# Patient Record
Sex: Male | Born: 1965
Health system: Southern US, Community
[De-identification: ages and names within clinical notes are randomized; demographics above are authoritative.]

## PROBLEM LIST (undated history)

## (undated) DIAGNOSIS — I10 Essential (primary) hypertension: Secondary | ICD-10-CM

## (undated) DIAGNOSIS — N189 Chronic kidney disease, unspecified: Secondary | ICD-10-CM

## (undated) DIAGNOSIS — E11610 Type 2 diabetes mellitus with diabetic neuropathic arthropathy: Secondary | ICD-10-CM

## (undated) DIAGNOSIS — G709 Myoneural disorder, unspecified: Secondary | ICD-10-CM

## (undated) DIAGNOSIS — M109 Gout, unspecified: Secondary | ICD-10-CM

## (undated) DIAGNOSIS — G4730 Sleep apnea, unspecified: Secondary | ICD-10-CM

## (undated) DIAGNOSIS — K859 Acute pancreatitis without necrosis or infection, unspecified: Secondary | ICD-10-CM

## (undated) DIAGNOSIS — E669 Obesity, unspecified: Secondary | ICD-10-CM

## (undated) DIAGNOSIS — N200 Calculus of kidney: Secondary | ICD-10-CM

## (undated) DIAGNOSIS — J40 Bronchitis, not specified as acute or chronic: Secondary | ICD-10-CM

## (undated) DIAGNOSIS — J45909 Unspecified asthma, uncomplicated: Secondary | ICD-10-CM

## (undated) DIAGNOSIS — E1142 Type 2 diabetes mellitus with diabetic polyneuropathy: Secondary | ICD-10-CM

## (undated) HISTORY — DX: Myoneural disorder, unspecified: G70.9

## (undated) HISTORY — PX: NO PAST SURGERIES: SHX2092

## (undated) HISTORY — DX: Unspecified asthma, uncomplicated: J45.909

---

## 1999-01-20 ENCOUNTER — Emergency Department (HOSPITAL_COMMUNITY): Admission: EM | Admit: 1999-01-20 | Discharge: 1999-01-20 | Payer: Self-pay | Admitting: Emergency Medicine

## 1999-06-02 ENCOUNTER — Emergency Department (HOSPITAL_COMMUNITY): Admission: EM | Admit: 1999-06-02 | Discharge: 1999-06-02 | Payer: Self-pay | Admitting: Internal Medicine

## 2000-08-02 ENCOUNTER — Emergency Department (HOSPITAL_COMMUNITY): Admission: EM | Admit: 2000-08-02 | Discharge: 2000-08-02 | Payer: Self-pay | Admitting: Emergency Medicine

## 2000-08-02 ENCOUNTER — Encounter: Payer: Self-pay | Admitting: Emergency Medicine

## 2002-08-25 ENCOUNTER — Emergency Department (HOSPITAL_COMMUNITY): Admission: EM | Admit: 2002-08-25 | Discharge: 2002-08-25 | Payer: Self-pay | Admitting: Emergency Medicine

## 2002-08-25 ENCOUNTER — Encounter: Payer: Self-pay | Admitting: Emergency Medicine

## 2003-07-01 ENCOUNTER — Emergency Department (HOSPITAL_COMMUNITY): Admission: EM | Admit: 2003-07-01 | Discharge: 2003-07-01 | Payer: Self-pay | Admitting: Emergency Medicine

## 2004-04-01 ENCOUNTER — Emergency Department (HOSPITAL_COMMUNITY): Admission: EM | Admit: 2004-04-01 | Discharge: 2004-04-01 | Payer: Self-pay | Admitting: Emergency Medicine

## 2005-01-06 ENCOUNTER — Emergency Department (HOSPITAL_COMMUNITY): Admission: EM | Admit: 2005-01-06 | Discharge: 2005-01-06 | Payer: Self-pay | Admitting: Emergency Medicine

## 2009-02-22 ENCOUNTER — Emergency Department (HOSPITAL_COMMUNITY): Admission: EM | Admit: 2009-02-22 | Discharge: 2009-02-22 | Payer: Self-pay | Admitting: Emergency Medicine

## 2009-10-22 ENCOUNTER — Observation Stay (HOSPITAL_COMMUNITY): Admission: EM | Admit: 2009-10-22 | Discharge: 2009-10-23 | Payer: Self-pay | Admitting: Emergency Medicine

## 2009-10-22 ENCOUNTER — Emergency Department (HOSPITAL_COMMUNITY): Admission: EM | Admit: 2009-10-22 | Discharge: 2009-10-22 | Payer: Self-pay | Admitting: Family Medicine

## 2010-02-28 ENCOUNTER — Emergency Department (HOSPITAL_COMMUNITY): Admission: EM | Admit: 2010-02-28 | Discharge: 2010-02-28 | Payer: Self-pay | Admitting: Emergency Medicine

## 2010-06-27 ENCOUNTER — Emergency Department (HOSPITAL_COMMUNITY): Admission: EM | Admit: 2010-06-27 | Discharge: 2010-06-27 | Payer: Self-pay | Admitting: Family Medicine

## 2010-10-20 LAB — CBC
HCT: 35.4 % — ABNORMAL LOW (ref 39.0–52.0)
Hemoglobin: 12.3 g/dL — ABNORMAL LOW (ref 13.0–17.0)
Platelets: 215 10*3/uL (ref 150–400)
RBC: 3.97 MIL/uL — ABNORMAL LOW (ref 4.22–5.81)
WBC: 18 10*3/uL — ABNORMAL HIGH (ref 4.0–10.5)

## 2010-10-20 LAB — BASIC METABOLIC PANEL
BUN: 14 mg/dL (ref 6–23)
Potassium: 4 mEq/L (ref 3.5–5.1)

## 2010-10-20 LAB — DIFFERENTIAL
Eosinophils Absolute: 0.1 10*3/uL (ref 0.0–0.7)
Lymphocytes Relative: 14 % (ref 12–46)
Lymphs Abs: 2.5 10*3/uL (ref 0.7–4.0)

## 2010-10-20 LAB — GLUCOSE, CAPILLARY: Glucose-Capillary: 277 mg/dL — ABNORMAL HIGH (ref 70–99)

## 2011-01-22 ENCOUNTER — Emergency Department (HOSPITAL_COMMUNITY)
Admission: EM | Admit: 2011-01-22 | Discharge: 2011-01-22 | Disposition: A | Discharge status: Home / Self Care | Payer: 59 | Attending: Emergency Medicine | Admitting: Emergency Medicine

## 2011-01-22 ENCOUNTER — Emergency Department (HOSPITAL_COMMUNITY): Payer: 59

## 2011-01-22 ENCOUNTER — Ambulatory Visit (INDEPENDENT_AMBULATORY_CARE_PROVIDER_SITE_OTHER): Payer: 59

## 2011-01-22 ENCOUNTER — Inpatient Hospital Stay (INDEPENDENT_AMBULATORY_CARE_PROVIDER_SITE_OTHER)
Admission: RE | Admit: 2011-01-22 | Discharge: 2011-01-22 | Disposition: A | Discharge status: Home / Self Care | Payer: 59 | Source: Ambulatory Visit | Attending: Family Medicine | Admitting: Family Medicine

## 2011-01-22 DIAGNOSIS — R079 Chest pain, unspecified: Secondary | ICD-10-CM

## 2011-01-22 DIAGNOSIS — R0789 Other chest pain: Secondary | ICD-10-CM | POA: Insufficient documentation

## 2011-01-22 DIAGNOSIS — I10 Essential (primary) hypertension: Secondary | ICD-10-CM | POA: Insufficient documentation

## 2011-01-22 DIAGNOSIS — E1169 Type 2 diabetes mellitus with other specified complication: Secondary | ICD-10-CM | POA: Insufficient documentation

## 2011-01-22 LAB — TROPONIN I: Troponin I: <0.3 ng/mL (ref <0.30)

## 2011-01-22 LAB — CK TOTAL AND CKMB (NOT AT ARMC)
CK, MB: 4.9 ng/mL — ABNORMAL HIGH (ref 0.3–4.0)
Relative Index: 1.9 (ref 0.0–2.5)
Total CK: 263 U/L — ABNORMAL HIGH (ref 7–232)

## 2011-01-22 LAB — DIFFERENTIAL
Basophils Absolute: 0 10*3/uL (ref 0.0–0.1)
Lymphocytes Relative: 45 % (ref 12–46)
Lymphs Abs: 5.1 10*3/uL — ABNORMAL HIGH (ref 0.7–4.0)
Monocytes Absolute: 0.6 10*3/uL (ref 0.1–1.0)
Neutro Abs: 5.3 10*3/uL (ref 1.7–7.7)
Neutrophils Relative %: 47 % (ref 43–77)

## 2011-01-22 LAB — BASIC METABOLIC PANEL
Calcium: 9.5 mg/dL (ref 8.4–10.5)
Creatinine, Ser: 0.95 mg/dL (ref 0.50–1.35)
GFR calc non Af Amer: >60 mL/min (ref >60)
Glucose, Bld: 333 mg/dL — ABNORMAL HIGH (ref 70–99)
Potassium: 3.7 mEq/L (ref 3.5–5.1)

## 2011-01-22 LAB — CBC
HCT: 43 % (ref 39.0–52.0)
Hemoglobin: 15.5 g/dL (ref 13.0–17.0)
MCV: 85 fL (ref 78.0–100.0)
RDW: 13.4 % (ref 11.5–15.5)

## 2012-04-19 ENCOUNTER — Emergency Department (INDEPENDENT_AMBULATORY_CARE_PROVIDER_SITE_OTHER): Payer: 59

## 2012-04-19 ENCOUNTER — Encounter (HOSPITAL_COMMUNITY): Payer: Self-pay | Admitting: *Deleted

## 2012-04-19 ENCOUNTER — Emergency Department (HOSPITAL_COMMUNITY)
Admission: EM | Admit: 2012-04-19 | Discharge: 2012-04-19 | Disposition: A | Discharge status: Home / Self Care | Payer: 59 | Source: Home / Self Care | Attending: Emergency Medicine | Admitting: Emergency Medicine

## 2012-04-19 DIAGNOSIS — M109 Gout, unspecified: Secondary | ICD-10-CM

## 2012-04-19 DIAGNOSIS — M7650 Patellar tendinitis, unspecified knee: Secondary | ICD-10-CM

## 2012-04-19 DIAGNOSIS — I10 Essential (primary) hypertension: Secondary | ICD-10-CM

## 2012-04-19 HISTORY — DX: Gout, unspecified: M10.9

## 2012-04-19 HISTORY — DX: Essential (primary) hypertension: I10

## 2012-04-19 MED ORDER — COLCHICINE 0.6 MG PO TABS: ORAL_TABLET | ORAL | Status: DC

## 2012-04-19 MED ORDER — PREDNISONE 5 MG PO KIT: 1.0000 | PACK | Freq: Every day | ORAL | Status: DC

## 2012-04-19 MED ORDER — LISINOPRIL-HYDROCHLOROTHIAZIDE 20-25 MG PO TABS: 1.0000 | ORAL_TABLET | Freq: Every day | ORAL | Status: DC

## 2012-04-19 NOTE — ED Notes (Signed)
16  Inch    Knee  Immobilizer  Applied    

## 2012-04-19 NOTE — ED Provider Notes (Signed)
Chief Complaint  Patient presents with  . Knee Pain    History of Present Illness:   Troy [name] is a 46 year old male with diabetes, gout, and hypertension. He presents today with left knee pain, however his hypertension also needs to be addressed.  The knee pain began yesterday evening. He denies any injury. The pain is localized to just above the patella. It seems to be somewhat swollen and has had a little bit of crepitus. He has pain on flexion of the knee and cannot flex it more than about 30. He also has pain when lying flat, going up steps or down steps, but not with walking on level ground. He says the pain gets better if he stands up. The pain is described as a constant dull ache rated 10 over 10 last night but now is a 6/10. He injured this knee a few years ago, falling down some steps. He's had it checked out and there were no fractures, but he has had flareups of similar pain off and on since that. He denies any obvious precipitating factors except he's been on his feet a lot. He has a history of gout and is not taking anything for it right now and  He also has a history of high blood pressure and diabetes. He's supposed to be on Benicar/HCTZ, but has been unable to afford this because of its expense. He would like to try something less expensive. He plans to switch to a new primary care doctor. He denies headaches, dizziness, blurry vision, shortness of breath, or chest pain.  Review of Systems:  Other than noted above, the patient denies any of the following symptoms: Systemic:  No fevers, chills, sweats, or aches.  No fatigue or tiredness. Musculoskeletal:  No joint pain, arthritis, bursitis, swelling, back pain, or neck pain. Neurological:  No muscular weakness, paresthesias, headache, or trouble with speech or coordination.  No dizziness.  PMFSH:  Past medical history, family history, social history, meds, and allergies were reviewed.  Physical Exam:   Vital signs:  BP 194/119   Pulse 94  Temp 98.5 F (36.9 C) (Oral)  Resp 20  SpO2 100% Gen:  Alert and oriented times 3.  In no distress. Musculoskeletal: He has a soft tissue nodule that may be scar tissue or a cyst at the lower pole of the patella. There is pain to the upper pole of patella. No obvious effusion. There is no pain over the medial or lateral joint line. He does not have any erythema or warmth. He can only flex his knee to about 30. McMurray's sign was impossible to do, Lachman's sign was negative, anterior drawer sign was negative, varus and all the stress were negative. Otherwise, all joints had a full a ROM with no swelling, bruising or deformity.  No edema, pulses full. Extremities were warm and pink.  Capillary refill was brisk.  Skin:  Clear, warm and dry.  No rash. Neuro:  Alert and oriented times 3.  Muscle strength was normal.  Sensation was intact to light touch.     Radiology:  Dg Knee 2 Views Left  04/19/2012  *RADIOLOGY REPORT*  Clinical Data: Left knee pain for 24 hours without injury.  Pain is superior to the patella.  LEFT KNEE - 1-2 VIEW  Comparison: None.  Findings: Enthesopathic change at the quadriceps insertion and patellar tendon origin.  There is soft tissue swelling about the lower pole of the patella anteriorly. No joint effusion.  Suspect minimal degenerative irregularity about  the tibial spines on the AP view.  IMPRESSION: Prepatellar soft tissue swelling, nonspecific. No acute osseous abnormality.   Original Report Authenticated By: Consuello Bossier, M.D.    I reviewed the images independently and personally and concur with the radiologist's findings.  Course in Urgent Care Center:   The patient was placed in a knee immobilizer.  Assessment:  The primary encounter diagnosis was Patellar tendonitis. Diagnoses of Gout and Hypertension were also pertinent to this visit.  Differential diagnosis includes gout or patellar tendinitis. We'll go ahead and treat with colchicine and  prednisone. Suggest he return if no better in one week.  His blood pressure is also markedly elevated today and he needs to get back on medication. He states he cannot afford the Benicar/HCTZ, so I gave him a lisinopril HCTZ instead. I her symptoms followup with her primary care physician within the next one to 2 weeks.  Plan:   1.  The following meds were prescribed:   New Prescriptions   COLCHICINE 0.6 MG TABLET    Take 2 now and 1 in 1 hour.  May repeat dose once daily.  For gout attack.   LISINOPRIL-HYDROCHLOROTHIAZIDE (PRINZIDE,ZESTORETIC) 20-25 MG PER TABLET    Take 1 tablet by mouth daily.   PREDNISONE 5 MG KIT    Take 1 kit (5 mg total) by mouth daily after breakfast. Prednisone 5 mg 6 day dosepack.  Take as directed.   2.  The patient was instructed in symptomatic care, including rest and activity, elevation, application of ice and compression.  Appropriate handouts were given. 3.  The patient was told to return if becoming worse in any way, if no better in 3 or 4 days, and given some red flag symptoms that would indicate earlier return.   4.  The patient was told to follow up with a primary care physician as soon as possible.   Reuben Likes, MD 04/19/12 [phone]

## 2012-04-19 NOTE — ED Notes (Signed)
Pt  Reports  Pain /  Swelling  Of  The  l  Knee     He  denys  Any  specefic  Injury          He  Has  A  History  Of  The  Gout        He    Reports  He  Has  High  bp  But  does  Not take  meds       Due  To  Finances           He  Also  Has    History                 Diabetes  He take s meds  For  Same

## 2012-07-15 ENCOUNTER — Encounter (INDEPENDENT_AMBULATORY_CARE_PROVIDER_SITE_OTHER): Payer: 59 | Admitting: Ophthalmology

## 2012-08-11 ENCOUNTER — Encounter (INDEPENDENT_AMBULATORY_CARE_PROVIDER_SITE_OTHER): Payer: 59 | Admitting: Ophthalmology

## 2012-08-12 ENCOUNTER — Encounter (INDEPENDENT_AMBULATORY_CARE_PROVIDER_SITE_OTHER): Payer: 59 | Admitting: Ophthalmology

## 2012-08-12 DIAGNOSIS — H3581 Retinal edema: Secondary | ICD-10-CM

## 2012-08-12 DIAGNOSIS — E1139 Type 2 diabetes mellitus with other diabetic ophthalmic complication: Secondary | ICD-10-CM

## 2012-08-12 DIAGNOSIS — I10 Essential (primary) hypertension: Secondary | ICD-10-CM

## 2012-08-12 DIAGNOSIS — E1165 Type 2 diabetes mellitus with hyperglycemia: Secondary | ICD-10-CM

## 2012-08-12 DIAGNOSIS — E11319 Type 2 diabetes mellitus with unspecified diabetic retinopathy without macular edema: Secondary | ICD-10-CM

## 2012-08-12 DIAGNOSIS — H35039 Hypertensive retinopathy, unspecified eye: Secondary | ICD-10-CM

## 2012-08-12 DIAGNOSIS — H2510 Age-related nuclear cataract, unspecified eye: Secondary | ICD-10-CM

## 2012-08-12 DIAGNOSIS — H43819 Vitreous degeneration, unspecified eye: Secondary | ICD-10-CM

## 2012-09-02 ENCOUNTER — Ambulatory Visit (INDEPENDENT_AMBULATORY_CARE_PROVIDER_SITE_OTHER): Payer: 59 | Admitting: Ophthalmology

## 2013-07-13 ENCOUNTER — Other Ambulatory Visit (HOSPITAL_COMMUNITY): Payer: Self-pay | Admitting: Internal Medicine

## 2013-07-13 ENCOUNTER — Ambulatory Visit (HOSPITAL_COMMUNITY)
Admission: RE | Admit: 2013-07-13 | Discharge: 2013-07-13 | Disposition: A | Discharge status: Home / Self Care | Payer: BC Managed Care – PPO | Source: Ambulatory Visit | Attending: Internal Medicine | Admitting: Internal Medicine

## 2013-07-13 DIAGNOSIS — R52 Pain, unspecified: Secondary | ICD-10-CM

## 2013-07-13 DIAGNOSIS — J986 Disorders of diaphragm: Secondary | ICD-10-CM | POA: Insufficient documentation

## 2013-07-13 DIAGNOSIS — J984 Other disorders of lung: Secondary | ICD-10-CM | POA: Insufficient documentation

## 2013-07-13 DIAGNOSIS — R05 Cough: Secondary | ICD-10-CM | POA: Insufficient documentation

## 2013-07-13 DIAGNOSIS — R059 Cough, unspecified: Secondary | ICD-10-CM | POA: Insufficient documentation

## 2013-07-13 DIAGNOSIS — J42 Unspecified chronic bronchitis: Secondary | ICD-10-CM | POA: Insufficient documentation

## 2013-07-18 ENCOUNTER — Inpatient Hospital Stay (HOSPITAL_COMMUNITY)
Admission: EM | Admit: 2013-07-18 | Discharge: 2013-07-20 | DRG: 193 | Disposition: A | Discharge status: Home / Self Care | Payer: BC Managed Care – PPO | Attending: Internal Medicine | Admitting: Internal Medicine

## 2013-07-18 ENCOUNTER — Emergency Department (HOSPITAL_COMMUNITY): Payer: BC Managed Care – PPO

## 2013-07-18 ENCOUNTER — Encounter (HOSPITAL_COMMUNITY): Payer: Self-pay | Admitting: Emergency Medicine

## 2013-07-18 DIAGNOSIS — Z79899 Other long term (current) drug therapy: Secondary | ICD-10-CM

## 2013-07-18 DIAGNOSIS — E119 Type 2 diabetes mellitus without complications: Secondary | ICD-10-CM

## 2013-07-18 DIAGNOSIS — I10 Essential (primary) hypertension: Secondary | ICD-10-CM

## 2013-07-18 DIAGNOSIS — J984 Other disorders of lung: Secondary | ICD-10-CM

## 2013-07-18 DIAGNOSIS — R0603 Acute respiratory distress: Secondary | ICD-10-CM

## 2013-07-18 DIAGNOSIS — J9600 Acute respiratory failure, unspecified whether with hypoxia or hypercapnia: Secondary | ICD-10-CM | POA: Diagnosis present

## 2013-07-18 DIAGNOSIS — M109 Gout, unspecified: Secondary | ICD-10-CM | POA: Diagnosis present

## 2013-07-18 DIAGNOSIS — Z23 Encounter for immunization: Secondary | ICD-10-CM

## 2013-07-18 DIAGNOSIS — J189 Pneumonia, unspecified organism: Principal | ICD-10-CM

## 2013-07-18 DIAGNOSIS — R6510 Systemic inflammatory response syndrome (SIRS) of non-infectious origin without acute organ dysfunction: Secondary | ICD-10-CM

## 2013-07-18 HISTORY — DX: Bronchitis, not specified as acute or chronic: J40

## 2013-07-18 LAB — INFLUENZA PANEL BY PCR (TYPE A & B)
H1N1 flu by pcr: NOT DETECTED
Influenza B By PCR: NEGATIVE

## 2013-07-18 LAB — CBC WITH DIFFERENTIAL/PLATELET
Eosinophils Absolute: 1.4 10*3/uL — ABNORMAL HIGH (ref 0.0–0.7)
Eosinophils Relative: 13 % — ABNORMAL HIGH (ref 0–5)
Hemoglobin: 16.1 g/dL (ref 13.0–17.0)
Lymphocytes Relative: 43 % (ref 12–46)
Lymphs Abs: 4.6 10*3/uL — ABNORMAL HIGH (ref 0.7–4.0)
MCH: 31.6 pg (ref 26.0–34.0)
MCV: 87.8 fL (ref 78.0–100.0)
Monocytes Relative: 5 % (ref 3–12)
Neutro Abs: 4 10*3/uL (ref 1.7–7.7)
Neutrophils Relative %: 38 % — ABNORMAL LOW (ref 43–77)
Platelets: 285 10*3/uL (ref 150–400)
RBC: 5.09 MIL/uL (ref 4.22–5.81)
WBC: 10.6 10*3/uL — ABNORMAL HIGH (ref 4.0–10.5)

## 2013-07-18 LAB — POCT I-STAT 3, ART BLOOD GAS (G3+)
Acid-base deficit: 3 mmol/L — ABNORMAL HIGH (ref 0.0–2.0)
Bicarbonate: 23.5 mEq/L (ref 20.0–24.0)
TCO2: 25 mmol/L (ref 0–100)
pCO2 arterial: 44.4 mmHg (ref 35.0–45.0)
pO2, Arterial: 61 mmHg — ABNORMAL LOW (ref 80.0–100.0)

## 2013-07-18 LAB — COMPREHENSIVE METABOLIC PANEL
AST: 20 U/L (ref 0–37)
Albumin: 4 g/dL (ref 3.5–5.2)
Alkaline Phosphatase: 71 U/L (ref 39–117)
BUN: 21 mg/dL (ref 6–23)
Calcium: 9.6 mg/dL (ref 8.4–10.5)
Creatinine, Ser: 0.96 mg/dL (ref 0.50–1.35)
GFR calc Af Amer: >90 mL/min (ref >90)
Total Bilirubin: 0.3 mg/dL (ref 0.3–1.2)

## 2013-07-18 LAB — POCT I-STAT TROPONIN I
Troponin i, poc: 0.01 ng/mL (ref 0.00–0.08)
Troponin i, poc: 0 ng/mL (ref 0.00–0.08)

## 2013-07-18 LAB — GLUCOSE, CAPILLARY
Glucose-Capillary: 264 mg/dL — ABNORMAL HIGH (ref 70–99)
Glucose-Capillary: 272 mg/dL — ABNORMAL HIGH (ref 70–99)
Glucose-Capillary: 310 mg/dL — ABNORMAL HIGH (ref 70–99)
Glucose-Capillary: 247 mg/dL — ABNORMAL HIGH (ref 70–99)
Glucose-Capillary: 208 mg/dL — ABNORMAL HIGH (ref 70–99)

## 2013-07-18 LAB — PRO B NATRIURETIC PEPTIDE: Pro B Natriuretic peptide (BNP): 29.7 pg/mL (ref 0–125)

## 2013-07-18 LAB — MRSA PCR SCREENING: MRSA by PCR: NEGATIVE

## 2013-07-18 MED ORDER — CEFTRIAXONE SODIUM 1 G IJ SOLR
1.0000 g | Freq: Once | INTRAMUSCULAR | Status: AC
  Administered 2013-07-18: 1 g via INTRAVENOUS
  Filled 2013-07-18: qty 10

## 2013-07-18 MED ORDER — INSULIN ASPART 100 UNIT/ML ~~LOC~~ SOLN
0.0000 [IU] | Freq: Three times a day (TID) | SUBCUTANEOUS | Status: DC
  Administered 2013-07-18: 5 [IU] via SUBCUTANEOUS
  Administered 2013-07-18: 11 [IU] via SUBCUTANEOUS
  Administered 2013-07-19: 5 [IU] via SUBCUTANEOUS
  Administered 2013-07-19: 3 [IU] via SUBCUTANEOUS
  Administered 2013-07-19: 2 [IU] via SUBCUTANEOUS

## 2013-07-18 MED ORDER — IPRATROPIUM BROMIDE 0.02 % IN SOLN
RESPIRATORY_TRACT | Status: AC
  Filled 2013-07-18: qty 2.5

## 2013-07-18 MED ORDER — DEXTROSE 5 % IV SOLN
500.0000 mg | Freq: Once | INTRAVENOUS | Status: AC
  Administered 2013-07-18: 500 mg via INTRAVENOUS

## 2013-07-18 MED ORDER — ALBUTEROL SULFATE (5 MG/ML) 0.5% IN NEBU
2.5000 mg | INHALATION_SOLUTION | RESPIRATORY_TRACT | Status: DC
  Administered 2013-07-18 – 2013-07-19 (×7): 2.5 mg via RESPIRATORY_TRACT
  Filled 2013-07-18 (×7): qty 0.5

## 2013-07-18 MED ORDER — LEVOFLOXACIN IN D5W 750 MG/150ML IV SOLN
750.0000 mg | INTRAVENOUS | Status: DC
  Administered 2013-07-18 – 2013-07-19 (×2): 750 mg via INTRAVENOUS
  Filled 2013-07-18 (×4): qty 150

## 2013-07-18 MED ORDER — VANCOMYCIN HCL 10 G IV SOLR
2500.0000 mg | Freq: Once | INTRAVENOUS | Status: AC
  Administered 2013-07-18: 2500 mg via INTRAVENOUS
  Filled 2013-07-18: qty 2500

## 2013-07-18 MED ORDER — PNEUMOCOCCAL VAC POLYVALENT 25 MCG/0.5ML IJ INJ: 0.5000 mL | INJECTION | Freq: Once | INTRAMUSCULAR | Status: DC

## 2013-07-18 MED ORDER — LEVOFLOXACIN IN D5W 750 MG/150ML IV SOLN
750.0000 mg | INTRAVENOUS | Status: DC
  Filled 2013-07-18 (×2): qty 150

## 2013-07-18 MED ORDER — ALBUTEROL SULFATE (5 MG/ML) 0.5% IN NEBU
2.5000 mg | INHALATION_SOLUTION | RESPIRATORY_TRACT | Status: DC | PRN
  Administered 2013-07-18: 2.5 mg via RESPIRATORY_TRACT
  Filled 2013-07-18: qty 0.5

## 2013-07-18 MED ORDER — VANCOMYCIN HCL IN DEXTROSE 1-5 GM/200ML-% IV SOLN
1000.0000 mg | Freq: Two times a day (BID) | INTRAVENOUS | Status: DC
  Administered 2013-07-18 – 2013-07-19 (×3): 1000 mg via INTRAVENOUS
  Filled 2013-07-18 (×6): qty 200

## 2013-07-18 MED ORDER — SODIUM CHLORIDE 0.9 % IV SOLN
INTRAVENOUS | Status: DC
  Administered 2013-07-18 – 2013-07-19 (×2): via INTRAVENOUS

## 2013-07-18 MED ORDER — IPRATROPIUM BROMIDE 0.02 % IN SOLN
0.5000 mg | Freq: Once | RESPIRATORY_TRACT | Status: AC
  Administered 2013-07-18: 0.5 mg via RESPIRATORY_TRACT

## 2013-07-18 MED ORDER — ALBUTEROL SULFATE (5 MG/ML) 0.5% IN NEBU
INHALATION_SOLUTION | RESPIRATORY_TRACT | Status: AC
  Filled 2013-07-18: qty 1

## 2013-07-18 MED ORDER — METHYLPREDNISOLONE SODIUM SUCC 125 MG IJ SOLR
125.0000 mg | Freq: Once | INTRAMUSCULAR | Status: AC
  Administered 2013-07-18: 125 mg via INTRAVENOUS
  Filled 2013-07-18: qty 2

## 2013-07-18 MED ORDER — ALBUTEROL SULFATE (5 MG/ML) 0.5% IN NEBU
5.0000 mg | INHALATION_SOLUTION | Freq: Once | RESPIRATORY_TRACT | Status: AC
  Administered 2013-07-18: 5 mg via RESPIRATORY_TRACT
  Filled 2013-07-18: qty 1

## 2013-07-18 MED ORDER — INFLUENZA VAC SPLIT QUAD 0.5 ML IM SUSP: 0.5000 mL | Freq: Once | INTRAMUSCULAR | Status: DC

## 2013-07-18 MED ORDER — ENOXAPARIN SODIUM 40 MG/0.4ML ~~LOC~~ SOLN
40.0000 mg | SUBCUTANEOUS | Status: DC
  Administered 2013-07-18 – 2013-07-19 (×2): 40 mg via SUBCUTANEOUS
  Filled 2013-07-18 (×3): qty 0.4

## 2013-07-18 MED ORDER — IPRATROPIUM BROMIDE 0.02 % IN SOLN
0.5000 mg | RESPIRATORY_TRACT | Status: DC
  Administered 2013-07-18 – 2013-07-19 (×7): 0.5 mg via RESPIRATORY_TRACT
  Filled 2013-07-18 (×7): qty 2.5

## 2013-07-18 MED ORDER — INSULIN ASPART 100 UNIT/ML ~~LOC~~ SOLN
0.0000 [IU] | Freq: Every day | SUBCUTANEOUS | Status: DC
  Administered 2013-07-18: 2 [IU] via SUBCUTANEOUS

## 2013-07-18 MED ORDER — ALBUTEROL SULFATE (5 MG/ML) 0.5% IN NEBU
5.0000 mg | INHALATION_SOLUTION | Freq: Once | RESPIRATORY_TRACT | Status: AC
  Administered 2013-07-18: 5 mg via RESPIRATORY_TRACT

## 2013-07-18 NOTE — ED Notes (Signed)
Dr. Lavella Lemons notified.

## 2013-07-18 NOTE — ED Notes (Signed)
RT and cxr paged.

## 2013-07-18 NOTE — Care Management Note (Signed)
    Page 1 of 1   07/18/2013     10:13:18 AM   CARE MANAGEMENT NOTE 07/18/2013  Patient:  [facility]   Account Number:  [id number]  Date Initiated:  07/18/2013  Documentation initiated by:  Junius Creamer  Subjective/Objective Assessment:   adm w pneumonis, sirs     Action/Plan:   lives w wife, pcp dr Concepcion Elk   Anticipated DC Date:     Anticipated DC Plan:           Choice offered to / List presented to:             Status of service:   Medicare Important Message given?   (If response is "NO", the following Medicare IM given date fields will be blank) Date Medicare IM given:   Date Additional Medicare IM given:    Discharge Disposition:    Per UR Regulation:  Reviewed for med. necessity/level of care/duration of stay  If discussed at Long Length of Stay Meetings, dates discussed:    Comments:

## 2013-07-18 NOTE — ED Provider Notes (Addendum)
CSN: [id number]     Arrival date & time 07/18/13  0212 History   First MD Initiated Contact with Patient 07/18/13 0222     Chief Complaint  Patient presents with  . Shortness of Breath   (Consider location/radiation/quality/duration/timing/severity/associated sxs/prior Treatment) HPI Is a 47 year old man with a history of hypertension and non-insulin-dependent diabetes. He presents with several days of a productive cough and shortness of breath which began today. The patient was found to have oxygen saturations of 84% on room air while being triaged. He denies fever and chest pain. He denies history of chronic respiratory illness. He is a nonsmoker. He denies cocaine use.    Past Medical History  Diagnosis Date  . Hypertension   . Diabetes mellitus   . Gout   . Bronchitis    History reviewed. No pertinent past surgical history. History reviewed. No pertinent family history. History  Substance Use Topics  . Smoking status: Never Smoker   . Smokeless tobacco: Not on file  . Alcohol Use: Yes    Review of Systems Ten point review of symptoms performed and is negative with the exception of symptoms noted above.   Allergies  Review of patient's allergies indicates not on file.  Home Medications   Current Outpatient Rx  Name  Route  Sig  Dispense  Refill  . colchicine 0.6 MG tablet      Take 2 now and 1 in 1 hour.  May repeat dose once daily.  For gout attack.   12 tablet   0   . lisinopril-hydrochlorothiazide (PRINZIDE,ZESTORETIC) 20-25 MG per tablet   Oral   Take 1 tablet by mouth daily.   30 tablet   0   . PredniSONE 5 MG KIT   Oral   Take 1 kit (5 mg total) by mouth daily after breakfast. Prednisone 5 mg 6 day dosepack.  Take as directed.   1 kit   0    BP 163/108  Pulse 101  Temp(Src) 97.7 F (36.5 C) (Oral)  Resp 21  SpO2 96% Physical Exam Gen: well developed and well nourished appearing, noted to be cachectic and tachycardic with mild distress Head:  NCAT Eyes: PERL, EOMI Nose: no epistaixis or rhinorrhea Mouth/throat: mucosa is moist and pink Neck: supple, no stridor Lungs: Respiratory rate is 32 times per minute, air exchange is quite diminished bilaterally, scattered high pitched wheezing bilaterally, accessory muscle use. CV: Rapid and regular, pulse 108 beats per minute good distal pulses.  Abd: Obese, soft, notender, nondistended Back: no ttp, no cva ttp Skin: warm and dry Ext: no edema, normal to inspection Neuro: CN ii-xii grossly intact, no focal deficits Psyche; mildly anxious affect,  calm and cooperative.  ED Course  Procedures (including critical care time) Labs Review  Results for orders placed during the hospital encounter of 07/18/13 (from the past 24 hour(s))  POCT I-STAT TROPONIN I     Status: None   Collection Time    07/18/13  3:19 AM      Result Value Range   Troponin i, poc 0.01  0.00 - 0.08 ng/mL   Comment 3           COMPREHENSIVE METABOLIC PANEL     Status: Abnormal   Collection Time    07/18/13  3:20 AM      Result Value Range   Sodium 136  135 - 145 mEq/L   Potassium 4.4  3.5 - 5.1 mEq/L   Chloride 97  96 - 112 mEq/L  CO2 25  19 - 32 mEq/L   Glucose, Bld 146 (*) 70 - 99 mg/dL   BUN 21  6 - 23 mg/dL   Creatinine, Ser 1.91  0.50 - 1.35 mg/dL   Calcium 9.6  8.4 - 47.8 mg/dL   Total Protein 8.9 (*) 6.0 - 8.3 g/dL   Albumin 4.0  3.5 - 5.2 g/dL   AST 20  0 - 37 U/L   ALT 25  0 - 53 U/L   Alkaline Phosphatase 71  39 - 117 U/L   Total Bilirubin 0.3  0.3 - 1.2 mg/dL   GFR calc non Af Amer >90  >90 mL/min   GFR calc Af Amer >90  >90 mL/min  CBC WITH DIFFERENTIAL     Status: Abnormal   Collection Time    07/18/13  3:20 AM      Result Value Range   WBC 10.6 (*) 4.0 - 10.5 K/uL   RBC 5.09  4.22 - 5.81 MIL/uL   Hemoglobin 16.1  13.0 - 17.0 g/dL   HCT 29.5  62.1 - 30.8 %   MCV 87.8  78.0 - 100.0 fL   MCH 31.6  26.0 - 34.0 pg   MCHC 36.0  30.0 - 36.0 g/dL   RDW 65.7  84.6 - 96.2 %   Platelets  285  150 - 400 K/uL   Neutrophils Relative % 38 (*) 43 - 77 %   Neutro Abs 4.0  1.7 - 7.7 K/uL   Lymphocytes Relative 43  12 - 46 %   Lymphs Abs 4.6 (*) 0.7 - 4.0 K/uL   Monocytes Relative 5  3 - 12 %   Monocytes Absolute 0.6  0.1 - 1.0 K/uL   Eosinophils Relative 13 (*) 0 - 5 %   Eosinophils Absolute 1.4 (*) 0.0 - 0.7 K/uL   Basophils Relative 0  0 - 1 %   Basophils Absolute 0.0  0.0 - 0.1 K/uL  LACTIC ACID, PLASMA     Status: None   Collection Time    07/18/13  3:20 AM      Result Value Range   Lactic Acid, Venous 1.9  0.5 - 2.2 mmol/L   DG Chest Portable 1 View (Final result)  Result time: 07/18/13 03:03:25    Final result by Rad Results In Interface (07/18/13 03:03:25)    Narrative:   CLINICAL DATA: Shortness of breath and decreased O2 saturation. Lung crackles.  EXAM: PORTABLE CHEST - 1 VIEW  COMPARISON: Chest radiograph performed 07/13/2013  FINDINGS: The lungs are well-aerated. Mild right perihilar opacity could reflect mild pneumonia. There is no evidence of pleural effusion or pneumothorax.  The cardiomediastinal silhouette is within normal limits. No acute osseous abnormalities are seen.  IMPRESSION: Mild right perihilar opacity could reflect mild pneumonia.   Electronically Signed By: Roanna Raider M.D. On: 07/18/2013 03:03     EKG Interpretation   None      CRITICAL CARE Performed by: Brandt Loosen   Total critical care time: 60m  Critical care time was exclusive of separately billable procedures and treating other patients.  Critical care was necessary to treat or prevent imminent or life-threatening deterioration.  Critical care was time spent personally by me on the following activities: development of treatment plan with patient and/or surrogate as well as nursing, discussions with consultants, evaluation of patient's response to treatment, examination of patient, obtaining history from patient or surrogate, ordering and performing  treatments and interventions, ordering and review of laboratory studies, ordering  and review of radiographic studies, pulse oximetry and re-evaluation of patient's condition.   MDM  Patient with bronchospasm and acute respiratory failure. We are treating with Albuterol, Atrovent and Solumedrol. We will obtain CBC, influenza pcr, cmp, blood cultures, lactic acid, CXR. Anticipate admission.   0448: Patient with persistent wheezing and continued oxygen requirement of 4L in order to maintain sats of 94%.  Hospitalist paged to request admission.     Brandt Loosen, MD 07/18/13 1610  Brandt Loosen, MD 07/18/13 [phone]

## 2013-07-18 NOTE — ED Notes (Addendum)
Pt states that 4 weeks ago he was diagnosed with bronchitis. Pt had a follow up chest xray on Wednesday and was told that everything was normal. This afternoon pt states that he became short of breath. Pt 84% on RA. 96% on 2L.

## 2013-07-18 NOTE — ED Notes (Addendum)
Pt at registration: sob, audible coarse wet crackles, SPO2 84% RA, HR 119, RR 26, increased wob, short phrases, taken straight to room by w/c. Pt mentions recent dx of bronchitis. Takes lisinopril. Pt not familiar with lasix. Denies fever.

## 2013-07-18 NOTE — Progress Notes (Signed)
ANTIBIOTIC CONSULT NOTE - INITIAL  Pharmacy Consult for Vancomycin Indication: rule out pneumonia  No Known Allergies  Patient Measurements: Height: 5\' 10"  (177.8 cm) Weight: 246 lb 11.1 oz (111.9 kg) IBW/kg (Calculated) : 73  Vital Signs: Temp: 98.1 F (36.7 C) (12/22 0815) Temp src: Oral (12/22 0815) BP: 143/91 mmHg (12/22 0815) Pulse Rate: 106 (12/22 0815) Intake/Output from previous day:   Intake/Output from this shift:    Labs:  Recent Labs  07/18/13 0320  WBC 10.6*  HGB 16.1  PLT 285  CREATININE 0.96   Estimated Creatinine Clearance: 116.6 ml/min (by C-G formula based on Cr of 0.96). No results found for this basename: VANCOTROUGH, VANCOPEAK, VANCORANDOM, GENTTROUGH, GENTPEAK, GENTRANDOM, TOBRATROUGH, TOBRAPEAK, TOBRARND, AMIKACINPEAK, AMIKACINTROU, AMIKACIN,  in the last 72 hours   Microbiology: No results found for this or any previous visit (from the past 720 hour(s)).  Medical History: Past Medical History  Diagnosis Date  . Hypertension   . Diabetes mellitus   . Gout   . Bronchitis     Medications:  Prescriptions prior to admission  Medication Sig Dispense Refill  . glimepiride (AMARYL) 4 MG tablet Take 4 mg by mouth daily with breakfast.      . lisinopril-hydrochlorothiazide (PRINZIDE,ZESTORETIC) 20-25 MG per tablet Take 1 tablet by mouth daily.  30 tablet  0  . metFORMIN (GLUCOPHAGE) 1000 MG tablet Take 1,000 mg by mouth 2 (two) times daily with a meal.      . pseudoephedrine-guaifenesin (MUCINEX D) 60-600 MG per tablet Take 2 tablets by mouth every 12 (twelve) hours.       Assessment: 47 yo M admitted 07/18/2013  With SOB and cough.  Pharmacy consulted to dose vancomycin.  PMH: HTN, DM  ID: CAP, Afeb, WBC 10.6  Goal of Therapy:  Vancomycin trough level 15-20 mcg/ml  Plan:  1. Vancomycin 2500 mg IV x 1 now then  2. Vancomycin 1 g IV q12h x 7 days  3. Follow up SCr, UOP, cultures, clinical course and adjust as clinically  indicated.   Bliss Tsang Christine Virginia Crews 07/18/2013,9:43 AM

## 2013-07-18 NOTE — Progress Notes (Signed)
[facility] PROGRESS NOTE  Troy [name] ZOX:[id number] DOB: 19-Mar-1966 DOA: 07/18/2013 PCP: Dorrene German, MD   Patient is seen examined; agree/cont current regimen; please see H&P for details;    [name]   [facility] Pager [phone]. If 7PM-7AM, please contact night-coverage at [email or web address], password [email or web address] 07/18/2013, 9:01 AM  LOS: 0 days

## 2013-07-18 NOTE — H&P (Signed)
[facility] History and Physical  Patient: Troy [name]  WUJ:[id number]  DOB: [birth year]  DOS: the patient was seen and examined on 07/18/2013 PCP: Dorrene German, MD  Chief Complaint: shortness of breath and cough  HPI: Troy [name] is a 47 y.o. male with Past medical history of hypertension, diabetes. The patient is coming from home. The patient presents with complaints of cough and shortness of breath. Around Thanksgiving he was complaining of runny nose cough and shortness of breath with yellowish sputum production for which he saw his PCP who placed him on oral azithromycin for suspected bronchitis. After that course the patient continues to have a similar symptoms without any improvement with low-grade fever therefore 2 weeks after the antibiotics he saw his PCP again and was given Mucinex and recently was given Robitussin. Despite that since last one week his symptoms have progressively worsened with increasing shortness of breath primarily on lying down with low-grade fever and cough he become tachypneic and he could hear himself wheezing having and therefore he came to the hospital. He denies any active smoking and exposure to fumes or chemicals. He denies any travel. He denies any sick contact. He denies any recent episodes of aspiration. He denies any change in his medications.  Review of Systems: as mentioned in the history of present illness.  A Comprehensive review of the other systems is negative.  Past Medical History  Diagnosis Date  . Hypertension   . Diabetes mellitus   . Gout   . Bronchitis    History reviewed. No pertinent past surgical history. Social History:  reports that he has never smoked. He does not have any smokeless tobacco history on file. He reports that he drinks alcohol. His drug history is not on file. Independent for most of his  ADL.  No Known Allergies  History reviewed. No pertinent family history.  Prior to Admission medications    Medication Sig Start Date End Date Taking? Authorizing Provider  glimepiride (AMARYL) 4 MG tablet Take 4 mg by mouth daily with breakfast.   Yes Historical Provider, MD  lisinopril-hydrochlorothiazide (PRINZIDE,ZESTORETIC) 20-25 MG per tablet Take 1 tablet by mouth daily. 04/19/12  Yes Reuben Likes, MD  metFORMIN (GLUCOPHAGE) 1000 MG tablet Take 1,000 mg by mouth 2 (two) times daily with a meal.   Yes Historical Provider, MD  pseudoephedrine-guaifenesin (MUCINEX D) 60-600 MG per tablet Take 2 tablets by mouth every 12 (twelve) hours.   Yes Historical Provider, MD    Physical Exam: Filed Vitals:   07/18/13 0220 07/18/13 0230 07/18/13 0400 07/18/13 0430  BP: 163/108 127/81 126/87 106/84  Pulse: 101 100 95 93  Temp: 97.7 F (36.5 C)     TempSrc: Oral     Resp: 21 19 19 18   SpO2: 96% 99% 93% 96%    General: Alert, Awake and Oriented to Time, Place and Person. Appear in marked distress Eyes: PERRL ENT: Oral Mucosa clear moist Neck: no JVD Cardiovascular: S1 and S2 Present, no Murmur, Peripheral Pulses Present Respiratory: Bilateral Air entry equal and Decreased, extensive rhonchi and Crackles, extensive expiratory  wheezes more prominent in the upper airway, no evidence of stridor  Abdomen: Bowel Sound Present, Soft and Non tender Skin: no Rash Extremities: no Pedal edema, no calf tenderness Neurologic: Grossly Unremarkable.  Labs on Admission:  CBC:  Recent Labs Lab 07/18/13 0320  WBC 10.6*  NEUTROABS 4.0  HGB 16.1  HCT 44.7  MCV 87.8  PLT 285    CMP  Component Value Date/Time   NA 136 07/18/2013 0320   K 4.4 07/18/2013 0320   CL 97 07/18/2013 0320   CO2 25 07/18/2013 0320   GLUCOSE 146* 07/18/2013 0320   BUN 21 07/18/2013 0320   CREATININE 0.96 07/18/2013 0320   CALCIUM 9.6 07/18/2013 0320   PROT 8.9* 07/18/2013 0320   ALBUMIN 4.0 07/18/2013 0320   AST 20 07/18/2013 0320   ALT 25 07/18/2013 0320   ALKPHOS 71 07/18/2013 0320   BILITOT 0.3 07/18/2013 0320    GFRNONAA >90 07/18/2013 0320   GFRAA >90 07/18/2013 0320    No results found for this basename: LIPASE, AMYLASE,  in the last 168 hours No results found for this basename: AMMONIA,  in the last 168 hours  No results found for this basename: CKTOTAL, CKMB, CKMBINDEX, TROPONINI,  in the last 168 hours BNP (last 3 results)  Recent Labs  07/18/13 0216  PROBNP 29.7    Radiological Exams on Admission: Dg Chest Portable 1 View  07/18/2013   CLINICAL DATA:  Shortness of breath and decreased O2 saturation. Lung crackles.  EXAM: PORTABLE CHEST - 1 VIEW  COMPARISON:  Chest radiograph performed 07/13/2013  FINDINGS: The lungs are well-aerated. Mild right perihilar opacity could reflect mild pneumonia. There is no evidence of pleural effusion or pneumothorax.  The cardiomediastinal silhouette is within normal limits. No acute osseous abnormalities are seen.  IMPRESSION: Mild right perihilar opacity could reflect mild pneumonia.   Electronically Signed   By: Roanna Raider M.D.   On: 07/18/2013 03:03   Assessment/Plan Principal Problem:   CAP (community acquired pneumonia) Active Problems:   Hypertension   Diabetes   1. CAP (community acquired pneumonia)  the patient is presenting with complaints of cough and shortness of breath. His chest x-rays as to a possible pneumonia on the right hilar region. But he does appear to have extensive wheezes with severe respiratory distress and is requiring oxygen. For this he will be admitted to step down unit at present her initial monitoring. Considering his history of recent antibiotic use and symptoms that pertaining to possible influenza infection he will be given community acquired MRSA coverage with IV vancomycin and IV levofloxacin. Blood cultures are ordered urine antigens sputum culture and influenza PCR is pending. Continue on DuoNeb. He has received a dose of IV Solu-Medrol although will wait for the influenza PCR as the evidence suggest against  early use of steroids and possibly influenza. I will hydrate him with IV fluids. I get a blood gas to further evaluate oxygenation and ventilation.  2. Hypertension Holding antihypertensives at present  3. diabetes mellitus  Patient placing the patient on sliding scale with insulin  DVT Prophylaxis: subcutaneous Heparin Nutrition:  cardiac and diabetic diet   Code Status: Full   Disposition: Admitted to inpatient in step-down unit.  Author: Lynden Oxford, MD [facility] Pager: [phone] 07/18/2013, 6:30 AM    If 7PM-7AM, please contact night-coverage [email or web address] Password [email or web address]

## 2013-07-19 LAB — LEGIONELLA ANTIGEN, URINE: Legionella Antigen, Urine: NEGATIVE

## 2013-07-19 LAB — GLUCOSE, CAPILLARY
Glucose-Capillary: 158 mg/dL — ABNORMAL HIGH (ref 70–99)
Glucose-Capillary: 203 mg/dL — ABNORMAL HIGH (ref 70–99)
Glucose-Capillary: 177 mg/dL — ABNORMAL HIGH (ref 70–99)

## 2013-07-19 LAB — HEMOGLOBIN A1C: Hgb A1c MFr Bld: 11 % — ABNORMAL HIGH (ref <5.7)

## 2013-07-19 MED ORDER — IPRATROPIUM BROMIDE 0.02 % IN SOLN: 0.5000 mg | Freq: Three times a day (TID) | RESPIRATORY_TRACT | Status: DC

## 2013-07-19 MED ORDER — ALBUTEROL SULFATE (5 MG/ML) 0.5% IN NEBU
2.5000 mg | INHALATION_SOLUTION | Freq: Three times a day (TID) | RESPIRATORY_TRACT | Status: DC
  Administered 2013-07-19 – 2013-07-20 (×2): 2.5 mg via RESPIRATORY_TRACT
  Filled 2013-07-19 (×2): qty 0.5

## 2013-07-19 MED ORDER — IPRATROPIUM BROMIDE 0.02 % IN SOLN
0.5000 mg | Freq: Three times a day (TID) | RESPIRATORY_TRACT | Status: DC
  Administered 2013-07-20: 0.5 mg via RESPIRATORY_TRACT
  Filled 2013-07-19: qty 2.5

## 2013-07-19 MED ORDER — ALBUTEROL SULFATE (5 MG/ML) 0.5% IN NEBU: 2.5000 mg | INHALATION_SOLUTION | RESPIRATORY_TRACT | Status: DC

## 2013-07-19 NOTE — Progress Notes (Signed)
Report called to [facility], Charity fundraiser. Belongings and chart sent with patient. Pt taken in wheelchair. Pt denies any complaints at this time.  Dawson Bills, RN

## 2013-07-19 NOTE — Progress Notes (Signed)
BP elevated. 150/101. Recheck 130/90. Home meds held on admission. Dr. York Spaniel notifified. Call if SBP >160. Continue to monitor.

## 2013-07-19 NOTE — Progress Notes (Signed)
[facility] PROGRESS NOTE  Troy [name] UJW:[id number] DOB: 1966/01/16 DOA: 07/18/2013 PCP: Dorrene German, MD  Assessment/Plan: 47 y.o. male with Past medical history of hypertension, diabetes. The patient is coming from home. The patient presentswith complaints of cough and shortness of breath. Around Thanksgiving he was complaining of runny nose cough and shortness of breath with yellowish sputum production for which he saw his PCP who placed him on oral azithromycin for suspected bronchitis. After that course the patient continues to have a similar symptoms without any improvement with low-grade fever therefore 2 weeks after the antibiotics he saw his PCP again and was given Mucinex and recently was given Robitussin. Despite that since last one week his symptoms have progressively worsened with increasing shortness of breath primarily on lying down with low-grade fever and cough   1. Pneumonia failed outpatient atx;  -clinically improving; tachycardia, tachypnea improving; cont IV atx; blood c/s NTD  2. HTN ; Holding antihypertensives at present due to low BP  3. diabetes mellitus no recent HA1C;  Patient placing the patient on sliding scale with insulin, check a1c  SDU TF to RNF on 12/23 Code Status: full Family Communication: d/w patient  (indicate person spoken with, relationship, and if by phone, the number) Disposition Plan: home in 24-48 hours    Consultants:  None   Procedures:  None   Antibiotics:  Levofloxacin 12/22<<<<   vanc 12/22<<<<<(indicate start date, and stop date if known)  HPI/Subjective: alert  Objective: Filed Vitals:   07/19/13 0807  BP: 129/85  Pulse: 95  Temp: 97.8 F (36.6 C)  Resp: 16    Intake/Output Summary (Last 24 hours) at 07/19/13 1003 Last data filed at 07/19/13 0912  Gross per 24 hour  Intake   2540 ml  Output    500 ml  Net   2040 ml   Filed Weights   07/18/13 0809 07/19/13 0531  Weight: 111.9 kg (246 lb 11.1  oz) 113.3 kg (249 lb 12.5 oz)    Exam:   General:  alert  Cardiovascular: S1.,s2 rrr  Respiratory:Rales improving   Abdomen: soft, nt, nd   Musculoskeletal: no LE edema   Data Reviewed: Basic Metabolic Panel:  Recent Labs Lab 07/18/13 0320  NA 136  K 4.4  CL 97  CO2 25  GLUCOSE 146*  BUN 21  CREATININE 0.96  CALCIUM 9.6   Liver Function Tests:  Recent Labs Lab 07/18/13 0320  AST 20  ALT 25  ALKPHOS 71  BILITOT 0.3  PROT 8.9*  ALBUMIN 4.0   No results found for this basename: LIPASE, AMYLASE,  in the last 168 hours No results found for this basename: AMMONIA,  in the last 168 hours CBC:  Recent Labs Lab 07/18/13 0320  WBC 10.6*  NEUTROABS 4.0  HGB 16.1  HCT 44.7  MCV 87.8  PLT 285   Cardiac Enzymes: No results found for this basename: CKTOTAL, CKMB, CKMBINDEX, TROPONINI,  in the last 168 hours BNP (last 3 results)  Recent Labs  07/18/13 0216  PROBNP 29.7   CBG:  Recent Labs Lab 07/18/13 1216 07/18/13 1631 07/18/13 1957 07/18/13 2141 07/19/13 0807  GLUCAP 272* 310* 247* 208* 158*    Recent Results (from the past 240 hour(s))  CULTURE, BLOOD (ROUTINE X 2)     Status: None   Collection Time    07/18/13  3:20 AM      Result Value Range Status   Specimen Description BLOOD RIGHT FOREARM   Final   Special  Requests BOTTLES DRAWN AEROBIC AND ANAEROBIC [facility] EACH   Final   Culture  Setup Time     Final   Value: 07/18/2013 09:22     Performed at [facility]   Culture     Final   Value:        BLOOD CULTURE RECEIVED NO GROWTH TO DATE CULTURE WILL BE HELD FOR 5 DAYS BEFORE ISSUING A FINAL NEGATIVE REPORT     Performed at [facility]   Report Status PENDING   Incomplete  CULTURE, BLOOD (ROUTINE X 2)     Status: None   Collection Time    07/18/13  3:20 AM      Result Value Range Status   Specimen Description BLOOD RIGHT HAND   Final   Special Requests BOTTLES DRAWN AEROBIC AND ANAEROBIC 2.5CC EACH   Final   Culture   Setup Time     Final   Value: 07/18/2013 09:21     Performed at [facility]   Culture     Final   Value:        BLOOD CULTURE RECEIVED NO GROWTH TO DATE CULTURE WILL BE HELD FOR 5 DAYS BEFORE ISSUING A FINAL NEGATIVE REPORT     Performed at [facility]   Report Status PENDING   Incomplete  MRSA PCR SCREENING     Status: None   Collection Time    07/18/13  8:42 AM      Result Value Range Status   MRSA by PCR NEGATIVE  NEGATIVE Final   Comment:            The GeneXpert MRSA Assay (FDA     approved for NASAL specimens     only), is one component of a     comprehensive MRSA colonization     surveillance program. It is not     intended to diagnose MRSA     infection nor to guide or     monitor treatment for     MRSA infections.     Studies: Dg Chest Portable 1 View  07/18/2013   CLINICAL DATA:  Shortness of breath and decreased O2 saturation. Lung crackles.  EXAM: PORTABLE CHEST - 1 VIEW  COMPARISON:  Chest radiograph performed 07/13/2013  FINDINGS: The lungs are well-aerated. Mild right perihilar opacity could reflect mild pneumonia. There is no evidence of pleural effusion or pneumothorax.  The cardiomediastinal silhouette is within normal limits. No acute osseous abnormalities are seen.  IMPRESSION: Mild right perihilar opacity could reflect mild pneumonia.   Electronically Signed   By: Roanna Raider M.D.   On: 07/18/2013 03:03    Scheduled Meds: . ipratropium  0.5 mg Nebulization Q4H   And  . albuterol  2.5 mg Nebulization Q4H  . enoxaparin (LOVENOX) injection  40 mg Subcutaneous Q24H  . [START ON 07/21/2013] influenza vac split quadrivalent PF  0.5 mL Intramuscular Once  . insulin aspart  0-15 Units Subcutaneous TID WC  . insulin aspart  0-5 Units Subcutaneous QHS  . levofloxacin (LEVAQUIN) IV  750 mg Intravenous Q24H  . [START ON 07/21/2013] pneumococcal 23 valent vaccine  0.5 mL Intramuscular Once  . vancomycin  1,000 mg Intravenous Q12H   Continuous  Infusions: . sodium chloride 75 mL/hr at 07/19/13 0700    Principal Problem:   CAP (community acquired pneumonia) Active Problems:   Hypertension   Diabetes    Time spent: >35 minutes     Corynne Scibilia N  [facility] Pager [phone]. If 7PM-7AM, please contact night-coverage at [email or web address], password [facility] 07/19/2013, 10:03 AM  LOS: 1 day

## 2013-07-20 LAB — GLUCOSE, CAPILLARY: Glucose-Capillary: 118 mg/dL — ABNORMAL HIGH (ref 70–99)

## 2013-07-20 MED ORDER — INFLUENZA VAC SPLIT QUAD 0.5 ML IM SUSP
0.5000 mL | Freq: Once | INTRAMUSCULAR | Status: DC
  Filled 2013-07-20: qty 0.5

## 2013-07-20 MED ORDER — LEVOFLOXACIN 750 MG PO TABS: 750.0000 mg | ORAL_TABLET | Freq: Every day | ORAL | Status: DC

## 2013-07-20 MED ORDER — PNEUMOCOCCAL VAC POLYVALENT 25 MCG/0.5ML IJ INJ
0.5000 mL | INJECTION | Freq: Once | INTRAMUSCULAR | Status: DC
  Filled 2013-07-20: qty 0.5

## 2013-07-20 MED ORDER — ALBUTEROL SULFATE HFA 108 (90 BASE) MCG/ACT IN AERS: 2.0000 | INHALATION_SPRAY | Freq: Four times a day (QID) | RESPIRATORY_TRACT | Status: DC | PRN

## 2013-07-20 NOTE — Discharge Summary (Addendum)
Physician Discharge Summary  Troy [name]:[id number] DOB: 26-Nov-1965 DOA: 07/18/2013  PCP: Dorrene German, MD  Admit date: 07/18/2013 Discharge date: 07/20/2013  Time spent: >35 minutes  Recommendations for Outpatient Follow-up:  F/u with PCP next week  Discharge Diagnoses:  Principal Problem:   CAP (community acquired pneumonia) Active Problems:   Hypertension   Diabetes   Discharge Condition: stable   Diet recommendation: DM  Filed Weights   07/18/13 0809 07/19/13 0531 07/19/13 2012  Weight: 111.9 kg (246 lb 11.1 oz) 113.3 kg (249 lb 12.5 oz) 113.989 kg (251 lb 4.8 oz)    History of present illness:  47 y.o. male with Past medical history of hypertension, diabetes. The patient is coming from home. The patient presentswith complaints of cough and shortness of breath. Around Thanksgiving he was complaining of runny nose cough and shortness of breath with yellowish sputum production for which he saw his PCP who placed him on oral azithromycin for suspected bronchitis. After that course the patient continues to have a similar symptoms without any improvement with low-grade fever therefore 2 weeks after the antibiotics he saw his PCP again and was given Mucinex and recently was given Robitussin. Despite that since last one week his symptoms have progressively worsened with increasing shortness of breath primarily on lying down with low-grade fever and cough   Hospital Course:  1. Pneumonia failed outpatient atx;  -afebrile; clinically improved on IV atx; tachycardia, tachypnea resolved; cont PO atx; blood c/s NTD  2. HTN ; resume BP  meds 3. diabetes mellitus no recent HA1C-11; resume PO meds, outpatient titration, if uncontrolled may need insulin regimen     Procedures:  CXR (i.e. Studies not automatically included, echos, thoracentesis, etc; not x-rays)  Consultations:  Non e  Discharge Exam: Filed Vitals:   07/20/13 0805  BP: 151/97  Pulse: 109  Temp: 97.4  F (36.3 C)  Resp: 22    General: alert Cardiovascular: s1,s2 rrr Respiratory: wheezing improved   Discharge Instructions  Discharge Orders   Future Orders Complete By Expires   Diet - low sodium heart healthy  As directed    Discharge instructions  As directed    Comments:     Follow up with primary care doctor in 1 week   Increase activity slowly  As directed        Medication List         albuterol 108 (90 BASE) MCG/ACT inhaler  Commonly known as:  PROVENTIL HFA;VENTOLIN HFA  Inhale 2 puffs into the lungs every 6 (six) hours as needed for wheezing or shortness of breath.     glimepiride 4 MG tablet  Commonly known as:  AMARYL  Take 4 mg by mouth daily with breakfast.     levofloxacin 750 MG tablet  Commonly known as:  LEVAQUIN  Take 1 tablet (750 mg total) by mouth daily.     lisinopril-hydrochlorothiazide 20-25 MG per tablet  Commonly known as:  PRINZIDE,ZESTORETIC  Take 1 tablet by mouth daily.     metFORMIN 1000 MG tablet  Commonly known as:  GLUCOPHAGE  Take 1,000 mg by mouth 2 (two) times daily with a meal.     pseudoephedrine-guaifenesin 60-600 MG per tablet  Commonly known as:  MUCINEX D  Take 2 tablets by mouth every 12 (twelve) hours.       No Known Allergies     Follow-up Information   Follow up with AVBUERE,EDWIN A, MD In 1 week.   Specialty:  Internal Medicine  Contact information:   [address] [place] [address] [phone]        The results of significant diagnostics from this hospitalization (including imaging, microbiology, ancillary and laboratory) are listed below for reference.    Significant Diagnostic Studies: Dg Chest 2 View  07/13/2013   CLINICAL DATA:  Cough, chronic bronchitis  EXAM: CHEST  2 VIEW  COMPARISON:  01/22/2011; CT abdomen pelvis -04/01/2004  FINDINGS: Grossly unchanged borderline enlarged cardiac silhouette and mediastinal contours. Lung volumes remain reduced. There is persistent mild elevation  right hemidiaphragm. Grossly unchanged bilateral perihilar and left basilar / retrocardiac heterogeneous opacities. There is mild diffuse thickening of the pulmonary interstitium. No new focal airspace opacities. No pleural effusion or pneumothorax. No evidence of edema. Grossly unchanged bones.  IMPRESSION: Persistent findings of hypoventilation, perihilar and left basilar atelectasis and bronchitic change without definite acute cardiopulmonary disease.   Electronically Signed   By: Simonne Come M.D.   On: 07/13/2013 11:41   Dg Chest Portable 1 View  07/18/2013   CLINICAL DATA:  Shortness of breath and decreased O2 saturation. Lung crackles.  EXAM: PORTABLE CHEST - 1 VIEW  COMPARISON:  Chest radiograph performed 07/13/2013  FINDINGS: The lungs are well-aerated. Mild right perihilar opacity could reflect mild pneumonia. There is no evidence of pleural effusion or pneumothorax.  The cardiomediastinal silhouette is within normal limits. No acute osseous abnormalities are seen.  IMPRESSION: Mild right perihilar opacity could reflect mild pneumonia.   Electronically Signed   By: Roanna Raider M.D.   On: 07/18/2013 03:03    Microbiology: Recent Results (from the past 240 hour(s))  CULTURE, BLOOD (ROUTINE X 2)     Status: None   Collection Time    07/18/13  3:20 AM      Result Value Range Status   Specimen Description BLOOD RIGHT FOREARM   Final   Special Requests BOTTLES DRAWN AEROBIC AND ANAEROBIC [facility] EACH   Final   Culture  Setup Time     Final   Value: 07/18/2013 09:22     Performed at [facility]   Culture     Final   Value:        BLOOD CULTURE RECEIVED NO GROWTH TO DATE CULTURE WILL BE HELD FOR 5 DAYS BEFORE ISSUING A FINAL NEGATIVE REPORT     Performed at [facility]   Report Status PENDING   Incomplete  CULTURE, BLOOD (ROUTINE X 2)     Status: None   Collection Time    07/18/13  3:20 AM      Result Value Range Status   Specimen Description BLOOD RIGHT HAND   Final    Special Requests BOTTLES DRAWN AEROBIC AND ANAEROBIC 2.5CC EACH   Final   Culture  Setup Time     Final   Value: 07/18/2013 09:21     Performed at [facility]   Culture     Final   Value:        BLOOD CULTURE RECEIVED NO GROWTH TO DATE CULTURE WILL BE HELD FOR 5 DAYS BEFORE ISSUING A FINAL NEGATIVE REPORT     Performed at [facility]   Report Status PENDING   Incomplete  MRSA PCR SCREENING     Status: None   Collection Time    07/18/13  8:42 AM      Result Value Range Status   MRSA by PCR NEGATIVE  NEGATIVE Final   Comment:  The GeneXpert MRSA Assay (FDA     approved for NASAL specimens     only), is one component of a     comprehensive MRSA colonization     surveillance program. It is not     intended to diagnose MRSA     infection nor to guide or     monitor treatment for     MRSA infections.     Labs: Basic Metabolic Panel:  Recent Labs Lab 07/18/13 0320  NA 136  K 4.4  CL 97  CO2 25  GLUCOSE 146*  BUN 21  CREATININE 0.96  CALCIUM 9.6   Liver Function Tests:  Recent Labs Lab 07/18/13 0320  AST 20  ALT 25  ALKPHOS 71  BILITOT 0.3  PROT 8.9*  ALBUMIN 4.0   No results found for this basename: LIPASE, AMYLASE,  in the last 168 hours No results found for this basename: AMMONIA,  in the last 168 hours CBC:  Recent Labs Lab 07/18/13 0320  WBC 10.6*  NEUTROABS 4.0  HGB 16.1  HCT 44.7  MCV 87.8  PLT 285   Cardiac Enzymes: No results found for this basename: CKTOTAL, CKMB, CKMBINDEX, TROPONINI,  in the last 168 hours BNP: BNP (last 3 results)  Recent Labs  07/18/13 0216  PROBNP 29.7   CBG:  Recent Labs Lab 07/19/13 0807 07/19/13 1157 07/19/13 1719 07/19/13 2113 07/20/13 0750  GLUCAP 158* 203* 177* 197* 118*       Signed:  Jonette Mate N  [facility] 07/20/2013, 9:18 AM    Addendum:  Patient had mild acute hypoxic res failure due to pneumonia; resolved  Ashritha Desrosiers N  07/29/13

## 2013-07-20 NOTE — Progress Notes (Signed)
Pt signed d/c papers, appt with PCP for 07/27/13. Instructed on abx and activity, when to return or notify MD. Pt did not want to wait for PNA/Flu vaccine, information given on where these vaccines may be available.

## 2013-07-24 LAB — CULTURE, BLOOD (ROUTINE X 2): Culture: NO GROWTH

## 2013-11-09 ENCOUNTER — Encounter (HOSPITAL_COMMUNITY): Payer: Self-pay | Admitting: Emergency Medicine

## 2013-11-09 ENCOUNTER — Observation Stay (HOSPITAL_COMMUNITY)
Admission: EM | Admit: 2013-11-09 | Discharge: 2013-11-10 | Disposition: A | Discharge status: Home / Self Care | Payer: BC Managed Care – PPO | Attending: Internal Medicine | Admitting: Internal Medicine

## 2013-11-09 ENCOUNTER — Emergency Department (HOSPITAL_COMMUNITY): Payer: BC Managed Care – PPO

## 2013-11-09 DIAGNOSIS — E119 Type 2 diabetes mellitus without complications: Secondary | ICD-10-CM | POA: Insufficient documentation

## 2013-11-09 DIAGNOSIS — Z7901 Long term (current) use of anticoagulants: Secondary | ICD-10-CM | POA: Insufficient documentation

## 2013-11-09 DIAGNOSIS — I10 Essential (primary) hypertension: Secondary | ICD-10-CM | POA: Insufficient documentation

## 2013-11-09 DIAGNOSIS — Z794 Long term (current) use of insulin: Secondary | ICD-10-CM | POA: Insufficient documentation

## 2013-11-09 DIAGNOSIS — J45901 Unspecified asthma with (acute) exacerbation: Principal | ICD-10-CM | POA: Diagnosis present

## 2013-11-09 DIAGNOSIS — R0602 Shortness of breath: Secondary | ICD-10-CM

## 2013-11-09 DIAGNOSIS — R062 Wheezing: Secondary | ICD-10-CM

## 2013-11-09 LAB — COMPREHENSIVE METABOLIC PANEL
ALBUMIN: 3.9 g/dL (ref 3.5–5.2)
ALK PHOS: 57 U/L (ref 39–117)
ALT: 22 U/L (ref 0–53)
AST: 21 U/L (ref 0–37)
BUN: 21 mg/dL (ref 6–23)
CALCIUM: 9.4 mg/dL (ref 8.4–10.5)
CO2: 21 mEq/L (ref 19–32)
Chloride: 102 mEq/L (ref 96–112)
Creatinine, Ser: 0.83 mg/dL (ref 0.50–1.35)
GFR calc Af Amer: >90 mL/min (ref >90)
GFR calc non Af Amer: >90 mL/min (ref >90)
Glucose, Bld: 155 mg/dL — ABNORMAL HIGH (ref 70–99)
POTASSIUM: 4.6 meq/L (ref 3.7–5.3)
SODIUM: 141 meq/L (ref 137–147)
TOTAL PROTEIN: 8.2 g/dL (ref 6.0–8.3)
Total Bilirubin: 0.3 mg/dL (ref 0.3–1.2)

## 2013-11-09 LAB — CBC WITH DIFFERENTIAL/PLATELET
BASOS ABS: 0 10*3/uL (ref 0.0–0.1)
BASOS PCT: 0 % (ref 0–1)
EOS ABS: 0.6 10*3/uL (ref 0.0–0.7)
Eosinophils Relative: 7 % — ABNORMAL HIGH (ref 0–5)
HCT: 42.1 % (ref 39.0–52.0)
Hemoglobin: 14.9 g/dL (ref 13.0–17.0)
LYMPHS ABS: 3.4 10*3/uL (ref 0.7–4.0)
Lymphocytes Relative: 41 % (ref 12–46)
MCH: 31.4 pg (ref 26.0–34.0)
MCHC: 35.4 g/dL (ref 30.0–36.0)
MCV: 88.6 fL (ref 78.0–100.0)
Monocytes Absolute: 0.5 10*3/uL (ref 0.1–1.0)
Monocytes Relative: 6 % (ref 3–12)
NEUTROS PCT: 46 % (ref 43–77)
Neutro Abs: 3.8 10*3/uL (ref 1.7–7.7)
Platelets: 195 10*3/uL (ref 150–400)
RBC: 4.75 MIL/uL (ref 4.22–5.81)
RDW: 14.1 % (ref 11.5–15.5)
WBC: 8.3 10*3/uL (ref 4.0–10.5)

## 2013-11-09 LAB — TROPONIN I: Troponin I: <0.3 ng/mL (ref <0.30)

## 2013-11-09 LAB — CREATININE, SERUM
CREATININE: 0.84 mg/dL (ref 0.50–1.35)
GFR calc non Af Amer: >90 mL/min (ref >90)

## 2013-11-09 LAB — CBC
HCT: 40.4 % (ref 39.0–52.0)
Hemoglobin: 14.1 g/dL (ref 13.0–17.0)
MCH: 30.9 pg (ref 26.0–34.0)
MCHC: 34.9 g/dL (ref 30.0–36.0)
MCV: 88.6 fL (ref 78.0–100.0)
Platelets: 197 10*3/uL (ref 150–400)
RBC: 4.56 MIL/uL (ref 4.22–5.81)
RDW: 14 % (ref 11.5–15.5)
WBC: 8.5 10*3/uL (ref 4.0–10.5)

## 2013-11-09 LAB — PRO B NATRIURETIC PEPTIDE: Pro B Natriuretic peptide (BNP): 85.6 pg/mL (ref 0–125)

## 2013-11-09 LAB — GLUCOSE, CAPILLARY
GLUCOSE-CAPILLARY: 220 mg/dL — AB (ref 70–99)
GLUCOSE-CAPILLARY: 261 mg/dL — AB (ref 70–99)

## 2013-11-09 MED ORDER — LISINOPRIL 20 MG PO TABS
20.0000 mg | ORAL_TABLET | Freq: Every day | ORAL | Status: DC
  Administered 2013-11-09 – 2013-11-10 (×2): 20 mg via ORAL
  Filled 2013-11-09 (×2): qty 1

## 2013-11-09 MED ORDER — ALBUTEROL (5 MG/ML) CONTINUOUS INHALATION SOLN
10.0000 mg/h | INHALATION_SOLUTION | RESPIRATORY_TRACT | Status: DC
  Administered 2013-11-09: 10 mg/h via RESPIRATORY_TRACT
  Filled 2013-11-09: qty 20

## 2013-11-09 MED ORDER — IPRATROPIUM-ALBUTEROL 0.5-2.5 (3) MG/3ML IN SOLN
3.0000 mL | RESPIRATORY_TRACT | Status: DC
  Administered 2013-11-09 – 2013-11-10 (×4): 3 mL via RESPIRATORY_TRACT
  Filled 2013-11-09 (×5): qty 3

## 2013-11-09 MED ORDER — SODIUM CHLORIDE 0.9 % IJ SOLN
3.0000 mL | Freq: Two times a day (BID) | INTRAMUSCULAR | Status: DC
  Administered 2013-11-09 – 2013-11-10 (×2): 3 mL via INTRAVENOUS

## 2013-11-09 MED ORDER — LORATADINE 10 MG PO TABS
10.0000 mg | ORAL_TABLET | Freq: Every day | ORAL | Status: DC
  Administered 2013-11-09 – 2013-11-10 (×2): 10 mg via ORAL
  Filled 2013-11-09 (×2): qty 1

## 2013-11-09 MED ORDER — HEPARIN SODIUM (PORCINE) 5000 UNIT/ML IJ SOLN
5000.0000 [IU] | Freq: Three times a day (TID) | INTRAMUSCULAR | Status: DC
  Administered 2013-11-09 – 2013-11-10 (×2): 5000 [IU] via SUBCUTANEOUS
  Filled 2013-11-09 (×5): qty 1

## 2013-11-09 MED ORDER — IPRATROPIUM-ALBUTEROL 0.5-2.5 (3) MG/3ML IN SOLN: 3.0000 mL | RESPIRATORY_TRACT | Status: DC | PRN

## 2013-11-09 MED ORDER — LISINOPRIL-HYDROCHLOROTHIAZIDE 20-25 MG PO TABS: 1.0000 | ORAL_TABLET | Freq: Every day | ORAL | Status: DC

## 2013-11-09 MED ORDER — INSULIN ASPART 100 UNIT/ML ~~LOC~~ SOLN
4.0000 [IU] | Freq: Once | SUBCUTANEOUS | Status: AC
  Administered 2013-11-09: 4 [IU] via SUBCUTANEOUS
  Filled 2013-11-09: qty 0.04

## 2013-11-09 MED ORDER — INSULIN ASPART 100 UNIT/ML ~~LOC~~ SOLN
0.0000 [IU] | Freq: Every day | SUBCUTANEOUS | Status: DC
  Administered 2013-11-09: 3 [IU] via SUBCUTANEOUS

## 2013-11-09 MED ORDER — HYDROCHLOROTHIAZIDE 25 MG PO TABS
25.0000 mg | ORAL_TABLET | Freq: Every day | ORAL | Status: DC
  Administered 2013-11-09 – 2013-11-10 (×2): 25 mg via ORAL
  Filled 2013-11-09 (×2): qty 1

## 2013-11-09 MED ORDER — INSULIN ASPART 100 UNIT/ML ~~LOC~~ SOLN
0.0000 [IU] | Freq: Three times a day (TID) | SUBCUTANEOUS | Status: DC
  Administered 2013-11-10: 5 [IU] via SUBCUTANEOUS

## 2013-11-09 MED ORDER — METHYLPREDNISOLONE SODIUM SUCC 125 MG IJ SOLR
125.0000 mg | Freq: Two times a day (BID) | INTRAMUSCULAR | Status: DC
  Administered 2013-11-10: 125 mg via INTRAVENOUS
  Filled 2013-11-09 (×3): qty 2

## 2013-11-09 MED ORDER — SODIUM CHLORIDE 0.9 % IV SOLN: 250.0000 mL | INTRAVENOUS | Status: DC | PRN

## 2013-11-09 MED ORDER — ALBUTEROL SULFATE (2.5 MG/3ML) 0.083% IN NEBU
5.0000 mg | INHALATION_SOLUTION | Freq: Once | RESPIRATORY_TRACT | Status: AC
  Administered 2013-11-09: 5 mg via RESPIRATORY_TRACT
  Filled 2013-11-09: qty 6

## 2013-11-09 MED ORDER — METHYLPREDNISOLONE SODIUM SUCC 125 MG IJ SOLR
125.0000 mg | INTRAMUSCULAR | Status: AC
  Administered 2013-11-09: 125 mg via INTRAVENOUS
  Filled 2013-11-09: qty 2

## 2013-11-09 MED ORDER — GABAPENTIN 300 MG PO CAPS
300.0000 mg | ORAL_CAPSULE | Freq: Three times a day (TID) | ORAL | Status: DC
  Administered 2013-11-09 – 2013-11-10 (×2): 300 mg via ORAL
  Filled 2013-11-09 (×4): qty 1

## 2013-11-09 MED ORDER — SODIUM CHLORIDE 0.9 % IJ SOLN: 3.0000 mL | INTRAMUSCULAR | Status: DC | PRN

## 2013-11-09 NOTE — ED Provider Notes (Signed)
CSN: [id number]     Arrival date & time 11/09/13  1448 History   First MD Initiated Contact with Patient 11/09/13 1449     Chief Complaint  Patient presents with  . Shortness of Breath     (Consider location/radiation/quality/duration/timing/severity/associated sxs/prior Treatment) Patient is a 48 y.o. male presenting with shortness of breath. The history is provided by the patient.  Shortness of Breath Severity:  Moderate Onset quality:  Sudden Duration:  2 hours Timing:  Constant Progression:  Worsening Chronicity:  New Context comment:  While getting dressed Relieved by:  Nothing Worsened by:  Nothing tried Ineffective treatments:  Inhaler Associated symptoms: cough   Associated symptoms: no abdominal pain, no chest pain, no fever, no headaches, no neck pain and no vomiting   Cough:    Cough characteristics:  Non-productive   Severity:  Mild   Onset quality:  Gradual   Timing:  Intermittent   Progression:  Unchanged   Past Medical History  Diagnosis Date  . Hypertension   . Diabetes mellitus   . Gout   . Bronchitis    No past surgical history on file. No family history on file. History  Substance Use Topics  . Smoking status: Never Smoker   . Smokeless tobacco: Not on file  . Alcohol Use: Yes    Review of Systems  Constitutional: Negative for fever.  HENT: Negative for drooling and rhinorrhea.   Eyes: Negative for pain.  Respiratory: Positive for cough and shortness of breath.   Cardiovascular: Negative for chest pain and leg swelling.  Gastrointestinal: Negative for nausea, vomiting, abdominal pain and diarrhea.  Genitourinary: Negative for dysuria and hematuria.  Musculoskeletal: Negative for gait problem and neck pain.  Skin: Negative for color change.  Neurological: Negative for numbness and headaches.  Hematological: Negative for adenopathy.  Psychiatric/Behavioral: Negative for behavioral problems.  All other systems reviewed and are  negative.     Allergies  Review of patient's allergies indicates no known allergies.  Home Medications   Prior to Admission medications   Medication Sig Start Date End Date Taking? Authorizing Provider  albuterol (PROVENTIL HFA;VENTOLIN HFA) 108 (90 BASE) MCG/ACT inhaler Inhale 2 puffs into the lungs every 6 (six) hours as needed for wheezing or shortness of breath. 07/20/13   Kinnie Feil, MD  glimepiride (AMARYL) 4 MG tablet Take 4 mg by mouth daily with breakfast.    Historical Provider, MD  levofloxacin (LEVAQUIN) 750 MG tablet Take 1 tablet (750 mg total) by mouth daily. 07/20/13   Kinnie Feil, MD  lisinopril-hydrochlorothiazide (PRINZIDE,ZESTORETIC) 20-25 MG per tablet Take 1 tablet by mouth daily. 04/19/12   Harden Mo, MD  metFORMIN (GLUCOPHAGE) 1000 MG tablet Take 1,000 mg by mouth 2 (two) times daily with a meal.    Historical Provider, MD  pseudoephedrine-guaifenesin (MUCINEX D) 60-600 MG per tablet Take 2 tablets by mouth every 12 (twelve) hours.    Historical Provider, MD   There were no vitals taken for this visit. Physical Exam  Nursing note and vitals reviewed. Constitutional: He is oriented to person, place, and time. He appears well-developed and well-nourished.  obese  HENT:  Head: Normocephalic and atraumatic.  Right Ear: External ear normal.  Left Ear: External ear normal.  Nose: Nose normal.  Mouth/Throat: Oropharynx is clear and moist. No oropharyngeal exudate.  Eyes: Conjunctivae and EOM are normal. Pupils are equal, round, and reactive to light.  Neck: Normal range of motion. Neck supple.  Cardiovascular: Normal rate, regular rhythm,  normal heart sounds and intact distal pulses.  Exam reveals no gallop and no friction rub.   No murmur heard. Pulmonary/Chest: He is in respiratory distress. He has wheezes (diffuse mild to moderate wheezing.).  Mild tachypnea.  Abdominal: Soft. Bowel sounds are normal. He exhibits no distension. There is no  tenderness. There is no rebound and no guarding.  Musculoskeletal: Normal range of motion. He exhibits no edema and no tenderness.  Neurological: He is alert and oriented to person, place, and time.  Skin: Skin is warm and dry.  Psychiatric: He has a normal mood and affect. His behavior is normal.    ED Course  Procedures (including critical care time) Labs Review Labs Reviewed  CBC WITH DIFFERENTIAL - Abnormal; Notable for the following:    Eosinophils Relative 7 (*)    All other components within normal limits  COMPREHENSIVE METABOLIC PANEL - Abnormal; Notable for the following:    Glucose, Bld 155 (*)    All other components within normal limits  COMPREHENSIVE METABOLIC PANEL - Abnormal; Notable for the following:    CO2 18 (*)    Glucose, Bld 300 (*)    BUN 27 (*)    All other components within normal limits  CBC WITH DIFFERENTIAL - Abnormal; Notable for the following:    HCT 38.1 (*)    Neutrophils Relative % 88 (*)    Lymphocytes Relative 11 (*)    Monocytes Relative 1 (*)    All other components within normal limits  HEMOGLOBIN A1C - Abnormal; Notable for the following:    Hemoglobin A1C 7.0 (*)    Mean Plasma Glucose 154 (*)    All other components within normal limits  GLUCOSE, CAPILLARY - Abnormal; Notable for the following:    Glucose-Capillary 220 (*)    All other components within normal limits  GLUCOSE, CAPILLARY - Abnormal; Notable for the following:    Glucose-Capillary 261 (*)    All other components within normal limits  GLUCOSE, CAPILLARY - Abnormal; Notable for the following:    Glucose-Capillary 255 (*)    All other components within normal limits  TROPONIN I  PRO B NATRIURETIC PEPTIDE  CBC  CREATININE, SERUM    Imaging Review Dg Chest 2 View  11/10/2013   CLINICAL DATA:  Shortness of breath, dyspnea  EXAM: CHEST  2 VIEW  COMPARISON:  November 09, 2013  FINDINGS: The heart size and mediastinal contours are within normal limits. There is no focal  infiltrate, pulmonary edema, or pleural effusion. The visualized skeletal structures are stable.  IMPRESSION: No active cardiopulmonary disease.   Electronically Signed   By: Abelardo Diesel M.D.   On: 11/10/2013 08:20   Dg Chest 2 View  11/09/2013   CLINICAL DATA:  Shortness of breath.  EXAM: CHEST - 2 VIEW  COMPARISON:  DG CHEST 1V PORT dated 07/18/2013; DG CHEST 2 VIEW dated 07/13/2013  FINDINGS: The heart size and mediastinal contours are within normal limits. There is no evidence of pulmonary edema, consolidation, pneumothorax, nodule or pleural fluid. There is mild chronic eventration of the anterior right hemidiaphragm. Stable mild degenerative changes of the thoracic spine.  IMPRESSION: No active disease.   Electronically Signed   By: Aletta Edouard M.D.   On: 11/09/2013 17:53     EKG Interpretation None      Date: 11/09/2013  Rate: 109  Rhythm: sinus tachycardia  QRS Axis: normal  Intervals: normal  ST/T Wave abnormalities: normal  Conduction Disutrbances:none  Narrative Interpretation: No  ST or T wave changes consistent with ischemia.    Old EKG Reviewed: unchanged   MDM   Final diagnoses:  SOB (shortness of breath)  Wheezing    3:11 PM 48 y.o. male who presents with shortness of breath that began one to 2 hours ago. The patient denies a history of asthma but has had wheezing in the past and uses an albuterol inhaler as needed. He denies any fevers but does have a chronic cough which is unchanged. He is afebrile and mildly tachycardic here. He got albuterol in route. Will get screening labwork, chest x-ray, EKG, solumedrol and continued albuterol. The patient denies any chest pain.  S/p solumedrol, cont alb tx for 1 hr, 1 alb neb en route, and 1 alb neb here w/ continued wheezing. Pt is improved, but I think he would benefit from scheduled tx's. Will admit to hospitalist.     Blanchard Kelch, MD 11/10/13 1027

## 2013-11-09 NOTE — ED Notes (Signed)
Attempted report 

## 2013-11-09 NOTE — Progress Notes (Signed)
Pt arrived to unit via stretcher. Pt was oriented to room and call bell system. Pt is in no apparent distress at this time. Safety video has been watched. MD has been paged to see if pt needs to be placed on tele & pt's CBG has been checked.

## 2013-11-09 NOTE — Progress Notes (Signed)
Paged MD Ernestina Patches about pt's elevated CBG  Of 220. Pt to be placed on cardiac monitoring and to get 4 units of novolog now.

## 2013-11-09 NOTE — H&P (Signed)
Hospitalist Admission History and Physical  Patient name: Troy [name] Medical record number: [id number] Date of birth: 1965/12/26 Age: 48 y.o. Gender: male  Primary Care Provider: [name], MD  Chief Complaint: dyspnea, asthma exacerbation  History of Present Illness:[name] with prior hx/o bronchitis/asthma, CAP (admitted 06/2013), type 2 DM presenting with asthma exacerbation. Patient states he had progressive onset of increased work of breathing and wheezing since earlier today. Symptoms did not improve despite using when necessary albuterol from last admission. EMS was called. Patient ought to [facility] he offer further evaluation. Has had some mild allergic sxs. Recently started on zyrtec by PCP.  Upon presentation labs within normal limits. Chest x-ray negative for pneumonia. Patient was given a continuous albuterol treatments as well as one additional treatment and IV Solu-Medrol. Increase work of breathing symptomatically improved however patient did have persistent wheezing. Has remained non-hypoxic throughout the hospital course. Initial plan was for discharge home with PCP followup, however patient feels more comfortable being admitted and watched overnight. Patient denies any chest pain, nausea, diaphoresis, abdominal pain, diarrhea, dysuria, hemiparesis or confusion.  Patient Active Problem List   Diagnosis Date Noted  . Asthma exacerbation 11/09/2013  . CAP (community acquired pneumonia) 07/18/2013  . Diabetes 07/18/2013  . Hypertension    Past Medical History: Past Medical History  Diagnosis Date  . Hypertension   . Diabetes mellitus   . Gout   . Bronchitis     Past Surgical History: History reviewed. No pertinent past surgical history.  Social History: History   Social History  . Marital Status: Married    Spouse Name: N/A    Number of Children: N/A  . Years of Education: N/A   Social History Main Topics  . Smoking status:  Never Smoker   . Smokeless tobacco: None  . Alcohol Use: Yes     Comment: occasionally  . Drug Use: No  . Sexual Activity: None   Other Topics Concern  . None   Social History Narrative  . None    Family History: No family history on file.  Allergies: No Known Allergies  Current Facility-Administered Medications  Medication Dose Route Frequency Provider Last Rate Last Dose  . 0.9 %  sodium chloride infusion  250 mL Intravenous PRN Shanda Howells, MD      . albuterol (PROVENTIL,VENTOLIN) solution continuous neb  10 mg/hr Nebulization Continuous Blanchard Kelch, MD 2 mL/hr at 11/09/13 1515 10 mg/hr at 11/09/13 1515  . gabapentin (NEURONTIN) capsule 300 mg  300 mg Oral TID Shanda Howells, MD      . heparin injection 5,000 Units  5,000 Units Subcutaneous 3 times per day Shanda Howells, MD      . insulin aspart (novoLOG) injection 0-5 Units  0-5 Units Subcutaneous QHS Shanda Howells, MD      . Derrill Memo ON 11/10/2013] insulin aspart (novoLOG) injection 0-9 Units  0-9 Units Subcutaneous TID WC Shanda Howells, MD      . ipratropium-albuterol (DUONEB) 0.5-2.5 (3) MG/3ML nebulizer solution 3 mL  3 mL Nebulization Q4H Shanda Howells, MD      . ipratropium-albuterol (DUONEB) 0.5-2.5 (3) MG/3ML nebulizer solution 3 mL  3 mL Nebulization Q2H PRN Shanda Howells, MD      . lisinopril-hydrochlorothiazide (PRINZIDE,ZESTORETIC) 20-25 MG per tablet 1 tablet  1 tablet Oral Daily Shanda Howells, MD      . loratadine (CLARITIN) tablet 10 mg  10 mg Oral Daily Shanda Howells, MD      . [  START ON 11/10/2013] methylPREDNISolone sodium succinate (SOLU-MEDROL) 125 mg/2 mL injection 125 mg  125 mg Intravenous Q12H Shanda Howells, MD      . sodium chloride 0.9 % injection 3 mL  3 mL Intravenous Q12H Shanda Howells, MD      . sodium chloride 0.9 % injection 3 mL  3 mL Intravenous PRN Shanda Howells, MD       Current Outpatient Prescriptions  Medication Sig Dispense Refill  . albuterol (PROVENTIL HFA;VENTOLIN HFA) 108 (90  BASE) MCG/ACT inhaler Inhale 2 puffs into the lungs every 6 (six) hours as needed for wheezing or shortness of breath.  1 Inhaler  2  . cetirizine (ZYRTEC) 10 MG tablet Take 10 mg by mouth daily.      Marland Kitchen gabapentin (NEURONTIN) 300 MG capsule Take 300 mg by mouth 3 (three) times daily.      Marland Kitchen glimepiride (AMARYL) 4 MG tablet Take 4 mg by mouth daily with breakfast.      . lisinopril-hydrochlorothiazide (PRINZIDE,ZESTORETIC) 20-25 MG per tablet Take 1 tablet by mouth daily.  30 tablet  0  . metFORMIN (GLUCOPHAGE) 1000 MG tablet Take 1,000 mg by mouth 2 (two) times daily with a meal.       Review Of Systems: 12 point ROS negative except as noted above in HPI.  Physical Exam: Filed Vitals:   11/09/13 1814  BP:   Pulse: 110  Temp:   Resp: 18    General: alert, cooperative and moderately obese HEENT: PERRLA and extra ocular movement intact Heart: S1, S2 normal, no murmur, rub or gallop, regular rate and rhythm Lungs: diffuse inspiratory, expiratory wheezes  Abdomen: abdomen is soft without significant tenderness, masses, organomegaly or guarding Extremities: extremities normal, atraumatic, no cyanosis or edema Skin:no rashes, no ecchymoses Neurology: normal without focal findings, mental status, speech normal, alert and oriented x3, PERLA and reflexes normal and symmetric  Labs and Imaging: Lab Results  Component Value Date/Time   NA 141 11/09/2013  3:30 PM   K 4.6 11/09/2013  3:30 PM   CL 102 11/09/2013  3:30 PM   CO2 21 11/09/2013  3:30 PM   BUN 21 11/09/2013  3:30 PM   CREATININE 0.83 11/09/2013  3:30 PM   GLUCOSE 155* 11/09/2013  3:30 PM   Lab Results  Component Value Date   WBC 8.3 11/09/2013   HGB 14.9 11/09/2013   HCT 42.1 11/09/2013   MCV 88.6 11/09/2013   PLT 195 11/09/2013   Dg Chest 2 View  11/09/2013   CLINICAL DATA:  Shortness of breath.  EXAM: CHEST - 2 VIEW  COMPARISON:  DG CHEST 1V PORT dated 07/18/2013; DG CHEST 2 VIEW dated 07/13/2013  FINDINGS: The heart size and  mediastinal contours are within normal limits. There is no evidence of pulmonary edema, consolidation, pneumothorax, nodule or pleural fluid. There is mild chronic eventration of the anterior right hemidiaphragm. Stable mild degenerative changes of the thoracic spine.  IMPRESSION: No active disease.   Electronically Signed   By: Aletta Edouard M.D.   On: 11/09/2013 17:53     Assessment and Plan: Troy [name] is a 48 y.o. year old male presenting with asthma exacerbation  Dyspnea: Likely secondary to asthma exacerbation. Will place on scheduled duonebs. IV solumedrol. Prn supplemental O2. Repeat CXR in am to r/o PNA given prior history. No resp distress on exam. Pt reports that he has never had a history of asthma prior to her his admission in December for bronchitis and pneumonia. ? Atopic  disease as nidus given allergic sxs. May need pulm f/u outpt.   Type 2 DM: SSI. A1C. Anticipate uptrending CBGs given solumedrol use.   HTN: hemodynamically stable. Continue home meds.  FEN/GI: heart healthy, carb modified diet.  Prophylaxis: sub q heparin  Disposition: pending further evaluation  Code Status:Full Code        Shanda Howells MD  Pager: [phone]

## 2013-11-09 NOTE — ED Notes (Signed)
Pt arrived from home by [facility] with c/o SOB. EMS noted expiratory wheezing throughout lung fields and administered 10 total Albuterol neb and 0.5 Atrovent. Pt also c/o nonproductive cough. Stated that SOB came on all of a sudden.

## 2013-11-10 ENCOUNTER — Observation Stay (HOSPITAL_COMMUNITY): Payer: BC Managed Care – PPO

## 2013-11-10 DIAGNOSIS — R0602 Shortness of breath: Secondary | ICD-10-CM

## 2013-11-10 LAB — COMPREHENSIVE METABOLIC PANEL
ALK PHOS: 53 U/L (ref 39–117)
ALT: 21 U/L (ref 0–53)
AST: 18 U/L (ref 0–37)
Albumin: 3.5 g/dL (ref 3.5–5.2)
BUN: 27 mg/dL — ABNORMAL HIGH (ref 6–23)
CALCIUM: 9.2 mg/dL (ref 8.4–10.5)
CO2: 18 mEq/L — ABNORMAL LOW (ref 19–32)
Chloride: 98 mEq/L (ref 96–112)
Creatinine, Ser: 0.84 mg/dL (ref 0.50–1.35)
GFR calc Af Amer: >90 mL/min (ref >90)
GFR calc non Af Amer: >90 mL/min (ref >90)
Glucose, Bld: 300 mg/dL — ABNORMAL HIGH (ref 70–99)
POTASSIUM: 5.3 meq/L (ref 3.7–5.3)
SODIUM: 138 meq/L (ref 137–147)
TOTAL PROTEIN: 7.7 g/dL (ref 6.0–8.3)
Total Bilirubin: 0.3 mg/dL (ref 0.3–1.2)

## 2013-11-10 LAB — CBC WITH DIFFERENTIAL/PLATELET
BASOS ABS: 0 10*3/uL (ref 0.0–0.1)
BASOS PCT: 0 % (ref 0–1)
EOS PCT: 0 % (ref 0–5)
Eosinophils Absolute: 0 10*3/uL (ref 0.0–0.7)
HEMATOCRIT: 38.1 % — AB (ref 39.0–52.0)
HEMOGLOBIN: 13.3 g/dL (ref 13.0–17.0)
Lymphocytes Relative: 11 % — ABNORMAL LOW (ref 12–46)
Lymphs Abs: 0.9 10*3/uL (ref 0.7–4.0)
MCH: 30.6 pg (ref 26.0–34.0)
MCHC: 34.9 g/dL (ref 30.0–36.0)
MCV: 87.8 fL (ref 78.0–100.0)
MONOS PCT: 1 % — AB (ref 3–12)
Monocytes Absolute: 0.1 10*3/uL (ref 0.1–1.0)
Neutro Abs: 7.4 10*3/uL (ref 1.7–7.7)
Neutrophils Relative %: 88 % — ABNORMAL HIGH (ref 43–77)
Platelets: 222 10*3/uL (ref 150–400)
RBC: 4.34 MIL/uL (ref 4.22–5.81)
RDW: 14 % (ref 11.5–15.5)
WBC: 8.4 10*3/uL (ref 4.0–10.5)

## 2013-11-10 LAB — HEMOGLOBIN A1C
Hgb A1c MFr Bld: 7 % — ABNORMAL HIGH (ref <5.7)
Mean Plasma Glucose: 154 mg/dL — ABNORMAL HIGH (ref <117)

## 2013-11-10 LAB — GLUCOSE, CAPILLARY
Glucose-Capillary: 255 mg/dL — ABNORMAL HIGH (ref 70–99)
Glucose-Capillary: 284 mg/dL — ABNORMAL HIGH (ref 70–99)

## 2013-11-10 MED ORDER — PREDNISONE (PAK) 10 MG PO TABS: ORAL_TABLET | ORAL | Status: DC

## 2013-11-10 MED ORDER — IPRATROPIUM-ALBUTEROL 0.5-2.5 (3) MG/3ML IN SOLN: 3.0000 mL | Freq: Four times a day (QID) | RESPIRATORY_TRACT | Status: DC | PRN

## 2013-11-10 NOTE — Plan of Care (Signed)
Problem: Food- and Nutrition-Related Knowledge Deficit (NB-1.1) Goal: Nutrition education Formal process to instruct or train a patient/client in a skill or to impart knowledge to help patients/clients voluntarily manage or modify food choices and eating behavior to maintain or improve health. Outcome: Completed/Met Date Met:  11/10/13 RD consulted to give Diabetes Diet Education   48 y.o. year old male with prior hx/o bronchitis/asthma, CAP (admitted 06/2013), type 2 DM presenting with asthma exacerbation. Pt has recently started to control his blood sugar and trying to eat better carbohydrate foods. Pt's A1C has dropped from 11.0 in December 2014, to 7.0 in April 2015. Pt's dietary recall included things like eggs, sausage, sandwiches, greens, chicken. Pt stated that he has been trying to eat more baked foods lately.     Lab Results  Component Value Date    HGBA1C 7.0* 11/09/2013   -Gave "Carbohydrate Counting" handout from the Academy of [employer]. Went over sources of carbohydrates, meal spacing, myPlate. Focused on eating snacks throughout the day and pairing carbohydrates with protein.  Pt was very eager and had a lot of questions. Pt was appreciative of the handout.   Used teachback. Expect good compliance.  Pt on carb modified.  Medications and Labs reviewed.   Carrolyn Leigh, BS Nutrition Intern  Pager: [phone]    I agree with the above information and made appropriate revisions. Inda Coke MS, RD, LDN Inpatient Registered Dietitian Pager: [phone] After-hours pager: [phone]

## 2013-11-10 NOTE — Care Management Note (Signed)
    Page 1 of 1   11/10/2013     1:02:23 PM   CARE MANAGEMENT NOTE 11/10/2013  Patient:  [facility]   Account Number:  [id number]  Date Initiated:  11/10/2013  Documentation initiated by:  Tomi Bamberger  Subjective/Objective Assessment:   dx resp dis  admit- from home.     Action/Plan:   Anticipated DC Date:  11/10/2013   Anticipated DC Plan:  HOME/SELF CARE      DC Planning Services  CM consult      PAC Choice  DURABLE MEDICAL EQUIPMENT   Choice offered to / List presented to:  C-1 Patient   DME arranged  NEBULIZER MACHINE      DME agency  [facility].        Status of service:  Completed, signed off Medicare Important Message given?   (If response is "NO", the following Medicare IM given date fields will be blank) Date Medicare IM given:   Date Additional Medicare IM given:    Discharge Disposition:  HOME/SELF CARE  Per UR Regulation:  Reviewed for med. necessity/level of care/duration of stay  If discussed at [name] of Stay Meetings, dates discussed:    Comments:  11/10/13 [place], BSN [phone] patient from home, for dc today, will need neb machine, this has been ordered, will need scripts for neb medications from MD

## 2013-11-10 NOTE — Progress Notes (Addendum)
Inpatient Diabetes Program Recommendations  AACE/ADA: New Consensus Statement on Inpatient Glycemic Control (2013)  Target Ranges:  Prepandial:   less than 140 mg/dL      Peak postprandial:   less than 180 mg/dL (1-2 hours)      Critically ill patients:  140 - 180 mg/dL     Results for [name], [name] (MRN [id number]) as of 11/10/2013 09:54  Ref. Range 11/09/2013 20:21 11/09/2013 22:21 11/10/2013 07:32  Glucose-Capillary Latest Range: 70-99 mg/dL 220 (H) 261 (H) 255 (H)    Results for [name], [name] (MRN [id number]) as of 11/10/2013 09:54  Ref. Range 07/19/2013 10:28 11/09/2013 19:31  Hemoglobin A1C Latest Range: <5.7 % 11.0 (H) 7.0 (H)     Diabetes history: Type 2 DM  Home DM Meds: Amaryl 4 mg daily + Metformin 1000 mg bid    **Admitted with asthma flare.  Patient started on Novolog Sensitive SSI last night.  Patient receiving IV Solumedrol 125 mg Q12 hours.  Note that patient's A1c shows a huge improvement since December.   MD- If patient continues to have elevated glucose levels while on IV steroids, please consider adding basal insulin while in hospital- Lantus 15 units QHS (~0.15 units/kg dosing)  May also want to increase SSI to Moderate scale tid ac + HS   Spoke with patient this morning about his improved A1c level.  Praised pt for this improvement.  Patient had questions about carbohydrate modified diet.  Reviewed basic DM diet information with patient.  Stressed importance of portion control and avoidance of sugar containing beverages.  Patient asked to see dietitian while here in hospital.  RD consult placed.   Will follow Troy Quaker RN, MSN, CDE Diabetes Coordinator Inpatient Diabetes Program Team Pager: [phone] (8a-10p)'

## 2013-11-10 NOTE — Discharge Summary (Signed)
Physician Discharge Summary  Troy [name] BJS:[id number] DOB: [birth year] DOA: 11/09/2013  PCP: [name], MD  Admit date: 11/09/2013 Discharge date: 11/10/2013  Time spent: 40 minutes  Recommendations for Outpatient Follow-up:  1. Follow up with PCP next Monday.  Discharge Diagnoses:  Active Problems:   Asthma exacerbation   Discharge Condition: Stable  Diet recommendation: Carb modified  Filed Weights   11/09/13 1456 11/09/13 2006  Weight: 122.471 kg (270 lb) 122.1 kg (269 lb 2.9 oz)    History of present illness:  [name] with prior hx/o bronchitis/asthma, CAP (admitted 06/2013), type 2 DM presenting with asthma exacerbation. Patient states he had progressive onset of increased work of breathing and wheezing since earlier today. Symptoms did not improve despite using when necessary albuterol from last admission. EMS was called. Patient ought to [facility] he offer further evaluation. Has had some mild allergic sxs. Recently started on zyrtec by PCP.  Upon presentation labs within normal limits. Chest x-ray negative for pneumonia. Patient was given a continuous albuterol treatments as well as one additional treatment and IV Solu-Medrol. Increase work of breathing symptomatically improved however patient did have persistent wheezing. Has remained non-hypoxic throughout the hospital course. Initial plan was for discharge home with PCP followup, however patient feels more comfortable being admitted and watched overnight. Patient denies any chest pain, nausea, diaphoresis, abdominal pain, diarrhea, dysuria, hemiparesis or confusion.  Hospital Course:   Asthma exacerbation Patient admitted with shortness of breath and wheezing secondary to asthma exacerbation, place and schedule nebulizers, started on IV Solu-Medrol and as needed supplemental oxygen. Chest x-ray showed no evidence of acute infection. Patient also denies any significant productive cough or  fever/chills. Patient complaining about that is likely secondary to seasonal allergies. Patient discharged home on prednisone taper and DuoNeb nebulization treatment. Patient to followup with primary care physician, if this continues she might need to be placed on long acting inhalers.  Diabetes mellitus type 2 Patient is on metformin and Amaryl, blood sugar was in the high side in the hospital secondary to steroids administration. Discussed with the patient, that as long as he is on steroids blood sugar will be in the high side, especially postprandial.  Hypertension Medications continued throughout the hospital stay.  Procedures:  None  Consultations:  None  Discharge Exam: Filed Vitals:   11/10/13 1029  BP: 170/99  Pulse:   Temp:   Resp:    General: Alert and awake, oriented x3, not in any acute distress. HEENT: anicteric sclera, pupils reactive to light and accommodation, EOMI CVS: S1-S2 clear, no murmur rubs or gallops Chest: clear to auscultation bilaterally, no wheezing, rales or rhonchi Abdomen: soft nontender, nondistended, normal bowel sounds, no organomegaly Extremities: no cyanosis, clubbing or edema noted bilaterally Neuro: Cranial nerves II-XII intact, no focal neurological deficits  Discharge Instructions You were cared for by a hospitalist during your hospital stay. If you have any questions about your discharge medications or the care you received while you were in the hospital after you are discharged, you can call the unit and asked to speak with the hospitalist on call if the hospitalist that took care of you is not available. Once you are discharged, your primary care physician will handle any further medical issues. Please note that NO REFILLS for any discharge medications will be authorized once you are discharged, as it is imperative that you return to your primary care physician (or establish a relationship with a primary care physician if  you do not have  one) for your aftercare needs so that they can reassess your need for medications and monitor your lab values.  Discharge Orders   Future Orders Complete By Expires   Diet Carb Modified  As directed    DME Nebulizer machine  As directed    Increase activity slowly  As directed        Medication List         albuterol 108 (90 BASE) MCG/ACT inhaler  Commonly known as:  PROVENTIL HFA;VENTOLIN HFA  Inhale 2 puffs into the lungs every 6 (six) hours as needed for wheezing or shortness of breath.     cetirizine 10 MG tablet  Commonly known as:  ZYRTEC  Take 10 mg by mouth daily.     gabapentin 300 MG capsule  Commonly known as:  NEURONTIN  Take 300 mg by mouth 3 (three) times daily.     glimepiride 4 MG tablet  Commonly known as:  AMARYL  Take 4 mg by mouth daily with breakfast.     ipratropium-albuterol 0.5-2.5 (3) MG/3ML Soln  Commonly known as:  DUONEB  Take 3 mLs by nebulization every 6 (six) hours as needed (Wheezing).     lisinopril-hydrochlorothiazide 20-25 MG per tablet  Commonly known as:  PRINZIDE,ZESTORETIC  Take 1 tablet by mouth daily.     metFORMIN 1000 MG tablet  Commonly known as:  GLUCOPHAGE  Take 1,000 mg by mouth 2 (two) times daily with a meal.     predniSONE 10 MG tablet  Commonly known as:  STERAPRED UNI-PAK  Take 6-5-4-3-2-1 PO daily till gone       No Known Allergies    The results of significant diagnostics from this hospitalization (including imaging, microbiology, ancillary and laboratory) are listed below for reference.    Significant Diagnostic Studies: Dg Chest 2 View  11/10/2013   CLINICAL DATA:  Shortness of breath, dyspnea  EXAM: CHEST  2 VIEW  COMPARISON:  November 09, 2013  FINDINGS: The heart size and mediastinal contours are within normal limits. There is no focal infiltrate, pulmonary edema, or pleural effusion. The visualized skeletal structures are stable.  IMPRESSION: No active cardiopulmonary disease.   Electronically Signed   By:  Abelardo Diesel M.D.   On: 11/10/2013 08:20   Dg Chest 2 View  11/09/2013   CLINICAL DATA:  Shortness of breath.  EXAM: CHEST - 2 VIEW  COMPARISON:  DG CHEST 1V PORT dated 07/18/2013; DG CHEST 2 VIEW dated 07/13/2013  FINDINGS: The heart size and mediastinal contours are within normal limits. There is no evidence of pulmonary edema, consolidation, pneumothorax, nodule or pleural fluid. There is mild chronic eventration of the anterior right hemidiaphragm. Stable mild degenerative changes of the thoracic spine.  IMPRESSION: No active disease.   Electronically Signed   By: Aletta Edouard M.D.   On: 11/09/2013 17:53    Microbiology: No results found for this or any previous visit (from the past 240 hour(s)).   Labs: Basic Metabolic Panel:  Recent Labs Lab 11/09/13 1530 11/09/13 1931 11/10/13 0353  NA 141  --  138  K 4.6  --  5.3  CL 102  --  98  CO2 21  --  18*  GLUCOSE 155*  --  300*  BUN 21  --  27*  CREATININE 0.83 0.84 0.84  CALCIUM 9.4  --  9.2   Liver Function Tests:  Recent Labs Lab 11/09/13 1530 11/10/13 0353  AST 21 18  ALT  22 21  ALKPHOS 57 53  BILITOT 0.3 0.3  PROT 8.2 7.7  ALBUMIN 3.9 3.5   No results found for this basename: LIPASE, AMYLASE,  in the last 168 hours No results found for this basename: AMMONIA,  in the last 168 hours CBC:  Recent Labs Lab 11/09/13 1530 11/09/13 1931 11/10/13 0353  WBC 8.3 8.5 8.4  NEUTROABS 3.8  --  7.4  HGB 14.9 14.1 13.3  HCT 42.1 40.4 38.1*  MCV 88.6 88.6 87.8  PLT 195 197 222   Cardiac Enzymes:  Recent Labs Lab 11/09/13 1530  TROPONINI <0.30   BNP: BNP (last 3 results)  Recent Labs  07/18/13 0216 11/09/13 1530  PROBNP 29.7 85.6   CBG:  Recent Labs Lab 11/09/13 2021 11/09/13 2221 11/10/13 0732 11/10/13 1127  GLUCAP 220* 261* 255* 284*       Signed:  Ellyssa Zagal  [facility] 11/10/2013, 1:44 PM

## 2013-11-15 NOTE — Clinical Social Work Psych Assess (Signed)
Clinical Social Work Department CLINICAL SOCIAL WORK PSYCHIATRY SERVICE LINE ASSESSMENT 11/11/2013  Patient:  Troy [name]  Account:  [id number]  Admit Date:  11/09/2013  Clinical Social Worker:  Wylene Men  Date/Time:  11/11/2013 10:00 AM Referred by:  RN  Date referred:  11/11/2013 Reason for Referral  Psychosocial assessment  Other - See comment   Presenting Symptoms/Problems (In the person's/family's own words):   Psych CSW was consulted for PHQ9 score of 9   Abuse/Neglect/Trauma History (check all that apply)  Denies history   Abuse/Neglect/Trauma Comments:   none reported or noted in the chart   Psychiatric History (check all that apply)  Denies history   Psychiatric medications:  none reported or noted in the chart   Current Mental Health Hospitalizations/Previous Mental Health History:   denies   Current provider:   PCP on file  no MH f/u   Place and Date:   ongoing   Current Medications:   0.9 %  sodium chloride infusion   250 mL  Intravenous  PRN Shanda Howells, MD  .  albuterol (PROVENTIL,VENTOLIN) solution continuous neb 10 mg/hr  Nebulization  Continuous  Blanchard Kelch, MD 2 mL/hr at 11/09/13 1515  10 mg/hr at 11/09/13 1515  .  gabapentin (NEURONTIN) capsule 300 mg   300 mg  Oral TID  Shanda Howells, MD  .  heparin injection 5,000 Units   5,000 Units Subcutaneous  3 times per day  Shanda Howells, MD  .  insulin aspart (novoLOG) injection 0-5 Units   0-5 Units Subcutaneous  QHS  Shanda Howells, MD  .  Derrill Memo ON 11/10/2013] insulin aspart (novoLOG) injection 0-9 Units   0-9 Units  Subcutaneous  TID WC  Shanda Howells, MD  .  ipratropium-albuterol (DUONEB) 0.5-2.5 (3) MG/3ML nebulizer solution 3 mL   3 mL  Nebulization  Q4H  Shanda Howells, MD  .  ipratropium-albuterol (DUONEB) 0.5-2.5 (3) MG/3ML nebulizer solution 3 mL   3 mL  Nebulization  Q2H PRN Shanda Howells, MD  .  lisinopril-hydrochlorothiazide (PRINZIDE,ZESTORETIC) 20-25 MG per tablet 1 tablet    1 tablet  Oral  Daily Shanda Howells, MD  .  loratadine (CLARITIN) tablet 10 mg   10 mg  Oral  Daily Shanda Howells, MD  .  Derrill Memo ON 11/10/2013] methylPREDNISolone sodium succinate (SOLU-MEDROL) 125 mg/2 mL injection 125 mg   125 mg Intravenous  Q12H  Shanda Howells, MD  .  sodium chloride 0.9 % injection 3 mL   3 mL  Intravenous Q12H  Shanda Howells, MD  .  sodium chloride 0.9 % injection 3 mL   3 mL  Intravenous PRN  Shanda Howells, MD   Previous Impatient Admission/Date/Reason:   07/18/13 presented to [facility] ED with SOB.   Emotional Health / Current Symptoms    Suicide/Self Harm  None reported   Suicide attempt in the past:   denies   Other harmful behavior:   none/denies   Psychotic/Dissociative Symptoms  None reported   Other Psychotic/Dissociative Symptoms:   none    Attention/Behavioral Symptoms  Within Normal Limits   Other Attention / Behavioral Symptoms:   none    Cognitive Impairment  Orientation - Place  Orientation - Self  Orientation - Situation  Orientation - Time   Other Cognitive Impairment:   none    Mood and Adjustment  Mood Congruent    Stress, Anxiety, Trauma, Any Recent Loss/Stressor  Other - See comment   Anxiety (frequency):   pt exhibited minimal anxiety/depression  surrounding pt's medical conditions namely: diabetes and asthma   Phobia (specify):   none   Compulsive behavior (specify):   none   Obsessive behavior (specify):   none   Other:   none other than those listed above    SBIRT completed (please refer for detailed history):  N  Self-reported substance use:   denies use   Urinary Drug Screen Completed:  N Alcohol level:   denies use    Environmental/Housing/Living Arrangement  Stable housing   Who is in the home:   wife, Inez Catalina   Emergency contact:  Wife, Inez Catalina [employer]   Patient's Strengths and Goals (patient's own words):   Pt has  stable income, stable housing, support system in place and insurance.  pt is also embracing education/nutrition education surrounding his medical conditions: diabetes   Clinical Social Worker's Interpretive Summary:   Psych CSW assessed pt at bedside.  pt was alert and oriented x4.  Pt has been assessed by the Diabetes coordinator and PHQ9 score was 9.    Pt states that he has been going through a lot lately regarding his health.  Pt states that he has not felt like doing a lot lately because he has experienced SOB.  Pt reports feeling depressed: thoughts of worthlessness, hopelessness and anehdonia.  Pt denies SI/HI/AVHD.  None of these sx were present upon assessment.    Pt has seen diabetes coordinator and nutritionist while in the hospital and has received education re: proper nutrition/diabetes.  Pt was agreeable to receiving depression education as well.  Pt received depression sx and education on how to identify depression as well as resources if the pt were to want to receive counseling/OP f/u.    Pt was agreeable and appreeciated CSW assistance.  Pt was pleasant and more hopeful re: his prognosis and with the control he currently has over his disease.   Disposition:  Outpatient referral made/needed Nonnie Done, [place] [phone]  Clinical Social Work

## 2013-11-29 ENCOUNTER — Inpatient Hospital Stay (HOSPITAL_COMMUNITY)
Admission: EM | Admit: 2013-11-29 | Discharge: 2013-12-04 | DRG: 418 | Disposition: A | Discharge status: Home / Self Care | Payer: BC Managed Care – PPO | Attending: Internal Medicine | Admitting: Internal Medicine

## 2013-11-29 ENCOUNTER — Encounter (HOSPITAL_COMMUNITY): Payer: Self-pay | Admitting: Emergency Medicine

## 2013-11-29 DIAGNOSIS — E119 Type 2 diabetes mellitus without complications: Secondary | ICD-10-CM | POA: Diagnosis present

## 2013-11-29 DIAGNOSIS — K8020 Calculus of gallbladder without cholecystitis without obstruction: Secondary | ICD-10-CM | POA: Diagnosis present

## 2013-11-29 DIAGNOSIS — N179 Acute kidney failure, unspecified: Secondary | ICD-10-CM | POA: Diagnosis present

## 2013-11-29 DIAGNOSIS — K859 Acute pancreatitis without necrosis or infection, unspecified: Principal | ICD-10-CM | POA: Diagnosis present

## 2013-11-29 DIAGNOSIS — N200 Calculus of kidney: Secondary | ICD-10-CM | POA: Diagnosis present

## 2013-11-29 DIAGNOSIS — E860 Dehydration: Secondary | ICD-10-CM | POA: Diagnosis present

## 2013-11-29 DIAGNOSIS — Z79899 Other long term (current) drug therapy: Secondary | ICD-10-CM

## 2013-11-29 DIAGNOSIS — K851 Biliary acute pancreatitis without necrosis or infection: Secondary | ICD-10-CM

## 2013-11-29 DIAGNOSIS — F1010 Alcohol abuse, uncomplicated: Secondary | ICD-10-CM | POA: Diagnosis present

## 2013-11-29 DIAGNOSIS — I10 Essential (primary) hypertension: Secondary | ICD-10-CM | POA: Diagnosis present

## 2013-11-29 DIAGNOSIS — J449 Chronic obstructive pulmonary disease, unspecified: Secondary | ICD-10-CM | POA: Diagnosis present

## 2013-11-29 DIAGNOSIS — M109 Gout, unspecified: Secondary | ICD-10-CM | POA: Diagnosis present

## 2013-11-29 DIAGNOSIS — J4489 Other specified chronic obstructive pulmonary disease: Secondary | ICD-10-CM | POA: Diagnosis present

## 2013-11-29 HISTORY — DX: Calculus of kidney: N20.0

## 2013-11-29 LAB — COMPREHENSIVE METABOLIC PANEL
ALK PHOS: 85 U/L (ref 39–117)
ALT: 43 U/L (ref 0–53)
AST: 20 U/L (ref 0–37)
Albumin: 3.6 g/dL (ref 3.5–5.2)
BILIRUBIN TOTAL: 0.4 mg/dL (ref 0.3–1.2)
BUN: 21 mg/dL (ref 6–23)
CHLORIDE: 92 meq/L — AB (ref 96–112)
CO2: 22 meq/L (ref 19–32)
Calcium: 9 mg/dL (ref 8.4–10.5)
Creatinine, Ser: 1.65 mg/dL — ABNORMAL HIGH (ref 0.50–1.35)
GFR calc Af Amer: 54 mL/min — ABNORMAL LOW (ref >90)
GFR calc non Af Amer: 47 mL/min — ABNORMAL LOW (ref >90)
Glucose, Bld: 217 mg/dL — ABNORMAL HIGH (ref 70–99)
POTASSIUM: 4.7 meq/L (ref 3.7–5.3)
Sodium: 130 mEq/L — ABNORMAL LOW (ref 137–147)
Total Protein: 7.9 g/dL (ref 6.0–8.3)

## 2013-11-29 LAB — CBC WITH DIFFERENTIAL/PLATELET
BASOS ABS: 0 10*3/uL (ref 0.0–0.1)
BASOS PCT: 0 % (ref 0–1)
Eosinophils Absolute: 0.2 10*3/uL (ref 0.0–0.7)
Eosinophils Relative: 3 % (ref 0–5)
HEMATOCRIT: 39.6 % (ref 39.0–52.0)
Hemoglobin: 13.9 g/dL (ref 13.0–17.0)
LYMPHS PCT: 44 % (ref 12–46)
Lymphs Abs: 3.7 10*3/uL (ref 0.7–4.0)
MCH: 30.2 pg (ref 26.0–34.0)
MCHC: 35.1 g/dL (ref 30.0–36.0)
MCV: 86.1 fL (ref 78.0–100.0)
MONO ABS: 0.7 10*3/uL (ref 0.1–1.0)
Monocytes Relative: 8 % (ref 3–12)
NEUTROS ABS: 3.7 10*3/uL (ref 1.7–7.7)
NEUTROS PCT: 45 % (ref 43–77)
Platelets: 264 10*3/uL (ref 150–400)
RBC: 4.6 MIL/uL (ref 4.22–5.81)
RDW: 13.1 % (ref 11.5–15.5)
WBC: 8.3 10*3/uL (ref 4.0–10.5)

## 2013-11-29 LAB — URINALYSIS, ROUTINE W REFLEX MICROSCOPIC
Bilirubin Urine: NEGATIVE
Glucose, UA: NEGATIVE mg/dL
HGB URINE DIPSTICK: NEGATIVE
KETONES UR: NEGATIVE mg/dL
LEUKOCYTES UA: NEGATIVE
Nitrite: NEGATIVE
PH: 5 (ref 5.0–8.0)
Protein, ur: NEGATIVE mg/dL
Specific Gravity, Urine: 1.016 (ref 1.005–1.030)
Urobilinogen, UA: 0.2 mg/dL (ref 0.0–1.0)

## 2013-11-29 LAB — I-STAT CG4 LACTIC ACID, ED
Lactic Acid, Venous: 1.39 mmol/L (ref 0.5–2.2)
Lactic Acid, Venous: 1.51 mmol/L (ref 0.5–2.2)

## 2013-11-29 LAB — LIPASE, BLOOD: Lipase: 722 U/L — ABNORMAL HIGH (ref 11–59)

## 2013-11-29 LAB — GLUCOSE, CAPILLARY: GLUCOSE-CAPILLARY: 168 mg/dL — AB (ref 70–99)

## 2013-11-29 MED ORDER — LORAZEPAM 2 MG/ML IJ SOLN: 1.0000 mg | Freq: Four times a day (QID) | INTRAMUSCULAR | Status: AC | PRN

## 2013-11-29 MED ORDER — FOLIC ACID 1 MG PO TABS
1.0000 mg | ORAL_TABLET | Freq: Every day | ORAL | Status: DC
  Administered 2013-11-30 – 2013-12-04 (×4): 1 mg via ORAL
  Filled 2013-11-29 (×5): qty 1

## 2013-11-29 MED ORDER — ACETAMINOPHEN 650 MG RE SUPP: 650.0000 mg | Freq: Four times a day (QID) | RECTAL | Status: DC | PRN

## 2013-11-29 MED ORDER — THIAMINE HCL 100 MG/ML IJ SOLN
100.0000 mg | Freq: Every day | INTRAMUSCULAR | Status: DC
  Filled 2013-11-29 (×5): qty 1

## 2013-11-29 MED ORDER — SODIUM CHLORIDE 0.9 % IV SOLN
INTRAVENOUS | Status: DC
  Administered 2013-11-30: 06:00:00 via INTRAVENOUS

## 2013-11-29 MED ORDER — SODIUM CHLORIDE 0.9 % IV BOLUS (SEPSIS): 1000.0000 mL | Freq: Once | INTRAVENOUS | Status: DC

## 2013-11-29 MED ORDER — HYDROMORPHONE HCL PF 1 MG/ML IJ SOLN: 1.0000 mg | INTRAMUSCULAR | Status: DC | PRN

## 2013-11-29 MED ORDER — INSULIN ASPART 100 UNIT/ML ~~LOC~~ SOLN
0.0000 [IU] | Freq: Three times a day (TID) | SUBCUTANEOUS | Status: DC
  Administered 2013-11-30: 2 [IU] via SUBCUTANEOUS
  Administered 2013-11-30 – 2013-12-01 (×2): 1 [IU] via SUBCUTANEOUS
  Administered 2013-12-01: 2 [IU] via SUBCUTANEOUS
  Administered 2013-12-02 – 2013-12-04 (×5): 1 [IU] via SUBCUTANEOUS

## 2013-11-29 MED ORDER — GABAPENTIN 300 MG PO CAPS
300.0000 mg | ORAL_CAPSULE | Freq: Three times a day (TID) | ORAL | Status: DC
  Administered 2013-11-30 – 2013-12-04 (×12): 300 mg via ORAL
  Filled 2013-11-29 (×15): qty 1

## 2013-11-29 MED ORDER — ONDANSETRON HCL 4 MG/2ML IJ SOLN
4.0000 mg | Freq: Four times a day (QID) | INTRAMUSCULAR | Status: DC | PRN
  Administered 2013-12-03: 4 mg via INTRAVENOUS
  Filled 2013-11-29: qty 2

## 2013-11-29 MED ORDER — ENOXAPARIN SODIUM 40 MG/0.4ML ~~LOC~~ SOLN
40.0000 mg | SUBCUTANEOUS | Status: DC
Start: 2013-11-30 — End: 2013-11-30
  Administered 2013-11-30: 40 mg via SUBCUTANEOUS
  Filled 2013-11-29: qty 0.4

## 2013-11-29 MED ORDER — ADULT MULTIVITAMIN W/MINERALS CH
1.0000 | ORAL_TABLET | Freq: Every day | ORAL | Status: DC
  Administered 2013-11-30 – 2013-12-04 (×4): 1 via ORAL
  Filled 2013-11-29 (×5): qty 1

## 2013-11-29 MED ORDER — LORAZEPAM 1 MG PO TABS: 1.0000 mg | ORAL_TABLET | Freq: Four times a day (QID) | ORAL | Status: AC | PRN

## 2013-11-29 MED ORDER — SODIUM CHLORIDE 0.9 % IV SOLN
INTRAVENOUS | Status: AC
  Administered 2013-11-29: 23:00:00 via INTRAVENOUS

## 2013-11-29 MED ORDER — ONDANSETRON HCL 4 MG PO TABS: 4.0000 mg | ORAL_TABLET | Freq: Four times a day (QID) | ORAL | Status: DC | PRN

## 2013-11-29 MED ORDER — ONDANSETRON HCL 4 MG/2ML IJ SOLN
4.0000 mg | Freq: Once | INTRAMUSCULAR | Status: AC
  Administered 2013-11-29: 4 mg via INTRAVENOUS
  Filled 2013-11-29: qty 2

## 2013-11-29 MED ORDER — SODIUM CHLORIDE 0.9 % IJ SOLN: 3.0000 mL | Freq: Two times a day (BID) | INTRAMUSCULAR | Status: DC

## 2013-11-29 MED ORDER — IOHEXOL 300 MG/ML  SOLN
25.0000 mL | INTRAMUSCULAR | Status: AC
  Administered 2013-11-29 – 2013-11-30 (×2): 25 mL via ORAL

## 2013-11-29 MED ORDER — IPRATROPIUM-ALBUTEROL 0.5-2.5 (3) MG/3ML IN SOLN: 3.0000 mL | Freq: Four times a day (QID) | RESPIRATORY_TRACT | Status: DC | PRN

## 2013-11-29 MED ORDER — SODIUM CHLORIDE 0.9 % IV BOLUS (SEPSIS)
1000.0000 mL | Freq: Once | INTRAVENOUS | Status: AC
  Administered 2013-11-29: 1000 mL via INTRAVENOUS

## 2013-11-29 MED ORDER — VITAMIN B-1 100 MG PO TABS
100.0000 mg | ORAL_TABLET | Freq: Every day | ORAL | Status: DC
  Administered 2013-11-30 – 2013-12-04 (×4): 100 mg via ORAL
  Filled 2013-11-29 (×5): qty 1

## 2013-11-29 MED ORDER — ACETAMINOPHEN 325 MG PO TABS
650.0000 mg | ORAL_TABLET | Freq: Four times a day (QID) | ORAL | Status: DC | PRN
  Administered 2013-12-02: 650 mg via ORAL
  Filled 2013-11-29: qty 2

## 2013-11-29 NOTE — ED Notes (Addendum)
Pt reports generalized abd pain that goes through to his back, having n/v. Denies diarrhea, last bm was today. Went to pcp yesterday and tested for hpylori. No acute distress noted at triage. Decreased appetite but pain increases after eating.

## 2013-11-29 NOTE — ED Provider Notes (Signed)
CSN: [id number]     Arrival date & time 11/29/13  1708 History   First MD Initiated Contact with Patient 11/29/13 2026     Chief Complaint  Patient presents with  . Abdominal Pain     (Consider location/radiation/quality/duration/timing/severity/associated sxs/prior Treatment) The history is provided by the patient.   history of present illness: 48 year old male with history of daily alcohol use, hypertension, diabetes who presents with chief complaint of abdominal pain. Onset of symptoms was 4 days ago. Pain located in the epigastric region radiating to the back. It has been intermittent. When present pain is rated severe. Patient currently reports pain very mild in severity. On the first day of symptoms he had associated nausea and vomiting. No history of fevers. Patient was seen by his PCP yesterday who tested him for H. pylori. Symptoms were worse earlier today so patient presented to the emergency department.  Past Medical History  Diagnosis Date  . Hypertension   . Diabetes mellitus   . Gout   . Bronchitis    Past Surgical History  Procedure Laterality Date  . No past surgeries     Family History  Problem Relation Age of Onset  . Diabetes Mellitus II Father   . CAD Neg Hx    History  Substance Use Topics  . Smoking status: Never Smoker   . Smokeless tobacco: Not on file  . Alcohol Use: Yes     Comment: occasionally    Review of Systems  Constitutional: Negative for fever and chills.  HENT: Negative.   Eyes: Negative.   Respiratory: Negative for cough and shortness of breath.   Cardiovascular: Negative for chest pain and palpitations.  Gastrointestinal: Positive for nausea, vomiting and abdominal pain. Negative for diarrhea, constipation and blood in stool.  Genitourinary: Negative.   Musculoskeletal: Negative.   Skin: Negative.   Neurological: Negative.   All other systems reviewed and are negative.     Allergies  Review of patient's allergies indicates no  known allergies.  Home Medications   Prior to Admission medications   Medication Sig Start Date End Date Taking? Authorizing Provider  albuterol (PROVENTIL HFA;VENTOLIN HFA) 108 (90 BASE) MCG/ACT inhaler Inhale 2 puffs into the lungs every 6 (six) hours as needed for wheezing or shortness of breath. 07/20/13  Yes Kinnie Feil, MD  cetirizine (ZYRTEC) 10 MG tablet Take 10 mg by mouth daily.   Yes Historical Provider, MD  gabapentin (NEURONTIN) 300 MG capsule Take 300 mg by mouth 3 (three) times daily. 11/03/13  Yes Historical Provider, MD  glimepiride (AMARYL) 4 MG tablet Take 4 mg by mouth daily with breakfast.   Yes Historical Provider, MD  hydrOXYzine (ATARAX/VISTARIL) 25 MG tablet Take 25 mg by mouth daily as needed. For sleep   Yes Historical Provider, MD  ipratropium-albuterol (DUONEB) 0.5-2.5 (3) MG/3ML SOLN Take 3 mLs by nebulization every 6 (six) hours as needed. Wheezing 11/10/13  Yes Verlee Monte, MD  lisinopril-hydrochlorothiazide (PRINZIDE,ZESTORETIC) 20-25 MG per tablet Take 1 tablet by mouth daily. 04/19/12  Yes Harden Mo, MD  metFORMIN (GLUCOPHAGE) 1000 MG tablet Take 1,000 mg by mouth 2 (two) times daily with a meal.   Yes Historical Provider, MD  tadalafil (CIALIS) 20 MG tablet Take 20 mg by mouth daily as needed for erectile dysfunction.   Yes Historical Provider, MD  predniSONE (STERAPRED UNI-PAK) 10 MG tablet Take 1 tablet by mouth See admin instructions. Take 6-5-4-3-2-1 PO daily till gone. Patient completed dose a week ago from today (  11-29-13) 11/10/13   Verlee Monte, MD   BP 108/78  Pulse 94  Temp(Src) 98.4 F (36.9 C) (Oral)  Resp 21  Ht 5\' 10"  (1.778 m)  Wt 261 lb 7.5 oz (118.6 kg)  BMI 37.52 kg/m2  SpO2 100% Physical Exam  Nursing note and vitals reviewed. Constitutional: He is oriented to person, place, and time. He appears well-developed and well-nourished. No distress.  HENT:  Head: Normocephalic and atraumatic.  Eyes: Conjunctivae are normal.  Neck:  Neck supple.  Cardiovascular: Normal rate, regular rhythm, normal heart sounds and intact distal pulses.   Pulmonary/Chest: Effort normal and breath sounds normal. He has no wheezes. He has no rales.  Abdominal: Soft. He exhibits no distension. There is tenderness in the epigastric area. There is no rigidity and no guarding.  Musculoskeletal: Normal range of motion.  Neurological: He is alert and oriented to person, place, and time.  Skin: Skin is warm and dry.    ED Course  Procedures (including critical care time) Labs Review Labs Reviewed  COMPREHENSIVE METABOLIC PANEL - Abnormal; Notable for the following:    Sodium 130 (*)    Chloride 92 (*)    Glucose, Bld 217 (*)    Creatinine, Ser 1.65 (*)    GFR calc non Af Amer 47 (*)    GFR calc Af Amer 54 (*)    All other components within normal limits  LIPASE, BLOOD - Abnormal; Notable for the following:    Lipase 722 (*)    All other components within normal limits  URINALYSIS, ROUTINE W REFLEX MICROSCOPIC - Abnormal; Notable for the following:    APPearance CLOUDY (*)    All other components within normal limits  GLUCOSE, CAPILLARY - Abnormal; Notable for the following:    Glucose-Capillary 168 (*)    All other components within normal limits  CBC WITH DIFFERENTIAL  COMPREHENSIVE METABOLIC PANEL  CBC WITH DIFFERENTIAL  LIPASE, BLOOD  CBC  CREATININE, SERUM  I-STAT CG4 LACTIC ACID, ED  I-STAT CG4 LACTIC ACID, ED    Imaging Review No results found.   EKG Interpretation None      MDM   Final diagnoses:  Pancreatitis    48 year old male with history of hypertension, diabetes, alcohol use (3-4 beers per day) who presents with 4 days of intermittent epigastric abdominal pain radiating to the back with associated nausea and vomiting. Patient initially hypotensive blood pressure 90/55 with vital signs otherwise stable. Exam with very mild tenderness to palpation in the epigastric region. No guarding. Nonsurgical  abdomen.  Aging given IV fluid bolus with improvement in blood pressure. Labs remarkable for an elevated lipase of 722 consistent with acute pancreatitis. Lactic acid normal.  Hospitalist consulted and will admit for acute pancreatitis.      Renaldo Reel, MD 11/30/13 Laureen Abrahams

## 2013-11-29 NOTE — H&P (Signed)
[facility] History and Physical  Lesslie Mckeehan HMC:[id number] DOB: 28-Mar-1966 DOA: 11/29/2013  Referring physician: ER physician. PCP: [name], MD   Chief Complaint: Abdominal pain.  HPI: Troy [name] is a 48 y.o. male who was recently admitted for bronchial asthma exacerbation presented to the ER because of abdominal pain. Patient has been having abdominal pain over last 4 days. Initially the first day he had nausea and vomiting. Denies any blood in the vomitus. Denies any diarrhea. Pain is mostly in the epigastric area radiating to the back stabbing in nature constant. He had gone to his PCP and was prescribed PPIs as per the patient, yesterday. Since the pain was persistent patient came to the ER and labs reveal elevated lipase level. Patient states he used to drink a lot of alcohol previously but has cut down now. Patient probably has acute pancreatitis. Denies any chest pain shortness of breath headache visual symptoms or focal deficits.   Review of Systems: As presented in the history of presenting illness, rest negative.  Past Medical History  Diagnosis Date  . Hypertension   . Diabetes mellitus   . Gout   . Bronchitis    Past Surgical History  Procedure Laterality Date  . No past surgeries     Social History:  reports that he has never smoked. He does not have any smokeless tobacco history on file. He reports that he drinks alcohol. He reports that he does not use illicit drugs. Where does patient live home. Can patient participate in ADLs? Yes.  No Known Allergies  Family History:  Family History  Problem Relation Age of Onset  . Diabetes Mellitus II Father   . CAD Neg Hx       Prior to Admission medications   Medication Sig Start Date End Date Taking? Authorizing Provider  albuterol (PROVENTIL HFA;VENTOLIN HFA) 108 (90 BASE) MCG/ACT inhaler Inhale 2 puffs into the lungs every 6 (six) hours as needed for wheezing or shortness of breath. 07/20/13   Yes Kinnie Feil, MD  cetirizine (ZYRTEC) 10 MG tablet Take 10 mg by mouth daily.   Yes Historical Provider, MD  gabapentin (NEURONTIN) 300 MG capsule Take 300 mg by mouth 3 (three) times daily. 11/03/13  Yes Historical Provider, MD  glimepiride (AMARYL) 4 MG tablet Take 4 mg by mouth daily with breakfast.   Yes Historical Provider, MD  hydrOXYzine (ATARAX/VISTARIL) 25 MG tablet Take 25 mg by mouth daily as needed. For sleep   Yes Historical Provider, MD  ipratropium-albuterol (DUONEB) 0.5-2.5 (3) MG/3ML SOLN Take 3 mLs by nebulization every 6 (six) hours as needed. Wheezing 11/10/13  Yes Verlee Monte, MD  lisinopril-hydrochlorothiazide (PRINZIDE,ZESTORETIC) 20-25 MG per tablet Take 1 tablet by mouth daily. 04/19/12  Yes Harden Mo, MD  metFORMIN (GLUCOPHAGE) 1000 MG tablet Take 1,000 mg by mouth 2 (two) times daily with a meal.   Yes Historical Provider, MD  tadalafil (CIALIS) 20 MG tablet Take 20 mg by mouth daily as needed for erectile dysfunction.   Yes Historical Provider, MD  predniSONE (STERAPRED UNI-PAK) 10 MG tablet Take 1 tablet by mouth See admin instructions. Take 6-5-4-3-2-1 PO daily till gone. Patient completed dose a week ago from today (11-29-13) 11/10/13   Verlee Monte, MD    Physical Exam: Filed Vitals:   11/29/13 2120 11/29/13 2130 11/29/13 2145 11/29/13 2234  BP: 99/63 108/68 113/73 108/78  Pulse: 91 91 91 94  Temp:    98.4 F (36.9 C)  TempSrc:  Resp: 14 18 22 21   Height:    5\' 10"  (1.778 m)  Weight:    118.6 kg (261 lb 7.5 oz)  SpO2: 98% 100% 97% 100%     General:  Well-developed well-nourished.  Eyes: Anicteric no pallor.  ENT: No discharge from the ears eyes nose mouth.  Neck: No mass felt.  Cardiovascular: S1-S2 heard.  Respiratory: No rhonchi or crepitations.  Abdomen: Soft nontender bowel sounds present. No guarding or rigidity.  Skin: No rash.  Musculoskeletal: No edema.  Psychiatric: Appears normal.  Neurologic: Alert awake oriented to  time place and person. Moves all extremities.  Labs on Admission:  Basic Metabolic Panel:  Recent Labs Lab 11/29/13 1732  NA 130*  K 4.7  CL 92*  CO2 22  GLUCOSE 217*  BUN 21  CREATININE 1.65*  CALCIUM 9.0   Liver Function Tests:  Recent Labs Lab 11/29/13 1732  AST 20  ALT 43  ALKPHOS 85  BILITOT 0.4  PROT 7.9  ALBUMIN 3.6    Recent Labs Lab 11/29/13 1732  LIPASE 722*   No results found for this basename: AMMONIA,  in the last 168 hours CBC:  Recent Labs Lab 11/29/13 1732  WBC 8.3  NEUTROABS 3.7  HGB 13.9  HCT 39.6  MCV 86.1  PLT 264   Cardiac Enzymes: No results found for this basename: CKTOTAL, CKMB, CKMBINDEX, TROPONINI,  in the last 168 hours  BNP (last 3 results)  Recent Labs  07/18/13 0216 11/09/13 1530  PROBNP 29.7 85.6   CBG:  Recent Labs Lab 11/29/13 2231  GLUCAP 168*    Radiological Exams on Admission: No results found.   Assessment/Plan Active Problems:   Hypertension   Diabetes   Pancreatitis   Acute renal failure   Acute pancreatitis   1. Acute pancreatitis - most likely related to alcoholism. Check CT abdomen and pelvis. Since patient has renal failure cannot use IV contrast. Keep patient n.p.o. for now. Check triglyceride levels. If CAT scan is positive for gallstones we'll get sonogram of abdomen. Continued IV hydration. 2. Acute renal failure - probably secondary to dehydration. Continue with hydration and closely follow intake output and metabolic panel. 3. Diabetes mellitus - patient has been placed on sliding-scale coverage. 4. Hypertension - this may mildly hypotensive we will hold off antihypertensives. 5. History of asthma and COPD - presently not wheezing. Continue inhalers.    Code Status: Full code.  Family Communication: Wife at the bedside.  Disposition Plan: Admit to inpatient.    [facility] Pager [phone].  If 7PM-7AM, please contact  night-coverage [email or web address] Password [email or web address] 11/29/2013, 11:44 PM

## 2013-11-29 NOTE — Progress Notes (Signed)
Called ED RN for report. Room ready for admit.

## 2013-11-29 NOTE — ED Notes (Signed)
Attempted report 

## 2013-11-29 NOTE — ED Notes (Signed)
Pt reports he is pain free now but was hurting in epigastric region, chest, and middle of back.

## 2013-11-29 NOTE — ED Notes (Signed)
CG-4 result shownt to Dr. Dina Rich

## 2013-11-30 ENCOUNTER — Encounter (HOSPITAL_COMMUNITY): Payer: Self-pay | Admitting: Internal Medicine

## 2013-11-30 ENCOUNTER — Inpatient Hospital Stay (HOSPITAL_COMMUNITY): Payer: BC Managed Care – PPO

## 2013-11-30 DIAGNOSIS — K8020 Calculus of gallbladder without cholecystitis without obstruction: Secondary | ICD-10-CM

## 2013-11-30 DIAGNOSIS — N179 Acute kidney failure, unspecified: Secondary | ICD-10-CM

## 2013-11-30 DIAGNOSIS — R112 Nausea with vomiting, unspecified: Secondary | ICD-10-CM

## 2013-11-30 DIAGNOSIS — K859 Acute pancreatitis without necrosis or infection, unspecified: Secondary | ICD-10-CM

## 2013-11-30 DIAGNOSIS — R1013 Epigastric pain: Secondary | ICD-10-CM

## 2013-11-30 DIAGNOSIS — N200 Calculus of kidney: Secondary | ICD-10-CM

## 2013-11-30 HISTORY — DX: Calculus of kidney: N20.0

## 2013-11-30 LAB — CBC WITH DIFFERENTIAL/PLATELET
BASOS PCT: 0 % (ref 0–1)
Basophils Absolute: 0 10*3/uL (ref 0.0–0.1)
EOS ABS: 0.3 10*3/uL (ref 0.0–0.7)
EOS PCT: 4 % (ref 0–5)
HCT: 36.8 % — ABNORMAL LOW (ref 39.0–52.0)
HEMOGLOBIN: 13.1 g/dL (ref 13.0–17.0)
LYMPHS ABS: 3.8 10*3/uL (ref 0.7–4.0)
Lymphocytes Relative: 52 % — ABNORMAL HIGH (ref 12–46)
MCH: 30.5 pg (ref 26.0–34.0)
MCHC: 35.6 g/dL (ref 30.0–36.0)
MCV: 85.8 fL (ref 78.0–100.0)
MONO ABS: 0.5 10*3/uL (ref 0.1–1.0)
MONOS PCT: 7 % (ref 3–12)
Neutro Abs: 2.7 10*3/uL (ref 1.7–7.7)
Neutrophils Relative %: 37 % — ABNORMAL LOW (ref 43–77)
Platelets: 217 10*3/uL (ref 150–400)
RBC: 4.29 MIL/uL (ref 4.22–5.81)
RDW: 13.2 % (ref 11.5–15.5)
WBC: 7.3 10*3/uL (ref 4.0–10.5)

## 2013-11-30 LAB — COMPREHENSIVE METABOLIC PANEL
ALT: 34 U/L (ref 0–53)
AST: 15 U/L (ref 0–37)
Albumin: 3.3 g/dL — ABNORMAL LOW (ref 3.5–5.2)
Alkaline Phosphatase: 76 U/L (ref 39–117)
BUN: 22 mg/dL (ref 6–23)
CALCIUM: 8.4 mg/dL (ref 8.4–10.5)
CHLORIDE: 96 meq/L (ref 96–112)
CO2: 23 meq/L (ref 19–32)
Creatinine, Ser: 1.3 mg/dL (ref 0.50–1.35)
GFR calc Af Amer: 73 mL/min — ABNORMAL LOW (ref >90)
GFR, EST NON AFRICAN AMERICAN: 63 mL/min — AB (ref >90)
Glucose, Bld: 135 mg/dL — ABNORMAL HIGH (ref 70–99)
Potassium: 4.2 mEq/L (ref 3.7–5.3)
SODIUM: 134 meq/L — AB (ref 137–147)
Total Bilirubin: 0.4 mg/dL (ref 0.3–1.2)
Total Protein: 7.1 g/dL (ref 6.0–8.3)

## 2013-11-30 LAB — GLUCOSE, CAPILLARY
GLUCOSE-CAPILLARY: 142 mg/dL — AB (ref 70–99)
GLUCOSE-CAPILLARY: 181 mg/dL — AB (ref 70–99)
Glucose-Capillary: 141 mg/dL — ABNORMAL HIGH (ref 70–99)
Glucose-Capillary: 124 mg/dL — ABNORMAL HIGH (ref 70–99)
Glucose-Capillary: 171 mg/dL — ABNORMAL HIGH (ref 70–99)

## 2013-11-30 LAB — LIPASE, BLOOD: Lipase: 454 U/L — ABNORMAL HIGH (ref 11–59)

## 2013-11-30 LAB — LIPID PANEL
CHOL/HDL RATIO: 5.1 ratio
CHOLESTEROL: 152 mg/dL (ref 0–200)
HDL: 30 mg/dL — ABNORMAL LOW (ref >39)
LDL Cholesterol: 91 mg/dL (ref 0–99)
Triglycerides: 153 mg/dL — ABNORMAL HIGH (ref <150)
VLDL: 31 mg/dL (ref 0–40)

## 2013-11-30 MED ORDER — PANTOPRAZOLE SODIUM 40 MG PO TBEC
40.0000 mg | DELAYED_RELEASE_TABLET | Freq: Every day | ORAL | Status: DC
  Administered 2013-11-30 – 2013-12-04 (×2): 40 mg via ORAL
  Filled 2013-11-30: qty 1

## 2013-11-30 MED ORDER — SODIUM CHLORIDE 0.9 % IV SOLN
INTRAVENOUS | Status: DC
  Administered 2013-12-01 – 2013-12-04 (×3): via INTRAVENOUS
  Filled 2013-11-30: qty 1000

## 2013-11-30 MED ORDER — ENOXAPARIN SODIUM 40 MG/0.4ML ~~LOC~~ SOLN
40.0000 mg | SUBCUTANEOUS | Status: DC
  Administered 2013-12-01 – 2013-12-03 (×2): 40 mg via SUBCUTANEOUS
  Filled 2013-11-30 (×4): qty 0.4

## 2013-11-30 MED ORDER — PANTOPRAZOLE SODIUM 40 MG PO TBEC: 40.0000 mg | DELAYED_RELEASE_TABLET | Freq: Every day | ORAL | Status: DC

## 2013-11-30 NOTE — Progress Notes (Signed)
Nutrition Brief Note  Patient identified on the Malnutrition Screening Tool (MST) Report. Pt reports that he has had poor po intake x 4 days, however he is hungry and ready to eat, however he is NPO. Pt appears well-nourished with no significant fat or muscle mass loss on exam. Pt's weight stable, as shown below. Pt educated by RD during previous admission on DM, pt denies need for further education at this time. Encouraged pt to ask RN for RD to return in the event he does have questions. Pt agreeable.  Wt Readings from Last 15 Encounters:  11/29/13 261 lb 7.5 oz (118.6 kg)  11/09/13 269 lb 2.9 oz (122.1 kg)  07/19/13 251 lb 4.8 oz (113.989 kg)    Body mass index is 37.52 kg/(m^2). Patient meets criteria for Obese Class II based on current BMI.   Current diet order is NPO. Labs and medications reviewed.   No nutrition interventions warranted at this time. If nutrition issues arise, please consult RD.   Inda Coke MS, RD, LDN Inpatient Registered Dietitian Pager: [phone] After-hours pager: [phone]

## 2013-11-30 NOTE — Consult Note (Addendum)
Troy [name] Dec 04, 1965  [id number].    Primary Care MD: [name], MD Requesting MD: Irine Seal  Chief Complaint/Reason for Consult: Abdominal pain, pancreatitis, cholelithiasis; concern for gallstone pancreatitis  HPI:  48 year old male with history of asthma and DM-2 presented to the ED on 5/5 with complaints of abdominal pain.  Patient reports that his pain started on 5/1 after eating a meal.  Shortly after his meal he had severe epigastric pain which radiated to his back.  He also had associated nausea and vomiting.  Emesis was nonbloody and nonbilious.  Pain continued to persist over the next few days, particularly after meals.  With persistent pain, he decided to come in for evaluation.    Laboratory workup revealed elevated lipase of 722, normal LFT's and bilirubin, elevated creatinine of 1.65 and normal WBC count of 8.3.  CT scan revealed findings consistent with acute pancreatitis as well as cholelithiasis and nephrolithiasis.  US revealed similar findings.  ROS Per HPI with the following additions: no fever, chills, hematemesis, melena/hematochezia, constipation.  He does endorse some diarrhea.    Family History  Problem Relation Age of Onset  . Diabetes Mellitus II Father   . CAD Neg Hx     Past Medical History  Diagnosis Date  . Hypertension   . Diabetes mellitus   . Gout   . Bronchitis   . Kidney stones 11/30/2013    Past Surgical History  Procedure Laterality Date  . No past surgeries      Social History:  reports that he has never smoked. He does not have any smokeless tobacco history on file. He reports that he drinks alcohol. He reports that he does not use illicit drugs. - Patient drinks 1 case of beer/week.  Allergies: No Known Allergies  Medications Prior to Admission  Medication Sig Dispense Refill  . albuterol (PROVENTIL HFA;VENTOLIN HFA) 108 (90 BASE) MCG/ACT inhaler Inhale 2 puffs into the lungs every 6 (six) hours as needed for  wheezing or shortness of breath.  1 Inhaler  2  . cetirizine (ZYRTEC) 10 MG tablet Take 10 mg by mouth daily.      Marland Kitchen gabapentin (NEURONTIN) 300 MG capsule Take 300 mg by mouth 3 (three) times daily.      Marland Kitchen glimepiride (AMARYL) 4 MG tablet Take 4 mg by mouth daily with breakfast.      . hydrOXYzine (ATARAX/VISTARIL) 25 MG tablet Take 25 mg by mouth daily as needed. For sleep      . ipratropium-albuterol (DUONEB) 0.5-2.5 (3) MG/3ML SOLN Take 3 mLs by nebulization every 6 (six) hours as needed. Wheezing      . lisinopril-hydrochlorothiazide (PRINZIDE,ZESTORETIC) 20-25 MG per tablet Take 1 tablet by mouth daily.  30 tablet  0  . metFORMIN (GLUCOPHAGE) 1000 MG tablet Take 1,000 mg by mouth 2 (two) times daily with a meal.      . tadalafil (CIALIS) 20 MG tablet Take 20 mg by mouth daily as needed for erectile dysfunction.      . predniSONE (STERAPRED UNI-PAK) 10 MG tablet Take 1 tablet by mouth See admin instructions. Take 6-5-4-3-2-1 PO daily till gone. Patient completed dose a week ago from today (11-29-13)       Blood pressure 119/79, pulse 87, temperature 98.1 F (36.7 C), temperature source Oral, resp. rate 18, height '5\' 10"'  (1.778 m), weight 261 lb 7.5 oz (118.6 kg), SpO2 98.00%. Physical Exam: Exam: General: well appearing gentleman in NAD.  HEENT: NCAT. No scleral icterus. Cardiovascular: RRR.  No murmurs, rubs, or gallops. Respiratory: CTAB. No rales, rhonchi, or wheeze. Abdomen: soft, nontender, nondistended.  Neg Murphy's sign.  No guarding or rebound. +BS Extremities: No LE edema.  Results for orders placed during the hospital encounter of 11/29/13 (from the past 48 hour(s))  CBC WITH DIFFERENTIAL     Status: None   Collection Time    11/29/13  5:32 PM      Result Value Ref Range   WBC 8.3  4.0 - 10.5 K/uL   RBC 4.60  4.22 - 5.81 MIL/uL   Hemoglobin 13.9  13.0 - 17.0 g/dL   HCT 39.6  39.0 - 52.0 %   MCV 86.1  78.0 - 100.0 fL   MCH 30.2  26.0 - 34.0 pg   MCHC 35.1  30.0 - 36.0 g/dL    RDW 13.1  11.5 - 15.5 %   Platelets 264  150 - 400 K/uL   Neutrophils Relative % 45  43 - 77 %   Neutro Abs 3.7  1.7 - 7.7 K/uL   Lymphocytes Relative 44  12 - 46 %   Lymphs Abs 3.7  0.7 - 4.0 K/uL   Monocytes Relative 8  3 - 12 %   Monocytes Absolute 0.7  0.1 - 1.0 K/uL   Eosinophils Relative 3  0 - 5 %   Eosinophils Absolute 0.2  0.0 - 0.7 K/uL   Basophils Relative 0  0 - 1 %   Basophils Absolute 0.0  0.0 - 0.1 K/uL  COMPREHENSIVE METABOLIC PANEL     Status: Abnormal   Collection Time    11/29/13  5:32 PM      Result Value Ref Range   Sodium 130 (*) 137 - 147 mEq/L   Potassium 4.7  3.7 - 5.3 mEq/L   Chloride 92 (*) 96 - 112 mEq/L   CO2 22  19 - 32 mEq/L   Glucose, Bld 217 (*) 70 - 99 mg/dL   BUN 21  6 - 23 mg/dL   Creatinine, Ser 1.65 (*) 0.50 - 1.35 mg/dL   Calcium 9.0  8.4 - 10.5 mg/dL   Total Protein 7.9  6.0 - 8.3 g/dL   Albumin 3.6  3.5 - 5.2 g/dL   AST 20  0 - 37 U/L   ALT 43  0 - 53 U/L   Alkaline Phosphatase 85  39 - 117 U/L   Total Bilirubin 0.4  0.3 - 1.2 mg/dL   GFR calc non Af Amer 47 (*) >90 mL/min   GFR calc Af Amer 54 (*) >90 mL/min   Comment: (NOTE)     The eGFR has been calculated using the CKD EPI equation.     This calculation has not been validated in all clinical situations.     eGFR's persistently <90 mL/min signify possible Chronic Kidney     Disease.  LIPASE, BLOOD     Status: Abnormal   Collection Time    11/29/13  5:32 PM      Result Value Ref Range   Lipase 722 (*) 11 - 59 U/L  I-STAT CG4 LACTIC ACID, ED     Status: None   Collection Time    11/29/13  8:52 PM      Result Value Ref Range   Lactic Acid, Venous 1.39  0.5 - 2.2 mmol/L  URINALYSIS, ROUTINE W REFLEX MICROSCOPIC     Status: Abnormal   Collection Time    11/29/13  9:17 PM      Result  Value Ref Range   Color, Urine YELLOW  YELLOW   APPearance CLOUDY (*) CLEAR   Specific Gravity, Urine 1.016  1.005 - 1.030   pH 5.0  5.0 - 8.0   Glucose, UA NEGATIVE  NEGATIVE mg/dL   Hgb  urine dipstick NEGATIVE  NEGATIVE   Bilirubin Urine NEGATIVE  NEGATIVE   Ketones, ur NEGATIVE  NEGATIVE mg/dL   Protein, ur NEGATIVE  NEGATIVE mg/dL   Urobilinogen, UA 0.2  0.0 - 1.0 mg/dL   Nitrite NEGATIVE  NEGATIVE   Leukocytes, UA NEGATIVE  NEGATIVE   Comment: MICROSCOPIC NOT DONE ON URINES WITH NEGATIVE PROTEIN, BLOOD, LEUKOCYTES, NITRITE, OR GLUCOSE <1000 mg/dL.  I-STAT CG4 LACTIC ACID, ED     Status: None   Collection Time    11/29/13  9:32 PM      Result Value Ref Range   Lactic Acid, Venous 1.51  0.5 - 2.2 mmol/L  GLUCOSE, CAPILLARY     Status: Abnormal   Collection Time    11/29/13 10:31 PM      Result Value Ref Range   Glucose-Capillary 168 (*) 70 - 99 mg/dL  GLUCOSE, CAPILLARY     Status: Abnormal   Collection Time    11/30/13  5:37 AM      Result Value Ref Range   Glucose-Capillary 142 (*) 70 - 99 mg/dL  COMPREHENSIVE METABOLIC PANEL     Status: Abnormal   Collection Time    11/30/13  6:44 AM      Result Value Ref Range   Sodium 134 (*) 137 - 147 mEq/L   Potassium 4.2  3.7 - 5.3 mEq/L   Chloride 96  96 - 112 mEq/L   CO2 23  19 - 32 mEq/L   Glucose, Bld 135 (*) 70 - 99 mg/dL   BUN 22  6 - 23 mg/dL   Creatinine, Ser 1.30  0.50 - 1.35 mg/dL   Calcium 8.4  8.4 - 10.5 mg/dL   Total Protein 7.1  6.0 - 8.3 g/dL   Albumin 3.3 (*) 3.5 - 5.2 g/dL   AST 15  0 - 37 U/L   ALT 34  0 - 53 U/L   Alkaline Phosphatase 76  39 - 117 U/L   Total Bilirubin 0.4  0.3 - 1.2 mg/dL   GFR calc non Af Amer 63 (*) >90 mL/min   GFR calc Af Amer 73 (*) >90 mL/min   Comment: (NOTE)     The eGFR has been calculated using the CKD EPI equation.     This calculation has not been validated in all clinical situations.     eGFR's persistently <90 mL/min signify possible Chronic Kidney     Disease.  CBC WITH DIFFERENTIAL     Status: Abnormal   Collection Time    11/30/13  6:44 AM      Result Value Ref Range   WBC 7.3  4.0 - 10.5 K/uL   RBC 4.29  4.22 - 5.81 MIL/uL   Hemoglobin 13.1  13.0 -  17.0 g/dL   HCT 36.8 (*) 39.0 - 52.0 %   MCV 85.8  78.0 - 100.0 fL   MCH 30.5  26.0 - 34.0 pg   MCHC 35.6  30.0 - 36.0 g/dL   RDW 13.2  11.5 - 15.5 %   Platelets 217  150 - 400 K/uL   Neutrophils Relative % 37 (*) 43 - 77 %   Neutro Abs 2.7  1.7 - 7.7 K/uL   Lymphocytes  Relative 52 (*) 12 - 46 %   Lymphs Abs 3.8  0.7 - 4.0 K/uL   Monocytes Relative 7  3 - 12 %   Monocytes Absolute 0.5  0.1 - 1.0 K/uL   Eosinophils Relative 4  0 - 5 %   Eosinophils Absolute 0.3  0.0 - 0.7 K/uL   Basophils Relative 0  0 - 1 %   Basophils Absolute 0.0  0.0 - 0.1 K/uL  LIPASE, BLOOD     Status: Abnormal   Collection Time    11/30/13  6:44 AM      Result Value Ref Range   Lipase 454 (*) 11 - 59 U/L  LIPID PANEL     Status: Abnormal   Collection Time    11/30/13  6:44 AM      Result Value Ref Range   Cholesterol 152  0 - 200 mg/dL   Triglycerides 153 (*) <150 mg/dL   HDL 30 (*) >39 mg/dL   Total CHOL/HDL Ratio 5.1     VLDL 31  0 - 40 mg/dL   LDL Cholesterol 91  0 - 99 mg/dL   Comment:            Total Cholesterol/HDL:CHD Risk     Coronary Heart Disease Risk Table                         Men   Women      1/2 Average Risk   3.4   3.3      Average Risk       5.0   4.4      2 X Average Risk   9.6   7.1      3 X Average Risk  23.4   11.0                Use the calculated Patient Ratio     above and the CHD Risk Table     to determine the patient's CHD Risk.                ATP III CLASSIFICATION (LDL):      <100     mg/dL   Optimal      100-129  mg/dL   Near or Above                        Optimal      130-159  mg/dL   Borderline      160-189  mg/dL   High      >190     mg/dL   Very High  GLUCOSE, CAPILLARY     Status: Abnormal   Collection Time    11/30/13  7:53 AM      Result Value Ref Range   Glucose-Capillary 141 (*) 70 - 99 mg/dL  GLUCOSE, CAPILLARY     Status: Abnormal   Collection Time    11/30/13 11:33 AM      Result Value Ref Range   Glucose-Capillary 124 (*) 70 - 99 mg/dL    Ct Abdomen Pelvis Wo Contrast  11/30/2013   CLINICAL DATA:  Abdominal pain and elevated lipase  EXAM: CT ABDOMEN AND PELVIS WITHOUT CONTRAST  TECHNIQUE: Multidetector CT imaging of the abdomen and pelvis was performed following the standard protocol without IV contrast.  COMPARISON:  04/01/2004.  FINDINGS: BODY WALL: Right larger than left fatty inguinal hernias.  LOWER CHEST: Unremarkable.  ABDOMEN/PELVIS:  Liver:  No focal abnormality.  Biliary: Layering cholelithiasis. Gallbladder is distended but does not appear inflamed.  Pancreas: Expansion of the pancreatic head and uncinate process with peripancreatic edema. No loculated collection. No peripancreatic hemorrhage. No ductal dilatation.  Spleen: Unremarkable.  Adrenals: Unremarkable.  Kidneys and ureters: Bilateral nephrolithiasis, the largest stone in the right lower pole, 11 mm. No hydronephrosis or ureteral calculus.  Bladder: Decompressed.  Reproductive: Unremarkable.  Bowel: No obstruction. Normal appendix.  Retroperitoneum: Chronic inguinal lymphadenopathy, also seeing in 2005. The largest node is in the right external iliac chain, measuring 11 mm short axis.  Peritoneum: No free fluid or gas.  Vascular: No acute abnormality.  OSSEOUS: Lipomatosis changes in the region of the right iliopsoas distal attachment.  IMPRESSION: 1. Acute pancreatitis.  No fluid collection. 2. Cholelithiasis. 3. Bilateral nephrolithiasis.   Electronically Signed   By: Jorje Guild M.D.   On: 11/30/2013 02:47   US Abdomen Complete  11/30/2013   CLINICAL DATA:  Pancreatitis.  Abdominal pain.  EXAM: ULTRASOUND ABDOMEN COMPLETE  COMPARISON:  CT abdomen and pelvis 11/30/2013  FINDINGS: Gallbladder:  Multiple small stones without wall thickening. No sonographic Murphy sign noted.  Common bile duct:  Diameter: 3.9 mm  Liver:  Diffusely increased echogenicity without focal lesion identified.  IVC:  No abnormality visualized.  Pancreas:  Not well visualized. Visualized portion of  the head appears hypoechoic.  Spleen:  Size and appearance within normal limits.  Right Kidney:  Length: 11.5 cm.  1.5 cm lower pole stone.  No hydronephrosis.  Left Kidney:  Length: 12.5 cm. 1.5 cm linear echogenic focus in the lower pole may reflect a stone but there was no clear posterior acoustic shadowing. No hydronephrosis.  Abdominal aorta:  No aneurysm visualized.  Other findings:  None.  IMPRESSION: 1. Pancreas not well visualized, although hypoechoic appearance of visualized portion is compatible with acute pancreatitis. 2. No biliary dilatation. 3. Cholelithiasis.  No evidence of acute cholecystitis. 4. Increased echogenicity of the liver, suggestive of steatosis. 5. Nephrolithiasis without hydronephrosis.   Electronically Signed   By: Logan Bores   On: 11/30/2013 08:01   Assessment/Plan 48 year old male with DM, Asthma and alcohol abuse who presented with abdominal pain.  Work up revealed acute pancreatitis and cholelithiasis.  1) Pancreatitis, ? Gallstone pancreatitis  The pancreatitis is "minimal" on CT. 2) Cholelithiasis 3) AKI 4) HTN 5) DM-2 6) Nephrolithiasis. bilateral 7) Asthma  8) Alcohol abuse  Plan: - No evidence of cholecystitis on CT & [place]; however, given presence of cholelithiasis pancreatitis could be secondary to gallstones.  I do suspect that alcohol abuse is the more likely cause. - Given gallstones and potential recurrence/worsening of pancreatitis in the future, will plan to proceed with cholecystectomy after discussion with attending Dr. Chauncy Passy.  Coral Spikes 11/30/2013, 1:28 PM Pager: [phone]  Patient's symptoms sound c/w gallstone pancreatitis.  He does drink ETOH, but given post prandial pain, it's likely this is secondary to gallstones.  We will plan on cholecystectomy within in the next 1-2 days after pancreatitis resolves.  Given ETOH hx, we will check a PT/INR to make sure this is normal.  NPO p MN in case we can proceed tomorrow.  Agree with  above. Patient's history of EtOH could be source of pancreatitis.  But gallstones is a correctable cause of the pancreatitis. I discussed with him about going ahead with surgery.  I also discussed that if I have too many cases, I may not be able to do him  tomorrow and he could go home and schedule the surgery electively in 2 or 3 weeks.  The patient's wife is in the room with him. He works at [employer].  I discussed with the patient the indications and risks of gall bladder surgery.  The primary risks of gall bladder surgery include, but are not limited to, bleeding, infection, common bile duct injury, and open surgery. We discussed the typical post-operative recovery course. I tried to answer the patient's questions. I also discussed the potential roll of EtOH in pancreatitis.  Alphonsa Overall, MD, [facility] Surgery Pager: [phone] Office phone:  [phone]

## 2013-11-30 NOTE — Progress Notes (Signed)
UR completed 

## 2013-11-30 NOTE — Progress Notes (Signed)
[facility] PROGRESS NOTE  Troy [name] TJQ:[id number] DOB: 07-Feb-1966 DOA: 11/29/2013 PCP: [name], MD  Assessment/Plan: #1 acute pancreatitis Likely secondary to gallstone pancreatitis plus or minus alcohol use however patient states does not drink that much alcohol drinks about one to 2 beers a day. CT of the abdomen and pelvis as well as abdominal ultrasound consistent with cholelithiasis without evidence of acute cholecystitis or biliary dilatation. Clinical improvement. Lipase levels improving and lipase now at 454 from 722 on admission. Fasting lipid panel with a triglyceride level of 153. Patient currently tolerating clear liquids. Continue IV fluids. Advance diet to full liquid diet. Consult with general surgery for possible cholecystectomy. Continue supportive care. Follow.  #2 acute renal failure Likely secondary to prerenal azotemia. Improved with hydration. Abdominal ultrasound with nephrolithiasis without hydronephrosis. Will follow.  #3 cholelithiasis General surgical consultation pending as patient had presented with acute pancreatitis which have been felt to be secondary to gallstone pancreatitis.  #4 nephrolithiasis Currently asymptomatic. Outpatient followup.  #5 hypertension Stable. Antihypertensive medications on hold. Follow for now.  #6 history of asthma COPD Stable. Continue inhalers.  #7 diabetes mellitus Hemoglobin A1c was 7.0 11/09/2013. CBGs have ranged from 124-168. We'll place on sliding scale insulin. Follow.  #8 history of alcohol use Currently stable. No signs of withdrawal. Continue thiamine, folic acid, Ativan CIWA protocol  #9 prophylaxis PPI for GI prophylaxis. Lovenox for DVT prophylaxis.  Code Status: Full Family Communication: Updated patient no family at bedside. Disposition Plan: Home in medically stable.   Consultants:  None  Procedures:  CT abdomen and pelvis 11/30/2013  Abdominal ultrasound  11/30/2013  Antibiotics:  None  HPI/Subjective: Patient states no further abdominal pain. Patient with no emesis. Patient tolerating clear liquids.  Objective: Filed Vitals:   11/30/13 1134  BP: 119/79  Pulse: 87  Temp: 98.1 F (36.7 C)  Resp: 18    Intake/Output Summary (Last 24 hours) at 11/30/13 1203 Last data filed at 11/30/13 0800  Gross per 24 hour  Intake 2690.83 ml  Output      0 ml  Net 2690.83 ml   Filed Weights   11/29/13 2234  Weight: 118.6 kg (261 lb 7.5 oz)    Exam:   General:  NAD  Cardiovascular: RRR  Respiratory: CTAB  Abdomen: Soft, nontender, nondistended, positive bowel sounds.  Musculoskeletal: No clubbing cyanosis or edema.  Data Reviewed: Basic Metabolic Panel:  Recent Labs Lab 11/29/13 1732 11/30/13 0644  NA 130* 134*  K 4.7 4.2  CL 92* 96  CO2 22 23  GLUCOSE 217* 135*  BUN 21 22  CREATININE 1.65* 1.30  CALCIUM 9.0 8.4   Liver Function Tests:  Recent Labs Lab 11/29/13 1732 11/30/13 0644  AST 20 15  ALT 43 34  ALKPHOS 85 76  BILITOT 0.4 0.4  PROT 7.9 7.1  ALBUMIN 3.6 3.3*    Recent Labs Lab 11/29/13 1732 11/30/13 0644  LIPASE 722* 454*   No results found for this basename: AMMONIA,  in the last 168 hours CBC:  Recent Labs Lab 11/29/13 1732 11/30/13 0644  WBC 8.3 7.3  NEUTROABS 3.7 2.7  HGB 13.9 13.1  HCT 39.6 36.8*  MCV 86.1 85.8  PLT 264 217   Cardiac Enzymes: No results found for this basename: CKTOTAL, CKMB, CKMBINDEX, TROPONINI,  in the last 168 hours BNP (last 3 results)  Recent Labs  07/18/13 0216 11/09/13 1530  PROBNP 29.7 85.6   CBG:  Recent Labs Lab 11/29/13 2231 11/30/13 0537 11/30/13 0753 11/30/13  [place]    No results found for this or any previous visit (from the past 240 hour(s)).   Studies: Ct Abdomen Pelvis Wo Contrast  11/30/2013   CLINICAL DATA:  Abdominal pain and elevated lipase  EXAM: CT ABDOMEN AND PELVIS WITHOUT CONTRAST   TECHNIQUE: Multidetector CT imaging of the abdomen and pelvis was performed following the standard protocol without IV contrast.  COMPARISON:  04/01/2004.  FINDINGS: BODY WALL: Right larger than left fatty inguinal hernias.  LOWER CHEST: Unremarkable.  ABDOMEN/PELVIS:  Liver: No focal abnormality.  Biliary: Layering cholelithiasis. Gallbladder is distended but does not appear inflamed.  Pancreas: Expansion of the pancreatic head and uncinate process with peripancreatic edema. No loculated collection. No peripancreatic hemorrhage. No ductal dilatation.  Spleen: Unremarkable.  Adrenals: Unremarkable.  Kidneys and ureters: Bilateral nephrolithiasis, the largest stone in the right lower pole, 11 mm. No hydronephrosis or ureteral calculus.  Bladder: Decompressed.  Reproductive: Unremarkable.  Bowel: No obstruction. Normal appendix.  Retroperitoneum: Chronic inguinal lymphadenopathy, also seeing in 2005. The largest node is in the right external iliac chain, measuring 11 mm short axis.  Peritoneum: No free fluid or gas.  Vascular: No acute abnormality.  OSSEOUS: Lipomatosis changes in the region of the right iliopsoas distal attachment.  IMPRESSION: 1. Acute pancreatitis.  No fluid collection. 2. Cholelithiasis. 3. Bilateral nephrolithiasis.   Electronically Signed   By: Jorje Guild M.D.   On: 11/30/2013 02:47   US Abdomen Complete  11/30/2013   CLINICAL DATA:  Pancreatitis.  Abdominal pain.  EXAM: ULTRASOUND ABDOMEN COMPLETE  COMPARISON:  CT abdomen and pelvis 11/30/2013  FINDINGS: Gallbladder:  Multiple small stones without wall thickening. No sonographic Murphy sign noted.  Common bile duct:  Diameter: 3.9 mm  Liver:  Diffusely increased echogenicity without focal lesion identified.  IVC:  No abnormality visualized.  Pancreas:  Not well visualized. Visualized portion of the head appears hypoechoic.  Spleen:  Size and appearance within normal limits.  Right Kidney:  Length: 11.5 cm.  1.5 cm lower pole stone.  No  hydronephrosis.  Left Kidney:  Length: 12.5 cm. 1.5 cm linear echogenic focus in the lower pole may reflect a stone but there was no clear posterior acoustic shadowing. No hydronephrosis.  Abdominal aorta:  No aneurysm visualized.  Other findings:  None.  IMPRESSION: 1. Pancreas not well visualized, although hypoechoic appearance of visualized portion is compatible with acute pancreatitis. 2. No biliary dilatation. 3. Cholelithiasis.  No evidence of acute cholecystitis. 4. Increased echogenicity of the liver, suggestive of steatosis. 5. Nephrolithiasis without hydronephrosis.   Electronically Signed   By: Logan Bores   On: 11/30/2013 08:01    Scheduled Meds: . enoxaparin (LOVENOX) injection  40 mg Subcutaneous Q24H  . folic acid  1 mg Oral Daily  . gabapentin  300 mg Oral TID  . insulin aspart  0-9 Units Subcutaneous TID WC  . multivitamin with minerals  1 tablet Oral Daily  . pantoprazole  40 mg Oral Q0600  . sodium chloride  3 mL Intravenous Q12H  . thiamine  100 mg Oral Daily   Or  . thiamine  100 mg Intravenous Daily   Continuous Infusions: . sodium chloride 0.9 % 1,000 mL infusion 150 mL/hr at 11/30/13 0800    Principal Problem:   Acute pancreatitis Active Problems:   Hypertension   Diabetes   Acute renal failure   Kidney stones   Cholelithiasis: PER CT ABD/PELVIS AND ABD [place] 11/30/13  Time spent: 35 minutes    Eugenie Filler M.D.  [facility] Pager [phone]. If 7PM-7AM, please contact night-coverage at [email or web address], password [facility] 11/30/2013, 12:03 PM  LOS: 1 day

## 2013-11-30 NOTE — ED Provider Notes (Signed)
I saw and evaluated the patient, reviewed the resident's note and I agree with the findings and plan.   EKG Interpretation None      Patient presents with abdominal pain.  3-4 days.  Noted to have soft blood pressures upon arrival.  Endorses past heavy alcohol use but denies recent binging.  Noted to have elevated lipase and acute pancreatitis.  Will admit to the hospitalist. Gen:  AOx3 HEENT: Normocephalic, atraumatic Card:  RRR, no m/r/g Pulm: CTAB Abd: Soft, epigastric TTP    Merryl Hacker, MD 11/30/13 1535

## 2013-12-01 LAB — COMPREHENSIVE METABOLIC PANEL
ALT: 33 U/L (ref 0–53)
AST: 18 U/L (ref 0–37)
Albumin: 3.3 g/dL — ABNORMAL LOW (ref 3.5–5.2)
Alkaline Phosphatase: 76 U/L (ref 39–117)
BUN: 15 mg/dL (ref 6–23)
CO2: 22 meq/L (ref 19–32)
CREATININE: 0.86 mg/dL (ref 0.50–1.35)
Calcium: 8.5 mg/dL (ref 8.4–10.5)
Chloride: 104 mEq/L (ref 96–112)
GFR calc Af Amer: >90 mL/min (ref >90)
GFR calc non Af Amer: >90 mL/min (ref >90)
Glucose, Bld: 129 mg/dL — ABNORMAL HIGH (ref 70–99)
Potassium: 4.5 mEq/L (ref 3.7–5.3)
SODIUM: 140 meq/L (ref 137–147)
TOTAL PROTEIN: 7.2 g/dL (ref 6.0–8.3)
Total Bilirubin: 0.4 mg/dL (ref 0.3–1.2)

## 2013-12-01 LAB — CBC
HEMATOCRIT: 35.8 % — AB (ref 39.0–52.0)
HEMOGLOBIN: 12.6 g/dL — AB (ref 13.0–17.0)
MCH: 30.4 pg (ref 26.0–34.0)
MCHC: 35.2 g/dL (ref 30.0–36.0)
MCV: 86.5 fL (ref 78.0–100.0)
Platelets: 214 10*3/uL (ref 150–400)
RBC: 4.14 MIL/uL — AB (ref 4.22–5.81)
RDW: 13.1 % (ref 11.5–15.5)
WBC: 6.3 10*3/uL (ref 4.0–10.5)

## 2013-12-01 LAB — SURGICAL PCR SCREEN
MRSA, PCR: NEGATIVE
Staphylococcus aureus: POSITIVE — AB

## 2013-12-01 LAB — PROTIME-INR
INR: 0.93 (ref 0.00–1.49)
PROTHROMBIN TIME: 12.3 s (ref 11.6–15.2)

## 2013-12-01 LAB — GLUCOSE, CAPILLARY
Glucose-Capillary: 128 mg/dL — ABNORMAL HIGH (ref 70–99)
Glucose-Capillary: 107 mg/dL — ABNORMAL HIGH (ref 70–99)
Glucose-Capillary: 184 mg/dL — ABNORMAL HIGH (ref 70–99)
Glucose-Capillary: 191 mg/dL — ABNORMAL HIGH (ref 70–99)

## 2013-12-01 LAB — LIPASE, BLOOD: Lipase: 206 U/L — ABNORMAL HIGH (ref 11–59)

## 2013-12-01 MED ORDER — MUPIROCIN 2 % EX OINT
1.0000 "application " | TOPICAL_OINTMENT | Freq: Two times a day (BID) | CUTANEOUS | Status: DC
  Administered 2013-12-01 – 2013-12-04 (×6): 1 via NASAL
  Filled 2013-12-01: qty 22

## 2013-12-01 NOTE — Progress Notes (Signed)
[facility] PROGRESS NOTE  Troy [name] GGY:[id number] DOB: 1977/02/13 DOA: 11/29/2013 PCP: [name], MD  Assessment/Plan: #1 acute pancreatitis Likely secondary to gallstone pancreatitis plus or minus alcohol use however patient states does not drink that much alcohol drinks about one to 2 beers a day. CT of the abdomen and pelvis as well as abdominal ultrasound consistent with cholelithiasis without evidence of acute cholecystitis or biliary dilatation. Clinical improvement. Lipase levels improving and lipase now at 206 from 454 from 722 on admission. Fasting lipid panel with a triglyceride level of 153. Patient currently tolerating full liquids. Continue IV fluids. Patient has been seen by general surgery and patient for probable laparoscopic cholecystectomy cystectomy this afternoon. Appreciate general surgery input and recommendations.  #2 acute renal failure Likely secondary to prerenal azotemia. Improved with hydration. Abdominal ultrasound with nephrolithiasis without hydronephrosis. Will follow.  #3 cholelithiasis General surgical consultation has been obtained and recommended laparoscopic cholecystectomy as patient had presented with acute pancreatitis which may have been secondary to gallstone pancreatitis.  #4 nephrolithiasis Currently asymptomatic. Outpatient followup.  #5 hypertension Stable. Antihypertensive medications on hold. Follow for now.  #6 history of asthma COPD Stable. Continue inhalers.  #7 diabetes mellitus Hemoglobin A1c was 7.0 11/09/2013. CBGs have ranged from 107-128. Continue sliding scale insulin. Follow.  #8 history of alcohol use Currently stable. No signs of withdrawal. Continue thiamine, folic acid, Ativan CIWA protocol  #9 prophylaxis PPI for GI prophylaxis. Lovenox for DVT prophylaxis.  Code Status: Full Family Communication: Updated patient no family at bedside. Disposition Plan: Home in medically  stable.   Consultants:  General Surgery : Dr Lucia Gaskins 11/30/13  Procedures:  CT abdomen and pelvis 11/30/2013  Abdominal ultrasound 11/30/2013  Antibiotics:  None  HPI/Subjective: Patient states no further abdominal pain. Patient with no emesis. Patient tolerating full liquids.  Objective: Filed Vitals:   12/01/13 1135  BP: 123/85  Pulse: 81  Temp: 98.1 F (36.7 C)  Resp: 18    Intake/Output Summary (Last 24 hours) at 12/01/13 1223 Last data filed at 12/01/13 0600  Gross per 24 hour  Intake   2810 ml  Output    700 ml  Net   2110 ml   Filed Weights   11/29/13 2234 11/30/13 2056  Weight: 118.6 kg (261 lb 7.5 oz) 118.75 kg (261 lb 12.7 oz)    Exam:   General:  NAD  Cardiovascular: RRR  Respiratory: CTAB  Abdomen: Soft, nontender, nondistended, positive bowel sounds.  Musculoskeletal: No clubbing cyanosis or edema.  Data Reviewed: Basic Metabolic Panel:  Recent Labs Lab 11/29/13 1732 11/30/13 0644 12/01/13 0545  NA 130* 134* 140  K 4.7 4.2 4.5  CL 92* 96 104  CO2 22 23 22   GLUCOSE 217* 135* 129*  BUN 21 22 15   CREATININE 1.65* 1.30 0.86  CALCIUM 9.0 8.4 8.5   Liver Function Tests:  Recent Labs Lab 11/29/13 1732 11/30/13 0644 12/01/13 0545  AST 20 15 18   ALT 43 34 33  ALKPHOS 85 76 76  BILITOT 0.4 0.4 0.4  PROT 7.9 7.1 7.2  ALBUMIN 3.6 3.3* 3.3*    Recent Labs Lab 11/29/13 1732 11/30/13 0644 12/01/13 0545  LIPASE 722* 454* 206*   No results found for this basename: AMMONIA,  in the last 168 hours CBC:  Recent Labs Lab 11/29/13 1732 11/30/13 0644 12/01/13 0545  WBC 8.3 7.3 6.3  NEUTROABS 3.7 2.7  --   HGB 13.9 13.1 12.6*  HCT 39.6 36.8* 35.8*  MCV 86.1 85.8 86.5  PLT 264 217 214   Cardiac Enzymes: No results found for this basename: CKTOTAL, CKMB, CKMBINDEX, TROPONINI,  in the last 168 hours BNP (last 3 results)  Recent Labs  07/18/13 0216 11/09/13 1530  PROBNP 29.7 85.6   CBG:  Recent Labs Lab  11/30/13 1133 11/30/13 1659 11/30/13 2054 12/01/13 0845 12/01/13 1134  GLUCAP 124* 171* 181* 128* 107*    Recent Results (from the past 240 hour(s))  SURGICAL PCR SCREEN     Status: Abnormal   Collection Time    12/01/13  3:41 AM      Result Value Ref Range Status   MRSA, PCR NEGATIVE  NEGATIVE Final   Staphylococcus aureus POSITIVE (*) NEGATIVE Final   Comment:            The Xpert SA Assay (FDA     approved for NASAL specimens     in patients over 70 years of age),     is one component of     a comprehensive surveillance     program.  Test performance has     been validated by [employer] for patients greater     than or equal to 24 year old.     It is not intended     to diagnose infection nor to     guide or monitor treatment.     Studies: Ct Abdomen Pelvis Wo Contrast  11/30/2013   CLINICAL DATA:  Abdominal pain and elevated lipase  EXAM: CT ABDOMEN AND PELVIS WITHOUT CONTRAST  TECHNIQUE: Multidetector CT imaging of the abdomen and pelvis was performed following the standard protocol without IV contrast.  COMPARISON:  04/01/2004.  FINDINGS: BODY WALL: Right larger than left fatty inguinal hernias.  LOWER CHEST: Unremarkable.  ABDOMEN/PELVIS:  Liver: No focal abnormality.  Biliary: Layering cholelithiasis. Gallbladder is distended but does not appear inflamed.  Pancreas: Expansion of the pancreatic head and uncinate process with peripancreatic edema. No loculated collection. No peripancreatic hemorrhage. No ductal dilatation.  Spleen: Unremarkable.  Adrenals: Unremarkable.  Kidneys and ureters: Bilateral nephrolithiasis, the largest stone in the right lower pole, 11 mm. No hydronephrosis or ureteral calculus.  Bladder: Decompressed.  Reproductive: Unremarkable.  Bowel: No obstruction. Normal appendix.  Retroperitoneum: Chronic inguinal lymphadenopathy, also seeing in 2005. The largest node is in the right external iliac chain, measuring 11 mm short axis.  Peritoneum: No free  fluid or gas.  Vascular: No acute abnormality.  OSSEOUS: Lipomatosis changes in the region of the right iliopsoas distal attachment.  IMPRESSION: 1. Acute pancreatitis.  No fluid collection. 2. Cholelithiasis. 3. Bilateral nephrolithiasis.   Electronically Signed   By: Jorje Guild M.D.   On: 11/30/2013 02:47   US Abdomen Complete  11/30/2013   CLINICAL DATA:  Pancreatitis.  Abdominal pain.  EXAM: ULTRASOUND ABDOMEN COMPLETE  COMPARISON:  CT abdomen and pelvis 11/30/2013  FINDINGS: Gallbladder:  Multiple small stones without wall thickening. No sonographic Murphy sign noted.  Common bile duct:  Diameter: 3.9 mm  Liver:  Diffusely increased echogenicity without focal lesion identified.  IVC:  No abnormality visualized.  Pancreas:  Not well visualized. Visualized portion of the head appears hypoechoic.  Spleen:  Size and appearance within normal limits.  Right Kidney:  Length: 11.5 cm.  1.5 cm lower pole stone.  No hydronephrosis.  Left Kidney:  Length: 12.5 cm. 1.5 cm linear echogenic focus in the lower pole may reflect a stone but there was no clear posterior acoustic shadowing. No hydronephrosis.  Abdominal aorta:  No aneurysm visualized.  Other findings:  None.  IMPRESSION: 1. Pancreas not well visualized, although hypoechoic appearance of visualized portion is compatible with acute pancreatitis. 2. No biliary dilatation. 3. Cholelithiasis.  No evidence of acute cholecystitis. 4. Increased echogenicity of the liver, suggestive of steatosis. 5. Nephrolithiasis without hydronephrosis.   Electronically Signed   By: Logan Bores   On: 11/30/2013 08:01    Scheduled Meds: . enoxaparin (LOVENOX) injection  40 mg Subcutaneous Q24H  . folic acid  1 mg Oral Daily  . gabapentin  300 mg Oral TID  . insulin aspart  0-9 Units Subcutaneous TID WC  . multivitamin with minerals  1 tablet Oral Daily  . mupirocin ointment  1 application Nasal BID  . pantoprazole  40 mg Oral Q0600  . sodium chloride  3 mL Intravenous  Q12H  . thiamine  100 mg Oral Daily   Or  . thiamine  100 mg Intravenous Daily   Continuous Infusions: . sodium chloride 0.9 % 1,000 mL infusion 150 mL/hr at 11/30/13 0800    Principal Problem:   Acute pancreatitis Active Problems:   Hypertension   Diabetes   Acute renal failure   Kidney stones   Cholelithiasis: PER CT ABD/PELVIS AND ABD [place] 11/30/13    Time spent: 35 minutes    Eugenie Filler M.D.  [facility] Pager [phone]. If 7PM-7AM, please contact night-coverage at [email or web address], password [facility] 12/01/2013, 12:23 PM  LOS: 2 days

## 2013-12-01 NOTE — Progress Notes (Signed)
Subjective: Denies abdominal pain  Objective: Vital signs in last 24 hours: Temp:  [97.9 F (36.6 C)-98.6 F (37 C)] 98 F (36.7 C) (05/07 0846) Pulse Rate:  [85-91] 86 (05/07 0846) Resp:  [18-19] 19 (05/07 0846) BP: (114-160)/(79-97) 128/87 mmHg (05/07 0846) SpO2:  [98 %-99 %] 98 % (05/07 0846) Weight:  [261 lb 12.7 oz (118.75 kg)] 261 lb 12.7 oz (118.75 kg) (05/06 2056) Last BM Date: 11/29/13  Intake/Output from previous day: 05/06 0701 - 05/07 0700 In: 3850 [P.O.:1600; I.V.:2250] Out: 700 [Urine:700] Intake/Output this shift:    Abdomen soft, non tender Lab Results:   Recent Labs  11/30/13 0644 12/01/13 0545  WBC 7.3 6.3  HGB 13.1 12.6*  HCT 36.8* 35.8*  PLT 217 214   BMET  Recent Labs  11/30/13 0644 12/01/13 0545  NA 134* 140  K 4.2 4.5  CL 96 104  CO2 23 22  GLUCOSE 135* 129*  BUN 22 15  CREATININE 1.30 0.86  CALCIUM 8.4 8.5   PT/INR  Recent Labs  12/01/13 0545  LABPROT 12.3  INR 0.93   ABG No results found for this basename: PHART, PCO2, PO2, HCO3,  in the last 72 hours  Studies/Results: Ct Abdomen Pelvis Wo Contrast  11/30/2013   CLINICAL DATA:  Abdominal pain and elevated lipase  EXAM: CT ABDOMEN AND PELVIS WITHOUT CONTRAST  TECHNIQUE: Multidetector CT imaging of the abdomen and pelvis was performed following the standard protocol without IV contrast.  COMPARISON:  04/01/2004.  FINDINGS: BODY WALL: Right larger than left fatty inguinal hernias.  LOWER CHEST: Unremarkable.  ABDOMEN/PELVIS:  Liver: No focal abnormality.  Biliary: Layering cholelithiasis. Gallbladder is distended but does not appear inflamed.  Pancreas: Expansion of the pancreatic head and uncinate process with peripancreatic edema. No loculated collection. No peripancreatic hemorrhage. No ductal dilatation.  Spleen: Unremarkable.  Adrenals: Unremarkable.  Kidneys and ureters: Bilateral nephrolithiasis, the largest stone in the right lower pole, 11 mm. No hydronephrosis or  ureteral calculus.  Bladder: Decompressed.  Reproductive: Unremarkable.  Bowel: No obstruction. Normal appendix.  Retroperitoneum: Chronic inguinal lymphadenopathy, also seeing in 2005. The largest node is in the right external iliac chain, measuring 11 mm short axis.  Peritoneum: No free fluid or gas.  Vascular: No acute abnormality.  OSSEOUS: Lipomatosis changes in the region of the right iliopsoas distal attachment.  IMPRESSION: 1. Acute pancreatitis.  No fluid collection. 2. Cholelithiasis. 3. Bilateral nephrolithiasis.   Electronically Signed   By: Jorje Guild M.D.   On: 11/30/2013 02:47   US Abdomen Complete  11/30/2013   CLINICAL DATA:  Pancreatitis.  Abdominal pain.  EXAM: ULTRASOUND ABDOMEN COMPLETE  COMPARISON:  CT abdomen and pelvis 11/30/2013  FINDINGS: Gallbladder:  Multiple small stones without wall thickening. No sonographic Murphy sign noted.  Common bile duct:  Diameter: 3.9 mm  Liver:  Diffusely increased echogenicity without focal lesion identified.  IVC:  No abnormality visualized.  Pancreas:  Not well visualized. Visualized portion of the head appears hypoechoic.  Spleen:  Size and appearance within normal limits.  Right Kidney:  Length: 11.5 cm.  1.5 cm lower pole stone.  No hydronephrosis.  Left Kidney:  Length: 12.5 cm. 1.5 cm linear echogenic focus in the lower pole may reflect a stone but there was no clear posterior acoustic shadowing. No hydronephrosis.  Abdominal aorta:  No aneurysm visualized.  Other findings:  None.  IMPRESSION: 1. Pancreas not well visualized, although hypoechoic appearance of visualized portion is compatible with acute pancreatitis. 2. No biliary  dilatation. 3. Cholelithiasis.  No evidence of acute cholecystitis. 4. Increased echogenicity of the liver, suggestive of steatosis. 5. Nephrolithiasis without hydronephrosis.   Electronically Signed   By: Logan Bores   On: 11/30/2013 08:01    Anti-infectives: Anti-infectives   None      Assessment/Plan: s/p  * No surgery found *  Pancreatitis with history of ETOH abuse and gallstones  Lipase improved and he is doing well clinically.  May be able to proceed with lap chole and c-gram today.  I discussed the risks with him in detail including but not limited to bleeding, infection, bile duct injury, bile leak, injury to other structures, the need to convert to an open procedure,  Cardiopulmonary problems, etc.  He agrees to proceed.  LOS: 2 days    Harl Bowie 12/01/2013

## 2013-12-02 ENCOUNTER — Encounter (HOSPITAL_COMMUNITY)
Admission: EM | Disposition: A | Discharge status: Home / Self Care | Payer: Self-pay | Source: Home / Self Care | Attending: Internal Medicine

## 2013-12-02 ENCOUNTER — Encounter (HOSPITAL_COMMUNITY): Payer: Self-pay | Admitting: Anesthesiology

## 2013-12-02 LAB — GLUCOSE, CAPILLARY
GLUCOSE-CAPILLARY: 124 mg/dL — AB (ref 70–99)
GLUCOSE-CAPILLARY: 123 mg/dL — AB (ref 70–99)
GLUCOSE-CAPILLARY: 175 mg/dL — AB (ref 70–99)
Glucose-Capillary: 115 mg/dL — ABNORMAL HIGH (ref 70–99)

## 2013-12-02 LAB — CBC
HCT: 37.3 % — ABNORMAL LOW (ref 39.0–52.0)
Hemoglobin: 13.2 g/dL (ref 13.0–17.0)
MCH: 30.8 pg (ref 26.0–34.0)
MCHC: 35.4 g/dL (ref 30.0–36.0)
MCV: 87.1 fL (ref 78.0–100.0)
PLATELETS: 224 10*3/uL (ref 150–400)
RBC: 4.28 MIL/uL (ref 4.22–5.81)
RDW: 13.1 % (ref 11.5–15.5)
WBC: 5.9 10*3/uL (ref 4.0–10.5)

## 2013-12-02 LAB — COMPREHENSIVE METABOLIC PANEL
ALT: 32 U/L (ref 0–53)
AST: 19 U/L (ref 0–37)
Albumin: 3.3 g/dL — ABNORMAL LOW (ref 3.5–5.2)
Alkaline Phosphatase: 77 U/L (ref 39–117)
BUN: 8 mg/dL (ref 6–23)
CO2: 23 meq/L (ref 19–32)
CREATININE: 0.7 mg/dL (ref 0.50–1.35)
Calcium: 8.8 mg/dL (ref 8.4–10.5)
Chloride: 108 mEq/L (ref 96–112)
GLUCOSE: 108 mg/dL — AB (ref 70–99)
Potassium: 4.4 mEq/L (ref 3.7–5.3)
Sodium: 144 mEq/L (ref 137–147)
Total Bilirubin: 0.3 mg/dL (ref 0.3–1.2)
Total Protein: 7.7 g/dL (ref 6.0–8.3)

## 2013-12-02 LAB — LIPASE, BLOOD: LIPASE: 127 U/L — AB (ref 11–59)

## 2013-12-02 SURGERY — LAPAROSCOPIC CHOLECYSTECTOMY WITH INTRAOPERATIVE CHOLANGIOGRAM
Anesthesia: General

## 2013-12-02 MED ORDER — MEPERIDINE HCL 25 MG/ML IJ SOLN: 6.2500 mg | INTRAMUSCULAR | Status: DC | PRN

## 2013-12-02 MED ORDER — ENALAPRILAT 1.25 MG/ML IV SOLN
1.2500 mg | Freq: Four times a day (QID) | INTRAVENOUS | Status: DC
  Administered 2013-12-02 – 2013-12-04 (×5): 1.25 mg via INTRAVENOUS
  Filled 2013-12-02 (×12): qty 1

## 2013-12-02 MED ORDER — OXYCODONE HCL 5 MG/5ML PO SOLN: 5.0000 mg | Freq: Once | ORAL | Status: DC | PRN

## 2013-12-02 MED ORDER — HYDROMORPHONE HCL PF 1 MG/ML IJ SOLN: 0.2500 mg | INTRAMUSCULAR | Status: DC | PRN

## 2013-12-02 MED ORDER — HYDRALAZINE HCL 20 MG/ML IJ SOLN
10.0000 mg | Freq: Four times a day (QID) | INTRAMUSCULAR | Status: DC | PRN
  Administered 2013-12-02: 10 mg via INTRAVENOUS
  Filled 2013-12-02: qty 1

## 2013-12-02 MED ORDER — MIDAZOLAM HCL 2 MG/2ML IJ SOLN: 0.5000 mg | Freq: Once | INTRAMUSCULAR | Status: DC | PRN

## 2013-12-02 MED ORDER — OXYCODONE HCL 5 MG PO TABS: 5.0000 mg | ORAL_TABLET | Freq: Once | ORAL | Status: DC | PRN

## 2013-12-02 MED ORDER — PROMETHAZINE HCL 25 MG/ML IJ SOLN: 6.2500 mg | INTRAMUSCULAR | Status: DC | PRN

## 2013-12-02 SURGICAL SUPPLY — 45 items
ADH SKN CLS APL DERMABOND .7 (GAUZE/BANDAGES/DRESSINGS) ×1
APPLIER CLIP 5 13 M/L LIGAMAX5 (MISCELLANEOUS)
APPLIER CLIP ROT 10 11.4 M/L (STAPLE)
APR CLP MED LRG 11.4X10 (STAPLE)
APR CLP MED LRG 5 ANG JAW (MISCELLANEOUS)
BAG SPEC RTRVL LRG 6X4 10 (ENDOMECHANICALS) ×1
BLADE SURG ROTATE 9660 (MISCELLANEOUS) IMPLANT
CANISTER SUCTION 2500CC (MISCELLANEOUS) ×3 IMPLANT
CHLORAPREP W/TINT 26ML (MISCELLANEOUS) ×3 IMPLANT
CHOLANGIOGRAM CATH TAUT (CATHETERS) ×3 IMPLANT
CLIP APPLIE 5 13 M/L LIGAMAX5 (MISCELLANEOUS) IMPLANT
CLIP APPLIE ROT 10 11.4 M/L (STAPLE) IMPLANT
COVER MAYO STAND STRL (DRAPES) ×3 IMPLANT
COVER SURGICAL LIGHT HANDLE (MISCELLANEOUS) ×3 IMPLANT
DERMABOND ADVANCED (GAUZE/BANDAGES/DRESSINGS) ×1
DERMABOND ADVANCED .7 DNX12 (GAUZE/BANDAGES/DRESSINGS) ×2 IMPLANT
DRAPE C-ARM 42X72 X-RAY (DRAPES) ×3 IMPLANT
DRAPE UTILITY 15X26 W/TAPE STR (DRAPE) ×6 IMPLANT
ELECT REM PT RETURN 9FT ADLT (ELECTROSURGICAL) ×2
ELECTRODE REM PT RTRN 9FT ADLT (ELECTROSURGICAL) ×2 IMPLANT
FILTER SMOKE EVAC LAPAROSHD (FILTER) ×3 IMPLANT
GLOVE SURG SIGNA 7.5 PF LTX (GLOVE) ×3 IMPLANT
GOWN STRL REUS W/ TWL LRG LVL3 (GOWN DISPOSABLE) ×6 IMPLANT
GOWN STRL REUS W/ TWL XL LVL3 (GOWN DISPOSABLE) ×2 IMPLANT
GOWN STRL REUS W/TWL LRG LVL3 (GOWN DISPOSABLE) ×6
GOWN STRL REUS W/TWL XL LVL3 (GOWN DISPOSABLE) ×2
IV CATH 14GX2 1/4 (CATHETERS) ×3 IMPLANT
KIT BASIN OR (CUSTOM PROCEDURE TRAY) ×3 IMPLANT
KIT ROOM TURNOVER OR (KITS) ×3 IMPLANT
NS IRRIG 1000ML POUR BTL (IV SOLUTION) ×3 IMPLANT
PAD ARMBOARD 7.5X6 YLW CONV (MISCELLANEOUS) ×3 IMPLANT
POUCH SPECIMEN RETRIEVAL 10MM (ENDOMECHANICALS) ×3 IMPLANT
SCISSORS LAP 5X35 DISP (ENDOMECHANICALS) ×3 IMPLANT
SET IRRIG TUBING LAPAROSCOPIC (IRRIGATION / IRRIGATOR) ×3 IMPLANT
SLEEVE ENDOPATH XCEL 5M (ENDOMECHANICALS) ×3 IMPLANT
SPECIMEN JAR SMALL (MISCELLANEOUS) ×3 IMPLANT
STOPCOCK 4 WAY LG BORE MALE ST (IV SETS) ×3 IMPLANT
SUT VIC AB 5-0 PS2 18 (SUTURE) ×3 IMPLANT
TOWEL OR 17X24 6PK STRL BLUE (TOWEL DISPOSABLE) ×3 IMPLANT
TOWEL OR 17X26 10 PK STRL BLUE (TOWEL DISPOSABLE) ×3 IMPLANT
TRAY LAPAROSCOPIC (CUSTOM PROCEDURE TRAY) ×3 IMPLANT
TROCAR XCEL BLUNT TIP 100MML (ENDOMECHANICALS) ×3 IMPLANT
TROCAR XCEL NON-BLD 11X100MML (ENDOMECHANICALS) IMPLANT
TROCAR XCEL NON-BLD 5MMX100MML (ENDOMECHANICALS) ×3 IMPLANT
TUBING EXTENTION W/L.L. (IV SETS) ×3 IMPLANT

## 2013-12-02 NOTE — Progress Notes (Signed)
The patient's surgery will be canceled today because I ran out of operating time.  I have spoken to the on call surgeon about the case.  Will send the patient back to the floor and will plan for the surgery in the AM.  His surgery will be up to the surgeons making weekend rounds about the timing and availability.  Will give clear liquids until midnight, then NPO.  Alphonsa Overall, MD, [facility] Surgery Pager: [phone] Office phone:  [phone]

## 2013-12-02 NOTE — Progress Notes (Signed)
  Subjective: Pt resting comfortably.  Lipase down to 127.  Afebrile.    Objective: Vital signs in last 24 hours: Temp:  [97.7 F (36.5 C)-98.3 F (36.8 C)] 98.3 F (36.8 C) (05/08 1219) Pulse Rate:  [81-88] 82 (05/08 1219) Resp:  [18-20] 18 (05/08 1219) BP: (127-171)/(84-115) 171/115 mmHg (05/08 1219) SpO2:  [95 %-100 %] 96 % (05/08 1219) Weight:  [263 lb 12.8 oz (119.659 kg)] 263 lb 12.8 oz (119.659 kg) (05/07 2046) Last BM Date: 11/30/13  Intake/Output from previous day: 05/07 0701 - 05/08 0700 In: 4770 [P.O.:120; I.V.:4650] Out: -  Intake/Output this shift:   PE General appearance: alert, cooperative and no distress Resp: clear to auscultation bilaterally Cardio: regular rate and rhythm, S1, S2 normal, no murmur, click, rub or gallop GI: soft, non-tender; bowel sounds normal; no masses,  no organomegaly Extremities: extremities normal, atraumatic, no cyanosis or edema  Lab Results:   Recent Labs  12/01/13 0545 12/02/13 0538  WBC 6.3 5.9  HGB 12.6* 13.2  HCT 35.8* 37.3*  PLT 214 224   BMET  Recent Labs  12/01/13 0545 12/02/13 0538  NA 140 144  K 4.5 4.4  CL 104 108  CO2 22 23  GLUCOSE 129* 108*  BUN 15 8  CREATININE 0.86 0.70  CALCIUM 8.5 8.8   PT/INR  Recent Labs  12/01/13 0545  LABPROT 12.3  INR 0.93   ABG No results found for this basename: PHART, PCO2, PO2, HCO3,  in the last 72 hours  Studies/Results: No results found.  Anti-infectives: Anti-infectives   None      Assessment/Plan: 1) Pancreatitis, ? Gallstone pancreatitis  2) Cholelithiasis  3) AKI  4) HTN  5) DM-2  6) Nephrolithiasis. bilateral  7) Asthma  8) Alcohol abuse  -laparoscopic cholecystectomy today  -consent is signed, NPO, off chemical anticoagulation.     LOS: 3 days    Erby Pian ANP-BC Pager [phone] 12/02/2013 12:31 PM  Agree with above. I discussed with the patient the indications and risks of gall bladder surgery.  The primary risks of gall  bladder surgery include, but are not limited to, bleeding, infection, common bile duct injury, and open surgery.  We discussed the typical post-operative recovery course. I tried to answer the patient's questions. Because of the schedule, there is a chance I may not get to him today.  Alphonsa Overall, MD, [facility] Surgery Pager: [phone] Office phone:  [phone]

## 2013-12-02 NOTE — Anesthesia Preprocedure Evaluation (Deleted)
Anesthesia Evaluation  Patient identified by MRN, date of birth, ID band Patient awake    Reviewed: Allergy & Precautions, H&P , NPO status , Patient's Chart, lab work & pertinent test results  History of Anesthesia Complications Negative for: history of anesthetic complications  Airway Mallampati: II TM Distance: >3 FB Neck ROM: Full    Dental  (+) Edentulous Upper, Edentulous Lower   Pulmonary asthma , COPD COPD inhaler,  breath sounds clear to auscultation  Pulmonary exam normal       Cardiovascular hypertension, Pt. on medications - anginaRhythm:Regular Rate:Normal     Neuro/Psych negative neurological ROS     GI/Hepatic negative GI ROS, Neg liver ROS,   Endo/Other  diabetes (glu 124), Oral Hypoglycemic AgentsMorbid obesity  Renal/GU negative Renal ROS     Musculoskeletal   Abdominal (+) + obese,   Peds  Hematology   Anesthesia Other Findings   Reproductive/Obstetrics                          Anesthesia Physical Anesthesia Plan  ASA: III  Anesthesia Plan: General   Post-op Pain Management:    Induction: Intravenous  Airway Management Planned: Oral ETT  Additional Equipment:   Intra-op Plan:   Post-operative Plan: Extubation in OR  Informed Consent: I have reviewed the patients History and Physical, chart, labs and discussed the procedure including the risks, benefits and alternatives for the proposed anesthesia with the patient or authorized representative who has indicated his/her understanding and acceptance.   Dental advisory given  Plan Discussed with: CRNA and Surgeon  Anesthesia Plan Comments: (Plan routine monitors, GETA)        Anesthesia Quick Evaluation

## 2013-12-02 NOTE — Progress Notes (Signed)
[facility] PROGRESS NOTE  Troy [name] ION:[id number] DOB: 1966/05/02 DOA: 11/29/2013 PCP: [name], MD  Assessment/Plan: #1 acute pancreatitis Likely secondary to gallstone pancreatitis plus or minus alcohol use however patient states does not drink that much alcohol drinks about one to 2 beers a day. CT of the abdomen and pelvis as well as abdominal ultrasound consistent with cholelithiasis without evidence of acute cholecystitis or biliary dilatation. Clinical improvement. Lipase levels improving and lipase now at 127 from 206 from 454 from 722 on admission. Fasting lipid panel with a triglyceride level of 153. Patient currently tolerating full liquids. Continue IV fluids. Patient has been seen by general surgery and patient for laparoscopic cholecystectomy this afternoon. Appreciate general surgery input and recommendations.  #2 acute renal failure Likely secondary to prerenal azotemia. Improved with hydration. Abdominal ultrasound with nephrolithiasis without hydronephrosis. Will follow.  #3 cholelithiasis General surgical consultation has been obtained and recommended laparoscopic cholecystectomy as patient had presented with acute pancreatitis which may have been secondary to gallstone pancreatitis. Patient for lap cholecystectomy today per [facility].  #4 nephrolithiasis Currently asymptomatic. Outpatient followup.  #5 hypertension Stable. Antihypertensive medications on hold. Follow for now.  #6 history of asthma COPD Stable. Continue inhalers.  #7 diabetes mellitus Hemoglobin A1c was 7.0 11/09/2013. CBGs have ranged from 115-191. Continue sliding scale insulin. Follow.  #8 history of alcohol use Currently stable. No signs of withdrawal. Continue thiamine, folic acid, Ativan CIWA protocol  #9 prophylaxis PPI for GI prophylaxis. Lovenox for DVT prophylaxis.  Code Status: Full Family Communication: Updated patient no family at bedside. Disposition Plan: Home in  medically stable.   Consultants:  General Surgery : Dr Lucia Gaskins 11/30/13  Procedures:  CT abdomen and pelvis 11/30/2013  Abdominal ultrasound 11/30/2013  Antibiotics:  None  HPI/Subjective: Patient states no further abdominal pain. Patient with no emesis. Patient tolerating full liquids.  Objective: Filed Vitals:   12/02/13 0755  BP: 127/87  Pulse: 81  Temp: 98.2 F (36.8 C)  Resp: 19    Intake/Output Summary (Last 24 hours) at 12/02/13 1046 Last data filed at 12/02/13 0600  Gross per 24 hour  Intake   4770 ml  Output      0 ml  Net   4770 ml   Filed Weights   11/29/13 2234 11/30/13 2056 12/01/13 2046  Weight: 118.6 kg (261 lb 7.5 oz) 118.75 kg (261 lb 12.7 oz) 119.659 kg (263 lb 12.8 oz)    Exam:   General:  NAD  Cardiovascular: RRR  Respiratory: CTAB  Abdomen: Soft, nontender, nondistended, positive bowel sounds.  Musculoskeletal: No clubbing cyanosis or edema.  Data Reviewed: Basic Metabolic Panel:  Recent Labs Lab 11/29/13 1732 11/30/13 0644 12/01/13 0545 12/02/13 0538  NA 130* 134* 140 144  K 4.7 4.2 4.5 4.4  CL 92* 96 104 108  CO2 22 23 22 23   GLUCOSE 217* 135* 129* 108*  BUN 21 22 15 8   CREATININE 1.65* 1.30 0.86 0.70  CALCIUM 9.0 8.4 8.5 8.8   Liver Function Tests:  Recent Labs Lab 11/29/13 1732 11/30/13 0644 12/01/13 0545 12/02/13 0538  AST 20 15 18 19   ALT 43 34 33 32  ALKPHOS 85 76 76 77  BILITOT 0.4 0.4 0.4 0.3  PROT 7.9 7.1 7.2 7.7  ALBUMIN 3.6 3.3* 3.3* 3.3*    Recent Labs Lab 11/29/13 1732 11/30/13 0644 12/01/13 0545 12/02/13 0538  LIPASE 722* 454* 206* 127*   No results found for this basename: AMMONIA,  in the last 168 hours  CBC:  Recent Labs Lab 11/29/13 1732 11/30/13 0644 12/01/13 0545 12/02/13 0538  WBC 8.3 7.3 6.3 5.9  NEUTROABS 3.7 2.7  --   --   HGB 13.9 13.1 12.6* 13.2  HCT 39.6 36.8* 35.8* 37.3*  MCV 86.1 85.8 86.5 87.1  PLT 264 217 214 224   Cardiac Enzymes: No results found for  this basename: CKTOTAL, CKMB, CKMBINDEX, TROPONINI,  in the last 168 hours BNP (last 3 results)  Recent Labs  07/18/13 0216 11/09/13 1530  PROBNP 29.7 85.6   CBG:  Recent Labs Lab 12/01/13 0845 12/01/13 1134 12/01/13 1633 12/01/13 2043 12/02/13 0754  GLUCAP 128* 107* 184* 191* 115*    Recent Results (from the past 240 hour(s))  SURGICAL PCR SCREEN     Status: Abnormal   Collection Time    12/01/13  3:41 AM      Result Value Ref Range Status   MRSA, PCR NEGATIVE  NEGATIVE Final   Staphylococcus aureus POSITIVE (*) NEGATIVE Final   Comment:            The Xpert SA Assay (FDA     approved for NASAL specimens     in patients over 80 years of age),     is one component of     a comprehensive surveillance     program.  Test performance has     been validated by [employer] for patients greater     than or equal to 70 year old.     It is not intended     to diagnose infection nor to     guide or monitor treatment.     Studies: No results found.  Scheduled Meds: . enoxaparin (LOVENOX) injection  40 mg Subcutaneous Q24H  . folic acid  1 mg Oral Daily  . gabapentin  300 mg Oral TID  . insulin aspart  0-9 Units Subcutaneous TID WC  . multivitamin with minerals  1 tablet Oral Daily  . mupirocin ointment  1 application Nasal BID  . pantoprazole  40 mg Oral Q0600  . sodium chloride  3 mL Intravenous Q12H  . thiamine  100 mg Oral Daily   Or  . thiamine  100 mg Intravenous Daily   Continuous Infusions: . sodium chloride 0.9 % 1,000 mL infusion 100 mL/hr at 12/02/13 3762    Principal Problem:   Acute pancreatitis Active Problems:   Hypertension   Diabetes   Acute renal failure   Kidney stones   Cholelithiasis: PER CT ABD/PELVIS AND ABD [place] 11/30/13    Time spent: 35 minutes    Eugenie Filler M.D.  [facility] Pager [phone]. If 7PM-7AM, please contact night-coverage at [email or web address], password [facility] 12/02/2013, 10:46 AM  LOS: 3 days

## 2013-12-03 ENCOUNTER — Encounter (HOSPITAL_COMMUNITY)
Admission: EM | Disposition: A | Discharge status: Home / Self Care | Payer: Self-pay | Source: Home / Self Care | Attending: Internal Medicine

## 2013-12-03 ENCOUNTER — Encounter (HOSPITAL_COMMUNITY): Payer: Self-pay | Admitting: Anesthesiology

## 2013-12-03 ENCOUNTER — Inpatient Hospital Stay (HOSPITAL_COMMUNITY): Payer: BC Managed Care – PPO | Admitting: Anesthesiology

## 2013-12-03 ENCOUNTER — Encounter (HOSPITAL_COMMUNITY): Payer: BC Managed Care – PPO | Admitting: Anesthesiology

## 2013-12-03 DIAGNOSIS — K8010 Calculus of gallbladder with chronic cholecystitis without obstruction: Secondary | ICD-10-CM

## 2013-12-03 HISTORY — PX: CHOLECYSTECTOMY: SHX55

## 2013-12-03 LAB — COMPREHENSIVE METABOLIC PANEL
ALK PHOS: 79 U/L (ref 39–117)
ALT: 29 U/L (ref 0–53)
AST: 22 U/L (ref 0–37)
Albumin: 3.6 g/dL (ref 3.5–5.2)
BUN: 7 mg/dL (ref 6–23)
CO2: 22 meq/L (ref 19–32)
Calcium: 9 mg/dL (ref 8.4–10.5)
Chloride: 106 mEq/L (ref 96–112)
Creatinine, Ser: 0.81 mg/dL (ref 0.50–1.35)
GFR calc Af Amer: >90 mL/min (ref >90)
GLUCOSE: 125 mg/dL — AB (ref 70–99)
POTASSIUM: 4.2 meq/L (ref 3.7–5.3)
SODIUM: 142 meq/L (ref 137–147)
TOTAL PROTEIN: 8.1 g/dL (ref 6.0–8.3)
Total Bilirubin: 0.4 mg/dL (ref 0.3–1.2)

## 2013-12-03 LAB — CBC
HEMATOCRIT: 39.2 % (ref 39.0–52.0)
Hemoglobin: 13.5 g/dL (ref 13.0–17.0)
MCH: 30.2 pg (ref 26.0–34.0)
MCHC: 34.4 g/dL (ref 30.0–36.0)
MCV: 87.7 fL (ref 78.0–100.0)
Platelets: 242 10*3/uL (ref 150–400)
RBC: 4.47 MIL/uL (ref 4.22–5.81)
RDW: 13.2 % (ref 11.5–15.5)
WBC: 6.4 10*3/uL (ref 4.0–10.5)

## 2013-12-03 LAB — GLUCOSE, CAPILLARY
GLUCOSE-CAPILLARY: 132 mg/dL — AB (ref 70–99)
GLUCOSE-CAPILLARY: 158 mg/dL — AB (ref 70–99)
GLUCOSE-CAPILLARY: 147 mg/dL — AB (ref 70–99)
GLUCOSE-CAPILLARY: 144 mg/dL — AB (ref 70–99)
Glucose-Capillary: 144 mg/dL — ABNORMAL HIGH (ref 70–99)

## 2013-12-03 LAB — LIPASE, BLOOD: Lipase: 109 U/L — ABNORMAL HIGH (ref 11–59)

## 2013-12-03 SURGERY — LAPAROSCOPIC CHOLECYSTECTOMY WITH INTRAOPERATIVE CHOLANGIOGRAM
Anesthesia: General | Site: Abdomen

## 2013-12-03 MED ORDER — MIDAZOLAM HCL 2 MG/2ML IJ SOLN
INTRAMUSCULAR | Status: AC
Start: 2013-12-03 — End: 2013-12-03
  Filled 2013-12-03: qty 2

## 2013-12-03 MED ORDER — ROCURONIUM BROMIDE 50 MG/5ML IV SOLN
INTRAVENOUS | Status: AC
  Filled 2013-12-03: qty 1

## 2013-12-03 MED ORDER — DEXTROSE 5 % IV SOLN
INTRAVENOUS | Status: AC
  Administered 2013-12-03: 13:00:00
  Filled 2013-12-03 (×2): qty 1

## 2013-12-03 MED ORDER — OXYCODONE-ACETAMINOPHEN 5-325 MG PO TABS: 1.0000 | ORAL_TABLET | ORAL | Status: DC | PRN

## 2013-12-03 MED ORDER — MIDAZOLAM HCL 5 MG/5ML IJ SOLN
INTRAMUSCULAR | Status: DC | PRN
  Administered 2013-12-03: 2 mg via INTRAVENOUS

## 2013-12-03 MED ORDER — PHENYLEPHRINE HCL 10 MG/ML IJ SOLN
INTRAMUSCULAR | Status: DC | PRN
  Administered 2013-12-03: 120 ug via INTRAVENOUS
  Administered 2013-12-03 (×2): 80 ug via INTRAVENOUS

## 2013-12-03 MED ORDER — BUPIVACAINE-EPINEPHRINE 0.25% -1:200000 IJ SOLN
INTRAMUSCULAR | Status: DC | PRN
  Administered 2013-12-03: 20 mL

## 2013-12-03 MED ORDER — PROPOFOL 10 MG/ML IV BOLUS
INTRAVENOUS | Status: AC
  Filled 2013-12-03: qty 20

## 2013-12-03 MED ORDER — ROCURONIUM BROMIDE 100 MG/10ML IV SOLN
INTRAVENOUS | Status: DC | PRN
  Administered 2013-12-03: 50 mg via INTRAVENOUS
  Administered 2013-12-03: 10 mg via INTRAVENOUS

## 2013-12-03 MED ORDER — PHENYLEPHRINE HCL 10 MG/ML IJ SOLN
INTRAMUSCULAR | Status: AC
  Filled 2013-12-03: qty 1

## 2013-12-03 MED ORDER — HYDROMORPHONE HCL PF 1 MG/ML IJ SOLN
INTRAMUSCULAR | Status: AC
  Administered 2013-12-03: 0.5 mg via INTRAVENOUS
  Filled 2013-12-03: qty 2

## 2013-12-03 MED ORDER — HEMOSTATIC AGENTS (NO CHARGE) OPTIME
TOPICAL | Status: DC | PRN
  Administered 2013-12-03: 1 via TOPICAL

## 2013-12-03 MED ORDER — ARTIFICIAL TEARS OP OINT
TOPICAL_OINTMENT | OPHTHALMIC | Status: DC | PRN
  Administered 2013-12-03: 1 via OPHTHALMIC

## 2013-12-03 MED ORDER — PHENYLEPHRINE 40 MCG/ML (10ML) SYRINGE FOR IV PUSH (FOR BLOOD PRESSURE SUPPORT)
PREFILLED_SYRINGE | INTRAVENOUS | Status: AC
  Filled 2013-12-03: qty 10

## 2013-12-03 MED ORDER — GLYCOPYRROLATE 0.2 MG/ML IJ SOLN
INTRAMUSCULAR | Status: AC
  Filled 2013-12-03: qty 4

## 2013-12-03 MED ORDER — 0.9 % SODIUM CHLORIDE (POUR BTL) OPTIME
TOPICAL | Status: DC | PRN
  Administered 2013-12-03: 1000 mL

## 2013-12-03 MED ORDER — PHENYLEPHRINE HCL 10 MG/ML IJ SOLN
10.0000 mg | INTRAVENOUS | Status: DC | PRN
  Administered 2013-12-03: 50 ug/min via INTRAVENOUS

## 2013-12-03 MED ORDER — FENTANYL CITRATE 0.05 MG/ML IJ SOLN
INTRAMUSCULAR | Status: DC | PRN
  Administered 2013-12-03: 50 ug via INTRAVENOUS
  Administered 2013-12-03: 100 ug via INTRAVENOUS
  Administered 2013-12-03 (×2): 50 ug via INTRAVENOUS

## 2013-12-03 MED ORDER — NEOSTIGMINE METHYLSULFATE 10 MG/10ML IV SOLN
INTRAVENOUS | Status: DC | PRN
  Administered 2013-12-03: 5 mg via INTRAVENOUS

## 2013-12-03 MED ORDER — NEOSTIGMINE METHYLSULFATE 10 MG/10ML IV SOLN
INTRAVENOUS | Status: AC
  Filled 2013-12-03: qty 1

## 2013-12-03 MED ORDER — CEFOTETAN DISODIUM-DEXTROSE 2-2.08 GM-% IV SOLR
INTRAVENOUS | Status: AC
  Filled 2013-12-03: qty 50

## 2013-12-03 MED ORDER — DEXTROSE 5 % IV SOLN
2.0000 g | Freq: Once | INTRAVENOUS | Status: AC
  Administered 2013-12-03: 2 g via INTRAVENOUS
  Filled 2013-12-03: qty 2

## 2013-12-03 MED ORDER — ARTIFICIAL TEARS OP OINT
TOPICAL_OINTMENT | OPHTHALMIC | Status: AC
Start: 2013-12-03 — End: 2013-12-03
  Filled 2013-12-03: qty 3.5

## 2013-12-03 MED ORDER — LIDOCAINE HCL (CARDIAC) 20 MG/ML IV SOLN
INTRAVENOUS | Status: AC
  Filled 2013-12-03: qty 5

## 2013-12-03 MED ORDER — EPHEDRINE SULFATE 50 MG/ML IJ SOLN
INTRAMUSCULAR | Status: AC
  Filled 2013-12-03: qty 1

## 2013-12-03 MED ORDER — ONDANSETRON HCL 4 MG/2ML IJ SOLN: 4.0000 mg | Freq: Four times a day (QID) | INTRAMUSCULAR | Status: DC | PRN

## 2013-12-03 MED ORDER — FENTANYL CITRATE 0.05 MG/ML IJ SOLN
INTRAMUSCULAR | Status: AC
  Filled 2013-12-03: qty 5

## 2013-12-03 MED ORDER — LACTATED RINGERS IV SOLN
INTRAVENOUS | Status: DC | PRN
  Administered 2013-12-03 (×2): via INTRAVENOUS

## 2013-12-03 MED ORDER — SODIUM CHLORIDE 0.9 % IR SOLN
Status: DC | PRN
  Administered 2013-12-03: 1000 mL

## 2013-12-03 MED ORDER — LIDOCAINE HCL (CARDIAC) 20 MG/ML IV SOLN
INTRAVENOUS | Status: DC | PRN
  Administered 2013-12-03: 70 mg via INTRAVENOUS

## 2013-12-03 MED ORDER — OXYCODONE HCL 5 MG PO TABS: 5.0000 mg | ORAL_TABLET | Freq: Once | ORAL | Status: DC | PRN

## 2013-12-03 MED ORDER — SUCCINYLCHOLINE CHLORIDE 20 MG/ML IJ SOLN
INTRAMUSCULAR | Status: AC
  Filled 2013-12-03: qty 1

## 2013-12-03 MED ORDER — SODIUM CHLORIDE 0.9 % IV SOLN: INTRAVENOUS | Status: DC | PRN

## 2013-12-03 MED ORDER — SUCCINYLCHOLINE CHLORIDE 20 MG/ML IJ SOLN
INTRAMUSCULAR | Status: DC | PRN
  Administered 2013-12-03: 140 mg via INTRAVENOUS

## 2013-12-03 MED ORDER — GLYCOPYRROLATE 0.2 MG/ML IJ SOLN
INTRAMUSCULAR | Status: DC | PRN
  Administered 2013-12-03: .8 mg via INTRAVENOUS

## 2013-12-03 MED ORDER — ONDANSETRON HCL 4 MG/2ML IJ SOLN
INTRAMUSCULAR | Status: AC
  Filled 2013-12-03: qty 2

## 2013-12-03 MED ORDER — BUPIVACAINE-EPINEPHRINE (PF) 0.25% -1:200000 IJ SOLN
INTRAMUSCULAR | Status: AC
  Filled 2013-12-03: qty 30

## 2013-12-03 MED ORDER — ONDANSETRON HCL 4 MG/2ML IJ SOLN
INTRAMUSCULAR | Status: DC | PRN
  Administered 2013-12-03: 4 mg via INTRAVENOUS

## 2013-12-03 MED ORDER — HYDROMORPHONE HCL PF 1 MG/ML IJ SOLN
0.2500 mg | INTRAMUSCULAR | Status: DC | PRN
  Administered 2013-12-03 (×3): 0.5 mg via INTRAVENOUS

## 2013-12-03 MED ORDER — MORPHINE SULFATE 2 MG/ML IJ SOLN
2.0000 mg | INTRAMUSCULAR | Status: DC | PRN
  Administered 2013-12-03 – 2013-12-04 (×3): 2 mg via INTRAVENOUS
  Filled 2013-12-03 (×3): qty 1

## 2013-12-03 MED ORDER — OXYCODONE HCL 5 MG/5ML PO SOLN: 5.0000 mg | Freq: Once | ORAL | Status: DC | PRN

## 2013-12-03 MED ORDER — PROPOFOL 10 MG/ML IV BOLUS
INTRAVENOUS | Status: DC | PRN
  Administered 2013-12-03: 200 mg via INTRAVENOUS

## 2013-12-03 SURGICAL SUPPLY — 40 items
ADH SKN CLS APL DERMABOND .7 (GAUZE/BANDAGES/DRESSINGS) ×1
APPLIER CLIP 5 13 M/L LIGAMAX5 (MISCELLANEOUS) ×2
APR CLP MED LRG 5 ANG JAW (MISCELLANEOUS) ×1
BAG SPEC RTRVL 10 TROC 200 (ENDOMECHANICALS) ×1
BLADE SURG ROTATE 9660 (MISCELLANEOUS) IMPLANT
CANISTER SUCTION 2500CC (MISCELLANEOUS) ×2 IMPLANT
CHLORAPREP W/TINT 26ML (MISCELLANEOUS) ×2 IMPLANT
CLIP APPLIE 5 13 M/L LIGAMAX5 (MISCELLANEOUS) ×1 IMPLANT
COVER MAYO STAND STRL (DRAPES) ×2 IMPLANT
COVER SURGICAL LIGHT HANDLE (MISCELLANEOUS) ×2 IMPLANT
DERMABOND ADVANCED (GAUZE/BANDAGES/DRESSINGS) ×1
DERMABOND ADVANCED .7 DNX12 (GAUZE/BANDAGES/DRESSINGS) ×1 IMPLANT
DRAPE C-ARM 42X72 X-RAY (DRAPES) ×2 IMPLANT
ELECT REM PT RETURN 9FT ADLT (ELECTROSURGICAL) ×2
ELECTRODE REM PT RTRN 9FT ADLT (ELECTROSURGICAL) ×1 IMPLANT
ENDOLOOP SUT PDS II  0 18 (SUTURE) ×1
ENDOLOOP SUT PDS II 0 18 (SUTURE) IMPLANT
GLOVE BIO SURGEON STRL SZ7 (GLOVE) ×2 IMPLANT
GLOVE BIOGEL PI IND STRL 7.5 (GLOVE) ×1 IMPLANT
GLOVE BIOGEL PI INDICATOR 7.5 (GLOVE) ×1
GOWN STRL REUS W/ TWL LRG LVL3 (GOWN DISPOSABLE) ×4 IMPLANT
GOWN STRL REUS W/TWL LRG LVL3 (GOWN DISPOSABLE) ×8
KIT BASIN OR (CUSTOM PROCEDURE TRAY) ×2 IMPLANT
KIT ROOM TURNOVER OR (KITS) ×2 IMPLANT
NS IRRIG 1000ML POUR BTL (IV SOLUTION) ×2 IMPLANT
PAD ARMBOARD 7.5X6 YLW CONV (MISCELLANEOUS) ×2 IMPLANT
PENCIL BUTTON HOLSTER BLD 10FT (ELECTRODE) ×1 IMPLANT
POUCH RETRIEVAL ECOSAC 10 (ENDOMECHANICALS) ×1 IMPLANT
POUCH RETRIEVAL ECOSAC 10MM (ENDOMECHANICALS) ×1
SCISSORS LAP 5X35 DISP (ENDOMECHANICALS) ×2 IMPLANT
SET CHOLANGIOGRAPH 5 50 .035 (SET/KITS/TRAYS/PACK) ×2 IMPLANT
SET IRRIG TUBING LAPAROSCOPIC (IRRIGATION / IRRIGATOR) ×2 IMPLANT
SLEEVE ENDOPATH XCEL 5M (ENDOMECHANICALS) ×4 IMPLANT
SPECIMEN JAR SMALL (MISCELLANEOUS) ×2 IMPLANT
SUT MNCRL AB 4-0 PS2 18 (SUTURE) ×2 IMPLANT
TOWEL OR 17X24 6PK STRL BLUE (TOWEL DISPOSABLE) ×2 IMPLANT
TOWEL OR 17X26 10 PK STRL BLUE (TOWEL DISPOSABLE) ×2 IMPLANT
TRAY LAPAROSCOPIC (CUSTOM PROCEDURE TRAY) ×2 IMPLANT
TROCAR XCEL BLUNT TIP 100MML (ENDOMECHANICALS) ×2 IMPLANT
TROCAR XCEL NON-BLD 5MMX100MML (ENDOMECHANICALS) ×2 IMPLANT

## 2013-12-03 NOTE — Progress Notes (Signed)
Unable to complete head to toe assessment prior to patient going to OR. Patient resting comfortably in bed. No distress noted. Denies pain. Preop checklist completed. CBG 132 - 1 unit novolog administered prior to going to OR as ordered. Telemetry box removed and CMT Darryl notified. Report called to shortstay RN.   Miroslav Gin Donnie Aho, RN

## 2013-12-03 NOTE — Op Note (Signed)
Preoperative diagnosis: Gallstone pancreatitis  Postoperative diagnosis: Same as above  Procedure: Laparoscopic cholecystectomy  Surgeon: Dr. Serita Grammes  Anesthesia: Gen.  Estimated blood loss: Minimal  Drains: None  Specimens: Gallbladder and contents to pathology  Complications: None  Sponge needle count was correct completion  Disposition to recovery in stable condition   Indications: This is a 81 yom admitted for likely gallstone pancreatitis that has resolved.  We discussed laparoscopic cholecystectomy with risks and benefits associated with that.  Procedure: After informed consent was obtained the patient was taken to the operating room. He was given cefoxitin. Sequential compression devices were on his legs. He was placed under general anesthesia without complication. His abdomen was prepped and draped in the standard sterile surgical fashion. A surgical timeout was then performed.  I made a vertical incision below the umbilicus. I grasped the fascia and entered it sharply. I then entered the peritoneum bluntly. This operation was difficult due to his obesity. I then placed a 0 Vicryl pursestring suture through the fascia. I inserted a Hassan trocar insufflated the abdomen to 15 mm mercury pressure. I then inserted 3 further 5 mm trocars in the epigastrium and right abdomen under direct vision without complication. The gallbladder was then retracted cephalad. There were a lot of adhesions from the omentum. These were taken down bluntly. I was eventually able to retract the gallbladder cephalad and lateral. I was able to identify the critical view of safety. Once I had done this I was going to attempt to do a cholangiogram. The concern was that his duct and gallbladder were both ill-appearing. I made a rent in the gallbladder when I was retracting it due to the fact that it was very inflamed. I elected then to place clips across the artery and divided leaving 2 clips in place. I then treated  the cystic duct in a similar fashion. I also placed a 0 Vicryl Endoloop on the cystic duct stump due to my concern for its viability. Once I did this I then removed the gallbladder from the liver bed without difficulty. This was placed in a bag and removed from the umbilicus. I then evacuated all of the bile that had spilled. I irrigated copiously. I then obtained hemostasis in the liver bed. I did place a piece of Surgicel Snow in the liver bed. I then removed the hasson trocar. I tied my pursestring. I used the Endo Close device and a 0 Vicryl to close and place an additional suture to close the fascia at his umbilicus. Once identify removed all trocars and desufflated the abdomen. These were closed with 4-0 Monocryl and Dermabond. He was extubated and transferred to recovery stable.

## 2013-12-03 NOTE — Anesthesia Preprocedure Evaluation (Addendum)
Anesthesia Evaluation  Patient identified by MRN, date of birth, ID band Patient awake    Reviewed: Allergy & Precautions, H&P , NPO status , Patient's Chart, lab work & pertinent test results  Airway Mallampati: II  Neck ROM: full    Dental   Pulmonary asthma , pneumonia -, resolved,          Cardiovascular hypertension, Pt. on medications     Neuro/Psych    GI/Hepatic   Endo/Other  diabetes, Type 2, Oral Hypoglycemic Agents, Insulin Dependentobese  Renal/GU ARFRenal diseaseCr was 1.65 on 11/29/13. Now improved.     Musculoskeletal   Abdominal   Peds  Hematology   Anesthesia Other Findings Last asthma exacerbation 3 weeks ago; pt. Uses inhaler and previous Prednisone use. Hx Pancreatitis Hx Kidney Stones  Reproductive/Obstetrics                        Anesthesia Physical Anesthesia Plan  ASA: II  Anesthesia Plan: General   Post-op Pain Management:    Induction: Intravenous  Airway Management Planned: Oral ETT  Additional Equipment:   Intra-op Plan:   Post-operative Plan: Extubation in OR  Informed Consent: I have reviewed the patients History and Physical, chart, labs and discussed the procedure including the risks, benefits and alternatives for the proposed anesthesia with the patient or authorized representative who has indicated his/her understanding and acceptance.     Plan Discussed with: CRNA, Anesthesiologist and Surgeon  Anesthesia Plan Comments:         Anesthesia Quick Evaluation

## 2013-12-03 NOTE — Progress Notes (Signed)
[facility] PROGRESS NOTE  Kabir Brannock MWU:[id number] DOB: 24-Mar-1966 DOA: 11/29/2013 PCP: [name], MD  Assessment/Plan: #1 acute pancreatitis Likely secondary to gallstone pancreatitis plus or minus alcohol use however patient states does not drink that much alcohol drinks about one to 2 beers a day. CT of the abdomen and pelvis as well as abdominal ultrasound consistent with cholelithiasis without evidence of acute cholecystitis or biliary dilatation. Clinical improvement. Lipase levels improving and lipase now at 109 from127 from 206 from 454 from 722 on admission. Fasting lipid panel with a triglyceride level of 153. Patient currently tolerating full liquids. Continue IV fluids. Patient has been seen by general surgery and patient s/p laparoscopic cholecystectomy this morning. Appreciate general surgery input and recommendations.  #2 acute renal failure Likely secondary to prerenal azotemia. Improved with hydration. Abdominal ultrasound with nephrolithiasis without hydronephrosis. Will follow.  #3 cholelithiasis General surgical consultation has been obtained and recommended laparoscopic cholecystectomy as patient had presented with acute pancreatitis which may have been secondary to gallstone pancreatitis. Patient s/p lap cholecystectomy today per [facility].  #4 nephrolithiasis Currently asymptomatic. Outpatient followup.  #5 hypertension Stable. Patient started on IV enalapril. Once tolerating oral intake will resume home medications.  #6 history of asthma COPD Stable. Continue inhalers.  #7 diabetes mellitus Hemoglobin A1c was 7.0 11/09/2013. CBGs have ranged from 147-158. Continue sliding scale insulin. Follow.  #8 history of alcohol use Currently stable. No signs of withdrawal. Continue thiamine, folic acid, Ativan CIWA protocol  #9 prophylaxis PPI for GI prophylaxis. Lovenox for DVT prophylaxis.  Code Status: Full Family Communication: Updated patient and wife at  bedside. Disposition Plan: Home in medically stable.   Consultants:  General Surgery : Dr Lucia Gaskins 11/30/13  Procedures:  CT abdomen and pelvis 11/30/2013  Abdominal ultrasound 11/30/2013  Laparoscopic cholecystectomy 12/03/13 per Dr Donne Hazel.  Antibiotics:  None  HPI/Subjective: Patient states no further abdominal pain. Patient with no emesis.  Objective: Filed Vitals:   12/03/13 1228  BP: 167/94  Pulse: 86  Temp: 98 F (36.7 C)  Resp: 16    Intake/Output Summary (Last 24 hours) at 12/03/13 1333 Last data filed at 12/03/13 1200  Gross per 24 hour  Intake 3586.67 ml  Output    800 ml  Net 2786.67 ml   Filed Weights   11/30/13 2056 12/01/13 2046 12/02/13 2003  Weight: 118.75 kg (261 lb 12.7 oz) 119.659 kg (263 lb 12.8 oz) 119.205 kg (262 lb 12.8 oz)    Exam:   General:  NAD  Cardiovascular: RRR  Respiratory: CTAB  Abdomen: Soft, nontender, nondistended, positive bowel sounds.  Musculoskeletal: No clubbing cyanosis or edema.  Data Reviewed: Basic Metabolic Panel:  Recent Labs Lab 11/29/13 1732 11/30/13 0644 12/01/13 0545 12/02/13 0538 12/03/13 0715  NA 130* 134* 140 144 142  K 4.7 4.2 4.5 4.4 4.2  CL 92* 96 104 108 106  CO2 22 23 22 23 22   GLUCOSE 217* 135* 129* 108* 125*  BUN 21 22 15 8 7   CREATININE 1.65* 1.30 0.86 0.70 0.81  CALCIUM 9.0 8.4 8.5 8.8 9.0   Liver Function Tests:  Recent Labs Lab 11/29/13 1732 11/30/13 0644 12/01/13 0545 12/02/13 0538 12/03/13 0715  AST 20 15 18 19 22   ALT 43 34 33 32 29  ALKPHOS 85 76 76 77 79  BILITOT 0.4 0.4 0.4 0.3 0.4  PROT 7.9 7.1 7.2 7.7 8.1  ALBUMIN 3.6 3.3* 3.3* 3.3* 3.6    Recent Labs Lab 11/29/13 1732 11/30/13 0644 12/01/13 0545 12/02/13  4818 12/03/13 0715  LIPASE 722* 454* 206* 127* 109*   No results found for this basename: AMMONIA,  in the last 168 hours CBC:  Recent Labs Lab 11/29/13 1732 11/30/13 0644 12/01/13 0545 12/02/13 0538 12/03/13 0715  WBC 8.3 7.3 6.3 5.9  6.4  NEUTROABS 3.7 2.7  --   --   --   HGB 13.9 13.1 12.6* 13.2 13.5  HCT 39.6 36.8* 35.8* 37.3* 39.2  MCV 86.1 85.8 86.5 87.1 87.7  PLT 264 217 214 224 242   Cardiac Enzymes: No results found for this basename: CKTOTAL, CKMB, CKMBINDEX, TROPONINI,  in the last 168 hours BNP (last 3 results)  Recent Labs  07/18/13 0216 11/09/13 1530  PROBNP 29.7 85.6   CBG:  Recent Labs Lab 12/02/13 1722 12/02/13 2146 12/03/13 0828 12/03/13 1113 12/03/13 1226  GLUCAP 123* 175* 132* 158* 147*    Recent Results (from the past 240 hour(s))  SURGICAL PCR SCREEN     Status: Abnormal   Collection Time    12/01/13  3:41 AM      Result Value Ref Range Status   MRSA, PCR NEGATIVE  NEGATIVE Final   Staphylococcus aureus POSITIVE (*) NEGATIVE Final   Comment:            The Xpert SA Assay (FDA     approved for NASAL specimens     in patients over 50 years of age),     is one component of     a comprehensive surveillance     program.  Test performance has     been validated by [employer] for patients greater     than or equal to 50 year old.     It is not intended     to diagnose infection nor to     guide or monitor treatment.     Studies: No results found.  Scheduled Meds: . enalaprilat  1.25 mg Intravenous 4 times per day  . enoxaparin (LOVENOX) injection  40 mg Subcutaneous Q24H  . folic acid  1 mg Oral Daily  . gabapentin  300 mg Oral TID  . insulin aspart  0-9 Units Subcutaneous TID WC  . multivitamin with minerals  1 tablet Oral Daily  . mupirocin ointment  1 application Nasal BID  . pantoprazole  40 mg Oral Q0600  . sodium chloride  3 mL Intravenous Q12H  . thiamine  100 mg Oral Daily   Or  . thiamine  100 mg Intravenous Daily   Continuous Infusions: . sodium chloride 0.9 % 1,000 mL infusion 100 mL/hr at 12/02/13 5631    Principal Problem:   Acute pancreatitis Active Problems:   Hypertension   Diabetes   Acute renal failure   Kidney stones    Cholelithiasis: PER CT ABD/PELVIS AND ABD [place] 11/30/13    Time spent: 35 minutes    Eugenie Filler M.D.  [facility] Pager [phone]. If 7PM-7AM, please contact night-coverage at [email or web address], password [facility] 12/03/2013, 1:33 PM  LOS: 4 days

## 2013-12-03 NOTE — Anesthesia Postprocedure Evaluation (Signed)
Anesthesia Post Note  Patient: Troy [name]  Procedure(s) Performed: Procedure(s) (LRB): LAPAROSCOPIC CHOLECYSTECTOMY WITH INTRAOPERATIVE CHOLANGIOGRAM (N/A)  Anesthesia type: General  Patient location: PACU  Post pain: Pain level controlled and Adequate analgesia  Post assessment: Post-op Vital signs reviewed, Patient's Cardiovascular Status Stable, Respiratory Function Stable, Patent Airway and Pain level controlled  Last Vitals:  Filed Vitals:   12/03/13 1130  BP: 174/96  Pulse: 86  Temp:   Resp: 12    Post vital signs: Reviewed and stable  Level of consciousness: awake, alert  and oriented  Complications: No apparent anesthesia complications

## 2013-12-03 NOTE — Progress Notes (Signed)
Day of Surgery  Subjective: Feels fine, no complaints  Objective: Vital signs in last 24 hours: Temp:  [97.8 F (36.6 C)-98.3 F (36.8 C)] 98.1 F (36.7 C) (05/09 0418) Pulse Rate:  [80-89] 83 (05/09 0418) Resp:  [17-19] 17 (05/09 0418) BP: (127-179)/(87-122) 155/90 mmHg (05/09 0418) SpO2:  [93 %-100 %] 100 % (05/09 0418) Weight:  [262 lb 12.8 oz (119.205 kg)] 262 lb 12.8 oz (119.205 kg) (05/08 2003) Last BM Date: 12/01/13  Intake/Output from previous day: 05/08 0701 - 05/09 0700 In: 2686.7 [P.O.:300; I.V.:2386.7] Out: 300 [Urine:300] Intake/Output this shift:    General appearance: no distress GI: nontender nondistended   Lab Results:   Recent Labs  12/01/13 0545 12/02/13 0538  WBC 6.3 5.9  HGB 12.6* 13.2  HCT 35.8* 37.3*  PLT 214 224   BMET  Recent Labs  12/01/13 0545 12/02/13 0538  NA 140 144  K 4.5 4.4  CL 104 108  CO2 22 23  GLUCOSE 129* 108*  BUN 15 8  CREATININE 0.86 0.70  CALCIUM 8.5 8.8   PT/INR  Recent Labs  12/01/13 0545  LABPROT 12.3  INR 0.93    Assessment/Plan: Gallstone pancreatitis (likely source), resolved clinically Lap chole today, I reviewed history, reviewed all labs/imaging I discussed the procedure in detail. We discussed the risks and benefits of a laparoscopic cholecystectomy and possible cholangiogram including, but not limited to bleeding, infection, injury to surrounding structures such as the intestine or liver, bile leak, retained gallstones, need to convert to an open procedure, prolonged diarrhea, blood clots such as  DVT, common bile duct injury, anesthesia risks, and possible need for additional procedures.  The likelihood of improvement in symptoms and return to the patient's normal status is good. We discussed the typical post-operative recovery course.   Rolm Bookbinder 12/03/2013

## 2013-12-03 NOTE — Anesthesia Procedure Notes (Signed)
Procedure Name: Intubation Date/Time: 12/03/2013 9:37 AM Performed by: Storm Frisk E Pre-anesthesia Checklist: Patient identified, Timeout performed, Emergency Drugs available, Suction available and Patient being monitored Patient Re-evaluated:Patient Re-evaluated prior to inductionOxygen Delivery Method: Circle system utilized Preoxygenation: Pre-oxygenation with 100% oxygen Intubation Type: IV induction and Cricoid Pressure applied Ventilation: Two handed mask ventilation required Laryngoscope Size: Mac and 3 Grade View: Grade I Tube type: Oral Tube size: 8.0 mm Number of attempts: 2 Airway Equipment and Method: Stylet Placement Confirmation: ETT inserted through vocal cords under direct vision,  breath sounds checked- equal and bilateral and positive ETCO2 Secured at: 24 cm Tube secured with: Tape Dental Injury: Teeth and Oropharynx as per pre-operative assessment  Difficulty Due To: Difficulty was anticipated, Difficult Airway- due to anterior larynx and Difficult Airway-  due to edematous airway Future Recommendations: Recommend- induction with short-acting agent, and alternative techniques readily available Comments: DL x 1 by Storm Frisk, CRNA with MAC 3 unsuccessful- vocal cords not visualized; DL x 1 by Dr. Marcie Bal successful with vocal cords visualized.  Pt. Had excessive redundant tissue in the airway and on the neck making masking difficult.

## 2013-12-03 NOTE — Transfer of Care (Signed)
Immediate Anesthesia Transfer of Care Note  Patient: Troy [name]  Procedure(s) Performed: Procedure(s): LAPAROSCOPIC CHOLECYSTECTOMY WITH INTRAOPERATIVE CHOLANGIOGRAM (N/A)  Patient Location: PACU  Anesthesia Type:General  Level of Consciousness: awake, alert  and oriented  Airway & Oxygen Therapy: Patient Spontanous Breathing and Patient connected to nasal cannula oxygen  Post-op Assessment: Report given to PACU RN and Post -op Vital signs reviewed and stable  Post vital signs: Reviewed and stable  Complications: No apparent anesthesia complications

## 2013-12-03 NOTE — Progress Notes (Signed)
Patient returned from OR. Abdomen with 4 incisions - all clean, dry, intact, and approximated. Steri strips present on all 4 incisions. Patient without s/s distress or pain. Will monitor.  Glendy Barsanti Donnie Aho, RN

## 2013-12-04 LAB — CBC
HEMATOCRIT: 36.6 % — AB (ref 39.0–52.0)
HEMOGLOBIN: 12.8 g/dL — AB (ref 13.0–17.0)
MCH: 30.7 pg (ref 26.0–34.0)
MCHC: 35 g/dL (ref 30.0–36.0)
MCV: 87.8 fL (ref 78.0–100.0)
Platelets: 200 10*3/uL (ref 150–400)
RBC: 4.17 MIL/uL — AB (ref 4.22–5.81)
RDW: 13.3 % (ref 11.5–15.5)
WBC: 6.5 10*3/uL (ref 4.0–10.5)

## 2013-12-04 LAB — COMPREHENSIVE METABOLIC PANEL
ALBUMIN: 3.2 g/dL — AB (ref 3.5–5.2)
ALT: 53 U/L (ref 0–53)
AST: 59 U/L — AB (ref 0–37)
Alkaline Phosphatase: 69 U/L (ref 39–117)
BUN: 6 mg/dL (ref 6–23)
CALCIUM: 8.7 mg/dL (ref 8.4–10.5)
CO2: 23 mEq/L (ref 19–32)
Chloride: 107 mEq/L (ref 96–112)
Creatinine, Ser: 0.85 mg/dL (ref 0.50–1.35)
GFR calc Af Amer: >90 mL/min (ref >90)
GFR calc non Af Amer: >90 mL/min (ref >90)
Glucose, Bld: 100 mg/dL — ABNORMAL HIGH (ref 70–99)
Potassium: 4 mEq/L (ref 3.7–5.3)
Sodium: 145 mEq/L (ref 137–147)
Total Bilirubin: 0.5 mg/dL (ref 0.3–1.2)
Total Protein: 7 g/dL (ref 6.0–8.3)

## 2013-12-04 LAB — GLUCOSE, CAPILLARY
Glucose-Capillary: 117 mg/dL — ABNORMAL HIGH (ref 70–99)
Glucose-Capillary: 132 mg/dL — ABNORMAL HIGH (ref 70–99)

## 2013-12-04 LAB — LIPASE, BLOOD: Lipase: 62 U/L — ABNORMAL HIGH (ref 11–59)

## 2013-12-04 MED ORDER — ENALAPRILAT 1.25 MG/ML IV SOLN
1.2500 mg | Freq: Four times a day (QID) | INTRAVENOUS | Status: DC
  Administered 2013-12-04 (×2): 1.25 mg via INTRAVENOUS
  Filled 2013-12-04 (×5): qty 1

## 2013-12-04 MED ORDER — OXYCODONE-ACETAMINOPHEN 5-325 MG PO TABS: 1.0000 | ORAL_TABLET | ORAL | Status: DC | PRN

## 2013-12-04 NOTE — Discharge Summary (Signed)
Physician Discharge Summary  Troy [name] GLO:[id number] DOB: 10/07/1965 DOA: 11/29/2013  PCP: [name], MD  Admit date: 11/29/2013 Discharge date: 12/04/2013  Time spent: 65 minutes  Recommendations for Outpatient Follow-up:  1. Followup with general surgery as outpatient, as scheduled. 2. Followup with AVBUERE,EDWIN A, MD in 1 week. On followup basic metabolic profile needs to be obtained to followup on patient's electrolytes and renal function. Patient's blood pressure also needs. Assessed per PCP.  Discharge Diagnoses:  Principal Problem:   Acute pancreatitis Active Problems:   Hypertension   Diabetes   Acute renal failure   Kidney stones   Cholelithiasis: PER CT ABD/PELVIS AND ABD [place] 11/30/13   Discharge Condition: Stable and improved  Diet recommendation: Carb modified  Filed Weights   12/01/13 2046 12/02/13 2003 12/03/13 2023  Weight: 119.659 kg (263 lb 12.8 oz) 119.205 kg (262 lb 12.8 oz) 119.115 kg (262 lb 9.6 oz)    History of present illness:  Troy [name] is a 48 y.o. male who was recently admitted for bronchial asthma exacerbation presented to the ER because of abdominal pain. Patient has been having abdominal pain over last 4 days. Initially the first day he had nausea and vomiting. Denies any blood in the vomitus. Denies any diarrhea. Pain is mostly in the epigastric area radiating to the back stabbing in nature constant. He had gone to his PCP and was prescribed PPIs as per the patient, yesterday. Since the pain was persistent patient came to the ER and labs reveal elevated lipase level. Patient states he used to drink a lot of alcohol previously but has cut down now. Patient probably has acute pancreatitis. Denies any chest pain shortness of breath headache visual symptoms or focal deficits.    Hospital Course:  #1 acute pancreatitis  Likely secondary to gallstone pancreatitis plus or minus alcohol use however patient states does not drink that much  alcohol drinks about one to 2 beers a day. CT of the abdomen and pelvis as well as abdominal ultrasound consistent with cholelithiasis without evidence of acute cholecystitis or biliary dilatation. Patient was initially placed on bowel rest. Patient was hydrated with IV fluids, placed on anti-emetics, and pain management. On admission patient was noted to have a lipase level of 722. Patient's lipase levels trending down such that by day of discharge his lipase was down to 62. Patient improved clinically. General surgical consultation was obtained as patient was noted to have multiple gallstones. Patient was started on clear liquids and his diet advanced which he tolerated. Fasting lipid panel with a triglyceride level of 153. Patient  was seen by general surgery and patient subsequently underwent a laparoscopic cholecystectomy  on 12/03/2013. Patient tolerated procedure well. Patient improved clinically diet was advanced to regular diet which he tolerated. Patient discharged in stable and improved condition. Patient is to followup with general surgery and PCP as outpatient. #2 acute renal failure  Likely secondary to prerenal azotemia. Abdominal ultrasound with nephrolithiasis without hydronephrosis. Patient's acute renal failure resolved with hydration. Outpatient followup.  #3 cholelithiasis  General surgical consultation has been obtained and recommended laparoscopic cholecystectomy as patient had presented with acute pancreatitis which may have been secondary to gallstone pancreatitis. Patient s/p lap cholecystectomy on 12/03/2013 per [facility] without any complications. Outpatient followup.  #4 nephrolithiasis  Currently asymptomatic. Outpatient followup.  #5 hypertension  Stable. Patient started on IV enalapril, as he was on bowel rest and also in anticipation of laparoscopic cholecystectomy. Patient's blood pressure remained stable on IV enalapril. Patient  was discharged home on his home regimen. Patient  will need to followup with PCP as outpatient. #6 history of asthma COPD  Stable. Maintained on inhalers.  #7 diabetes mellitus  Hemoglobin A1c was 7.0 11/09/2013. CBGs have ranged from 117-144. Patient's oral hypoglycemic agents were held. Patient was maintained on sliding scale insulin.  #8 history of alcohol use  Currently stable. No signs of withdrawal. Patient was maintained on thiamine, folic acid, Ativan CIWA protocol. Remained stable.     Procedures: CT abdomen and pelvis 11/30/2013  Abdominal ultrasound 11/30/2013  Laparoscopic cholecystectomy 12/03/13 per Dr Donne Hazel.     Consultations: General Surgery : Dr Lucia Gaskins 11/30/13   Discharge Exam: Filed Vitals:   12/04/13 1002  BP: 154/92  Pulse: 93  Temp: 99.3 F (37.4 C)  Resp: 17    General: NAD Cardiovascular: RRR Respiratory: CTAB  Discharge Instructions You were cared for by a hospitalist during your hospital stay. If you have any questions about your discharge medications or the care you received while you were in the hospital after you are discharged, you can call the unit and asked to speak with the hospitalist on call if the hospitalist that took care of you is not available. Once you are discharged, your primary care physician will handle any further medical issues. Please note that NO REFILLS for any discharge medications will be authorized once you are discharged, as it is imperative that you return to your primary care physician (or establish a relationship with a primary care physician if you do not have one) for your aftercare needs so that they can reassess your need for medications and monitor your lab values.      Discharge Orders   Future Appointments Provider Department Dept Phone   12/27/2013 2:00 PM [facility], [place] [phone]   Future Orders Complete By Expires   Diet Carb Modified  As directed    Discharge instructions  As directed    Increase activity slowly   As directed        Medication List         albuterol 108 (90 BASE) MCG/ACT inhaler  Commonly known as:  PROVENTIL HFA;VENTOLIN HFA  Inhale 2 puffs into the lungs every 6 (six) hours as needed for wheezing or shortness of breath.     cetirizine 10 MG tablet  Commonly known as:  ZYRTEC  Take 10 mg by mouth daily.     gabapentin 300 MG capsule  Commonly known as:  NEURONTIN  Take 300 mg by mouth 3 (three) times daily.     glimepiride 4 MG tablet  Commonly known as:  AMARYL  Take 4 mg by mouth daily with breakfast.     hydrOXYzine 25 MG tablet  Commonly known as:  ATARAX/VISTARIL  Take 25 mg by mouth daily as needed. For sleep     ipratropium-albuterol 0.5-2.5 (3) MG/3ML Soln  Commonly known as:  DUONEB  Take 3 mLs by nebulization every 6 (six) hours as needed. Wheezing     lisinopril-hydrochlorothiazide 20-25 MG per tablet  Commonly known as:  PRINZIDE,ZESTORETIC  Take 1 tablet by mouth daily.     metFORMIN 1000 MG tablet  Commonly known as:  GLUCOPHAGE  Take 1,000 mg by mouth 2 (two) times daily with a meal.     oxyCODONE-acetaminophen 5-325 MG per tablet  Commonly known as:  PERCOCET/ROXICET  Take 1-2 tablets by mouth every 4 (four) hours as needed for moderate pain.  predniSONE 10 MG tablet  Commonly known as:  STERAPRED UNI-PAK  Take 1 tablet by mouth See admin instructions. Take 6-5-4-3-2-1 PO daily till gone. Patient completed dose a week ago from today (11-29-13)     tadalafil 20 MG tablet  Commonly known as:  CIALIS  Take 20 mg by mouth daily as needed for erectile dysfunction.       No Known Allergies Follow-up Information   Follow up with [facility] [place] On 12/27/2013. (arrive by 1:30Pm for a 2PM appt for a post op check)    Contact information:   [place] [place]   [place] [address] [phone]       Follow up with [name], MD. Schedule an appointment as soon as possible for a visit in 1 week.   Specialty:  Internal  Medicine   Contact information:   [address] [place] [address] [phone]        The results of significant diagnostics from this hospitalization (including imaging, microbiology, ancillary and laboratory) are listed below for reference.    Significant Diagnostic Studies: Ct Abdomen Pelvis Wo Contrast  11/30/2013   CLINICAL DATA:  Abdominal pain and elevated lipase  EXAM: CT ABDOMEN AND PELVIS WITHOUT CONTRAST  TECHNIQUE: Multidetector CT imaging of the abdomen and pelvis was performed following the standard protocol without IV contrast.  COMPARISON:  04/01/2004.  FINDINGS: BODY WALL: Right larger than left fatty inguinal hernias.  LOWER CHEST: Unremarkable.  ABDOMEN/PELVIS:  Liver: No focal abnormality.  Biliary: Layering cholelithiasis. Gallbladder is distended but does not appear inflamed.  Pancreas: Expansion of the pancreatic head and uncinate process with peripancreatic edema. No loculated collection. No peripancreatic hemorrhage. No ductal dilatation.  Spleen: Unremarkable.  Adrenals: Unremarkable.  Kidneys and ureters: Bilateral nephrolithiasis, the largest stone in the right lower pole, 11 mm. No hydronephrosis or ureteral calculus.  Bladder: Decompressed.  Reproductive: Unremarkable.  Bowel: No obstruction. Normal appendix.  Retroperitoneum: Chronic inguinal lymphadenopathy, also seeing in 2005. The largest node is in the right external iliac chain, measuring 11 mm short axis.  Peritoneum: No free fluid or gas.  Vascular: No acute abnormality.  OSSEOUS: Lipomatosis changes in the region of the right iliopsoas distal attachment.  IMPRESSION: 1. Acute pancreatitis.  No fluid collection. 2. Cholelithiasis. 3. Bilateral nephrolithiasis.   Electronically Signed   By: Jorje Guild M.D.   On: 11/30/2013 02:47   Dg Chest 2 View  11/10/2013   CLINICAL DATA:  Shortness of breath, dyspnea  EXAM: CHEST  2 VIEW  COMPARISON:  November 09, 2013  FINDINGS: The heart size and mediastinal  contours are within normal limits. There is no focal infiltrate, pulmonary edema, or pleural effusion. The visualized skeletal structures are stable.  IMPRESSION: No active cardiopulmonary disease.   Electronically Signed   By: Abelardo Diesel M.D.   On: 11/10/2013 08:20   Dg Chest 2 View  11/09/2013   CLINICAL DATA:  Shortness of breath.  EXAM: CHEST - 2 VIEW  COMPARISON:  DG CHEST 1V PORT dated 07/18/2013; DG CHEST 2 VIEW dated 07/13/2013  FINDINGS: The heart size and mediastinal contours are within normal limits. There is no evidence of pulmonary edema, consolidation, pneumothorax, nodule or pleural fluid. There is mild chronic eventration of the anterior right hemidiaphragm. Stable mild degenerative changes of the thoracic spine.  IMPRESSION: No active disease.   Electronically Signed   By: Aletta Edouard M.D.   On: 11/09/2013 17:53   US Abdomen  Complete  11/30/2013   CLINICAL DATA:  Pancreatitis.  Abdominal pain.  EXAM: ULTRASOUND ABDOMEN COMPLETE  COMPARISON:  CT abdomen and pelvis 11/30/2013  FINDINGS: Gallbladder:  Multiple small stones without wall thickening. No sonographic Murphy sign noted.  Common bile duct:  Diameter: 3.9 mm  Liver:  Diffusely increased echogenicity without focal lesion identified.  IVC:  No abnormality visualized.  Pancreas:  Not well visualized. Visualized portion of the head appears hypoechoic.  Spleen:  Size and appearance within normal limits.  Right Kidney:  Length: 11.5 cm.  1.5 cm lower pole stone.  No hydronephrosis.  Left Kidney:  Length: 12.5 cm. 1.5 cm linear echogenic focus in the lower pole may reflect a stone but there was no clear posterior acoustic shadowing. No hydronephrosis.  Abdominal aorta:  No aneurysm visualized.  Other findings:  None.  IMPRESSION: 1. Pancreas not well visualized, although hypoechoic appearance of visualized portion is compatible with acute pancreatitis. 2. No biliary dilatation. 3. Cholelithiasis.  No evidence of acute cholecystitis. 4.  Increased echogenicity of the liver, suggestive of steatosis. 5. Nephrolithiasis without hydronephrosis.   Electronically Signed   By: Logan Bores   On: 11/30/2013 08:01    Microbiology: Recent Results (from the past 240 hour(s))  SURGICAL PCR SCREEN     Status: Abnormal   Collection Time    12/01/13  3:41 AM      Result Value Ref Range Status   MRSA, PCR NEGATIVE  NEGATIVE Final   Staphylococcus aureus POSITIVE (*) NEGATIVE Final   Comment:            The Xpert SA Assay (FDA     approved for NASAL specimens     in patients over 61 years of age),     is one component of     a comprehensive surveillance     program.  Test performance has     been validated by [employer] for patients greater     than or equal to 13 year old.     It is not intended     to diagnose infection nor to     guide or monitor treatment.     Labs: Basic Metabolic Panel:  Recent Labs Lab 11/30/13 0644 12/01/13 0545 12/02/13 0538 12/03/13 0715 12/04/13 0520  NA 134* 140 144 142 145  K 4.2 4.5 4.4 4.2 4.0  CL 96 104 108 106 107  CO2 23 22 23 22 23   GLUCOSE 135* 129* 108* 125* 100*  BUN 22 15 8 7 6   CREATININE 1.30 0.86 0.70 0.81 0.85  CALCIUM 8.4 8.5 8.8 9.0 8.7   Liver Function Tests:  Recent Labs Lab 11/30/13 0644 12/01/13 0545 12/02/13 0538 12/03/13 0715 12/04/13 0520  AST 15 18 19 22  59*  ALT 34 33 32 29 53  ALKPHOS 76 76 77 79 69  BILITOT 0.4 0.4 0.3 0.4 0.5  PROT 7.1 7.2 7.7 8.1 7.0  ALBUMIN 3.3* 3.3* 3.3* 3.6 3.2*    Recent Labs Lab 11/30/13 0644 12/01/13 0545 12/02/13 0538 12/03/13 0715 12/04/13 0520  LIPASE 454* 206* 127* 109* 62*   No results found for this basename: AMMONIA,  in the last 168 hours CBC:  Recent Labs Lab 11/29/13 1732 11/30/13 0644 12/01/13 0545 12/02/13 0538 12/03/13 0715 12/04/13 0520  WBC 8.3 7.3 6.3 5.9 6.4 6.5  NEUTROABS 3.7 2.7  --   --   --   --   HGB 13.9 13.1 12.6* 13.2 13.5  12.8*  HCT 39.6 36.8* 35.8* 37.3* 39.2 36.6*   MCV 86.1 85.8 86.5 87.1 87.7 87.8  PLT 264 217 214 224 242 200   Cardiac Enzymes: No results found for this basename: CKTOTAL, CKMB, CKMBINDEX, TROPONINI,  in the last 168 hours BNP: BNP (last 3 results)  Recent Labs  07/18/13 0216 11/09/13 1530  PROBNP 29.7 85.6   CBG:  Recent Labs Lab 12/03/13 1226 12/03/13 1635 12/03/13 2025 12/04/13 0817 12/04/13 1159  GLUCAP 147* 144* 144* 117* 132*       Signed:  Eugenie Filler MD [facility] 12/04/2013, 1:47 PM

## 2013-12-04 NOTE — Plan of Care (Signed)
Problem: Phase II Progression Outcomes Goal: Return of bowel function (flatus, BM) IF ABDOMINAL SURGERY:  Outcome: Adequate for Discharge Passing flatus.

## 2013-12-04 NOTE — Progress Notes (Signed)
Discussed with PA  Leighton Ruff. Redmond Pulling, MD, FACS General, Bariatric, & Minimally Invasive Surgery [facility], [place]

## 2013-12-04 NOTE — Discharge Instructions (Signed)

## 2013-12-04 NOTE — Progress Notes (Signed)
Patient ID: Troy [name], male   DOB: October 28, 1965, 48 y.o.   MRN: [id number]  Subjective: Stable LFTs, tolerating a diet, passing flatus, ambulating and pain well controlled.  Stable LFTs  Objective:  Vital signs:  Filed Vitals:   12/03/13 1228 12/03/13 1631 12/03/13 2023 12/04/13 0506  BP: 167/94 141/91 146/93 166/103  Pulse: 86 90 78 89  Temp: 98 F (36.7 C) 97.5 F (36.4 C) 98.3 F (36.8 C) 98.7 F (37.1 C)  TempSrc: Oral     Resp: _0 Height:      Weight:   262 lb 9.6 oz (119.115 kg)   SpO2: 98% 100% 96% 94%    Last BM Date: 12/01/13  Intake/Output   Yesterday:  05/09 0701 - 05/10 0700 In: 2360 [P.O.:240; I.V.:2120] Out: 600 [Urine:600] This shift:    I/O last 3 completed shifts: In: 4709 [P.O.:540; I.V.:3320] Out: 900 [Urine:900]    Physical Exam: General: Pt awake/alert/oriented x4 in no acute distress Abdomen: Soft.  Nondistended.  Appropriately tender.  Incisions are c/d/i.  No evidence of peritonitis.  No incarcerated hernias.   Problem List:   Principal Problem:   Acute pancreatitis Active Problems:   Hypertension   Diabetes   Acute renal failure   Kidney stones   Cholelithiasis: PER CT ABD/PELVIS AND ABD [place] 11/30/13    Results:   Labs: Results for orders placed during the hospital encounter of 11/29/13 (from the past 48 hour(s))  GLUCOSE, CAPILLARY     Status: Abnormal   Collection Time    12/02/13 12:17 PM      Result Value Ref Range   Glucose-Capillary 124 (*) 70 - 99 mg/dL  GLUCOSE, CAPILLARY     Status: Abnormal   Collection Time    12/02/13  5:22 PM      Result Value Ref Range   Glucose-Capillary 123 (*) 70 - 99 mg/dL  GLUCOSE, CAPILLARY     Status: Abnormal   Collection Time    12/02/13  9:46 PM      Result Value Ref Range   Glucose-Capillary 175 (*) 70 - 99 mg/dL  COMPREHENSIVE METABOLIC PANEL     Status: Abnormal   Collection Time    12/03/13  7:15 AM      Result Value Ref Range   Sodium 142  137 - 147 mEq/L    Potassium 4.2  3.7 - 5.3 mEq/L   Chloride 106  96 - 112 mEq/L   CO2 22  19 - 32 mEq/L   Glucose, Bld 125 (*) 70 - 99 mg/dL   BUN 7  6 - 23 mg/dL   Creatinine, Ser 0.81  0.50 - 1.35 mg/dL   Calcium 9.0  8.4 - 10.5 mg/dL   Total Protein 8.1  6.0 - 8.3 g/dL   Albumin 3.6  3.5 - 5.2 g/dL   AST 22  0 - 37 U/L   ALT 29  0 - 53 U/L   Alkaline Phosphatase 79  39 - 117 U/L   Total Bilirubin 0.4  0.3 - 1.2 mg/dL   GFR calc non Af Amer >90  >90 mL/min   GFR calc Af Amer >90  >90 mL/min   Comment: (NOTE)     The eGFR has been calculated using the CKD EPI equation.     This calculation has not been validated in all clinical situations.     eGFR's persistently <90 mL/min signify possible Chronic Kidney     Disease.  CBC  Status: None   Collection Time    12/03/13  7:15 AM      Result Value Ref Range   WBC 6.4  4.0 - 10.5 K/uL   RBC 4.47  4.22 - 5.81 MIL/uL   Hemoglobin 13.5  13.0 - 17.0 g/dL   HCT 39.2  39.0 - 52.0 %   MCV 87.7  78.0 - 100.0 fL   MCH 30.2  26.0 - 34.0 pg   MCHC 34.4  30.0 - 36.0 g/dL   RDW 13.2  11.5 - 15.5 %   Platelets 242  150 - 400 K/uL  LIPASE, BLOOD     Status: Abnormal   Collection Time    12/03/13  7:15 AM      Result Value Ref Range   Lipase 109 (*) 11 - 59 U/L  GLUCOSE, CAPILLARY     Status: Abnormal   Collection Time    12/03/13  8:28 AM      Result Value Ref Range   Glucose-Capillary 132 (*) 70 - 99 mg/dL  GLUCOSE, CAPILLARY     Status: Abnormal   Collection Time    12/03/13 11:13 AM      Result Value Ref Range   Glucose-Capillary 158 (*) 70 - 99 mg/dL   Comment 1 Notify RN    GLUCOSE, CAPILLARY     Status: Abnormal   Collection Time    12/03/13 12:26 PM      Result Value Ref Range   Glucose-Capillary 147 (*) 70 - 99 mg/dL  GLUCOSE, CAPILLARY     Status: Abnormal   Collection Time    12/03/13  4:35 PM      Result Value Ref Range   Glucose-Capillary 144 (*) 70 - 99 mg/dL  GLUCOSE, CAPILLARY     Status: Abnormal   Collection Time     12/03/13  8:25 PM      Result Value Ref Range   Glucose-Capillary 144 (*) 70 - 99 mg/dL  COMPREHENSIVE METABOLIC PANEL     Status: Abnormal   Collection Time    12/04/13  5:20 AM      Result Value Ref Range   Sodium 145  137 - 147 mEq/L   Potassium 4.0  3.7 - 5.3 mEq/L   Chloride 107  96 - 112 mEq/L   CO2 23  19 - 32 mEq/L   Glucose, Bld 100 (*) 70 - 99 mg/dL   BUN 6  6 - 23 mg/dL   Creatinine, Ser 0.85  0.50 - 1.35 mg/dL   Calcium 8.7  8.4 - 10.5 mg/dL   Total Protein 7.0  6.0 - 8.3 g/dL   Albumin 3.2 (*) 3.5 - 5.2 g/dL   AST 59 (*) 0 - 37 U/L   ALT 53  0 - 53 U/L   Alkaline Phosphatase 69  39 - 117 U/L   Total Bilirubin 0.5  0.3 - 1.2 mg/dL   GFR calc non Af Amer >90  >90 mL/min   GFR calc Af Amer >90  >90 mL/min   Comment: (NOTE)     The eGFR has been calculated using the CKD EPI equation.     This calculation has not been validated in all clinical situations.     eGFR's persistently <90 mL/min signify possible Chronic Kidney     Disease.  LIPASE, BLOOD     Status: Abnormal   Collection Time    12/04/13  5:20 AM      Result Value Ref Range  Lipase 62 (*) 11 - 59 U/L  CBC     Status: Abnormal   Collection Time    12/04/13  5:20 AM      Result Value Ref Range   WBC 6.5  4.0 - 10.5 K/uL   RBC 4.17 (*) 4.22 - 5.81 MIL/uL   Hemoglobin 12.8 (*) 13.0 - 17.0 g/dL   HCT 36.6 (*) 39.0 - 52.0 %   MCV 87.8  78.0 - 100.0 fL   MCH 30.7  26.0 - 34.0 pg   MCHC 35.0  30.0 - 36.0 g/dL   RDW 13.3  11.5 - 15.5 %   Platelets 200  150 - 400 K/uL    Imaging / Studies: No results found.  Medications / Allergies: per chart  Antibiotics: Anti-infectives   Start     Dose/Rate Route Frequency Ordered Stop   12/03/13 0915  cefOXitin (MEFOXIN) 2 g in dextrose 5 % 50 mL IVPB     2 g 100 mL/hr over 30 Minutes Intravenous  Once 12/03/13 0902 12/03/13 0926   12/03/13 0913  dextrose 5 % with cefOXitin (MEFOXIN) ADS Med    Comments:  Storm Frisk   : cabinet override      12/03/13 0913  12/03/13 1255      Assessment/Plan POD#1 laparoscopic cholecystectomy---Dr. Donne Hazel 12/03/13 Stable for discharge from surgical standpoint. Instructions provided along with a follow up appt and a work statement. He will need an Rx for pain medication.   We discuss limiting his alcohol use.   Erby Pian, [facility] Pager [phone] Office [phone]  12/04/2013 9:15 AM

## 2013-12-04 NOTE — Progress Notes (Signed)
Patient to be discharged to home. Telemetry box #9 discontinued and returned to nurse's station. Discharge instructions and follow up appts reviewed with patient. Prescriptions given to patient. Patient ambulated in the hall prior to being discharged.  Jasmene Goswami Donnie Aho, RN

## 2013-12-05 ENCOUNTER — Encounter (HOSPITAL_COMMUNITY): Payer: Self-pay | Admitting: General Surgery

## 2013-12-27 ENCOUNTER — Encounter (INDEPENDENT_AMBULATORY_CARE_PROVIDER_SITE_OTHER): Payer: Self-pay | Admitting: *Deleted

## 2013-12-27 ENCOUNTER — Encounter (INDEPENDENT_AMBULATORY_CARE_PROVIDER_SITE_OTHER): Payer: Self-pay

## 2013-12-27 ENCOUNTER — Ambulatory Visit (INDEPENDENT_AMBULATORY_CARE_PROVIDER_SITE_OTHER): Payer: BC Managed Care – PPO | Admitting: General Surgery

## 2013-12-27 VITALS — BP 122/85 | HR 86 | Temp 98.7°F | Resp 16 | Ht 69.0 in | Wt 266.0 lb

## 2013-12-27 DIAGNOSIS — K811 Chronic cholecystitis: Secondary | ICD-10-CM

## 2013-12-27 NOTE — Progress Notes (Signed)
[name] [name] 09-19-1965 [id number] 12/27/2013   Troy [name] is a 48 y.o. male who had a laparoscopic cholecystectomy with intraoperative cholangiogram by Dr. Rolm Bookbinder.  The pathology report confirmed  Gallbladder - CHRONIC CHOLECYSTITIS. - CHOLELITHIASIS.Marland Kitchen  The patient reports that they are feeling well with normal bowel movements and good appetite.  The pre-operative symptoms of abdominal pain, nausea, and vomiting have resolved.    Physical examination - Incisions appear well-healed with no sign of infection or bleeding.   Abdomen - soft, non-tender  Impression:  s/p laparoscopic cholecystectomy  Plan:  He may resume a regular diet and full activity.  He may follow-up on a PRN basis.

## 2013-12-27 NOTE — Patient Instructions (Signed)
Call again if we can help

## 2014-02-06 ENCOUNTER — Encounter (HOSPITAL_COMMUNITY): Payer: Self-pay | Admitting: Emergency Medicine

## 2014-02-06 ENCOUNTER — Emergency Department (HOSPITAL_COMMUNITY): Payer: BC Managed Care – PPO

## 2014-02-06 ENCOUNTER — Emergency Department (HOSPITAL_COMMUNITY)
Admission: EM | Admit: 2014-02-06 | Discharge: 2014-02-06 | Disposition: A | Discharge status: Home / Self Care | Payer: BC Managed Care – PPO | Attending: Emergency Medicine | Admitting: Emergency Medicine

## 2014-02-06 DIAGNOSIS — I951 Orthostatic hypotension: Secondary | ICD-10-CM | POA: Insufficient documentation

## 2014-02-06 DIAGNOSIS — Z79899 Other long term (current) drug therapy: Secondary | ICD-10-CM | POA: Insufficient documentation

## 2014-02-06 DIAGNOSIS — R42 Dizziness and giddiness: Secondary | ICD-10-CM

## 2014-02-06 DIAGNOSIS — J45909 Unspecified asthma, uncomplicated: Secondary | ICD-10-CM | POA: Insufficient documentation

## 2014-02-06 DIAGNOSIS — I10 Essential (primary) hypertension: Secondary | ICD-10-CM | POA: Insufficient documentation

## 2014-02-06 DIAGNOSIS — L02419 Cutaneous abscess of limb, unspecified: Secondary | ICD-10-CM | POA: Insufficient documentation

## 2014-02-06 DIAGNOSIS — L03119 Cellulitis of unspecified part of limb: Secondary | ICD-10-CM

## 2014-02-06 DIAGNOSIS — Z87442 Personal history of urinary calculi: Secondary | ICD-10-CM | POA: Insufficient documentation

## 2014-02-06 DIAGNOSIS — E860 Dehydration: Secondary | ICD-10-CM | POA: Insufficient documentation

## 2014-02-06 DIAGNOSIS — E119 Type 2 diabetes mellitus without complications: Secondary | ICD-10-CM | POA: Insufficient documentation

## 2014-02-06 DIAGNOSIS — L03115 Cellulitis of right lower limb: Secondary | ICD-10-CM

## 2014-02-06 LAB — CBC
HEMATOCRIT: 36.8 % — AB (ref 39.0–52.0)
Hemoglobin: 12.3 g/dL — ABNORMAL LOW (ref 13.0–17.0)
MCH: 29.1 pg (ref 26.0–34.0)
MCHC: 33.4 g/dL (ref 30.0–36.0)
MCV: 87 fL (ref 78.0–100.0)
Platelets: 227 10*3/uL (ref 150–400)
RBC: 4.23 MIL/uL (ref 4.22–5.81)
RDW: 13.9 % (ref 11.5–15.5)
WBC: 9.2 10*3/uL (ref 4.0–10.5)

## 2014-02-06 LAB — BASIC METABOLIC PANEL
ANION GAP: 15 (ref 5–15)
BUN: 34 mg/dL — AB (ref 6–23)
CHLORIDE: 105 meq/L (ref 96–112)
CO2: 21 meq/L (ref 19–32)
CREATININE: 1.48 mg/dL — AB (ref 0.50–1.35)
Calcium: 9.1 mg/dL (ref 8.4–10.5)
GFR calc Af Amer: 62 mL/min — ABNORMAL LOW (ref >90)
GFR calc non Af Amer: 53 mL/min — ABNORMAL LOW (ref >90)
Glucose, Bld: 123 mg/dL — ABNORMAL HIGH (ref 70–99)
POTASSIUM: 5 meq/L (ref 3.7–5.3)
Sodium: 141 mEq/L (ref 137–147)

## 2014-02-06 LAB — I-STAT CG4 LACTIC ACID, ED: Lactic Acid, Venous: 1.18 mmol/L (ref 0.5–2.2)

## 2014-02-06 LAB — I-STAT TROPONIN, ED: TROPONIN I, POC: 0.01 ng/mL (ref 0.00–0.08)

## 2014-02-06 MED ORDER — SULFAMETHOXAZOLE-TMP DS 800-160 MG PO TABS: 1.0000 | ORAL_TABLET | Freq: Once | ORAL | Status: DC

## 2014-02-06 MED ORDER — CEPHALEXIN 250 MG PO CAPS
500.0000 mg | ORAL_CAPSULE | Freq: Once | ORAL | Status: AC
  Administered 2014-02-06: 500 mg via ORAL
  Filled 2014-02-06: qty 2

## 2014-02-06 MED ORDER — SODIUM CHLORIDE 0.9 % IV BOLUS (SEPSIS)
1000.0000 mL | Freq: Once | INTRAVENOUS | Status: AC
  Administered 2014-02-06: 1000 mL via INTRAVENOUS

## 2014-02-06 MED ORDER — SULFAMETHOXAZOLE-TMP DS 800-160 MG PO TABS
1.0000 | ORAL_TABLET | Freq: Once | ORAL | Status: AC
  Administered 2014-02-06: 1 via ORAL
  Filled 2014-02-06: qty 1

## 2014-02-06 MED ORDER — CEPHALEXIN 500 MG PO CAPS: 500.0000 mg | ORAL_CAPSULE | Freq: Three times a day (TID) | ORAL | Status: DC

## 2014-02-06 NOTE — Discharge Instructions (Signed)
Cellulitis Cellulitis is an infection of the skin and the tissue beneath it. The infected area is usually red and tender. Cellulitis occurs most often in the arms and lower legs.  CAUSES  Cellulitis is caused by bacteria that enter the skin through cracks or cuts in the skin. The most common types of bacteria that cause cellulitis are Staphylococcus and Streptococcus. SYMPTOMS   Redness and warmth.  Swelling.  Tenderness or pain.  Fever. DIAGNOSIS  Your caregiver can usually determine what is wrong based on a physical exam. Blood tests may also be done. TREATMENT  Treatment usually involves taking an antibiotic medicine. HOME CARE INSTRUCTIONS   Take your antibiotics as directed. Finish them even if you start to feel better.  Keep the infected arm or leg elevated to reduce swelling.  Apply a warm cloth to the affected area up to 4 times per day to relieve pain.  Only take over-the-counter or prescription medicines for pain, discomfort, or fever as directed by your caregiver.  Keep all follow-up appointments as directed by your caregiver. SEEK MEDICAL CARE IF:   You notice red streaks coming from the infected area.  Your red area gets larger or turns dark in color.  Your bone or joint underneath the infected area becomes painful after the skin has healed.  Your infection returns in the same area or another area.  You notice a swollen bump in the infected area.  You develop new symptoms. SEEK IMMEDIATE MEDICAL CARE IF:   You have a fever.  You feel very sleepy.  You develop vomiting or diarrhea.  You have a general ill feeling (malaise) with muscle aches and pains. MAKE SURE YOU:   Understand these instructions.  Will watch your condition.  Will get help right away if you are not doing well or get worse. Document Released: 04/23/2005 Document Revised: 01/13/2012 Document Reviewed: 09/29/2011 [facility] Patient Information 2015 [place], [place]. This information is  not intended to replace advice given to you by your health care provider. Make sure you discuss any questions you have with your health care provider.  Dehydration, Adult Dehydration is when you lose more fluids from the body than you take in. Vital organs like the kidneys, brain, and heart cannot function without a proper amount of fluids and salt. Any loss of fluids from the body can cause dehydration.  CAUSES   Vomiting.  Diarrhea.  Excessive sweating.  Excessive urine output.  Fever. SYMPTOMS  Mild dehydration  Thirst.  Dry lips.  Slightly dry mouth. Moderate dehydration  Very dry mouth.  Sunken eyes.  Skin does not bounce back quickly when lightly pinched and released.  Dark urine and decreased urine production.  Decreased tear production.  Headache. Severe dehydration  Very dry mouth.  Extreme thirst.  Rapid, weak pulse (more than 100 beats per minute at rest).  Cold hands and feet.  Not able to sweat in spite of heat and temperature.  Rapid breathing.  Blue lips.  Confusion and lethargy.  Difficulty being awakened.  Minimal urine production.  No tears. DIAGNOSIS  Your caregiver will diagnose dehydration based on your symptoms and your exam. Blood and urine tests will help confirm the diagnosis. The diagnostic evaluation should also identify the cause of dehydration. TREATMENT  Treatment of mild or moderate dehydration can often be done at home by increasing the amount of fluids that you drink. It is best to drink small amounts of fluid more often. Drinking too much at one time can make vomiting  worse. Refer to the home care instructions below. Severe dehydration needs to be treated at the hospital where you will probably be given intravenous (IV) fluids that contain water and electrolytes. HOME CARE INSTRUCTIONS   Ask your caregiver about specific rehydration instructions.  Drink enough fluids to keep your urine clear or pale yellow.  Drink  small amounts frequently if you have nausea and vomiting.  Eat as you normally do.  Avoid:  Foods or drinks high in sugar.  Carbonated drinks.  Juice.  Extremely hot or cold fluids.  Drinks with caffeine.  Fatty, greasy foods.  Alcohol.  Tobacco.  Overeating.  Gelatin desserts.  Wash your hands well to avoid spreading bacteria and viruses.  Only take over-the-counter or prescription medicines for pain, discomfort, or fever as directed by your caregiver.  Ask your caregiver if you should continue all prescribed and over-the-counter medicines.  Keep all follow-up appointments with your caregiver. SEEK MEDICAL CARE IF:  You have abdominal pain and it increases or stays in one area (localizes).  You have a rash, stiff neck, or severe headache.  You are irritable, sleepy, or difficult to awaken.  You are weak, dizzy, or extremely thirsty. SEEK IMMEDIATE MEDICAL CARE IF:   You are unable to keep fluids down or you get worse despite treatment.  You have frequent episodes of vomiting or diarrhea.  You have blood or green matter (bile) in your vomit.  You have blood in your stool or your stool looks black and tarry.  You have not urinated in 6 to 8 hours, or you have only urinated a small amount of very dark urine.  You have a fever.  You faint. MAKE SURE YOU:   Understand these instructions.  Will watch your condition.  Will get help right away if you are not doing well or get worse. Document Released: 07/14/2005 Document Revised: 10/06/2011 Document Reviewed: 03/03/2011 [facility] Patient Information 2015 [place], [place]. This information is not intended to replace advice given to you by your health care provider. Make sure you discuss any questions you have with your health care provider.

## 2014-02-06 NOTE — ED Notes (Signed)
I Stat Lactic Acid results shown to Dr. Julious Oka

## 2014-02-06 NOTE — ED Notes (Signed)
Per ems pt at work c/o lightheadedness and dizziness. Pt diaphoretic on scene. Pt positive for orthostatic vitals. Pt lying 108/70 sitting BP 90/60 pt denies pain in NAD

## 2014-02-06 NOTE — ED Provider Notes (Signed)
CSN: [id number]     Arrival date & time 02/06/14  1758 History   First MD Initiated Contact with Patient 02/06/14 1808     No chief complaint on file.    (Consider location/radiation/quality/duration/timing/severity/associated sxs/prior Treatment) Patient is a 48 y.o. male presenting with dizziness. The history is provided by the patient.  Dizziness Quality:  Lightheadedness Severity:  Moderate Onset quality:  Sudden Timing:  Constant Progression:  Improving Chronicity:  New Context: standing up (had been standing at work)   Relieved by: sitting down. Worsened by:  Standing up Associated symptoms: no blood in stool, no chest pain, no diarrhea, no nausea, no shortness of breath, no syncope and no vomiting     Past Medical History  Diagnosis Date  . Hypertension   . Diabetes mellitus   . Gout   . Bronchitis   . Kidney stones 11/30/2013  . Asthma   . Neuromuscular disorder    Past Surgical History  Procedure Laterality Date  . No past surgeries    . Cholecystectomy N/A 12/03/2013    Procedure: LAPAROSCOPIC CHOLECYSTECTOMY WITH INTRAOPERATIVE CHOLANGIOGRAM;  Surgeon: Rolm Bookbinder, MD;  Location: [facility] OR;  Service: General;  Laterality: N/A;   Family History  Problem Relation Age of Onset  . Diabetes Mellitus II Father   . CAD Neg Hx    History  Substance Use Topics  . Smoking status: Never Smoker   . Smokeless tobacco: Not on file  . Alcohol Use: Yes     Comment: occasionally    Review of Systems  Constitutional: Negative for fever.  Respiratory: Negative for cough and shortness of breath.   Cardiovascular: Negative for chest pain and syncope.  Gastrointestinal: Negative for nausea, vomiting, abdominal pain, diarrhea and blood in stool.  Neurological: Positive for dizziness.  All other systems reviewed and are negative.     Allergies  Review of patient's allergies indicates no known allergies.  Home Medications   Prior to Admission medications     Medication Sig Start Date End Date Taking? Authorizing Provider  albuterol (PROVENTIL HFA;VENTOLIN HFA) 108 (90 BASE) MCG/ACT inhaler Inhale 2 puffs into the lungs every 6 (six) hours as needed for wheezing or shortness of breath. 07/20/13  Yes Kinnie Feil, MD  cetirizine (ZYRTEC) 10 MG tablet Take 10 mg by mouth daily.   Yes Historical Provider, MD  gabapentin (NEURONTIN) 300 MG capsule Take 300 mg by mouth 3 (three) times daily. 11/03/13  Yes Historical Provider, MD  glimepiride (AMARYL) 4 MG tablet Take 4 mg by mouth daily with breakfast.   Yes Historical Provider, MD  ipratropium-albuterol (DUONEB) 0.5-2.5 (3) MG/3ML SOLN Take 3 mLs by nebulization every 6 (six) hours as needed. Wheezing 11/10/13  Yes Verlee Monte, MD  lisinopril-hydrochlorothiazide (PRINZIDE,ZESTORETIC) 20-25 MG per tablet Take 1 tablet by mouth daily. 04/19/12  Yes Harden Mo, MD  metFORMIN (GLUCOPHAGE) 1000 MG tablet Take 1,000 mg by mouth 2 (two) times daily with a meal.   Yes Historical Provider, MD   BP 106/64  Pulse 92  Temp(Src) 98.9 F (37.2 C) (Oral)  Resp 13  Ht 5\' 10"  (1.778 m)  Wt 260 lb (117.935 kg)  BMI 37.31 kg/m2  SpO2 99% Physical Exam  Nursing note and vitals reviewed. Constitutional: He is oriented to person, place, and time. He appears well-developed and well-nourished. No distress.  HENT:  Head: Normocephalic and atraumatic.  Mouth/Throat: Oropharynx is clear and moist. No oropharyngeal exudate.  Eyes: EOM are normal. Pupils are equal, round, and  reactive to light.  Neck: Normal range of motion. Neck supple.  Cardiovascular: Normal rate and regular rhythm.  Exam reveals no friction rub.   No murmur heard. Pulmonary/Chest: Effort normal and breath sounds normal. No respiratory distress. He has no wheezes. He has no rales.  Abdominal: Soft. He exhibits no distension. There is no tenderness. There is no rebound.  Musculoskeletal: Normal range of motion. He exhibits no edema.  Neurological:  He is alert and oriented to person, place, and time.  Skin: No rash noted. He is not diaphoretic.       ED Course  Procedures (including critical care time) Labs Review Labs Reviewed  CBC  BASIC METABOLIC PANEL  I-STAT CG4 LACTIC ACID, ED  I-STAT TROPOININ, ED    Imaging Review No results found.   EKG Interpretation None      MDM   Final diagnoses:  Dehydration  Dizziness  Cellulitis of right lower extremity    48 year old male presents with dizziness. Began at work. Began when he was standing, felt lightheaded. Better with sitting down. No near-syncope. No chest pain or shortness of breath. History of hypertension and diabetes. EKG okay.  Labs show mild renal insufficiency. He is feeling better after fluids, likely all mild dehydration.  R inner posterior thigh with some cellulitis. No fluctuance, no need for I&D. Will place on Bactrim and Keflex.  Osvaldo Shipper, MD 02/07/14 [phone]

## 2014-02-08 ENCOUNTER — Encounter (INDEPENDENT_AMBULATORY_CARE_PROVIDER_SITE_OTHER): Payer: Self-pay | Admitting: Surgery

## 2014-02-08 ENCOUNTER — Ambulatory Visit (INDEPENDENT_AMBULATORY_CARE_PROVIDER_SITE_OTHER): Payer: BLUE CROSS/BLUE SHIELD | Admitting: Surgery

## 2014-02-08 VITALS — BP 118/76 | HR 82 | Temp 98.4°F | Ht 69.0 in | Wt 266.0 lb

## 2014-02-08 DIAGNOSIS — L03119 Cellulitis of unspecified part of limb: Secondary | ICD-10-CM

## 2014-02-08 DIAGNOSIS — L02419 Cutaneous abscess of limb, unspecified: Secondary | ICD-10-CM

## 2014-02-08 NOTE — Progress Notes (Signed)
URGENT Office Troy [name] 48 y.o.  Body mass index is 39.26 kg/(m^2). Patient referred by Dr. Maudry Mayhew regarding an abscess on the inner aspect of his right thigh.  He is currently being treated with Bactrim. He does have underlying diabetes. He denies any fevers or chills. The leg itself actually feels better and is not sore except in the standing.  On examination there is about a half dollar sized area of epidermal exfoliation consistent with a healing abscess. There is some induration in the underlying skin but it is nontender and no fluctuance is noted.  There is old scab in this again indicating that this is more chronic. Over on the left thigh there is an previous abscess that is healing and then there is some excrescence over his patella on the left side. He did not indicate that this has been worked up for. Perhaps this would be best examined with x-ray and an orthopedic consult.  Patient Active Problem List   Diagnosis Date Noted  . Kidney stones 11/30/2013  . Cholelithiasis: PER CT ABD/PELVIS AND ABD [place] 11/30/13 11/30/2013  . Acute renal failure 11/29/2013  . Acute pancreatitis 11/29/2013  . Asthma exacerbation 11/09/2013  . CAP (community acquired pneumonia) 07/18/2013  . Diabetes 07/18/2013  . Hypertension     No Known Allergies  Past Surgical History  Procedure Laterality Date  . No past surgeries    . Cholecystectomy N/A 12/03/2013    Procedure: LAPAROSCOPIC CHOLECYSTECTOMY WITH INTRAOPERATIVE CHOLANGIOGRAM;  Surgeon: Rolm Bookbinder, MD;  Location: [place];  Service: General;  Laterality: N/A;   Kandice Hams, MD No diagnosis found.  Recommend continue Bactrim antibiotics as prescribed. Would use Hibiclens to wash genitalia and legs. Needs an orthopedic consult to look at the large mass that appears to be contiguous with his left patella Matt B. Hassell Done, MD, [facility] [phone] beeper [phone]  02/08/2014 4:42 PM

## 2014-02-08 NOTE — Patient Instructions (Signed)
Get Hibiclens over the counter and use to wash genitalia and legs twice a day.

## 2014-02-13 ENCOUNTER — Emergency Department (HOSPITAL_COMMUNITY)
Admission: EM | Admit: 2014-02-13 | Discharge: 2014-02-13 | Disposition: A | Discharge status: Home / Self Care | Payer: BC Managed Care – PPO | Source: Home / Self Care | Attending: Emergency Medicine | Admitting: Emergency Medicine

## 2014-02-13 ENCOUNTER — Encounter (HOSPITAL_COMMUNITY): Payer: Self-pay | Admitting: Emergency Medicine

## 2014-02-13 DIAGNOSIS — M10032 Idiopathic gout, left wrist: Secondary | ICD-10-CM

## 2014-02-13 DIAGNOSIS — M109 Gout, unspecified: Secondary | ICD-10-CM

## 2014-02-13 LAB — CBC WITH DIFFERENTIAL/PLATELET
BASOS ABS: 0 10*3/uL (ref 0.0–0.1)
Basophils Relative: 0 % (ref 0–1)
Eosinophils Absolute: 0.5 10*3/uL (ref 0.0–0.7)
Eosinophils Relative: 4 % (ref 0–5)
HEMATOCRIT: 37.4 % — AB (ref 39.0–52.0)
Hemoglobin: 12.7 g/dL — ABNORMAL LOW (ref 13.0–17.0)
LYMPHS PCT: 29 % (ref 12–46)
Lymphs Abs: 3.3 10*3/uL (ref 0.7–4.0)
MCH: 29.5 pg (ref 26.0–34.0)
MCHC: 34 g/dL (ref 30.0–36.0)
MCV: 86.8 fL (ref 78.0–100.0)
MONO ABS: 0.9 10*3/uL (ref 0.1–1.0)
Monocytes Relative: 8 % (ref 3–12)
NEUTROS ABS: 6.9 10*3/uL (ref 1.7–7.7)
Neutrophils Relative %: 59 % (ref 43–77)
Platelets: 263 10*3/uL (ref 150–400)
RBC: 4.31 MIL/uL (ref 4.22–5.81)
RDW: 13.9 % (ref 11.5–15.5)
WBC: 11.6 10*3/uL — AB (ref 4.0–10.5)

## 2014-02-13 MED ORDER — MELOXICAM 7.5 MG PO TABS: 7.5000 mg | ORAL_TABLET | Freq: Every day | ORAL | Status: DC | PRN

## 2014-02-13 MED ORDER — COLCHICINE 0.6 MG PO TABS: ORAL_TABLET | ORAL | Status: DC

## 2014-02-13 NOTE — ED Provider Notes (Addendum)
CSN: [id number]     Arrival date & time 02/13/14  1020 History   First MD Initiated Contact with Patient 02/13/14 1052     Chief Complaint  Patient presents with  . Arm Swelling   (Consider location/radiation/quality/duration/timing/severity/associated sxs/prior Treatment) HPI He is here today for pain and swelling in his left forearm. He states this started 2 days ago. At that time he noted pain and swelling from his wrist to his elbow. Since Saturday, he states the pain and swelling has improved some. At this time, the pain and swelling is limited to the radial wrist. It is worse when he tries to make a fist or move his wrist. He states he has had fevers, however that these were related to an abscess he had in his right thigh. He is currently on Bactrim for the right thigh abscess.  He has a history of gout. He has had gout in the wrist previously, and states this pain is similar. He has previously been on a gout medication, but not in the last year.  Past Medical History  Diagnosis Date  . Hypertension   . Diabetes mellitus   . Gout   . Bronchitis   . Kidney stones 11/30/2013  . Asthma   . Neuromuscular disorder    Past Surgical History  Procedure Laterality Date  . No past surgeries    . Cholecystectomy N/A 12/03/2013    Procedure: LAPAROSCOPIC CHOLECYSTECTOMY WITH INTRAOPERATIVE CHOLANGIOGRAM;  Surgeon: Rolm Bookbinder, MD;  Location: [facility] OR;  Service: General;  Laterality: N/A;   Family History  Problem Relation Age of Onset  . Diabetes Mellitus II Father   . CAD Neg Hx    History  Substance Use Topics  . Smoking status: Never Smoker   . Smokeless tobacco: Not on file  . Alcohol Use: Yes     Comment: occasionally    Review of Systems  Constitutional: Positive for fever.  HENT: Negative.   Eyes: Negative.   Respiratory: Negative.   Cardiovascular: Negative.   Gastrointestinal: Negative.   Genitourinary: Negative.   Musculoskeletal:       Left wrist pain and  swelling    Allergies  Review of patient's allergies indicates no known allergies.  Home Medications   Prior to Admission medications   Medication Sig Start Date End Date Taking? Authorizing Provider  albuterol (PROVENTIL HFA;VENTOLIN HFA) 108 (90 BASE) MCG/ACT inhaler Inhale 2 puffs into the lungs every 6 (six) hours as needed for wheezing or shortness of breath. 07/20/13   Kinnie Feil, MD  cetirizine (ZYRTEC) 10 MG tablet Take 10 mg by mouth daily.    Historical Provider, MD  gabapentin (NEURONTIN) 300 MG capsule Take 300 mg by mouth 3 (three) times daily. 11/03/13   Historical Provider, MD  glimepiride (AMARYL) 4 MG tablet Take 4 mg by mouth daily with breakfast.    Historical Provider, MD  ipratropium-albuterol (DUONEB) 0.5-2.5 (3) MG/3ML SOLN Take 3 mLs by nebulization every 6 (six) hours as needed. Wheezing 11/10/13   Verlee Monte, MD  lisinopril-hydrochlorothiazide (PRINZIDE,ZESTORETIC) 20-25 MG per tablet Take 1 tablet by mouth daily. 04/19/12   Harden Mo, MD  metFORMIN (GLUCOPHAGE) 1000 MG tablet Take 1,000 mg by mouth 2 (two) times daily with a meal.    Historical Provider, MD  sulfamethoxazole-trimethoprim (BACTRIM DS) 800-160 MG per tablet Take 1 tablet by mouth once. 02/06/14   Osvaldo Shipper, MD   BP 138/112  Pulse 69  Temp(Src) 99.4 F (37.4 C) (Oral)  Resp 16  SpO2 100% Physical Exam  Constitutional: He appears well-developed and well-nourished. No distress.  HENT:  Head: Normocephalic and atraumatic.  Neck: Normal range of motion.  Cardiovascular: Normal rate.   Pulmonary/Chest: Effort normal.  Musculoskeletal:       Left wrist: He exhibits decreased range of motion (secondary to pain), tenderness (mild tenderness to palpation) and swelling. He exhibits no bony tenderness, no crepitus, no deformity and no laceration.  Left radial wrist is warm, swollen and erythematous.  Able to move fingers well, brisk cap refill in digits.    Skin: He is not  diaphoretic.    ED Course  Procedures (including critical care time) Labs Review Labs Reviewed - No data to display  Imaging Review No results found.   MDM  No diagnosis found. History and exam most consistent with gout flare. I am less concerned for joint infection given the subjective improvement in the last 48 hours. White count is mildly elevated at 11.6 with normal diff. He will continue the Bactrim for his abscess. Provided prescription and instructions for colchicine and meloxicam. Followup with primary doctor within the next week. Return if develops fevers, worsening redness, worsening pain.  Blood pressure is mildly elevated today. He is normally on lisinopril/HCTZ, however this was stopped last week due to low blood pressures. He is planning on discussing restarting this medication with his PCP this week.  Melony Overly, MD 02/13/14 [place], MD 02/13/14 [phone]

## 2014-02-13 NOTE — Discharge Instructions (Signed)
Gout  Gout is when your joints become red, sore, and swell (inflammed). This is caused by the buildup of uric acid crystals in the joints. Uric acid is a chemical that is normally in the blood. If the level of uric acid gets too high in the blood, these crystals form in your joints and tissues. Over time, these crystals can form into masses near the joints and tissues. These masses can destroy bone and cause the bone to look misshapen (deformed).  HOME CARE   · Do not take aspirin for pain.  · Only take medicine as told by your doctor.  · Rest the joint as much as you can. When in bed, keep sheets and blankets off painful areas.  · Keep the sore joints raised (elevated).  · Put warm or cold packs on painful joints. Use of warm or cold packs depends on which works best for you.  · Use crutches if the painful joint is in your leg.  · Drink enough fluids to keep your pee (urine) clear or pale yellow. Limit alcohol, sugary drinks, and drinks with fructose in them.  · Follow your diet instructions. Pay careful attention to how much protein you eat. Include fruits, vegetables, whole grains, and fat-free or low-fat milk products in your daily diet. Talk to your doctor or dietician about the use of coffee, vitamin C, and cherries. These may help lower uric acid levels.  · Keep a healthy body weight.  GET HELP RIGHT AWAY IF:   · You have watery poop (diarrhea), throw up (vomit), or have any side effects from medicines.  · You do not feel better in 24 hours, or you are getting worse.  · Your joint becomes suddenly more tender, and you have chills or a fever.  MAKE SURE YOU:   · Understand these instructions.  · Will watch your condition.  · Will get help right away if you are not doing well or get worse.  Document Released: 04/22/2008 Document Revised: 11/08/2012 Document Reviewed: 10/22/2009  Exit[employer]® Patient Information ©2015 [employer]. This information is not intended to replace advice given to you by your health care  provider. Make sure you discuss any questions you have with your health care provider.

## 2014-02-13 NOTE — ED Notes (Signed)
Pt  Has  Pain/  Swelling  Of  The  l  Arm -  Elbow/  Wrist  Area     X 3   Days  -  denys  Any  Injury    - denys  Any  Known   isect  Bites   -     History of gout in the  Past    Stopped  His  BP meds  Recently   Because  Last week  His  pressiure  Was  Low

## 2014-04-17 ENCOUNTER — Other Ambulatory Visit: Payer: Self-pay | Admitting: Nurse Practitioner

## 2014-04-17 ENCOUNTER — Ambulatory Visit
Admission: RE | Admit: 2014-04-17 | Discharge: 2014-04-17 | Disposition: A | Discharge status: Home / Self Care | Payer: BC Managed Care – PPO | Source: Ambulatory Visit | Attending: Nurse Practitioner | Admitting: Nurse Practitioner

## 2014-04-17 DIAGNOSIS — J40 Bronchitis, not specified as acute or chronic: Secondary | ICD-10-CM

## 2014-07-02 ENCOUNTER — Emergency Department (HOSPITAL_COMMUNITY)
Admission: EM | Admit: 2014-07-02 | Discharge: 2014-07-03 | Disposition: A | Discharge status: Home / Self Care | Payer: Worker's Compensation | Attending: Emergency Medicine | Admitting: Emergency Medicine

## 2014-07-02 ENCOUNTER — Other Ambulatory Visit: Payer: Self-pay

## 2014-07-02 ENCOUNTER — Emergency Department (HOSPITAL_COMMUNITY): Payer: Worker's Compensation

## 2014-07-02 ENCOUNTER — Encounter (HOSPITAL_COMMUNITY): Payer: Self-pay | Admitting: *Deleted

## 2014-07-02 DIAGNOSIS — Z7982 Long term (current) use of aspirin: Secondary | ICD-10-CM | POA: Insufficient documentation

## 2014-07-02 DIAGNOSIS — S20212A Contusion of left front wall of thorax, initial encounter: Secondary | ICD-10-CM | POA: Diagnosis not present

## 2014-07-02 DIAGNOSIS — W01198A Fall on same level from slipping, tripping and stumbling with subsequent striking against other object, initial encounter: Secondary | ICD-10-CM | POA: Insufficient documentation

## 2014-07-02 DIAGNOSIS — Y9289 Other specified places as the place of occurrence of the external cause: Secondary | ICD-10-CM | POA: Diagnosis not present

## 2014-07-02 DIAGNOSIS — Z791 Long term (current) use of non-steroidal anti-inflammatories (NSAID): Secondary | ICD-10-CM | POA: Diagnosis not present

## 2014-07-02 DIAGNOSIS — Y998 Other external cause status: Secondary | ICD-10-CM | POA: Diagnosis not present

## 2014-07-02 DIAGNOSIS — E119 Type 2 diabetes mellitus without complications: Secondary | ICD-10-CM | POA: Insufficient documentation

## 2014-07-02 DIAGNOSIS — I10 Essential (primary) hypertension: Secondary | ICD-10-CM | POA: Insufficient documentation

## 2014-07-02 DIAGNOSIS — E669 Obesity, unspecified: Secondary | ICD-10-CM | POA: Insufficient documentation

## 2014-07-02 DIAGNOSIS — W19XXXA Unspecified fall, initial encounter: Secondary | ICD-10-CM

## 2014-07-02 DIAGNOSIS — Z79899 Other long term (current) drug therapy: Secondary | ICD-10-CM | POA: Insufficient documentation

## 2014-07-02 DIAGNOSIS — J45909 Unspecified asthma, uncomplicated: Secondary | ICD-10-CM | POA: Diagnosis not present

## 2014-07-02 DIAGNOSIS — Y9389 Activity, other specified: Secondary | ICD-10-CM | POA: Diagnosis not present

## 2014-07-02 DIAGNOSIS — Z87442 Personal history of urinary calculi: Secondary | ICD-10-CM | POA: Diagnosis not present

## 2014-07-02 DIAGNOSIS — M109 Gout, unspecified: Secondary | ICD-10-CM | POA: Insufficient documentation

## 2014-07-02 HISTORY — DX: Obesity, unspecified: E66.9

## 2014-07-02 MED ORDER — DIAZEPAM 5 MG PO TABS
5.0000 mg | ORAL_TABLET | Freq: Once | ORAL | Status: AC
  Administered 2014-07-02: 5 mg via ORAL
  Filled 2014-07-02: qty 1

## 2014-07-02 MED ORDER — ONDANSETRON 4 MG PO TBDP
4.0000 mg | ORAL_TABLET | Freq: Once | ORAL | Status: AC
  Administered 2014-07-02: 4 mg via ORAL
  Filled 2014-07-02: qty 1

## 2014-07-02 MED ORDER — IBUPROFEN 200 MG PO TABS
600.0000 mg | ORAL_TABLET | Freq: Once | ORAL | Status: AC
  Administered 2014-07-02: 600 mg via ORAL
  Filled 2014-07-02: qty 3

## 2014-07-02 MED ORDER — CYCLOBENZAPRINE HCL 10 MG PO TABS: 10.0000 mg | ORAL_TABLET | Freq: Two times a day (BID) | ORAL | Status: DC | PRN

## 2014-07-02 MED ORDER — HYDROCODONE-ACETAMINOPHEN 5-325 MG PO TABS: 1.0000 | ORAL_TABLET | ORAL | Status: DC | PRN

## 2014-07-02 MED ORDER — IBUPROFEN 600 MG PO TABS: 600.0000 mg | ORAL_TABLET | Freq: Four times a day (QID) | ORAL | Status: DC | PRN

## 2014-07-02 NOTE — Discharge Instructions (Signed)
Rib Contusion °A rib contusion (bruise) can occur by a blow to the chest or by a fall against a hard object. Usually these will be much better in a couple weeks. If X-rays were taken today and there are no broken bones (fractures), the diagnosis of bruising is made. However, broken ribs may not show up for several days, or may be discovered later on a routine X-ray when signs of healing show up. If this happens to you, it does not mean that something was missed on the X-ray, but simply that it did not show up on the first X-rays. Earlier diagnosis will not usually change the treatment. °HOME CARE INSTRUCTIONS  °· Avoid strenuous activity. Be careful during activities and avoid bumping the injured ribs. Activities that pull on the injured ribs and cause pain should be avoided, if possible. °· For the first day or two, an ice pack used every 20 minutes while awake may be helpful. Put ice in a plastic bag and put a towel between the bag and the skin. °· Eat a normal, well-balanced diet. Drink plenty of fluids to avoid constipation. °· Take deep breaths several times a day to keep lungs free of infection. Try to cough several times a day. Splint the injured area with a pillow while coughing to ease pain. Coughing can help prevent pneumonia. °· Wear a rib belt or binder only if told to do so by your caregiver. If you are wearing a rib belt or binder, you must do the breathing exercises as directed by your caregiver. If not used properly, rib belts or binders restrict breathing which can lead to pneumonia. °· Only take over-the-counter or prescription medicines for pain, discomfort, or fever as directed by your caregiver. °SEEK MEDICAL CARE IF:  °· You or your child has an oral temperature above 102° F (38.9° C). °· Your baby is older than 3 months with a rectal temperature of 100.5° F (38.1° C) or higher for more than 1 day. °· You develop a cough, with thick or bloody sputum. °SEEK IMMEDIATE MEDICAL CARE IF:  °· You  have difficulty breathing. °· You feel sick to your stomach (nausea), have vomiting or belly (abdominal) pain. °· You have worsening pain, not controlled with medications, or there is a change in the location of the pain. °· You develop sweating or radiation of the pain into the arms, jaw or shoulders, or become light headed or faint. °· You or your child has an oral temperature above 102° F (38.9° C), not controlled by medicine. °· Your or your baby is older than 3 months with a rectal temperature of 102° F (38.9° C) or higher. °· Your baby is 3 months old or younger with a rectal temperature of 100.4° F (38° C) or higher. °MAKE SURE YOU:  °· Understand these instructions. °· Will watch your condition. °· Will get help right away if you are not doing well or get worse. °Document Released: 04/08/2001 Document Revised: 11/08/2012 Document Reviewed: 03/01/2008 °[employer]® Patient Information ©2015 [employer]. This information is not intended to replace advice given to you by your health care provider. Make sure you discuss any questions you have with your health care provider. ° °

## 2014-07-02 NOTE — ED Notes (Signed)
Pt given instruction on use of incentive spirametry, pt demonstrated use of device with teach-back.

## 2014-07-02 NOTE — ED Notes (Signed)
Pt reports tripping and falling at work and landed on pallets, now has pain to sternum and into mid back, pain increases with movement and breathing. Ambulatory at triage. Airway intact.

## 2014-07-02 NOTE — ED Provider Notes (Signed)
CSN: [id number]     Arrival date & time 07/02/14  1829 History   First MD Initiated Contact with Patient 07/02/14 2050     Chief Complaint  Patient presents with  . Fall     (Consider location/radiation/quality/duration/timing/severity/associated sxs/prior Treatment) Patient is a 48 y.o. male presenting with chest pain.  Chest Pain Pain location:  L chest Pain quality: sharp and shooting   Pain radiates to:  Does not radiate Pain radiates to the back: no   Pain severity:  Moderate Onset quality:  Sudden Duration:  2 hours Timing:  Constant Progression:  Unchanged Chronicity:  New Context: trauma   Relieved by:  Rest Worsened by:  Movement, deep breathing and coughing Ineffective treatments:  None tried Associated symptoms: back pain   Associated symptoms: no abdominal pain, no cough, no fatigue, no headache, no nausea, no numbness, no syncope, not vomiting and no weakness   Risk factors: no prior DVT/PE      Patient to the ER with complaints of fall. He was at work and accidentally tripped over something and fell backwards landing on his left side. He did not hit his head, no loc. He reports that at rest he has no pain. He presents to get "xrays to get his ribs looked at"    Past Medical History  Diagnosis Date  . Hypertension   . Diabetes mellitus   . Gout   . Bronchitis   . Kidney stones 11/30/2013  . Asthma   . Neuromuscular disorder   . Obesity    Past Surgical History  Procedure Laterality Date  . No past surgeries    . Cholecystectomy N/A 12/03/2013    Procedure: LAPAROSCOPIC CHOLECYSTECTOMY WITH INTRAOPERATIVE CHOLANGIOGRAM;  Surgeon: Rolm Bookbinder, MD;  Location: [facility] OR;  Service: General;  Laterality: N/A;   Family History  Problem Relation Age of Onset  . Diabetes Mellitus II Father   . CAD Neg Hx    History  Substance Use Topics  . Smoking status: Never Smoker   . Smokeless tobacco: Not on file  . Alcohol Use: Yes     Comment: occasionally     Review of Systems  Constitutional: Negative for fatigue.  Respiratory: Negative for cough.   Cardiovascular: Positive for chest pain. Negative for syncope.  Gastrointestinal: Negative for nausea, vomiting and abdominal pain.  Musculoskeletal: Positive for back pain.  Neurological: Negative for weakness, numbness and headaches.    10 Systems reviewed and are negative for acute change except as noted in the HPI.    Allergies  Review of patient's allergies indicates no known allergies.  Home Medications   Prior to Admission medications   Medication Sig Start Date End Date Taking? Authorizing Provider  ADVAIR DISKUS 250-50 MCG/DOSE AEPB Inhale 1 puff into the lungs 2 (two) times daily. 04/20/14  Yes Historical Provider, MD  albuterol (PROVENTIL HFA;VENTOLIN HFA) 108 (90 BASE) MCG/ACT inhaler Inhale 2 puffs into the lungs every 6 (six) hours as needed for wheezing or shortness of breath. 07/20/13  Yes Kinnie Feil, MD  aspirin (ASPIRIN EC) 81 MG EC tablet Take 81 mg by mouth daily. Swallow whole.   Yes Historical Provider, MD  cetirizine (ZYRTEC) 10 MG tablet Take 10 mg by mouth daily.   Yes Historical Provider, MD  gabapentin (NEURONTIN) 300 MG capsule Take 300 mg by mouth 3 (three) times daily. 11/03/13  Yes Historical Provider, MD  glimepiride (AMARYL) 4 MG tablet Take 4 mg by mouth daily with breakfast.   Yes  Historical Provider, MD  hydrOXYzine (ATARAX/VISTARIL) 25 MG tablet Take 25 mg by mouth as needed (sleep).  06/29/14  Yes Historical Provider, MD  ipratropium-albuterol (DUONEB) 0.5-2.5 (3) MG/3ML SOLN Take 3 mLs by nebulization every 6 (six) hours as needed. Wheezing 11/10/13  Yes Verlee Monte, MD  lisinopril (PRINIVIL,ZESTRIL) 5 MG tablet Take 5 mg by mouth daily.   Yes Historical Provider, MD  meloxicam (MOBIC) 7.5 MG tablet Take 1 tablet (7.5 mg total) by mouth daily as needed for pain (wrist pain). Patient taking differently: Take 7.5 mg by mouth daily.  02/13/14  Yes Melony Overly, MD  metFORMIN (GLUCOPHAGE) 500 MG tablet Take 500 mg by mouth 2 (two) times daily. 06/29/14  Yes Historical Provider, MD  VIAGRA 100 MG tablet Take 100 mg by mouth as needed for erectile dysfunction.  06/19/14  Yes Historical Provider, MD  cyclobenzaprine (FLEXERIL) 10 MG tablet Take 1 tablet (10 mg total) by mouth 2 (two) times daily as needed for muscle spasms. 07/02/14   Winton Offord Marilu Favre, PA-C  HYDROcodone-acetaminophen (NORCO/VICODIN) 5-325 MG per tablet Take 1-2 tablets by mouth every 4 (four) hours as needed. 07/02/14   Momin Misko Marilu Favre, PA-C  ibuprofen (ADVIL,MOTRIN) 600 MG tablet Take 1 tablet (600 mg total) by mouth every 6 (six) hours as needed. 07/02/14   Alynah Schone Marilu Favre, PA-C  lisinopril-hydrochlorothiazide (PRINZIDE,ZESTORETIC) 20-25 MG per tablet Take 1 tablet by mouth daily. 04/19/12   Harden Mo, MD  metFORMIN (GLUCOPHAGE) 1000 MG tablet Take 1,000 mg by mouth 2 (two) times daily with a meal.    Historical Provider, MD  sulfamethoxazole-trimethoprim (BACTRIM DS) 800-160 MG per tablet Take 1 tablet by mouth once. 02/06/14   Evelina Bucy, MD   BP 110/69 mmHg  Pulse 78  Temp(Src) 98.3 F (36.8 C) (Oral)  Resp 13  SpO2 99% Physical Exam  Constitutional: He appears well-developed and well-nourished. No distress.  HENT:  Head: Normocephalic and atraumatic. Head is without raccoon's eyes, without Battle's sign, without abrasion, without contusion and without laceration.  Right Ear: Tympanic membrane and ear canal normal.  Left Ear: Tympanic membrane and ear canal normal.  Nose: Nose normal.  Mouth/Throat: Uvula is midline, oropharynx is clear and moist and mucous membranes are normal.  Eyes: Pupils are equal, round, and reactive to light.  Neck: Normal range of motion. Neck supple.  Cardiovascular: Normal rate and regular rhythm.   Pulmonary/Chest: Effort normal and breath sounds normal. He has no decreased breath sounds. He has no wheezes. He has no rhonchi. He has no  rales.   He exhibits tenderness (mild) and bony tenderness.    No ecchymosis to back or chest wall. No laceration No crepitus Lung sounds are equal in all fields.  Abdominal: Soft. Bowel sounds are normal. He exhibits no distension and no fluid wave. There is no tenderness. There is no rigidity and no guarding.  Neurological: He is alert.  Skin: Skin is warm and dry.  Nursing note and vitals reviewed.   ED Course  Procedures (including critical care time) Labs Review Labs Reviewed - No data to display  Imaging Review No results found.   EKG Interpretation   Date/Time:  Sunday July 02 2014 23:43:36 EST Ventricular Rate:  79 PR Interval:  190 QRS Duration: 96 QT Interval:  403 QTC Calculation: 462 R Axis:   35 Text Interpretation:  Age not entered, assumed to be  48 years old for  purpose of ECG interpretation Sinus rhythm Low voltage, precordial leads  ED PHYSICIAN INTERPRETATION AVAILABLE IN [facility] Confirmed by  TEST, Record ([id number]) on 07/04/2014 4:23:29 PM      Date: 07/02/2014  Rate: 79  Rhythm: normal sinus rhythm  QRS Axis: normal  Intervals: normal  ST/T Wave abnormalities: normal  Conduction Disutrbances:none  Narrative Interpretation:   Old EKG Reviewed: unchanged    MDM   Final diagnoses:  Fall, initial encounter  Rib contusion, left, initial encounter   Chest xray shows no rib fractures or pneumothorax. He is not hypoxic He does not have significant bony tenderness as I would expect in rib fracture, his pain is more MSK.   He has been given incentive spirometry and told to use it once an hour to help him take large breaths to prevent pneumonia.   NSAIDs and pain rx given Referral to Ortho  48 y.o.Dreshon Mcgillis's evaluation in the Emergency Department is complete. It has been determined that no acute conditions requiring further emergency intervention are present at this time. The patient/guardian have been advised of the diagnosis  and plan. We have discussed signs and symptoms that warrant return to the ED, such as changes or worsening in symptoms.  Vital signs are stable at discharge. Filed Vitals:   07/02/14 2345  BP: 110/69  Pulse: 78  Temp:   Resp:     Patient/guardian has voiced understanding and agreed to follow-up with the PCP or specialist.      Linus Mako, PA-C 07/06/14 6045  Ernestina Patches, MD 07/07/14 0022

## 2014-08-10 ENCOUNTER — Encounter (HOSPITAL_COMMUNITY): Payer: Self-pay | Admitting: Surgery

## 2014-09-14 ENCOUNTER — Encounter (HOSPITAL_COMMUNITY): Payer: Self-pay | Admitting: *Deleted

## 2014-09-14 ENCOUNTER — Emergency Department (INDEPENDENT_AMBULATORY_CARE_PROVIDER_SITE_OTHER)
Admission: EM | Admit: 2014-09-14 | Discharge: 2014-09-14 | Disposition: A | Discharge status: Home / Self Care | Payer: BLUE CROSS/BLUE SHIELD | Source: Home / Self Care | Attending: Family Medicine | Admitting: Family Medicine

## 2014-09-14 DIAGNOSIS — J039 Acute tonsillitis, unspecified: Secondary | ICD-10-CM | POA: Diagnosis not present

## 2014-09-14 DIAGNOSIS — J45901 Unspecified asthma with (acute) exacerbation: Secondary | ICD-10-CM

## 2014-09-14 LAB — POCT RAPID STREP A: Streptococcus, Group A Screen (Direct): NEGATIVE

## 2014-09-14 MED ORDER — PREDNISONE 50 MG PO TABS: 50.0000 mg | ORAL_TABLET | Freq: Every day | ORAL | Status: DC

## 2014-09-14 MED ORDER — IPRATROPIUM-ALBUTEROL 0.5-2.5 (3) MG/3ML IN SOLN
RESPIRATORY_TRACT | Status: AC
  Filled 2014-09-14: qty 3

## 2014-09-14 MED ORDER — IPRATROPIUM-ALBUTEROL 0.5-2.5 (3) MG/3ML IN SOLN
3.0000 mL | Freq: Once | RESPIRATORY_TRACT | Status: AC
  Administered 2014-09-14: 3 mL via RESPIRATORY_TRACT

## 2014-09-14 NOTE — ED Provider Notes (Signed)
Troy [name] is a 49 y.o. male who presents to Urgent Care today for Sore throat. Patient developed sore throat today. He notes pain with swallowing. Symptoms are moderate. He additionally notes wheezing and congestion present for about 3 weeks. He's been using his nebulizer which seems to help. He has an appointment with his primary care provider tomorrow to address his asthma.   Past Medical History  Diagnosis Date  . Hypertension   . Diabetes mellitus   . Gout   . Bronchitis   . Kidney stones 11/30/2013  . Asthma   . Neuromuscular disorder   . Obesity    Past Surgical History  Procedure Laterality Date  . No past surgeries    . Cholecystectomy N/A 12/03/2013    Procedure: LAPAROSCOPIC CHOLECYSTECTOMY WITH INTRAOPERATIVE CHOLANGIOGRAM;  Surgeon: Rolm Bookbinder, MD;  Location: [facility];  Service: General;  Laterality: N/A;   History  Substance Use Topics  . Smoking status: Never Smoker   . Smokeless tobacco: Not on file  . Alcohol Use: Yes     Comment: occasionally   ROS as above Medications: No current facility-administered medications for this encounter.   Current Outpatient Prescriptions  Medication Sig Dispense Refill  . ADVAIR DISKUS 250-50 MCG/DOSE AEPB Inhale 1 puff into the lungs 2 (two) times daily.  0  . albuterol (PROVENTIL HFA;VENTOLIN HFA) 108 (90 BASE) MCG/ACT inhaler Inhale 2 puffs into the lungs every 6 (six) hours as needed for wheezing or shortness of breath. 1 Inhaler 2  . aspirin (ASPIRIN EC) 81 MG EC tablet Take 81 mg by mouth daily. Swallow whole.    . cetirizine (ZYRTEC) 10 MG tablet Take 10 mg by mouth daily.    Marland Kitchen gabapentin (NEURONTIN) 300 MG capsule Take 300 mg by mouth 3 (three) times daily.    Marland Kitchen glimepiride (AMARYL) 4 MG tablet Take 4 mg by mouth daily with breakfast.    . ipratropium-albuterol (DUONEB) 0.5-2.5 (3) MG/3ML SOLN Take 3 mLs by nebulization every 6 (six) hours as needed. Wheezing    . lisinopril (PRINIVIL,ZESTRIL) 5 MG tablet Take 5 mg  by mouth daily.    . meloxicam (MOBIC) 7.5 MG tablet Take 1 tablet (7.5 mg total) by mouth daily as needed for pain (wrist pain). (Patient taking differently: Take 7.5 mg by mouth daily. ) 30 tablet 0  . metFORMIN (GLUCOPHAGE) 500 MG tablet Take 500 mg by mouth 2 (two) times daily.  5  . VIAGRA 100 MG tablet Take 100 mg by mouth as needed for erectile dysfunction.   11  . hydrOXYzine (ATARAX/VISTARIL) 25 MG tablet Take 25 mg by mouth as needed (sleep).   2  . predniSONE (DELTASONE) 50 MG tablet Take 1 tablet (50 mg total) by mouth daily. 5 tablet 0  . [DISCONTINUED] lisinopril-hydrochlorothiazide (PRINZIDE,ZESTORETIC) 20-25 MG per tablet Take 1 tablet by mouth daily. 30 tablet 0   No Known Allergies   Exam:  BP 148/97 mmHg  Pulse 90  Temp(Src) 98.5 F (36.9 C) (Oral)  Resp 20  SpO2 97% Gen: Well NAD HEENT: EOMI,  MMM tonsillar hypertrophy without exudate bilaterally. Normal tympanic membranes. Lungs: Normal work of breathing. Wheezing present bilaterally with prolonged air movement bilaterally Heart: RRR no MRG Abd: NABS, Soft. Nondistended, Nontender Exts: Brisk capillary refill, warm and well perfused.   Patient was given a 2.5/0.5 mg DuoNeb nebulizer treatment and felt better  Results for orders placed or performed during the hospital encounter of 09/14/14 (from the past 24 hour(s))  POCT rapid strep  A [facility])     Status: None   Collection Time: 09/14/14  2:14 PM  Result Value Ref Range   Streptococcus, Group A Screen (Direct) NEGATIVE NEGATIVE   No results found.  Assessment and Plan: 49 y.o. male with sore throat and wheezing. Sore throat appears to be viral however patient does have tonsillar hypertrophy. Throat culture is pending. Plan to treat with prednisone for the sore throat and asthma exacerbation. Continue albuterol. Follow-up with PCP tomorrow.  Discussed warning signs or symptoms. Please see discharge instructions. Patient expresses  understanding.     Gregor Hams, MD 09/14/14 [id number]

## 2014-09-14 NOTE — Discharge Instructions (Signed)
Thank you for coming in today.   Asthma Asthma is a recurring condition in which the airways tighten and narrow. Asthma can make it difficult to breathe. It can cause coughing, wheezing, and shortness of breath. Asthma episodes, also called asthma attacks, range from minor to life-threatening. Asthma cannot be cured, but medicines and lifestyle changes can help control it. CAUSES Asthma is believed to be caused by inherited (genetic) and environmental factors, but its exact cause is unknown. Asthma may be triggered by allergens, lung infections, or irritants in the air. Asthma triggers are different for each person. Common triggers include:   Animal dander.  Dust mites.  Cockroaches.  Pollen from trees or grass.  Mold.  Smoke.  Air pollutants such as dust, household cleaners, hair sprays, aerosol sprays, paint fumes, strong chemicals, or strong odors.  Cold air, weather changes, and winds (which increase molds and pollens in the air).  Strong emotional expressions such as crying or laughing hard.  Stress.  Certain medicines (such as aspirin) or types of drugs (such as beta-blockers).  Sulfites in foods and drinks. Foods and drinks that may contain sulfites include dried fruit, potato chips, and sparkling grape juice.  Infections or inflammatory conditions such as the flu, a cold, or an inflammation of the nasal membranes (rhinitis).  Gastroesophageal reflux disease (GERD).  Exercise or strenuous activity. SYMPTOMS Symptoms may occur immediately after asthma is triggered or many hours later. Symptoms include:  Wheezing.  Excessive nighttime or early morning coughing.  Frequent or severe coughing with a common cold.  Chest tightness.  Shortness of breath. DIAGNOSIS  The diagnosis of asthma is made by a review of your medical history and a physical exam. Tests may also be performed. These may include:  Lung function studies. These tests show how much air you breathe  in and out.  Allergy tests.  Imaging tests such as X-rays. TREATMENT  Asthma cannot be cured, but it can usually be controlled. Treatment involves identifying and avoiding your asthma triggers. It also involves medicines. There are 2 classes of medicine used for asthma treatment:   Controller medicines. These prevent asthma symptoms from occurring. They are usually taken every day.  Reliever or rescue medicines. These quickly relieve asthma symptoms. They are used as needed and provide short-term relief. Your health care provider will help you create an asthma action plan. An asthma action plan is a written plan for managing and treating your asthma attacks. It includes a list of your asthma triggers and how they may be avoided. It also includes information on when medicines should be taken and when their dosage should be changed. An action plan may also involve the use of a device called a peak flow meter. A peak flow meter measures how well the lungs are working. It helps you monitor your condition. HOME CARE INSTRUCTIONS   Take medicines only as directed by your health care provider. Speak with your health care provider if you have questions about how or when to take the medicines.  Use a peak flow meter as directed by your health care provider. Record and keep track of readings.  Understand and use the action plan to help minimize or stop an asthma attack without needing to seek medical care.  Control your home environment in the following ways to help prevent asthma attacks:  Do not smoke. Avoid being exposed to secondhand smoke.  Change your heating and air conditioning filter regularly.  Limit your use of fireplaces and wood stoves.  Get rid of pests (such as roaches and mice) and their droppings.  Throw away plants if you see mold on them.  Clean your floors and dust regularly. Use unscented cleaning products.  Try to have someone else vacuum for you regularly. Stay out of  rooms while they are being vacuumed and for a short while afterward. If you vacuum, use a dust mask from a hardware store, a double-layered or microfilter vacuum cleaner bag, or a vacuum cleaner with a HEPA filter.  Replace carpet with wood, tile, or vinyl flooring. Carpet can trap dander and dust.  Use allergy-proof pillows, mattress covers, and box spring covers.  Wash bed sheets and blankets every week in hot water and dry them in a dryer.  Use blankets that are made of polyester or cotton.  Clean bathrooms and kitchens with bleach. If possible, have someone repaint the walls in these rooms with mold-resistant paint. Keep out of the rooms that are being cleaned and painted.  Wash hands frequently. SEEK MEDICAL CARE IF:   You have wheezing, shortness of breath, or a cough even if taking medicine to prevent attacks.  The colored mucus you cough up (sputum) is thicker than usual.  Your sputum changes from clear or white to yellow, green, gray, or bloody.  You have any problems that may be related to the medicines you are taking (such as a rash, itching, swelling, or trouble breathing).  You are using a reliever medicine more than 2-3 times per week.  Your peak flow is still at 50-79% of your personal best after following your action plan for 1 hour.  You have a fever. SEEK IMMEDIATE MEDICAL CARE IF:   You seem to be getting worse and are unresponsive to treatment during an asthma attack.  You are short of breath even at rest.  You get short of breath when doing very little physical activity.  You have difficulty eating, drinking, or talking due to asthma symptoms.  You develop chest pain.  You develop a fast heartbeat.  You have a bluish color to your lips or fingernails.  You are light-headed, dizzy, or faint.  Your peak flow is less than 50% of your personal best. MAKE SURE YOU:   Understand these instructions.  Will watch your condition.  Will get help right  away if you are not doing well or get worse. Document Released: 07/14/2005 Document Revised: 11/28/2013 Document Reviewed: 02/10/2013 [facility] Patient Information 2015 [place], [place]. This information is not intended to replace advice given to you by your health care provider. Make sure you discuss any questions you have with your health care provider.

## 2014-09-14 NOTE — ED Notes (Signed)
C/o sore throat onset this AM.  C/o chills now and felt like he had a fever this AM.  Took Tylenol 1000 mg. @ 0900.  States his neck is a little sore on the sides.  C/o nasal congestion and cough and slight headache.  No earache.

## 2014-09-16 LAB — CULTURE, GROUP A STREP: STREP A CULTURE: NEGATIVE

## 2014-12-18 ENCOUNTER — Other Ambulatory Visit: Payer: Self-pay | Admitting: Internal Medicine

## 2014-12-18 ENCOUNTER — Ambulatory Visit
Admission: RE | Admit: 2014-12-18 | Discharge: 2014-12-18 | Disposition: A | Discharge status: Home / Self Care | Payer: BLUE CROSS/BLUE SHIELD | Source: Ambulatory Visit | Attending: Internal Medicine | Admitting: Internal Medicine

## 2014-12-18 DIAGNOSIS — J45901 Unspecified asthma with (acute) exacerbation: Secondary | ICD-10-CM

## 2015-06-02 DIAGNOSIS — Z79899 Other long term (current) drug therapy: Secondary | ICD-10-CM | POA: Diagnosis not present

## 2015-06-02 DIAGNOSIS — E119 Type 2 diabetes mellitus without complications: Secondary | ICD-10-CM | POA: Insufficient documentation

## 2015-06-02 DIAGNOSIS — E669 Obesity, unspecified: Secondary | ICD-10-CM | POA: Insufficient documentation

## 2015-06-02 DIAGNOSIS — Z87442 Personal history of urinary calculi: Secondary | ICD-10-CM | POA: Insufficient documentation

## 2015-06-02 DIAGNOSIS — J45909 Unspecified asthma, uncomplicated: Secondary | ICD-10-CM | POA: Diagnosis not present

## 2015-06-02 DIAGNOSIS — I10 Essential (primary) hypertension: Secondary | ICD-10-CM | POA: Insufficient documentation

## 2015-06-02 DIAGNOSIS — Z7982 Long term (current) use of aspirin: Secondary | ICD-10-CM | POA: Diagnosis not present

## 2015-06-02 DIAGNOSIS — M109 Gout, unspecified: Secondary | ICD-10-CM | POA: Diagnosis not present

## 2015-06-02 DIAGNOSIS — M62830 Muscle spasm of back: Secondary | ICD-10-CM | POA: Diagnosis not present

## 2015-06-02 DIAGNOSIS — M545 Low back pain: Secondary | ICD-10-CM | POA: Diagnosis present

## 2015-06-03 ENCOUNTER — Emergency Department (HOSPITAL_COMMUNITY)
Admission: EM | Admit: 2015-06-03 | Discharge: 2015-06-03 | Disposition: A | Discharge status: Home / Self Care | Payer: BLUE CROSS/BLUE SHIELD | Attending: Emergency Medicine | Admitting: Emergency Medicine

## 2015-06-03 ENCOUNTER — Encounter (HOSPITAL_COMMUNITY): Payer: Self-pay | Admitting: *Deleted

## 2015-06-03 DIAGNOSIS — M62830 Muscle spasm of back: Secondary | ICD-10-CM

## 2015-06-03 MED ORDER — CYCLOBENZAPRINE HCL 10 MG PO TABS: 5.0000 mg | ORAL_TABLET | Freq: Two times a day (BID) | ORAL | Status: DC | PRN

## 2015-06-03 MED ORDER — OXYCODONE-ACETAMINOPHEN 5-325 MG PO TABS
1.0000 | ORAL_TABLET | Freq: Once | ORAL | Status: AC
  Administered 2015-06-03: 1 via ORAL
  Filled 2015-06-03: qty 1

## 2015-06-03 MED ORDER — DIAZEPAM 5 MG PO TABS
10.0000 mg | ORAL_TABLET | Freq: Once | ORAL | Status: AC
Start: 2015-06-03 — End: 2015-06-03
  Administered 2015-06-03: 10 mg via ORAL
  Filled 2015-06-03: qty 2

## 2015-06-03 MED ORDER — HYDROCODONE-ACETAMINOPHEN 5-325 MG PO TABS: 1.0000 | ORAL_TABLET | ORAL | Status: DC | PRN

## 2015-06-03 MED ORDER — KETOROLAC TROMETHAMINE 60 MG/2ML IM SOLN
60.0000 mg | Freq: Once | INTRAMUSCULAR | Status: AC
  Administered 2015-06-03: 60 mg via INTRAMUSCULAR
  Filled 2015-06-03: qty 2

## 2015-06-03 MED ORDER — ONDANSETRON 4 MG PO TBDP
4.0000 mg | ORAL_TABLET | Freq: Once | ORAL | Status: AC
  Administered 2015-06-03: 4 mg via ORAL
  Filled 2015-06-03: qty 1

## 2015-06-03 NOTE — ED Notes (Signed)
Patient states he has been having pain to the lower back off and on for about 1 month.

## 2015-06-03 NOTE — ED Notes (Signed)
The pt has chronic back pain and tonight at work he started having back pain while working.  The pain comes and goes.

## 2015-06-03 NOTE — ED Notes (Signed)
Discharge instructions and prescriptions reviewed - voiced understanding.  Understands to call MD for follow up

## 2015-06-03 NOTE — ED Notes (Signed)
Discharge instructions reviewed - voiced understanding,

## 2015-06-03 NOTE — ED Provider Notes (Signed)
CSN: [id number]     Arrival date & time 06/02/15  2357 History   First MD Initiated Contact with Patient 06/03/15 0006     Chief Complaint  Patient presents with  . Back Pain     (Consider location/radiation/quality/duration/timing/severity/associated sxs/prior Treatment) HPI   Troy [name] is a 49 y.o. male  PCP: POLITE,RONALD D, MD  Pulse 106, temperature 98.3 F (36.8 C), temperature source Oral, resp. rate 22, height 5\' 10"  (1.778 m), weight 295 lb (133.811 kg), SpO2 98 %.  SIGNIFICANT PMH: hypertension, diabetes, gout, kidney stones, astha, neuromuscular disorder, obesity CHIEF COMPLAINT: back pain  When: while at work this evening Mechanism: patient reports going from a sitting to standing position and have acute onset of sharp back pain across his low back. Troy [name] reports being unable to walk for 1 minute due to pain. Troy [name] was able to start walking again slowly and his pain largely resolved. Troy [name] did not fall Chronicity: reoccuring, pt reports for the last month this been happening multiple times. Troy [name] has not yet been seen for it Location: bilateral low lumbar Radiation: no radiation down the legs Quality and severity: intermittent, severe- made worse by sitting to standing and turning certain directions Treatments tried: Mobic at home, none tonight Alleviating factors: rest  Worsening factors: movement, going from sitting to standing Associated Symptoms: pain Risk Factors:  reoccuring back pain and obsity.  Negative ROS: Confusion, diaphoresis, fever, headache, weakness (general or focal), change of vision, neck pain, dysphagia, aphagia, chest pain, shortness of breath, abdominal pains, nausea, vomiting, diarrhea, lower extremity swelling, rash.   Past Medical History  Diagnosis Date  . Hypertension   . Diabetes mellitus   . Gout   . Bronchitis   . Kidney stones 11/30/2013  . Asthma   . Neuromuscular disorder ([place])   . Obesity    Past Surgical History  Procedure  Laterality Date  . No past surgeries    . Cholecystectomy N/A 12/03/2013    Procedure: LAPAROSCOPIC CHOLECYSTECTOMY WITH INTRAOPERATIVE CHOLANGIOGRAM;  Surgeon: Rolm Bookbinder, MD;  Location: [facility] OR;  Service: General;  Laterality: N/A;   Family History  Problem Relation Age of Onset  . Diabetes Mellitus II Father   . Diabetes Father   . Hypertension Father   . CAD Neg Hx   . Diabetes Mother   . Hypertension Mother    Social History  Substance Use Topics  . Smoking status: Never Smoker   . Smokeless tobacco: None  . Alcohol Use: Yes     Comment: occasionally    Review of Systems  10 Systems reviewed and are negative for acute change except as noted in the HPI.     Allergies  Review of patient's allergies indicates no known allergies.  Home Medications   Prior to Admission medications   Medication Sig Start Date End Date Taking? Authorizing Provider  ADVAIR DISKUS 250-50 MCG/DOSE AEPB Inhale 1 puff into the lungs 2 (two) times daily. 04/20/14   Historical Provider, MD  albuterol (PROVENTIL HFA;VENTOLIN HFA) 108 (90 BASE) MCG/ACT inhaler Inhale 2 puffs into the lungs every 6 (six) hours as needed for wheezing or shortness of breath. 07/20/13   Kinnie Feil, MD  aspirin (ASPIRIN EC) 81 MG EC tablet Take 81 mg by mouth daily. Swallow whole.    Historical Provider, MD  cetirizine (ZYRTEC) 10 MG tablet Take 10 mg by mouth daily.    Historical Provider, MD  cyclobenzaprine (FLEXERIL) 10 MG tablet Take 0.5-1 tablets (5-10 mg total) by  mouth 2 (two) times daily as needed. 06/03/15   Tedric Leeth Carlota Raspberry, PA-C  gabapentin (NEURONTIN) 300 MG capsule Take 300 mg by mouth 3 (three) times daily. 11/03/13   Historical Provider, MD  glimepiride (AMARYL) 4 MG tablet Take 4 mg by mouth daily with breakfast.    Historical Provider, MD  HYDROcodone-acetaminophen (NORCO/VICODIN) 5-325 MG tablet Take 1-2 tablets by mouth every 4 (four) hours as needed. 06/03/15   Delos Haring, PA-C  hydrOXYzine  (ATARAX/VISTARIL) 25 MG tablet Take 25 mg by mouth as needed (sleep).  06/29/14   Historical Provider, MD  ipratropium-albuterol (DUONEB) 0.5-2.5 (3) MG/3ML SOLN Take 3 mLs by nebulization every 6 (six) hours as needed. Wheezing 11/10/13   Verlee Monte, MD  lisinopril (PRINIVIL,ZESTRIL) 5 MG tablet Take 5 mg by mouth daily.    Historical Provider, MD  meloxicam (MOBIC) 7.5 MG tablet Take 1 tablet (7.5 mg total) by mouth daily as needed for pain (wrist pain). Patient taking differently: Take 7.5 mg by mouth daily.  02/13/14   Melony Overly, MD  metFORMIN (GLUCOPHAGE) 500 MG tablet Take 500 mg by mouth 2 (two) times daily. 06/29/14   Historical Provider, MD  predniSONE (DELTASONE) 50 MG tablet Take 1 tablet (50 mg total) by mouth daily. 09/14/14   Gregor Hams, MD  VIAGRA 100 MG tablet Take 100 mg by mouth as needed for erectile dysfunction.  06/19/14   Historical Provider, MD   Pulse 106  Temp(Src) 98.3 F (36.8 C) (Oral)  Resp 22  Ht 5\' 10"  (1.778 m)  Wt 295 lb (133.811 kg)  BMI 42.33 kg/m2  SpO2 98% Physical Exam  Constitutional: Troy [name] appears well-developed and well-nourished. No distress.  HENT:  Head: Normocephalic and atraumatic.  Eyes: Pupils are equal, round, and reactive to light.  Neck: Normal range of motion. Neck supple.  Cardiovascular: Normal rate and regular rhythm.   Pulmonary/Chest: Effort normal.  Abdominal: Soft.  Musculoskeletal:  Symmetrical and physiologic strength to bilateral lower extremities.  Neurosensory function adequate to both legs Skin color is normal. Skin is warm and moist.  No step off deformity appreciated and no midline bony tenderness.  Can ambulate slow at first and then gait improves with multiple steps  No crepitus, laceration, effusion, induration, lesions Pedal pulses are symmetrical and palpable bilaterally  No tenderness to midline, positive tenderness to paraspinal muscles of lumbar spine.  Neurological: Troy [name] is alert.  Skin: Skin is warm and dry.   Nursing note and vitals reviewed.   ED Course  Procedures (including critical care time) Labs Review Labs Reviewed - No data to display  Imaging Review No results found. I have personally reviewed and evaluated these images and lab results as part of my medical decision-making.   EKG Interpretation None      MDM   Final diagnoses:  Spasm of muscle, back    49 y.o.Peighton Zuckerman's  with back pain.   No neurological deficits and normal neuro exam. No loss of bowel or bladder control. No concern for cauda equina at this time base on HPI and physical exam findings. No fever, night sweats, weight loss, h/o cancer, IVDU. The patient can walk with some discomfort.   Patient Plan 1. Medications: Vicodin and muscle relaxer. Cont usual home medications unless otherwise directed. 2. Treatment: rest, drink plenty of fluids, gentle stretching as discussed, alternate ice and heat  3. Follow Up: Please followup with your primary doctor for discussion of your diagnoses and further evaluation after today's visit; if you  do not have a primary care doctor use the resource guide provided to find one  Advised to follow-up with the orthopedist if symptoms do not start to resolve in the next 2-3 days. If develop loss of bowel or urinary control return to the ED as soon as possible for further evaluation. To take the medications as prescribed as they can cause harm if not taken appropriately.   Vital signs are stable at discharge. Filed Vitals:   06/02/15 2359  Pulse: 106  Temp: 98.3 F (36.8 C)  Resp: 22    Patient/guardian has voiced understanding and agreed to follow-up with the PCP or specialist.         Delos Haring, PA-C 06/03/15 [place], MD 06/03/15 [phone]

## 2015-06-03 NOTE — Discharge Instructions (Signed)

## 2015-08-08 ENCOUNTER — Other Ambulatory Visit: Payer: Self-pay | Admitting: Internal Medicine

## 2015-08-08 ENCOUNTER — Ambulatory Visit
Admission: RE | Admit: 2015-08-08 | Discharge: 2015-08-08 | Disposition: A | Discharge status: Home / Self Care | Payer: BLUE CROSS/BLUE SHIELD | Source: Ambulatory Visit | Attending: Internal Medicine | Admitting: Internal Medicine

## 2015-08-08 DIAGNOSIS — M7989 Other specified soft tissue disorders: Secondary | ICD-10-CM

## 2015-08-08 DIAGNOSIS — M1460 Charcot's joint, unspecified site: Secondary | ICD-10-CM

## 2015-08-15 ENCOUNTER — Other Ambulatory Visit: Payer: Self-pay

## 2015-08-17 ENCOUNTER — Ambulatory Visit: Payer: BLUE CROSS/BLUE SHIELD

## 2015-08-17 ENCOUNTER — Encounter: Payer: Self-pay | Admitting: Podiatry

## 2015-08-17 ENCOUNTER — Ambulatory Visit (INDEPENDENT_AMBULATORY_CARE_PROVIDER_SITE_OTHER): Payer: BLUE CROSS/BLUE SHIELD

## 2015-08-17 ENCOUNTER — Ambulatory Visit (INDEPENDENT_AMBULATORY_CARE_PROVIDER_SITE_OTHER): Payer: BLUE CROSS/BLUE SHIELD | Admitting: Podiatry

## 2015-08-17 DIAGNOSIS — M2042 Other hammer toe(s) (acquired), left foot: Secondary | ICD-10-CM | POA: Diagnosis not present

## 2015-08-17 DIAGNOSIS — E119 Type 2 diabetes mellitus without complications: Secondary | ICD-10-CM | POA: Diagnosis not present

## 2015-08-17 DIAGNOSIS — E0841 Diabetes mellitus due to underlying condition with diabetic mononeuropathy: Secondary | ICD-10-CM | POA: Diagnosis not present

## 2015-08-17 DIAGNOSIS — E11610 Type 2 diabetes mellitus with diabetic neuropathic arthropathy: Secondary | ICD-10-CM | POA: Diagnosis not present

## 2015-08-17 NOTE — Progress Notes (Signed)
Subjective:     Patient ID: Troy [name], male   DOB: 1966/07/27, 50 y.o.   MRN: [id number]  HPI patient presents with swelling of the left foot stating that it's been present for a long time and he just wanted to get it checked along with the second digit. He states that he does not really have pain but that he had diabetes that was in very poor control for a number of years and it's only the last few years that he has taken care of at   Review of Systems  All other systems reviewed and are negative.      Objective:   Physical Exam  Constitutional: He is oriented to person, place, and time.  Cardiovascular: Intact distal pulses.   Musculoskeletal: Normal range of motion.  Neurological: He is alert and oriented to person, place, and time.  Skin: Skin is warm and dry.  Nursing note and vitals reviewed.  neurological status is found to be significantly diminished both sharp [place]ole vibratory and diminished pulses was noted left over right but when questioned no apparent claudication symptoms are present as he does work full-time is able to walk without pain. He has significant foot structural issues bilateral left over right with Charcot-type foot disease secondary to long-term diabetes and advanced neuropathy along with digital abnormalities second toe left over right.     Assessment:     Patient has chronic edema in his feet secondary to Charcot foot with digital deformity second left and long-term diabetes with numerous risk factors    Plan:     H&P and x-rays reviewed and discussed the risk factors associated with his neuropathy also that it may be affecting his cardiac function kidney function and I function. At this time I do recommend diabetic shoes and I dispensed a buttress pad to lift the second toe and I did explain that ultimately may require amputation or he may ultimately require some form of advanced brace therapy or advanced surgery. Since she's not and pain is able to work  full-time now to hold off on this border to monitor him closely and I gave him strict instructions of any breaks in tissue were to occur or any other abnormalities he is to let us know immediately

## 2015-08-17 NOTE — Progress Notes (Signed)
   Subjective:    Patient ID: Troy [name], male    DOB: 1966-02-29, 50 y.o.   MRN: [id number]  HPI LT FOOT IS SWOLLEN/THROBBING FOR 3 MONTHS. FOOT IS GETTING WORSE ESPECIALLY WHEN WALKING. ALSO THE 2ND TOE IS TURNING THE OTHER WAY-TRIED NO TREATMENT.  Review of Systems  Respiratory: Positive for cough.   Cardiovascular: Positive for leg swelling.  Musculoskeletal: Positive for joint swelling.       Objective:   Physical Exam        Assessment & Plan:

## 2015-08-25 ENCOUNTER — Encounter (HOSPITAL_COMMUNITY): Payer: Self-pay | Admitting: *Deleted

## 2015-08-25 ENCOUNTER — Emergency Department (HOSPITAL_COMMUNITY): Payer: BLUE CROSS/BLUE SHIELD

## 2015-08-25 ENCOUNTER — Emergency Department (HOSPITAL_COMMUNITY)
Admission: EM | Admit: 2015-08-25 | Discharge: 2015-08-25 | Disposition: A | Discharge status: Home / Self Care | Payer: BLUE CROSS/BLUE SHIELD | Attending: Emergency Medicine | Admitting: Emergency Medicine

## 2015-08-25 DIAGNOSIS — E119 Type 2 diabetes mellitus without complications: Secondary | ICD-10-CM | POA: Diagnosis not present

## 2015-08-25 DIAGNOSIS — Z7984 Long term (current) use of oral hypoglycemic drugs: Secondary | ICD-10-CM | POA: Diagnosis not present

## 2015-08-25 DIAGNOSIS — R0602 Shortness of breath: Secondary | ICD-10-CM | POA: Diagnosis present

## 2015-08-25 DIAGNOSIS — E669 Obesity, unspecified: Secondary | ICD-10-CM | POA: Diagnosis not present

## 2015-08-25 DIAGNOSIS — J45901 Unspecified asthma with (acute) exacerbation: Secondary | ICD-10-CM | POA: Diagnosis not present

## 2015-08-25 DIAGNOSIS — R600 Localized edema: Secondary | ICD-10-CM | POA: Diagnosis not present

## 2015-08-25 DIAGNOSIS — I10 Essential (primary) hypertension: Secondary | ICD-10-CM | POA: Insufficient documentation

## 2015-08-25 DIAGNOSIS — M109 Gout, unspecified: Secondary | ICD-10-CM | POA: Diagnosis not present

## 2015-08-25 DIAGNOSIS — Z87442 Personal history of urinary calculi: Secondary | ICD-10-CM | POA: Diagnosis not present

## 2015-08-25 DIAGNOSIS — Z8669 Personal history of other diseases of the nervous system and sense organs: Secondary | ICD-10-CM | POA: Insufficient documentation

## 2015-08-25 DIAGNOSIS — Z79899 Other long term (current) drug therapy: Secondary | ICD-10-CM | POA: Insufficient documentation

## 2015-08-25 DIAGNOSIS — Z7982 Long term (current) use of aspirin: Secondary | ICD-10-CM | POA: Insufficient documentation

## 2015-08-25 DIAGNOSIS — Z7952 Long term (current) use of systemic steroids: Secondary | ICD-10-CM | POA: Insufficient documentation

## 2015-08-25 LAB — BASIC METABOLIC PANEL
Anion gap: 15 (ref 5–15)
BUN: 43 mg/dL — ABNORMAL HIGH (ref 6–20)
CHLORIDE: 105 mmol/L (ref 101–111)
CO2: 19 mmol/L — ABNORMAL LOW (ref 22–32)
Calcium: 9.9 mg/dL (ref 8.9–10.3)
Creatinine, Ser: 1.29 mg/dL — ABNORMAL HIGH (ref 0.61–1.24)
GFR calc Af Amer: >60 mL/min (ref >60)
Glucose, Bld: 106 mg/dL — ABNORMAL HIGH (ref 65–99)
POTASSIUM: 5.5 mmol/L — AB (ref 3.5–5.1)
SODIUM: 139 mmol/L (ref 135–145)

## 2015-08-25 LAB — CBC
HEMATOCRIT: 40.1 % (ref 39.0–52.0)
HEMOGLOBIN: 13.4 g/dL (ref 13.0–17.0)
MCH: 29.1 pg (ref 26.0–34.0)
MCHC: 33.4 g/dL (ref 30.0–36.0)
MCV: 87 fL (ref 78.0–100.0)
Platelets: 300 10*3/uL (ref 150–400)
RBC: 4.61 MIL/uL (ref 4.22–5.81)
RDW: 14.7 % (ref 11.5–15.5)
WBC: 10.5 10*3/uL (ref 4.0–10.5)

## 2015-08-25 LAB — BRAIN NATRIURETIC PEPTIDE: B NATRIURETIC PEPTIDE 5: 15.7 pg/mL (ref 0.0–100.0)

## 2015-08-25 LAB — I-STAT TROPONIN, ED: Troponin i, poc: 0.02 ng/mL (ref 0.00–0.08)

## 2015-08-25 LAB — D-DIMER, QUANTITATIVE (NOT AT ARMC): D DIMER QUANT: 3.32 ug{FEU}/mL — AB (ref 0.00–0.50)

## 2015-08-25 MED ORDER — PREDNISONE 20 MG PO TABS: 60.0000 mg | ORAL_TABLET | Freq: Every day | ORAL | Status: DC

## 2015-08-25 MED ORDER — PREDNISONE 20 MG PO TABS
60.0000 mg | ORAL_TABLET | Freq: Once | ORAL | Status: AC
  Administered 2015-08-25: 60 mg via ORAL
  Filled 2015-08-25: qty 3

## 2015-08-25 MED ORDER — IPRATROPIUM BROMIDE 0.02 % IN SOLN
0.5000 mg | Freq: Once | RESPIRATORY_TRACT | Status: AC
  Administered 2015-08-25: 0.5 mg via RESPIRATORY_TRACT
  Filled 2015-08-25: qty 2.5

## 2015-08-25 MED ORDER — ALBUTEROL SULFATE (2.5 MG/3ML) 0.083% IN NEBU
5.0000 mg | INHALATION_SOLUTION | Freq: Once | RESPIRATORY_TRACT | Status: AC
  Administered 2015-08-25: 5 mg via RESPIRATORY_TRACT
  Filled 2015-08-25: qty 6

## 2015-08-25 MED ORDER — ALBUTEROL SULFATE HFA 108 (90 BASE) MCG/ACT IN AERS: 1.0000 | INHALATION_SPRAY | Freq: Four times a day (QID) | RESPIRATORY_TRACT | Status: AC | PRN

## 2015-08-25 MED ORDER — IOHEXOL 350 MG/ML SOLN
100.0000 mL | Freq: Once | INTRAVENOUS | Status: AC | PRN
  Administered 2015-08-25: 75 mL via INTRAVENOUS

## 2015-08-25 NOTE — ED Notes (Addendum)
Pt sent here from [facility] physicians for sob onset yesterday. Having recent cough, swelling to legs and feeling lightheaded. EKG done at triage and no resp distress noted.

## 2015-08-25 NOTE — ED Provider Notes (Signed)
CSN: [id number]     Arrival date & time 08/25/15  1618 History   First MD Initiated Contact with Patient 08/25/15 1710     Chief Complaint  Patient presents with  . Shortness of Breath     (Consider location/radiation/quality/duration/timing/severity/associated sxs/prior Treatment) HPI Comments: Patient with a history of Asthma presents today with a chief complaint of SOB.  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  He states that the episodes lasted approximately 30 minutes and then resolved.  He states that again today he became SOB while walking from his car.  He denies feeling SOB at rest.  He was seen by the walk in clinic with [facility] Physician's earlier today and sent to the ED for further evaluation.  Review of the paperwork shows that they were concerned about possible Pneumonia.  He reports that he has had a cough for the past month.  He denies fever, chills, hemoptysis, dizziness, syncope, numbness, tingling, or diaphoresis.  He does report swelling of his LLE, but states that this is chronic.  He denies history of DVT or PE.  Denies prolonged travel or surgeries in the past 4 weeks.  Denies any cardiac history.  He does have a history of HTN and DM.  Denies any family history of cardiac disease.  He does not smoke.    The history is provided by the patient.    Past Medical History  Diagnosis Date  . Hypertension   . Diabetes mellitus   . Gout   . Bronchitis   . Kidney stones 11/30/2013  . Asthma   . Neuromuscular disorder ([place])   . Obesity    Past Surgical History  Procedure Laterality Date  . No past surgeries    . Cholecystectomy N/A 12/03/2013    Procedure: LAPAROSCOPIC CHOLECYSTECTOMY WITH INTRAOPERATIVE CHOLANGIOGRAM;  Surgeon: Rolm Bookbinder, MD;  Location: [facility] OR;  Service: General;  Laterality: N/A;   Family History  Problem Relation Age of Onset  . Diabetes Mellitus II Father   . Diabetes Father   . Hypertension Father   . CAD Neg Hx   .  Diabetes Mother   . Hypertension Mother    Social History  Substance Use Topics  . Smoking status: Never Smoker   . Smokeless tobacco: None  . Alcohol Use: Yes     Comment: occasionally    Review of Systems  All other systems reviewed and are negative.     Allergies  Review of patient's allergies indicates no known allergies.  Home Medications   Prior to Admission medications   Medication Sig Start Date End Date Taking? Authorizing Provider  acetaminophen (TYLENOL) 500 MG tablet Take 1,000 mg by mouth every 6 (six) hours as needed for moderate pain.   Yes Historical Provider, MD  albuterol (PROVENTIL HFA;VENTOLIN HFA) 108 (90 BASE) MCG/ACT inhaler Inhale 2 puffs into the lungs every 6 (six) hours as needed for wheezing or shortness of breath. 07/20/13  Yes Kinnie Feil, MD  aspirin (ASPIRIN EC) 81 MG EC tablet Take 81 mg by mouth daily. Swallow whole.   Yes Historical Provider, MD  BREO ELLIPTA 100-25 MCG/INH AEPB Inhale 1 puff into the lungs daily.  07/31/15  Yes Historical Provider, MD  cetirizine (ZYRTEC) 10 MG tablet Take 10 mg by mouth daily.   Yes Historical Provider, MD  cyclobenzaprine (FLEXERIL) 10 MG tablet Take 0.5-1 tablets (5-10 mg total) by mouth 2 (two) times daily as needed. 06/03/15  Yes Delos Haring, PA-C  glimepiride (AMARYL) 4 MG tablet Take 4 mg by mouth daily with breakfast.   Yes Historical Provider, MD  HYDROcodone-acetaminophen (NORCO) 10-325 MG tablet Take 1 tablet by mouth every 6 (six) hours as needed for severe pain.  08/15/15  Yes Historical Provider, MD  lisinopril-hydrochlorothiazide (PRINZIDE,ZESTORETIC) 10-12.5 MG tablet Take 1 tablet by mouth daily.  07/31/15  Yes Historical Provider, MD  metFORMIN (GLUCOPHAGE) 500 MG tablet Take 1,000 mg by mouth 2 (two) times daily.  06/29/14  Yes Historical Provider, MD  VIAGRA 100 MG tablet Take 100 mg by mouth as needed for erectile dysfunction.  06/19/14  Yes Historical Provider, MD  HYDROcodone-acetaminophen  (NORCO/VICODIN) 5-325 MG tablet Take 1-2 tablets by mouth every 4 (four) hours as needed. 06/03/15   Tiffany Carlota Raspberry, PA-C  meloxicam (MOBIC) 7.5 MG tablet Take 1 tablet (7.5 mg total) by mouth daily as needed for pain (wrist pain). Patient taking differently: Take 7.5 mg by mouth daily.  02/13/14   Melony Overly, MD  predniSONE (DELTASONE) 50 MG tablet Take 1 tablet (50 mg total) by mouth daily. 09/14/14   Gregor Hams, MD   BP 146/77 mmHg  Pulse 90  Temp(Src) 98.3 F (36.8 C) (Oral)  Resp 20  Ht 5\' 10"  (1.778 m)  Wt 129.729 kg  BMI 41.04 kg/m2  SpO2 100% Physical Exam  Constitutional: He appears well-developed and well-nourished.  HENT:  Head: Normocephalic and atraumatic.  Mouth/Throat: Oropharynx is clear and moist.  Neck: Normal range of motion. Neck supple.  Cardiovascular: Normal rate, regular rhythm and normal heart sounds.   Pulses:      Dorsalis pedis pulses are 2+ on the right side, and 2+ on the left side.  Pulmonary/Chest: Effort normal. No respiratory distress.  Slight expiratory wheeze at the base of both lungs  Musculoskeletal: Normal range of motion.  2+ pitting edema of LLE, no erythema or warmth.  No edema, erythema, or warmth of RLE.    Neurological: He is alert.  Skin: Skin is warm and dry. He is not diaphoretic.  Psychiatric: He has a normal mood and affect.  Nursing note and vitals reviewed.   ED Course  Procedures (including critical care time) Labs Review Labs Reviewed  BASIC METABOLIC PANEL - Abnormal; Notable for the following:    Potassium 5.5 (*)    CO2 19 (*)    Glucose, Bld 106 (*)    BUN 43 (*)    Creatinine, Ser 1.29 (*)    All other components within normal limits  CBC  I-STAT TROPOININ, ED    Imaging Review Dg Chest 2 View  08/25/2015  CLINICAL DATA:  Shortness breath. EXAM: CHEST  2 VIEW COMPARISON:  12/18/2014 FINDINGS: Heart and mediastinal contours are within normal limits. No focal opacities or effusions. No acute bony abnormality.  IMPRESSION: No active cardiopulmonary disease. Electronically Signed   By: Rolm Baptise M.D.   On: 08/25/2015 16:56   Ct Angio Chest Pe W/cm &/or Wo Cm  08/25/2015  CLINICAL DATA:  50 year old who presents with acute onset of shortness of breath yesterday associated with bilateral lower extremity edema and lightheadedness. EXAM: CT ANGIOGRAPHY CHEST WITH CONTRAST TECHNIQUE: Multidetector CT imaging of the chest was performed using the standard protocol during bolus administration of intravenous contrast. Multiplanar CT image reconstructions and MIPs were obtained to evaluate the vascular anatomy. CONTRAST:  11mL OMNIPAQUE IOHEXOL 350 MG/ML IV. COMPARISON:  No prior CT. Multiple prior chest x-rays including earlier today. FINDINGS: Technical quality:  Good. Pulmonary embolism:  Absent. Cardiovascular: Heart size upper normal with evidence of left ventricular hypertrophy. No visible coronary atherosclerosis. No significant pericardial effusion. No visible atherosclerosis involving the thoracic or upper abdominal aorta or their visualized branches, including the great vessels. Mediastinum/Lymph Nodes: Numerous normal-sized mediastinal, hilar and axillary lymph nodes. No pathologic lymphadenopathy. Normal-appearing esophagus. Normal appearing thyroid gland. Lungs/Pleura: Minimal dependent atelectasis deep in the left lower lobe. Minimal ground-glass opacity involving the right middle lobe. Lungs otherwise clear. No pleural effusions. Prominent subpleural fat at the left base. Central airways patent without significant bronchial wall thickening. Upper abdomen: Surgically absent gallbladder. No acute abnormalities. Accessory splenic tissue anterior to the lower pole of the spleen. Musculoskeletal: DISH involving the entire thoracic spine. Review of the MIP images confirms the above findings. IMPRESSION: 1. No evidence of pulmonary embolism. 2. Minimal dependent atelectasis deep in the left lower lobe. Minimal  ground-glass airspace opacity in the right middle lobe likely indicating minimal inflammation. No acute cardiopulmonary disease otherwise. 3. Upper normal heart size with left ventricular hypertrophy. Electronically Signed   By: Evangeline Dakin M.D.   On: 08/25/2015 21:20   I have personally reviewed and evaluated these images and lab results as part of my medical decision-making.   EKG Interpretation   Date/Time:    Ventricular Rate:    PR Interval:    QRS Duration:   QT Interval:    QTC Calculation:   R Axis:     Text Interpretation:        MDM   Final diagnoses:  None   Patient presents today due to an episode of SOB that occurred yesterday.  He denies any CP with this episode.  VSS today.  No hypoxia.  No ischemic changes on EKG.  Troponin negative.  CXR negative.  D-dimer found to be positive.  Therefore, CT angio chest was ordered to rule out PE.  CT angio negative for PE.  Patient does have unilateral LE edema, which he reports is chronic.  However, patient instructed to return in the morning for a Doppler to rule out DVT.  Patient does have a history of Asthma and slight wheeze to the base of both lungs.  Patient given duoneb in the ED and Prednisone.  Feel that the patient is stable for discharge.  Return precautions given.    Hyman Bible, PA-C 08/26/15 2204  Carmin Muskrat, MD 08/26/15 [phone]

## 2015-08-25 NOTE — ED Notes (Signed)
Patient transported to CT 

## 2015-08-27 ENCOUNTER — Ambulatory Visit (HOSPITAL_COMMUNITY)
Admission: RE | Admit: 2015-08-27 | Discharge: 2015-08-27 | Disposition: A | Discharge status: Home / Self Care | Payer: BLUE CROSS/BLUE SHIELD | Source: Ambulatory Visit | Attending: Emergency Medicine | Admitting: Emergency Medicine

## 2015-08-27 ENCOUNTER — Other Ambulatory Visit (HOSPITAL_COMMUNITY): Payer: Self-pay | Admitting: Emergency Medicine

## 2015-08-27 DIAGNOSIS — M7989 Other specified soft tissue disorders: Secondary | ICD-10-CM | POA: Insufficient documentation

## 2015-08-27 DIAGNOSIS — R52 Pain, unspecified: Secondary | ICD-10-CM

## 2015-08-27 NOTE — Progress Notes (Signed)
Preliminary results by tech - Venous Duplex Lower Ext. Completed. No evidence of deep or superficial vein thrombosis in both lower extremities. Clyda Smyth, BS, RDMS, RVT  

## 2015-10-23 ENCOUNTER — Ambulatory Visit: Payer: BLUE CROSS/BLUE SHIELD | Admitting: *Deleted

## 2015-10-23 ENCOUNTER — Ambulatory Visit: Payer: BLUE CROSS/BLUE SHIELD

## 2015-10-23 DIAGNOSIS — E0841 Diabetes mellitus due to underlying condition with diabetic mononeuropathy: Secondary | ICD-10-CM

## 2015-10-24 NOTE — Progress Notes (Signed)
Patient ID: Troy [name], male   DOB: 09-Mar-1966, 50 y.o.   MRN: [id number] Patient presents to be scanned and measured for diabetic shoes and inserts.

## 2015-12-13 ENCOUNTER — Ambulatory Visit (INDEPENDENT_AMBULATORY_CARE_PROVIDER_SITE_OTHER): Payer: BLUE CROSS/BLUE SHIELD | Admitting: Podiatry

## 2015-12-13 DIAGNOSIS — E0841 Diabetes mellitus due to underlying condition with diabetic mononeuropathy: Secondary | ICD-10-CM

## 2015-12-13 DIAGNOSIS — M2042 Other hammer toe(s) (acquired), left foot: Secondary | ICD-10-CM

## 2015-12-13 DIAGNOSIS — L84 Corns and callosities: Secondary | ICD-10-CM | POA: Diagnosis not present

## 2015-12-13 DIAGNOSIS — E11610 Type 2 diabetes mellitus with diabetic neuropathic arthropathy: Secondary | ICD-10-CM | POA: Diagnosis not present

## 2015-12-13 NOTE — Progress Notes (Signed)
Patient ID: Troy [name], male   DOB: 1966/05/15, 50 y.o.   MRN: [id number] Patient presents for diabetic shoe pick up, shoes are tried on for good fit.  Patient received 1 Pair Orthofeet 650 Athletic Velcro triple strap white in men's size 13 extra wide and 3 pairs custom molded diabetic inserts.  Verbal and written break in and wear instructions given.  Patient will follow up for scheduled routine care.

## 2015-12-13 NOTE — Patient Instructions (Signed)

## 2016-01-31 ENCOUNTER — Ambulatory Visit (INDEPENDENT_AMBULATORY_CARE_PROVIDER_SITE_OTHER): Payer: BLUE CROSS/BLUE SHIELD

## 2016-01-31 ENCOUNTER — Encounter (HOSPITAL_COMMUNITY): Payer: Self-pay | Admitting: Emergency Medicine

## 2016-01-31 ENCOUNTER — Ambulatory Visit (HOSPITAL_COMMUNITY)
Admission: EM | Admit: 2016-01-31 | Discharge: 2016-01-31 | Disposition: A | Discharge status: Home / Self Care | Payer: BLUE CROSS/BLUE SHIELD | Attending: Emergency Medicine | Admitting: Emergency Medicine

## 2016-01-31 DIAGNOSIS — R509 Fever, unspecified: Secondary | ICD-10-CM

## 2016-01-31 LAB — POCT I-STAT, CHEM 8
BUN: 29 mg/dL — AB (ref 6–20)
CHLORIDE: 105 mmol/L (ref 101–111)
CREATININE: 1.2 mg/dL (ref 0.61–1.24)
Calcium, Ion: 1.17 mmol/L (ref 1.13–1.30)
GLUCOSE: 113 mg/dL — AB (ref 65–99)
HCT: 38 % — ABNORMAL LOW (ref 39.0–52.0)
Hemoglobin: 12.9 g/dL — ABNORMAL LOW (ref 13.0–17.0)
POTASSIUM: 5.3 mmol/L — AB (ref 3.5–5.1)
Sodium: 139 mmol/L (ref 135–145)
TCO2: 24 mmol/L (ref 0–100)

## 2016-01-31 LAB — POCT URINALYSIS DIP (DEVICE)
BILIRUBIN URINE: NEGATIVE
Glucose, UA: NEGATIVE mg/dL
HGB URINE DIPSTICK: NEGATIVE
Ketones, ur: NEGATIVE mg/dL
Leukocytes, UA: NEGATIVE
Nitrite: NEGATIVE
Protein, ur: 30 mg/dL — AB
SPECIFIC GRAVITY, URINE: 1.015 (ref 1.005–1.030)
UROBILINOGEN UA: 1 mg/dL (ref 0.0–1.0)
pH: 5.5 (ref 5.0–8.0)

## 2016-01-31 MED ORDER — AMOXICILLIN-POT CLAVULANATE 875-125 MG PO TABS: 1.0000 | ORAL_TABLET | Freq: Two times a day (BID) | ORAL | Status: DC

## 2016-01-31 NOTE — ED Provider Notes (Signed)
CSN: [id number]     Arrival date & time 01/31/16  1203 History   First MD Initiated Contact with Patient 01/31/16 1316     Chief Complaint  Patient presents with  . Fever   (Consider location/radiation/quality/duration/timing/severity/associated sxs/prior Treatment) HPI History obtained from patient: 50 year old male presents to the urgent care today with chief complaint of chills and not feeling well. Symptoms are been present for probably 12-18 hours. He states that they suddenly onset. He has had a slight cough. No dysuria or urinary problems. No sore throat. Has not been exposed to anyone this been ill. Has not had a tick bite. Home treatment has included Tylenol and ibuprofen with some relief of his symptoms. States that he has no pain at this point in time. Symptoms are constant.      Past Medical History  Diagnosis Date  . Hypertension   . Diabetes mellitus   . Gout   . Bronchitis   . Kidney stones 11/30/2013  . Asthma   . Neuromuscular disorder ([place])   . Obesity    Past Surgical History  Procedure Laterality Date  . No past surgeries    . Cholecystectomy N/A 12/03/2013    Procedure: LAPAROSCOPIC CHOLECYSTECTOMY WITH INTRAOPERATIVE CHOLANGIOGRAM;  Surgeon: Rolm Bookbinder, MD;  Location: [facility] OR;  Service: General;  Laterality: N/A;   Family History  Problem Relation Age of Onset  . Diabetes Mellitus II Father   . Diabetes Father   . Hypertension Father   . CAD Neg Hx   . Diabetes Mother   . Hypertension Mother    Social History  Substance Use Topics  . Smoking status: Never Smoker   . Smokeless tobacco: None  . Alcohol Use: Yes     Comment: occasionally    Review of Systems  Denies: HEADACHE, NAUSEA, ABDOMINAL PAIN, CHEST PAIN, CONGESTION, DYSURIA, SHORTNESS OF BREATH  Allergies  Review of patient's allergies indicates no known allergies.  Home Medications   Prior to Admission medications   Medication Sig Start Date End Date Taking? Authorizing Provider   glimepiride (AMARYL) 4 MG tablet Take 4 mg by mouth daily with breakfast.   Yes Historical Provider, MD  lisinopril-hydrochlorothiazide (PRINZIDE,ZESTORETIC) 10-12.5 MG tablet Take 1 tablet by mouth daily.  07/31/15  Yes Historical Provider, MD  metFORMIN (GLUCOPHAGE) 500 MG tablet Take 1,000 mg by mouth 2 (two) times daily.  06/29/14  Yes Historical Provider, MD  acetaminophen (TYLENOL) 500 MG tablet Take 1,000 mg by mouth every 6 (six) hours as needed for moderate pain.    Historical Provider, MD  albuterol (PROVENTIL HFA;VENTOLIN HFA) 108 (90 Base) MCG/ACT inhaler Inhale 1-2 puffs into the lungs every 6 (six) hours as needed for wheezing or shortness of breath. 08/25/15   Heather Laisure, PA-C  amoxicillin-clavulanate (AUGMENTIN) 875-125 MG tablet Take 1 tablet by mouth every 12 (twelve) hours. 01/31/16   Konrad Felix, PA  aspirin (ASPIRIN EC) 81 MG EC tablet Take 81 mg by mouth daily. Swallow whole.    Historical Provider, MD  BREO ELLIPTA 100-25 MCG/INH AEPB Inhale 1 puff into the lungs daily.  07/31/15   Historical Provider, MD  cetirizine (ZYRTEC) 10 MG tablet Take 10 mg by mouth daily.    Historical Provider, MD  cyclobenzaprine (FLEXERIL) 10 MG tablet Take 0.5-1 tablets (5-10 mg total) by mouth 2 (two) times daily as needed. 06/03/15   Tiffany Carlota Raspberry, PA-C  HYDROcodone-acetaminophen (NORCO) 10-325 MG tablet Take 1 tablet by mouth every 6 (six) hours as needed for severe  pain.  08/15/15   Historical Provider, MD  HYDROcodone-acetaminophen (NORCO/VICODIN) 5-325 MG tablet Take 1-2 tablets by mouth every 4 (four) hours as needed. 06/03/15   Tiffany Carlota Raspberry, PA-C  meloxicam (MOBIC) 7.5 MG tablet Take 1 tablet (7.5 mg total) by mouth daily as needed for pain (wrist pain). Patient taking differently: Take 7.5 mg by mouth daily.  02/13/14   Melony Overly, MD  predniSONE (DELTASONE) 20 MG tablet Take 3 tablets (60 mg total) by mouth daily. 08/25/15   Heather Laisure, PA-C  VIAGRA 100 MG tablet Take 100 mg by  mouth as needed for erectile dysfunction.  06/19/14   Historical Provider, MD   Meds Ordered and Administered this Visit  Medications - No data to display  BP 134/65 mmHg  Pulse 101  Temp(Src) 100.5 F (38.1 C) (Oral)  Resp 16  SpO2 99% No data found.   Physical Exam NURSES NOTES AND VITAL SIGNS REVIEWED. CONSTITUTIONAL: Well developed, well nourished, no acute distress HEENT: normocephalic, atraumatic EYES: Conjunctiva normal NECK:normal ROM, supple, no adenopathy PULMONARY:No respiratory distress, normal effort ABDOMINAL: Soft, ND, NT BS+, No CVAT MUSCULOSKELETAL: Normal ROM of all extremities,  SKIN: warm and dry without rash PSYCHIATRIC: Mood and affect, behavior are normal  ED Course  Procedures (including critical care time)  Labs Review Labs Reviewed  POCT URINALYSIS DIP (DEVICE) - Abnormal; Notable for the following:    Protein, ur 30 (*)    All other components within normal limits  POCT I-STAT, CHEM 8 - Abnormal; Notable for the following:    Potassium 5.3 (*)    BUN 29 (*)    Glucose, Bld 113 (*)    Hemoglobin 12.9 (*)    HCT 38.0 (*)    All other components within normal limits    Imaging Review Dg Chest 2 View  01/31/2016  CLINICAL DATA:  Shortness of breath with wheezing and fever EXAM: CHEST  2 VIEW COMPARISON:  Chest radiograph and chest CT August 25, 2015 FINDINGS: There is no apparent edema or consolidation. Heart size and pulmonary vascularity are normal. No adenopathy. There is degenerative change in the thoracic spine. IMPRESSION: No edema or consolidation. Electronically Signed   By: Lowella Grip III M.D.   On: 01/31/2016 14:20     Visual Acuity Review  Right Eye Distance:   Left Eye Distance:   Bilateral Distance:    Right Eye Near:   Left Eye Near:    Bilateral Near:     Chest x-ray and urinalysis are reviewed. Also i-STAT 8. Patient is not interested at this time to go to the hospital. He would like to go home and states that  he will go to the hospital if his symptoms worsen. Prescription for amoxicillin is provided to the patient.    MDM   1. Febrile illness, acute     Patient is reassured that there are no issues that require transfer to higher level of care at this time or additional tests. Patient is advised to continue home symptomatic treatment. Patient is advised that if there are new or worsening symptoms to attend the emergency department, contact primary care provider, or return to UC. Instructions of care provided discharged home in stable condition.    THIS NOTE WAS GENERATED USING A VOICE RECOGNITION SOFTWARE PROGRAM. ALL REASONABLE EFFORTS  WERE MADE TO PROOFREAD THIS DOCUMENT FOR ACCURACY.  I have verbally reviewed the discharge instructions with the patient. A printed AVS was given to the patient.  All questions were  answered prior to discharge.      Konrad Felix, [place] 01/31/16 1946

## 2016-01-31 NOTE — ED Notes (Signed)
Patient complains of chills, cough, runny nose, stuffy nose.  Denies nausea, denies vomiting, denies diarrhea.

## 2016-01-31 NOTE — Discharge Instructions (Signed)
Fever, Adult °A fever is an increase in the body's temperature. It is often defined as a temperature of 100° F (38°C) or higher. Short mild or moderate fevers often have no long-term effects. They also often do not need treatment. Moderate or high fevers may make you feel uncomfortable. Sometimes, they can also be a sign of a serious illness or disease. The sweating that may happen with repeated fevers or fevers that last a while may also cause you to not have enough fluid in your body (dehydration). °You can take your temperature with a thermometer to see if you have a fever. A measured temperature can change with: °· Age. °· Time of day. °· Where the thermometer is placed: °¨ Mouth (oral). °¨ Rectum (rectal). °¨ Ear (tympanic). °¨ Underarm (axillary). °¨ Forehead (temporal). °HOME CARE °Pay attention to any changes in your symptoms. Take these actions to help with your condition: °· Take over-the-counter and prescription medicines only as told by your doctor. Follow the dosing instructions carefully. °· If you were prescribed an antibiotic medicine, take it as told by your doctor. Do not stop taking the antibiotic even if you start to feel better. °· Rest as needed. °· Drink enough fluid to keep your pee (urine) clear or pale yellow. °· Sponge yourself or bathe with room-temperature water as needed. This helps to lower your body temperature . Do not use ice water. °· Do not wear too many blankets or heavy clothes. °GET HELP IF: °· You throw up (vomit). °· You cannot eat or drink without throwing up. °· You have watery poop (diarrhea). °· It hurts when you pee. °· Your symptoms do not get better with treatment. °· You have new symptoms. °· You feel very weak. °GET HELP RIGHT AWAY IF: °· You are short of breath or have trouble breathing. °· You are dizzy or you pass out (faint). °· You feel confused. °· You have signs of not having enough fluid in your body, such as: °¨ A dry mouth. °¨ Peeing less. °¨ Looking  pale. °· You have very bad pain in your belly (abdomen). °· You keep throwing up or having water poop. °· You have a skin rash. °· Your symptoms suddenly get worse. °  °This information is not intended to replace advice given to you by your health care provider. Make sure you discuss any questions you have with your health care provider. °  °Document Released: 04/22/2008 Document Revised: 04/04/2015 Document Reviewed: 09/07/2014 °Elsevier Interactive Patient Education ©2016 [employer] Inc[employer] ° °

## 2016-02-02 ENCOUNTER — Encounter (HOSPITAL_COMMUNITY): Payer: Self-pay

## 2016-02-02 ENCOUNTER — Inpatient Hospital Stay (HOSPITAL_COMMUNITY)
Admission: EM | Admit: 2016-02-02 | Discharge: 2016-02-09 | DRG: 617 | Disposition: A | Discharge status: Home Health | Payer: BLUE CROSS/BLUE SHIELD | Attending: Internal Medicine | Admitting: Internal Medicine

## 2016-02-02 ENCOUNTER — Emergency Department (HOSPITAL_COMMUNITY): Payer: BLUE CROSS/BLUE SHIELD

## 2016-02-02 DIAGNOSIS — E11628 Type 2 diabetes mellitus with other skin complications: Secondary | ICD-10-CM | POA: Insufficient documentation

## 2016-02-02 DIAGNOSIS — N182 Chronic kidney disease, stage 2 (mild): Secondary | ICD-10-CM | POA: Diagnosis not present

## 2016-02-02 DIAGNOSIS — L089 Local infection of the skin and subcutaneous tissue, unspecified: Secondary | ICD-10-CM

## 2016-02-02 DIAGNOSIS — M109 Gout, unspecified: Secondary | ICD-10-CM | POA: Diagnosis present

## 2016-02-02 DIAGNOSIS — E1169 Type 2 diabetes mellitus with other specified complication: Principal | ICD-10-CM | POA: Diagnosis present

## 2016-02-02 DIAGNOSIS — E669 Obesity, unspecified: Secondary | ICD-10-CM | POA: Diagnosis present

## 2016-02-02 DIAGNOSIS — M869 Osteomyelitis, unspecified: Secondary | ICD-10-CM | POA: Diagnosis present

## 2016-02-02 DIAGNOSIS — E1159 Type 2 diabetes mellitus with other circulatory complications: Secondary | ICD-10-CM

## 2016-02-02 DIAGNOSIS — E118 Type 2 diabetes mellitus with unspecified complications: Secondary | ICD-10-CM | POA: Diagnosis present

## 2016-02-02 DIAGNOSIS — Z7952 Long term (current) use of systemic steroids: Secondary | ICD-10-CM | POA: Diagnosis not present

## 2016-02-02 DIAGNOSIS — E11622 Type 2 diabetes mellitus with other skin ulcer: Secondary | ICD-10-CM | POA: Diagnosis present

## 2016-02-02 DIAGNOSIS — N183 Chronic kidney disease, stage 3 (moderate): Secondary | ICD-10-CM | POA: Diagnosis present

## 2016-02-02 DIAGNOSIS — I10 Essential (primary) hypertension: Secondary | ICD-10-CM | POA: Diagnosis not present

## 2016-02-02 DIAGNOSIS — L97529 Non-pressure chronic ulcer of other part of left foot with unspecified severity: Secondary | ICD-10-CM | POA: Diagnosis present

## 2016-02-02 DIAGNOSIS — E11621 Type 2 diabetes mellitus with foot ulcer: Secondary | ICD-10-CM | POA: Diagnosis present

## 2016-02-02 DIAGNOSIS — E1142 Type 2 diabetes mellitus with diabetic polyneuropathy: Secondary | ICD-10-CM | POA: Diagnosis present

## 2016-02-02 DIAGNOSIS — J45909 Unspecified asthma, uncomplicated: Secondary | ICD-10-CM | POA: Diagnosis present

## 2016-02-02 DIAGNOSIS — Z6841 Body Mass Index (BMI) 40.0 and over, adult: Secondary | ICD-10-CM

## 2016-02-02 DIAGNOSIS — D649 Anemia, unspecified: Secondary | ICD-10-CM | POA: Diagnosis present

## 2016-02-02 DIAGNOSIS — M86172 Other acute osteomyelitis, left ankle and foot: Secondary | ICD-10-CM | POA: Diagnosis present

## 2016-02-02 DIAGNOSIS — Z794 Long term (current) use of insulin: Secondary | ICD-10-CM

## 2016-02-02 DIAGNOSIS — I129 Hypertensive chronic kidney disease with stage 1 through stage 4 chronic kidney disease, or unspecified chronic kidney disease: Secondary | ICD-10-CM | POA: Diagnosis present

## 2016-02-02 DIAGNOSIS — E119 Type 2 diabetes mellitus without complications: Secondary | ICD-10-CM

## 2016-02-02 DIAGNOSIS — D500 Iron deficiency anemia secondary to blood loss (chronic): Secondary | ICD-10-CM | POA: Diagnosis not present

## 2016-02-02 DIAGNOSIS — E11610 Type 2 diabetes mellitus with diabetic neuropathic arthropathy: Secondary | ICD-10-CM | POA: Diagnosis present

## 2016-02-02 DIAGNOSIS — R609 Edema, unspecified: Secondary | ICD-10-CM

## 2016-02-02 DIAGNOSIS — D631 Anemia in chronic kidney disease: Secondary | ICD-10-CM | POA: Diagnosis present

## 2016-02-02 DIAGNOSIS — E1122 Type 2 diabetes mellitus with diabetic chronic kidney disease: Secondary | ICD-10-CM | POA: Diagnosis present

## 2016-02-02 DIAGNOSIS — E1022 Type 1 diabetes mellitus with diabetic chronic kidney disease: Secondary | ICD-10-CM | POA: Diagnosis not present

## 2016-02-02 HISTORY — DX: Type 2 diabetes mellitus with diabetic neuropathic arthropathy: E11.610

## 2016-02-02 HISTORY — DX: Chronic kidney disease, unspecified: N18.9

## 2016-02-02 HISTORY — DX: Type 2 diabetes mellitus with diabetic polyneuropathy: E11.42

## 2016-02-02 LAB — CBC WITH DIFFERENTIAL/PLATELET
BASOS ABS: 0 10*3/uL (ref 0.0–0.1)
BASOS PCT: 0 %
EOS ABS: 0.5 10*3/uL (ref 0.0–0.7)
Eosinophils Relative: 3 %
HCT: 32.1 % — ABNORMAL LOW (ref 39.0–52.0)
HEMOGLOBIN: 10.7 g/dL — AB (ref 13.0–17.0)
LYMPHS ABS: 2.7 10*3/uL (ref 0.7–4.0)
Lymphocytes Relative: 16 %
MCH: 28.1 pg (ref 26.0–34.0)
MCHC: 33.3 g/dL (ref 30.0–36.0)
MCV: 84.3 fL (ref 78.0–100.0)
MONO ABS: 2 10*3/uL — AB (ref 0.1–1.0)
Monocytes Relative: 12 %
NEUTROS ABS: 11.6 10*3/uL — AB (ref 1.7–7.7)
Neutrophils Relative %: 69 %
Platelets: 256 10*3/uL (ref 150–400)
RBC: 3.81 MIL/uL — ABNORMAL LOW (ref 4.22–5.81)
RDW: 15 % (ref 11.5–15.5)
WBC: 16.8 10*3/uL — ABNORMAL HIGH (ref 4.0–10.5)

## 2016-02-02 LAB — COMPREHENSIVE METABOLIC PANEL
ALBUMIN: 2.8 g/dL — AB (ref 3.5–5.0)
ALK PHOS: 88 U/L (ref 38–126)
ALT: 32 U/L (ref 17–63)
AST: 28 U/L (ref 15–41)
Anion gap: 8 (ref 5–15)
BILIRUBIN TOTAL: 0.6 mg/dL (ref 0.3–1.2)
BUN: 21 mg/dL — AB (ref 6–20)
CALCIUM: 9 mg/dL (ref 8.9–10.3)
CO2: 21 mmol/L — ABNORMAL LOW (ref 22–32)
CREATININE: 1.57 mg/dL — AB (ref 0.61–1.24)
Chloride: 105 mmol/L (ref 101–111)
GFR calc Af Amer: 57 mL/min — ABNORMAL LOW (ref >60)
GFR, EST NON AFRICAN AMERICAN: 49 mL/min — AB (ref >60)
GLUCOSE: 153 mg/dL — AB (ref 65–99)
Potassium: 4.3 mmol/L (ref 3.5–5.1)
Sodium: 134 mmol/L — ABNORMAL LOW (ref 135–145)
TOTAL PROTEIN: 7.8 g/dL (ref 6.5–8.1)

## 2016-02-02 LAB — URINE MICROSCOPIC-ADD ON

## 2016-02-02 LAB — URINALYSIS, ROUTINE W REFLEX MICROSCOPIC
BILIRUBIN URINE: NEGATIVE
Glucose, UA: NEGATIVE mg/dL
Hgb urine dipstick: NEGATIVE
Ketones, ur: 15 mg/dL — AB
LEUKOCYTES UA: NEGATIVE
NITRITE: NEGATIVE
PH: 5 (ref 5.0–8.0)
Protein, ur: 30 mg/dL — AB
SPECIFIC GRAVITY, URINE: 1.026 (ref 1.005–1.030)

## 2016-02-02 LAB — RETICULOCYTES
RBC.: 3.65 MIL/uL — ABNORMAL LOW (ref 4.22–5.81)
RETIC CT PCT: 0.8 % (ref 0.4–3.1)
Retic Count, Absolute: 29.2 10*3/uL (ref 19.0–186.0)

## 2016-02-02 LAB — GLUCOSE, CAPILLARY
GLUCOSE-CAPILLARY: 129 mg/dL — AB (ref 65–99)
Glucose-Capillary: 132 mg/dL — ABNORMAL HIGH (ref 65–99)

## 2016-02-02 LAB — CREATININE, SERUM: CREATININE: 1.3 mg/dL — AB (ref 0.61–1.24)

## 2016-02-02 LAB — IRON AND TIBC
IRON: 19 ug/dL — AB (ref 45–182)
Saturation Ratios: 11 % — ABNORMAL LOW (ref 17.9–39.5)
TIBC: 172 ug/dL — ABNORMAL LOW (ref 250–450)
UIBC: 153 ug/dL

## 2016-02-02 LAB — VITAMIN B12: Vitamin B-12: 877 pg/mL (ref 180–914)

## 2016-02-02 LAB — I-STAT CG4 LACTIC ACID, ED
LACTIC ACID, VENOUS: 1.61 mmol/L (ref 0.5–1.9)
LACTIC ACID, VENOUS: 1.17 mmol/L (ref 0.5–1.9)

## 2016-02-02 LAB — FOLATE: Folate: 34.5 ng/mL (ref >5.9)

## 2016-02-02 LAB — FERRITIN: FERRITIN: 1786 ng/mL — AB (ref 24–336)

## 2016-02-02 MED ORDER — ONDANSETRON HCL 4 MG/2ML IJ SOLN: 4.0000 mg | Freq: Three times a day (TID) | INTRAMUSCULAR | Status: DC | PRN

## 2016-02-02 MED ORDER — ACETAMINOPHEN 650 MG RE SUPP
650.0000 mg | Freq: Four times a day (QID) | RECTAL | Status: DC | PRN
Start: 2016-02-02 — End: 2016-02-06

## 2016-02-02 MED ORDER — HYDROMORPHONE HCL 1 MG/ML IJ SOLN: 0.5000 mg | INTRAMUSCULAR | Status: DC | PRN

## 2016-02-02 MED ORDER — VANCOMYCIN HCL 10 G IV SOLR
1500.0000 mg | INTRAVENOUS | Status: AC
  Administered 2016-02-03 – 2016-02-08 (×6): 1500 mg via INTRAVENOUS
  Filled 2016-02-02 (×6): qty 1500

## 2016-02-02 MED ORDER — PIPERACILLIN-TAZOBACTAM 3.375 G IVPB
3.3750 g | Freq: Three times a day (TID) | INTRAVENOUS | Status: AC
  Administered 2016-02-02 – 2016-02-08 (×18): 3.375 g via INTRAVENOUS
  Filled 2016-02-02 (×19): qty 50

## 2016-02-02 MED ORDER — MELOXICAM 7.5 MG PO TABS: 7.5000 mg | ORAL_TABLET | Freq: Every day | ORAL | Status: DC

## 2016-02-02 MED ORDER — ONDANSETRON HCL 4 MG PO TABS: 4.0000 mg | ORAL_TABLET | Freq: Four times a day (QID) | ORAL | Status: DC | PRN

## 2016-02-02 MED ORDER — INSULIN GLARGINE 100 UNIT/ML ~~LOC~~ SOLN
10.0000 [IU] | Freq: Every day | SUBCUTANEOUS | Status: DC
  Administered 2016-02-02 – 2016-02-08 (×7): 10 [IU] via SUBCUTANEOUS
  Filled 2016-02-02 (×8): qty 0.1

## 2016-02-02 MED ORDER — BISACODYL 10 MG RE SUPP
10.0000 mg | Freq: Every day | RECTAL | Status: DC | PRN
Start: 2016-02-02 — End: 2016-02-09

## 2016-02-02 MED ORDER — PREDNISONE 20 MG PO TABS: 60.0000 mg | ORAL_TABLET | Freq: Every day | ORAL | Status: DC

## 2016-02-02 MED ORDER — SODIUM CHLORIDE 0.9 % IV SOLN
INTRAVENOUS | Status: AC
  Administered 2016-02-02: 75 mL/h via INTRAVENOUS

## 2016-02-02 MED ORDER — ACETAMINOPHEN 325 MG PO TABS
650.0000 mg | ORAL_TABLET | Freq: Four times a day (QID) | ORAL | Status: DC | PRN
Start: 2016-02-02 — End: 2016-02-06

## 2016-02-02 MED ORDER — INSULIN ASPART 100 UNIT/ML ~~LOC~~ SOLN: 0.0000 [IU] | Freq: Every day | SUBCUTANEOUS | Status: DC

## 2016-02-02 MED ORDER — VANCOMYCIN HCL 10 G IV SOLR
2500.0000 mg | Freq: Once | INTRAVENOUS | Status: AC
  Administered 2016-02-02: 2500 mg via INTRAVENOUS
  Filled 2016-02-02: qty 2500

## 2016-02-02 MED ORDER — PIPERACILLIN-TAZOBACTAM 3.375 G IVPB 30 MIN
3.3750 g | Freq: Once | INTRAVENOUS | Status: AC
  Administered 2016-02-02: 3.375 g via INTRAVENOUS
  Filled 2016-02-02: qty 50

## 2016-02-02 MED ORDER — FLUTICASONE FUROATE-VILANTEROL 100-25 MCG/INH IN AEPB
1.0000 | INHALATION_SPRAY | Freq: Every day | RESPIRATORY_TRACT | Status: DC
  Administered 2016-02-03 – 2016-02-09 (×6): 1 via RESPIRATORY_TRACT
  Filled 2016-02-02: qty 28

## 2016-02-02 MED ORDER — ONDANSETRON HCL 4 MG/2ML IJ SOLN
4.0000 mg | Freq: Four times a day (QID) | INTRAMUSCULAR | Status: DC | PRN
  Administered 2016-02-06: 4 mg via INTRAVENOUS

## 2016-02-02 MED ORDER — ASPIRIN 81 MG PO TBEC: 81.0000 mg | DELAYED_RELEASE_TABLET | Freq: Every day | ORAL | Status: DC

## 2016-02-02 MED ORDER — HEPARIN SODIUM (PORCINE) 5000 UNIT/ML IJ SOLN
5000.0000 [IU] | Freq: Three times a day (TID) | INTRAMUSCULAR | Status: DC
  Administered 2016-02-02 – 2016-02-03 (×3): 5000 [IU] via SUBCUTANEOUS
  Filled 2016-02-02 (×2): qty 1

## 2016-02-02 MED ORDER — ASPIRIN EC 81 MG PO TBEC
81.0000 mg | DELAYED_RELEASE_TABLET | Freq: Every day | ORAL | Status: DC
  Administered 2016-02-03 – 2016-02-09 (×6): 81 mg via ORAL
  Filled 2016-02-02 (×7): qty 1

## 2016-02-02 MED ORDER — ALBUTEROL SULFATE (2.5 MG/3ML) 0.083% IN NEBU: 2.5000 mg | INHALATION_SOLUTION | Freq: Four times a day (QID) | RESPIRATORY_TRACT | Status: DC | PRN

## 2016-02-02 MED ORDER — TRAMADOL HCL 50 MG PO TABS: 50.0000 mg | ORAL_TABLET | Freq: Four times a day (QID) | ORAL | Status: DC | PRN

## 2016-02-02 MED ORDER — INSULIN ASPART 100 UNIT/ML ~~LOC~~ SOLN
0.0000 [IU] | Freq: Three times a day (TID) | SUBCUTANEOUS | Status: DC
  Administered 2016-02-02 – 2016-02-03 (×2): 2 [IU] via SUBCUTANEOUS
  Administered 2016-02-03 – 2016-02-04 (×2): 3 [IU] via SUBCUTANEOUS
  Administered 2016-02-04 – 2016-02-05 (×2): 2 [IU] via SUBCUTANEOUS
  Administered 2016-02-05: 3 [IU] via SUBCUTANEOUS
  Administered 2016-02-05: 2 [IU] via SUBCUTANEOUS
  Administered 2016-02-07 (×2): 3 [IU] via SUBCUTANEOUS
  Administered 2016-02-07: 2 [IU] via SUBCUTANEOUS
  Administered 2016-02-08: 11 [IU] via SUBCUTANEOUS
  Administered 2016-02-08: 3 [IU] via SUBCUTANEOUS

## 2016-02-02 MED ORDER — PIPERACILLIN-TAZOBACTAM 3.375 G IVPB: 3.3750 g | Freq: Three times a day (TID) | INTRAVENOUS | Status: DC

## 2016-02-02 MED ORDER — HYDROCODONE-ACETAMINOPHEN 5-325 MG PO TABS
1.0000 | ORAL_TABLET | ORAL | Status: DC | PRN
  Administered 2016-02-04: 2 via ORAL
  Administered 2016-02-04: 1 via ORAL
  Administered 2016-02-04 – 2016-02-05 (×5): 2 via ORAL
  Filled 2016-02-02: qty 2
  Filled 2016-02-02: qty 1
  Filled 2016-02-02 (×5): qty 2

## 2016-02-02 MED ORDER — INSULIN GLARGINE 100 UNIT/ML ~~LOC~~ SOLN
20.0000 [IU] | Freq: Every day | SUBCUTANEOUS | Status: DC
Start: 2016-02-02 — End: 2016-02-02
  Filled 2016-02-02: qty 0.2

## 2016-02-02 MED ORDER — ALBUTEROL SULFATE HFA 108 (90 BASE) MCG/ACT IN AERS: 1.0000 | INHALATION_SPRAY | Freq: Four times a day (QID) | RESPIRATORY_TRACT | Status: DC | PRN

## 2016-02-02 NOTE — ED Notes (Addendum)
Pt was seen a few days ago for cold symptoms and also rt. Foot diabetic ulcer, that is draining and has an odor.  Pt. Reports that they started him on amoxicillin but he feels the same. Rt. Foot is swollen.  Alert and oriented X3. Skin is warm and dry

## 2016-02-02 NOTE — H&P (Signed)
History and Physical    Troy [name] [id number] DOB: [birth year] DOA: 02/02/2016  PCP: Kandice Hams, MD Patient coming from: home  Chief Complaint: wound infection  HPI: Troy [name] is a very pleasant 50 y.o. male with medical history significant for diabetes, hypertension,charcot foot presents to the emergency department with the chief complaint of wound infection. Initial evaluation concerning for osteomyelitis left foot.  Information is obtained from the patient. He states that he developed worsening swelling in that left foot several days ago. Yesterday he went to urgent care and was given amoxicillin for complaints of cold symptoms. This morning he woke up and noted purulent drainage from the left foot. He reports no history of diabetic ulcers and has chronic edema. Associated symptoms include chills subjective fever general malaise decreased appetite. He denies headache dizziness syncope or near-syncope. He endorses intermittent nausea but denies emesis. He denies chest pain shortness of breath coughing diarrhea constipation melena or bright red blood per rectum. He denies dysuria hematuria frequency or urgency. He does report severe neuropathy therefore denies any pain in his foot.    ED Course: The emergency department he is provided with vancomycin and Zosyn. He has max temp of 99.3 hemodynamically stable and not hypoxic and nontoxic appearing  Review of Systems: As per HPI otherwise 10 point review of systems negative.   Ambulatory Status: Ambulates independently with steady gait  Past Medical History  Diagnosis Date  . Hypertension   . Diabetes mellitus   . Gout   . Bronchitis   . Kidney stones 11/30/2013  . Asthma   . Neuromuscular disorder ([place])   . Obesity   . CKD (chronic kidney disease)     Past Surgical History  Procedure Laterality Date  . No past surgeries    . Cholecystectomy N/A 12/03/2013    Procedure: LAPAROSCOPIC CHOLECYSTECTOMY WITH  INTRAOPERATIVE CHOLANGIOGRAM;  Surgeon: Rolm Bookbinder, MD;  Location: [place];  Service: General;  Laterality: N/A;    Social History   Social History  . Marital Status: Married    Spouse Name: N/A  . Number of Children: N/A  . Years of Education: N/A   Occupational History  . Not on file.   Social History Main Topics  . Smoking status: Never Smoker   . Smokeless tobacco: Not on file  . Alcohol Use: Yes     Comment: occasionally  . Drug Use: No  . Sexual Activity: Not on file   Other Topics Concern  . Not on file   Social History Narrative   He lives at home with his wife he is employed at [employer] independent with ADLs No Known Allergies  Family History  Problem Relation Age of Onset  . Diabetes Mellitus II Father   . Diabetes Father   . Hypertension Father   . CAD Neg Hx   . Diabetes Mother   . Hypertension Mother     Prior to Admission medications   Medication Sig Start Date End Date Taking? Authorizing Provider  acetaminophen (TYLENOL) 500 MG tablet Take 1,000 mg by mouth every 6 (six) hours as needed for moderate pain.   Yes Historical Provider, MD  albuterol (PROVENTIL HFA;VENTOLIN HFA) 108 (90 Base) MCG/ACT inhaler Inhale 1-2 puffs into the lungs every 6 (six) hours as needed for wheezing or shortness of breath. 08/25/15  Yes Heather Laisure, PA-C  amoxicillin-clavulanate (AUGMENTIN) 875-125 MG tablet Take 1 tablet by mouth every 12 (twelve) hours. 01/31/16  Yes Konrad Felix, PA  BREO ELLIPTA  100-25 MCG/INH AEPB Inhale 1 puff into the lungs daily.  07/31/15  Yes Historical Provider, MD  cetirizine (ZYRTEC) 10 MG tablet Take 10 mg by mouth daily.   Yes Historical Provider, MD  cyclobenzaprine (FLEXERIL) 10 MG tablet Take 0.5-1 tablets (5-10 mg total) by mouth 2 (two) times daily as needed. 06/03/15  Yes Tiffany Carlota Raspberry, PA-C  glimepiride (AMARYL) 4 MG tablet Take 4 mg by mouth daily with breakfast.   Yes Historical Provider, MD  LANTUS SOLOSTAR 100 UNIT/ML  Solostar Pen Inject 10 Units into the skin daily. 01/10/16  Yes Historical Provider, MD  lisinopril-hydrochlorothiazide (PRINZIDE,ZESTORETIC) 10-12.5 MG tablet Take 1 tablet by mouth daily.  07/31/15  Yes Historical Provider, MD  metFORMIN (GLUCOPHAGE) 1000 MG tablet Take 1 tablet by mouth 2 (two) times daily. 01/28/16  Yes Historical Provider, MD  metFORMIN (GLUCOPHAGE) 500 MG tablet Take 1,000 mg by mouth 2 (two) times daily.  06/29/14  Yes Historical Provider, MD  traMADol (ULTRAM) 50 MG tablet Take 1 tablet by mouth every 6 (six) hours as needed. pain 11/14/15  Yes Historical Provider, MD  VIAGRA 100 MG tablet Take 100 mg by mouth as needed for erectile dysfunction.  06/19/14  Yes Historical Provider, MD  aspirin (ASPIRIN EC) 81 MG EC tablet Take 81 mg by mouth daily. Swallow whole.    Historical Provider, MD  HYDROcodone-acetaminophen (NORCO/VICODIN) 5-325 MG tablet Take 1-2 tablets by mouth every 4 (four) hours as needed. 06/03/15   Tiffany Carlota Raspberry, PA-C  meloxicam (MOBIC) 7.5 MG tablet Take 1 tablet (7.5 mg total) by mouth daily as needed for pain (wrist pain). Patient taking differently: Take 7.5 mg by mouth daily.  02/13/14   Melony Overly, MD  predniSONE (DELTASONE) 20 MG tablet Take 3 tablets (60 mg total) by mouth daily. 08/25/15   Hyman Bible, PA-C    Physical Exam: Filed Vitals:   02/02/16 1245 02/02/16 1300 02/02/16 1445 02/02/16 1452  BP: 111/62 116/67 128/85   Pulse: 90 98 88 91  Temp:      TempSrc:      Resp: 19 25  20   Height:      Weight:      SpO2: 95% 96% 100% 96%     General:  Appears calm and comfortable, obese no acute distress Eyes:  PERRL, EOMI, normal lids, iris ENT:  grossly normal hearing, lips & tongue, weakness membranes of his mouth are pink slightly dry Neck:  no LAD, masses or thyromegaly Cardiovascular:  RRR, no m/r/g. Trace LE edema.  Respiratory:  CTA bilaterally, no w/r/r. Normal respiratory effort. Abdomen:  soft, ntnd, positive bowel sounds throughout  no guarding or rebounding Skin:  no rash or induration seen on limited exam Musculoskeletal:  grossly normal tone BUE/BLE, good ROM, foot with 6 cm diameter raised fluctuant area base of great toe no erythema no heat no drainage appears to have a healing scab in the center, 2 cm ulcer with purulent drainage middle of underside foot, lateral aspect left foot with what appears to be an open blister draining thin pink fluid Psychiatric:  grossly normal mood and affect, speech fluent and appropriate, AOx3 Neurologic:  CN 2-12 grossly intact, moves all extremities in coordinated fashion, sensation intact  Labs on Admission: I have personally reviewed following labs and imaging studies  CBC:  Recent Labs Lab 01/31/16 1408 02/02/16 1218  WBC  --  16.8*  NEUTROABS  --  11.6*  HGB 12.9* 10.7*  HCT 38.0* 32.1*  MCV  --  84.3  PLT  --  123456   Basic Metabolic Panel:  Recent Labs Lab 01/31/16 1408 02/02/16 1218  NA 139 134*  K 5.3* 4.3  CL 105 105  CO2  --  21*  GLUCOSE 113* 153*  BUN 29* 21*  CREATININE 1.20 1.57*  CALCIUM  --  9.0   GFR: Estimated Creatinine Clearance: 74.3 mL/min (by C-G formula based on Cr of 1.57). Liver Function Tests:  Recent Labs Lab 02/02/16 1218  AST 28  ALT 32  ALKPHOS 88  BILITOT 0.6  PROT 7.8  ALBUMIN 2.8*   No results for input(s): LIPASE, AMYLASE in the last 168 hours. No results for input(s): AMMONIA in the last 168 hours. Coagulation Profile: No results for input(s): INR, PROTIME in the last 168 hours. Cardiac Enzymes: No results for input(s): CKTOTAL, CKMB, CKMBINDEX, TROPONINI in the last 168 hours. BNP (last 3 results) No results for input(s): PROBNP in the last 8760 hours. HbA1C: No results for input(s): HGBA1C in the last 72 hours. CBG: No results for input(s): GLUCAP in the last 168 hours. Lipid Profile: No results for input(s): CHOL, HDL, LDLCALC, TRIG, CHOLHDL, LDLDIRECT in the last 72 hours. Thyroid Function Tests: No  results for input(s): TSH, T4TOTAL, FREET4, T3FREE, THYROIDAB in the last 72 hours. Anemia Panel: No results for input(s): VITAMINB12, FOLATE, FERRITIN, TIBC, IRON, RETICCTPCT in the last 72 hours. Urine analysis:    Component Value Date/Time   COLORURINE YELLOW 02/02/2016 1329   APPEARANCEUR CLEAR 02/02/2016 1329   LABSPEC 1.026 02/02/2016 1329   PHURINE 5.0 02/02/2016 1329   GLUCOSEU NEGATIVE 02/02/2016 1329   HGBUR NEGATIVE 02/02/2016 1329   BILIRUBINUR NEGATIVE 02/02/2016 1329   KETONESUR 15* 02/02/2016 1329   PROTEINUR 30* 02/02/2016 1329   UROBILINOGEN 1.0 01/31/2016 1405   NITRITE NEGATIVE 02/02/2016 1329   LEUKOCYTESUR NEGATIVE 02/02/2016 1329    Creatinine Clearance: Estimated Creatinine Clearance: 74.3 mL/min (by C-G formula based on Cr of 1.57).  Sepsis Labs: @LABRCNTIP (procalcitonin:4,lacticidven:4) )No results found for this or any previous visit (from the past 240 hour(s)).   Radiological Exams on Admission: Dg Chest 2 View  02/02/2016  CLINICAL DATA:  Cough EXAM: CHEST  2 VIEW COMPARISON:  01/31/2016 chest radiograph. FINDINGS: Stable cardiomediastinal silhouette with normal heart size. No pneumothorax. No pleural effusion. Lungs appear clear, with no acute consolidative airspace disease and no pulmonary edema. IMPRESSION: No active cardiopulmonary disease. Electronically Signed   By: Ilona Sorrel M.D.   On: 02/02/2016 11:27   Dg Foot Complete Left  02/02/2016  CLINICAL DATA:  Ulcers along foot.  Charcot foot. EXAM: LEFT FOOT - COMPLETE 3+ VIEW COMPARISON:  Plain films LEFT foot, 08/17/2015 FINDINGS: Compared to exam of 08/17/2015, there is interval osseous erosion at the base of the fifth, fourth, third, and second metatarsals. There is bone loss within the tarsal bones with noticeable bone loss in cuneiforms. There is a robust periosteal reaction along the shafts of the metatarsals with dense dense sclerosis of the metatarsals proximal. There is interval increase in  soft tissue swelling throughout the midfoot. Small amount subcutaneous gas along the plantar surface. IMPRESSION: Interval severe osseous erosion involving the proximal metatarsals and tarsal bones along the metatarsal tarsal joints. Interval marked soft tissue swelling. Differential includes aggressive erosive arthropathy versus OSTEOMYELITIS. Favor severe osteomyelitis. Electronically Signed   By: Suzy Bouchard M.D.   On: 02/02/2016 13:34    EKG: Independently reviewed. Sinus rhythm Low voltage, precordial leads Probable anteroseptal infarct, old  Assessment/Plan Principal Problem:  Osteomyelitis of foot, acute, left Active Problems:   Hypertension   Diabetes ([place])   Diabetic foot ([facility])   Osteomyelitis ([facility])   Diabetic foot infection ([place])   CKD (chronic kidney disease), stage II   Anemia   1. Osteomyelitis of foot acute left. X-ray reveals severe osseous erosion involving proximal metatarsals and tarsal bones soft tissue swelling inserting for severe osteomyelitis. Temperature 99.3, lactic acid within the limits of normal he seemed hemodynamically stable and nontoxic appearing -Continue Vanco and Zosyn initiated in the emergency department -Follow blood cultures -Orthopedic consult -Supportive therapy  #2. Diabetes. Serum glucose 153 on admission. Home meds include Lantus Amaryl metformin. -Continue Lantus -Hold oral agents -Sliding scale insulin for optimal control -Obtain a hemoglobin A1c  3. Hypertension. Controlled. Home medications no antihypertensives. -Monitor -Consider low-dose ACE inhibitor  #4. Chronic kidney disease. creatinine 1.5 on admission. Chart review indicates this is close to baseline. Likely related to chronic illness -Monitor urine output -Hold nephrotoxins -Gentle IV fluids -Recheck in the morning  #5. Anemia. Likely related to chronic illness. Hg 10.7. Chart review indicates hemoglobin 12.92 days ago and 12.7 one year ago. Today's level likely  dilutional. No signs symptoms of active bleeding -Recheck in the morning -Anemia panel     DVT prophylaxis: lovenox Code Status: full  Family Communication: wife  Disposition Plan: home  Consults called: inpatient  Admission status: inpatient   Radene Gunning MD [facility]  If 7PM-7AM, please contact night-coverage [email or web address] Password [facility]  02/02/2016, 3:33 PM

## 2016-02-02 NOTE — ED Provider Notes (Signed)
CSN: [id number]     Arrival date & time 02/02/16  1049 History   First MD Initiated Contact with Patient 02/02/16 1136     Chief Complaint  Patient presents with  . Wound Infection     (Consider location/radiation/quality/duration/timing/severity/associated sxs/prior Treatment) HPI Comments: Patient is a 50 year old male with history of diabetes and Charcot foot who presents with onset of fever and cold symptoms yesterday. Patient woke up this morning with purulent drainage from his L foot. He is unsure how long the ulcers were present. Patient has no history of diabetic ulcers, however he does have the diagnosis of Charcot foot and does have baseline edema to his left foot. Patient reports one episode of lightheadedness in the shower 2 days ago. Patient was seen in urgent care yesterday and given amoxicillin for his cold symptoms that began yesterday. Patient denies any pain to his foot, he does have a history of severe neuropathy. Patient denies any chest pain, shortness of breath, abdominal pain, nausea, vomiting, dysuria.  The history is provided by the patient.    Past Medical History  Diagnosis Date  . Hypertension   . Diabetes mellitus   . Gout   . Bronchitis   . Kidney stones 11/30/2013  . Asthma   . Neuromuscular disorder ([place])   . Obesity   . CKD (chronic kidney disease)   . Diabetic Charcot foot [facility])    Past Surgical History  Procedure Laterality Date  . No past surgeries    . Cholecystectomy N/A 12/03/2013    Procedure: LAPAROSCOPIC CHOLECYSTECTOMY WITH INTRAOPERATIVE CHOLANGIOGRAM;  Surgeon: Rolm Bookbinder, MD;  Location: [facility] OR;  Service: General;  Laterality: N/A;   Family History  Problem Relation Age of Onset  . Diabetes Mellitus II Father   . Diabetes Father   . Hypertension Father   . CAD Neg Hx   . Diabetes Mother   . Hypertension Mother    Social History  Substance Use Topics  . Smoking status: Never Smoker   . Smokeless tobacco: None  . Alcohol Use:  Yes     Comment: occasionally    Review of Systems  Constitutional: Negative for fever and chills.  HENT: Negative for facial swelling and sore throat.   Respiratory: Negative for shortness of breath.   Cardiovascular: Negative for chest pain.  Gastrointestinal: Negative for nausea, vomiting and abdominal pain.  Genitourinary: Negative for dysuria.  Musculoskeletal: Negative for back pain.  Skin: Positive for wound (L foot). Negative for rash.  Neurological: Negative for headaches.  Psychiatric/Behavioral: The patient is not nervous/anxious.       Allergies  Review of patient's allergies indicates no known allergies.  Home Medications   Prior to Admission medications   Medication Sig Start Date End Date Taking? Authorizing Provider  acetaminophen (TYLENOL) 500 MG tablet Take 1,000 mg by mouth every 6 (six) hours as needed for moderate pain.   Yes Historical Provider, MD  albuterol (PROVENTIL HFA;VENTOLIN HFA) 108 (90 Base) MCG/ACT inhaler Inhale 1-2 puffs into the lungs every 6 (six) hours as needed for wheezing or shortness of breath. 08/25/15  Yes Heather Laisure, PA-C  amoxicillin-clavulanate (AUGMENTIN) 875-125 MG tablet Take 1 tablet by mouth every 12 (twelve) hours. 01/31/16  Yes Konrad Felix, PA  BREO ELLIPTA 100-25 MCG/INH AEPB Inhale 1 puff into the lungs daily.  07/31/15  Yes Historical Provider, MD  cetirizine (ZYRTEC) 10 MG tablet Take 10 mg by mouth daily.   Yes Historical Provider, MD  cyclobenzaprine (FLEXERIL) 10 MG  tablet Take 0.5-1 tablets (5-10 mg total) by mouth 2 (two) times daily as needed. 06/03/15  Yes Tiffany Carlota Raspberry, PA-C  glimepiride (AMARYL) 4 MG tablet Take 4 mg by mouth daily with breakfast.   Yes Historical Provider, MD  LANTUS SOLOSTAR 100 UNIT/ML Solostar Pen Inject 10 Units into the skin daily. 01/10/16  Yes Historical Provider, MD  lisinopril-hydrochlorothiazide (PRINZIDE,ZESTORETIC) 10-12.5 MG tablet Take 1 tablet by mouth daily.  07/31/15  Yes  Historical Provider, MD  metFORMIN (GLUCOPHAGE) 1000 MG tablet Take 1 tablet by mouth 2 (two) times daily. 01/28/16  Yes Historical Provider, MD  metFORMIN (GLUCOPHAGE) 500 MG tablet Take 1,000 mg by mouth 2 (two) times daily.  06/29/14  Yes Historical Provider, MD  traMADol (ULTRAM) 50 MG tablet Take 1 tablet by mouth every 6 (six) hours as needed. pain 11/14/15  Yes Historical Provider, MD  VIAGRA 100 MG tablet Take 100 mg by mouth as needed for erectile dysfunction.  06/19/14  Yes Historical Provider, MD  aspirin (ASPIRIN EC) 81 MG EC tablet Take 81 mg by mouth daily. Swallow whole.    Historical Provider, MD  HYDROcodone-acetaminophen (NORCO/VICODIN) 5-325 MG tablet Take 1-2 tablets by mouth every 4 (four) hours as needed. 06/03/15   Tiffany Carlota Raspberry, PA-C  meloxicam (MOBIC) 7.5 MG tablet Take 1 tablet (7.5 mg total) by mouth daily as needed for pain (wrist pain). Patient taking differently: Take 7.5 mg by mouth daily.  02/13/14   Melony Overly, MD  predniSONE (DELTASONE) 20 MG tablet Take 3 tablets (60 mg total) by mouth daily. 08/25/15   Heather Laisure, PA-C   BP 128/85 mmHg  Pulse 91  Temp(Src) 99.3 F (37.4 C) (Oral)  Resp 20  Ht 5\' 10"  (1.778 m)  Wt 129.275 kg  BMI 40.89 kg/m2  SpO2 96% Physical Exam  Constitutional: He appears well-developed and well-nourished. No distress.  HENT:  Head: Normocephalic and atraumatic.  Mouth/Throat: Oropharynx is clear and moist. No oropharyngeal exudate.  Eyes: Conjunctivae are normal. Pupils are equal, round, and reactive to light. Right eye exhibits no discharge. Left eye exhibits no discharge. No scleral icterus.  Neck: Normal range of motion. Neck supple. No thyromegaly present.  Cardiovascular: Normal rate, regular rhythm, normal heart sounds and intact distal pulses.  Exam reveals no gallop and no friction rub.   No murmur heard. Pulmonary/Chest: Effort normal and breath sounds normal. No stridor. No respiratory distress. He has no wheezes. He has  no rales.  Abdominal: Soft. Bowel sounds are normal. He exhibits no distension. There is no tenderness. There is no rebound and no guarding.  Musculoskeletal: He exhibits no edema.       Feet:  Extreme edema to L foot  Lymphadenopathy:    He has no cervical adenopathy.  Neurological: He is alert. Coordination normal.  Little to no sensation to light touch or palpation of L foot  Skin: Skin is warm and dry. No rash noted. He is not diaphoretic. No pallor.  Psychiatric: He has a normal mood and affect.  Nursing note and vitals reviewed.   ED Course  Procedures (including critical care time) Labs Review Labs Reviewed  COMPREHENSIVE METABOLIC PANEL - Abnormal; Notable for the following:    Sodium 134 (*)    CO2 21 (*)    Glucose, Bld 153 (*)    BUN 21 (*)    Creatinine, Ser 1.57 (*)    Albumin 2.8 (*)    GFR calc non Af Amer 49 (*)    GFR  calc Af Amer 57 (*)    All other components within normal limits  CBC WITH DIFFERENTIAL/PLATELET - Abnormal; Notable for the following:    WBC 16.8 (*)    RBC 3.81 (*)    Hemoglobin 10.7 (*)    HCT 32.1 (*)    Neutro Abs 11.6 (*)    Monocytes Absolute 2.0 (*)    All other components within normal limits  URINALYSIS, ROUTINE W REFLEX MICROSCOPIC (NOT AT [facility]) - Abnormal; Notable for the following:    Ketones, ur 15 (*)    Protein, ur 30 (*)    All other components within normal limits  URINE MICROSCOPIC-ADD ON - Abnormal; Notable for the following:    Squamous Epithelial / LPF 0-5 (*)    Bacteria, UA FEW (*)    Casts GRANULAR CAST (*)    All other components within normal limits  CREATININE, SERUM - Abnormal; Notable for the following:    Creatinine, Ser 1.30 (*)    All other components within normal limits  GLUCOSE, CAPILLARY - Abnormal; Notable for the following:    Glucose-Capillary 132 (*)    All other components within normal limits  CULTURE, BLOOD (ROUTINE X 2)  CULTURE, BLOOD (ROUTINE X 2)  URINE CULTURE  HEMOGLOBIN A1C    VITAMIN B12  FOLATE  IRON AND TIBC  FERRITIN  RETICULOCYTES  BASIC METABOLIC PANEL  CBC  I-STAT CG4 LACTIC ACID, ED  I-STAT CG4 LACTIC ACID, ED    Imaging Review Dg Chest 2 View  02/02/2016  CLINICAL DATA:  Cough EXAM: CHEST  2 VIEW COMPARISON:  01/31/2016 chest radiograph. FINDINGS: Stable cardiomediastinal silhouette with normal heart size. No pneumothorax. No pleural effusion. Lungs appear clear, with no acute consolidative airspace disease and no pulmonary edema. IMPRESSION: No active cardiopulmonary disease. Electronically Signed   By: Ilona Sorrel M.D.   On: 02/02/2016 11:27   Dg Foot Complete Left  02/02/2016  CLINICAL DATA:  Ulcers along foot.  Charcot foot. EXAM: LEFT FOOT - COMPLETE 3+ VIEW COMPARISON:  Plain films LEFT foot, 08/17/2015 FINDINGS: Compared to exam of 08/17/2015, there is interval osseous erosion at the base of the fifth, fourth, third, and second metatarsals. There is bone loss within the tarsal bones with noticeable bone loss in cuneiforms. There is a robust periosteal reaction along the shafts of the metatarsals with dense dense sclerosis of the metatarsals proximal. There is interval increase in soft tissue swelling throughout the midfoot. Small amount subcutaneous gas along the plantar surface. IMPRESSION: Interval severe osseous erosion involving the proximal metatarsals and tarsal bones along the metatarsal tarsal joints. Interval marked soft tissue swelling. Differential includes aggressive erosive arthropathy versus OSTEOMYELITIS. Favor severe osteomyelitis. Electronically Signed   By: Suzy Bouchard M.D.   On: 02/02/2016 13:34   I have personally reviewed and evaluated these images and lab results as part of my medical decision-making.   EKG Interpretation   Date/Time:  Saturday February 02 2016 12:00:03 EDT Ventricular Rate:  90 PR Interval:    QRS Duration: 98 QT Interval:  366 QTC Calculation: 448 R Axis:   6 Text Interpretation:  Sinus rhythm Low  voltage, precordial leads Probable  anteroseptal infarct, old Confirmed by DELO  MD, DOUGLAS ([id number]) on  02/02/2016 12:16:56 PM      MDM   Afebrile and ED. CBC shows WBC 16.8, hemoglobin 10.7. CMP shows sodium 134, CO2 21, glucose 153, creatinine 1.57, albumin 2.8. Lactate 1.61. UA shows 15 ketones, 30 protein, few bacteria, granular casts.  Blood cultures sent. X-ray of left foot shows interval severe osseous erosion involving the proximal metatarsals and tarsal bones along the metatarsal tarsal joints; interval marked soft tissue swelling; differential includes aggressive erosive arthropathy versus osteomyelitis, favor severe osteomyelitis. Vancomycin and Zosyn initiated in ED. I consulted medicine. Dr. Eliseo Squires will admit the patient for further evaluation and treatment. I also consulted orthopedics and spoke with Dr. [name] who will see the patient in the hospital tomorrow. Patient also evaluated by Dr. Stark Jock who is in agreement with plan.  Final diagnoses:  Acute osteomyelitis of left foot [facility])       Frederica Kuster, PA-C 02/02/16 1718  Veryl Speak, MD 02/03/16 [phone]

## 2016-02-02 NOTE — ED Notes (Signed)
Patient transported to X-ray 

## 2016-02-02 NOTE — Progress Notes (Signed)
Pharmacy Antibiotic Note  Troy [name] is a 50 y.o. male admitted on 02/02/2016 with osteomyelitis 2/2 diabetic foot ulcer.  Pharmacy has been consulted for vancomycin and zosyn dosing. Had first dose of Zosyn 3.375 over 30 mins at 1345 today.  Plan: Load: Vancomycin 2500 mg IV x1 over 2.5 hours then Vancomycin 1500 IV every 24 hours.  Goal trough 15-20 mcg/mL.  Zosyn 3.375 IV q8h (4hr infusion) starting today at 2000   Height: 5\' 10"  (177.8 cm) Weight: 285 lb (129.275 kg) IBW/kg (Calculated) : 73  Temp (24hrs), Avg:98.8 F (37.1 C), Min:98.8 F (37.1 C), Max:98.8 F (37.1 C)   Recent Labs Lab 01/31/16 1408 02/02/16 1218 02/02/16 1222  WBC  --  16.8*  --   CREATININE 1.20 1.57*  --   LATICACIDVEN  --   --  1.61    Estimated Creatinine Clearance: 74.3 mL/min (by C-G formula based on Cr of 1.57).   Normalized CrCl = 56 ml/min  No Known Allergies  Antimicrobials this admission: Vanc 7/8>> Zosyn 7/8>>  Dose adjustments this admission: none  Microbiology results: 7/8 BCx: sent 7/8 UCx: sent    Thank you for allowing pharmacy to be a part of this patient's care.  Carlean Jews 02/02/2016 1:51 PM

## 2016-02-03 ENCOUNTER — Encounter (HOSPITAL_COMMUNITY): Payer: Self-pay | Admitting: Family Medicine

## 2016-02-03 LAB — BASIC METABOLIC PANEL
Anion gap: 10 (ref 5–15)
BUN: 24 mg/dL — AB (ref 6–20)
CHLORIDE: 106 mmol/L (ref 101–111)
CO2: 21 mmol/L — AB (ref 22–32)
CREATININE: 1.69 mg/dL — AB (ref 0.61–1.24)
Calcium: 8.9 mg/dL (ref 8.9–10.3)
GFR calc non Af Amer: 45 mL/min — ABNORMAL LOW (ref >60)
GFR, EST AFRICAN AMERICAN: 52 mL/min — AB (ref >60)
Glucose, Bld: 116 mg/dL — ABNORMAL HIGH (ref 65–99)
POTASSIUM: 4.3 mmol/L (ref 3.5–5.1)
Sodium: 137 mmol/L (ref 135–145)

## 2016-02-03 LAB — GLUCOSE, CAPILLARY
GLUCOSE-CAPILLARY: 167 mg/dL — AB (ref 65–99)
GLUCOSE-CAPILLARY: 144 mg/dL — AB (ref 65–99)
Glucose-Capillary: 120 mg/dL — ABNORMAL HIGH (ref 65–99)
Glucose-Capillary: 190 mg/dL — ABNORMAL HIGH (ref 65–99)

## 2016-02-03 LAB — CBC
HEMATOCRIT: 32.1 % — AB (ref 39.0–52.0)
Hemoglobin: 10.5 g/dL — ABNORMAL LOW (ref 13.0–17.0)
MCH: 27.9 pg (ref 26.0–34.0)
MCHC: 32.7 g/dL (ref 30.0–36.0)
MCV: 85.1 fL (ref 78.0–100.0)
PLATELETS: 274 10*3/uL (ref 150–400)
RBC: 3.77 MIL/uL — AB (ref 4.22–5.81)
RDW: 15 % (ref 11.5–15.5)
WBC: 13 10*3/uL — ABNORMAL HIGH (ref 4.0–10.5)

## 2016-02-03 LAB — URINE CULTURE: CULTURE: NO GROWTH

## 2016-02-03 MED ORDER — SALINE SPRAY 0.65 % NA SOLN
1.0000 | NASAL | Status: DC | PRN
  Administered 2016-02-03: 1 via NASAL
  Filled 2016-02-03: qty 44

## 2016-02-03 MED ORDER — HEPARIN SODIUM (PORCINE) 5000 UNIT/ML IJ SOLN
5000.0000 [IU] | Freq: Two times a day (BID) | INTRAMUSCULAR | Status: DC
  Administered 2016-02-03 – 2016-02-05 (×5): 5000 [IU] via SUBCUTANEOUS
  Filled 2016-02-03 (×6): qty 1

## 2016-02-03 NOTE — Progress Notes (Signed)
Increased drainage noted from left foot wounds.  Appears to have abscessed area medially to great toe.  DSD applied to capture copious amt. Of exudate evident with movement.

## 2016-02-03 NOTE — Progress Notes (Signed)
PROGRESS NOTE    Troy [name]  [id number]  DOB: Dec 31, 1965  DOA: 02/02/2016 PCP: Kandice Hams, MD Outpatient Specialists:  Hospital course: Troy [name] is a 50 y.o. male with a Past Medical History of DM and HTN. 7 months ago was diagnosed with Charcot's Joint by podiatry. He over the last weeks has developed worsening swelling in left foot (wears a standard work boot during working hours). He has no feeling in his left foot. On exam has several areas of bruising/blistering. On x ray was found to have osteomyelitis- vanc/zosyn started, and ortho consulted [name]).   Assessment & Plan:   1. Severe osteomyelitis left foot.  Continue Zosyn/Vancomycin per pharmacy, Follow cultures. Ortho yates to see later today.  Supportive therapy.  2. Left Charcot foot - management of acute infection as above.  See ortho recommendations.  3. Severe diabetic neuropathy - Pt is insensate in both feet.  Foot care is essential.   4. 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 Currently stable.  5. Essential Hypertension - Following.  Currently on no drug therapy.   6. Chronic Kidney Disease stage 2 - Follow closely. Avoid nephrotoxins. Gentle IV hydration.  7. Anemia of CKD - follow hemoglobin.      DVT prophylaxis: lovenox Code Status: full  Family Communication: wife  Disposition Plan: home  Consults called: inpatient  Admission status: inpatient Consultants:  Yates, orthopedics  Antimicrobials: Anti-infectives    Start     Dose/Rate Route Frequency Ordered Stop   02/03/16 1300  vancomycin (VANCOCIN) 1,500 mg in sodium chloride 0.9 % 500 mL IVPB     1,500 mg 250 mL/hr over 120 Minutes Intravenous Every 24 hours 02/02/16 1310     02/02/16 2000  piperacillin-tazobactam (ZOSYN) IVPB 3.375 g     3.375 g 12.5 mL/hr over 240 Minutes Intravenous Every 8 hours 02/02/16 1459     02/02/16  1930  piperacillin-tazobactam (ZOSYN) IVPB 3.375 g  Status:  Discontinued     3.375 g 12.5 mL/hr over 240 Minutes Intravenous Every 8 hours 02/02/16 1453 02/02/16 1459   02/02/16 1330  vancomycin (VANCOCIN) 2,500 mg in sodium chloride 0.9 % 500 mL IVPB     2,500 mg 250 mL/hr over 120 Minutes Intravenous  Once 02/02/16 1310 02/02/16 1810   02/02/16 1300  piperacillin-tazobactam (ZOSYN) IVPB 3.375 g     3.375 g 100 mL/hr over 30 Minutes Intravenous  Once 02/02/16 1256 02/02/16 1412     Subjective: No acute changes overnight  Objective: Filed Vitals:   02/02/16 1452 02/02/16 1537 02/02/16 1930 02/03/16 0428  BP:   127/74 126/73  Pulse: 91  85 84  Temp:  99.3 F (37.4 C) 99.3 F (37.4 C) 99.1 F (37.3 C)  TempSrc:  Oral Oral Oral  Resp: 20  18 18   Height:      Weight:      SpO2: 96%  99% 98%    Intake/Output Summary (Last 24 hours) at 02/03/16 0709 Last data filed at 02/02/16 1931  Gross per 24 hour  Intake      0 ml  Output    200 ml  Net   -200 ml   Filed Weights   02/02/16 1057  Weight: 285 lb (129.275 kg)    Exam:  General: Appears calm and comfortable, obese no acute distress  Eyes: PERRL, EOMI, normal lids, iris  ENT: grossly normal hearing, lips & tongue, weakness membranes of his  mouth are pink slightly dry  Neck: no LAD, masses or thyromegaly  Cardiovascular: RRR, no m/r/g. Trace LE edema.   Respiratory: CTA bilaterally, no w/r/r. Normal respiratory effort.  Abdomen: soft, ntnd, positive bowel sounds throughout no guarding or rebounding  Skin: no rash or induration seen on limited exam  Musculoskeletal: grossly normal tone BUE/BLE, good ROM, left charcot foot, Left foot with 6 cm diameter raised fluctuant area base of great toe no erythema no heat no drainage appears to have a healing scab in the center, 2 cm ulcer with purulent drainage middle of underside foot, lateral aspect left foot with what appears to be an open blister draining thin  pink fluid  Psychiatric: grossly normal mood and affect, speech fluent and appropriate, AOx3  Neurologic: CN 2-12 grossly intact, moves all extremities in coordinated fashion, sensation intact   Data Reviewed: Basic Metabolic Panel:  Recent Labs Lab 01/31/16 1408 02/02/16 1218 02/02/16 1509 02/03/16 0536  NA 139 134*  --  137  K 5.3* 4.3  --  4.3  CL 105 105  --  106  CO2  --  21*  --  21*  GLUCOSE 113* 153*  --  116*  BUN 29* 21*  --  24*  CREATININE 1.20 1.57* 1.30* 1.69*  CALCIUM  --  9.0  --  8.9   Liver Function Tests:  Recent Labs Lab 02/02/16 1218  AST 28  ALT 32  ALKPHOS 88  BILITOT 0.6  PROT 7.8  ALBUMIN 2.8*   No results for input(s): LIPASE, AMYLASE in the last 168 hours. No results for input(s): AMMONIA in the last 168 hours. CBC:  Recent Labs Lab 01/31/16 1408 02/02/16 1218 02/03/16 0536  WBC  --  16.8* 13.0*  NEUTROABS  --  11.6*  --   HGB 12.9* 10.7* 10.5*  HCT 38.0* 32.1* 32.1*  MCV  --  84.3 85.1  PLT  --  256 274   Cardiac Enzymes: No results for input(s): CKTOTAL, CKMB, CKMBINDEX, TROPONINI in the last 168 hours. BNP (last 3 results) No results for input(s): PROBNP in the last 8760 hours. CBG:  Recent Labs Lab 02/02/16 1649 02/02/16 2120 02/03/16 0559  GLUCAP 132* 129* 120*    No results found for this or any previous visit (from the past 240 hour(s)).   Studies: Dg Chest 2 View  02/02/2016  CLINICAL DATA:  Cough EXAM: CHEST  2 VIEW COMPARISON:  01/31/2016 chest radiograph. FINDINGS: Stable cardiomediastinal silhouette with normal heart size. No pneumothorax. No pleural effusion. Lungs appear clear, with no acute consolidative airspace disease and no pulmonary edema. IMPRESSION: No active cardiopulmonary disease. Electronically Signed   By: Ilona Sorrel M.D.   On: 02/02/2016 11:27   Dg Foot Complete Left  02/02/2016  CLINICAL DATA:  Ulcers along foot.  Charcot foot. EXAM: LEFT FOOT - COMPLETE 3+ VIEW COMPARISON:  Plain films  LEFT foot, 08/17/2015 FINDINGS: Compared to exam of 08/17/2015, there is interval osseous erosion at the base of the fifth, fourth, third, and second metatarsals. There is bone loss within the tarsal bones with noticeable bone loss in cuneiforms. There is a robust periosteal reaction along the shafts of the metatarsals with dense dense sclerosis of the metatarsals proximal. There is interval increase in soft tissue swelling throughout the midfoot. Small amount subcutaneous gas along the plantar surface. IMPRESSION: Interval severe osseous erosion involving the proximal metatarsals and tarsal bones along the metatarsal tarsal joints. Interval marked soft tissue swelling. Differential includes aggressive erosive arthropathy  versus OSTEOMYELITIS. Favor severe osteomyelitis. Electronically Signed   By: Suzy Bouchard M.D.   On: 02/02/2016 13:34     Scheduled Meds: . aspirin EC  81 mg Oral Daily  . fluticasone furoate-vilanterol  1 puff Inhalation Daily  . heparin  5,000 Units Subcutaneous Q8H  . insulin aspart  0-15 Units Subcutaneous TID WC  . insulin aspart  0-5 Units Subcutaneous QHS  . insulin glargine  10 Units Subcutaneous QHS  . meloxicam  7.5 mg Oral Daily  . piperacillin-tazobactam (ZOSYN)  IV  3.375 g Intravenous Q8H  . vancomycin  1,500 mg Intravenous Q24H   Continuous Infusions:   Principal Problem:   Osteomyelitis of foot, acute, left Active Problems:   Hypertension   Diabetes (HCC)   Diabetic foot (HCC)   Osteomyelitis (HCC)   CKD (chronic kidney disease), stage II   Anemia   Time spent:    Irwin Brakeman, MD, FAAFP [facility] Pager [phone]  If 7PM-7AM, please contact night-coverage [email or web address] Password [email or web address] 02/03/2016, 7:09 AM    LOS: 1 day

## 2016-02-04 ENCOUNTER — Inpatient Hospital Stay (HOSPITAL_COMMUNITY): Payer: BLUE CROSS/BLUE SHIELD

## 2016-02-04 DIAGNOSIS — D649 Anemia, unspecified: Secondary | ICD-10-CM

## 2016-02-04 LAB — GLUCOSE, CAPILLARY
GLUCOSE-CAPILLARY: 128 mg/dL — AB (ref 65–99)
GLUCOSE-CAPILLARY: 120 mg/dL — AB (ref 65–99)
GLUCOSE-CAPILLARY: 117 mg/dL — AB (ref 65–99)
Glucose-Capillary: 189 mg/dL — ABNORMAL HIGH (ref 65–99)

## 2016-02-04 LAB — COMPREHENSIVE METABOLIC PANEL
ALBUMIN: 2.4 g/dL — AB (ref 3.5–5.0)
ALT: 47 U/L (ref 17–63)
AST: 36 U/L (ref 15–41)
Alkaline Phosphatase: 69 U/L (ref 38–126)
Anion gap: 8 (ref 5–15)
BILIRUBIN TOTAL: 0.7 mg/dL (ref 0.3–1.2)
BUN: 25 mg/dL — AB (ref 6–20)
CHLORIDE: 107 mmol/L (ref 101–111)
CO2: 22 mmol/L (ref 22–32)
CREATININE: 1.64 mg/dL — AB (ref 0.61–1.24)
Calcium: 8.6 mg/dL — ABNORMAL LOW (ref 8.9–10.3)
GFR calc Af Amer: 54 mL/min — ABNORMAL LOW (ref >60)
GFR, EST NON AFRICAN AMERICAN: 47 mL/min — AB (ref >60)
GLUCOSE: 105 mg/dL — AB (ref 65–99)
POTASSIUM: 4.2 mmol/L (ref 3.5–5.1)
Sodium: 137 mmol/L (ref 135–145)
Total Protein: 7.5 g/dL (ref 6.5–8.1)

## 2016-02-04 LAB — CBC WITH DIFFERENTIAL/PLATELET
BASOS PCT: 1 %
Basophils Absolute: 0.1 10*3/uL (ref 0.0–0.1)
EOS ABS: 0.7 10*3/uL (ref 0.0–0.7)
EOS PCT: 6 %
HEMATOCRIT: 29.5 % — AB (ref 39.0–52.0)
Hemoglobin: 9.9 g/dL — ABNORMAL LOW (ref 13.0–17.0)
LYMPHS PCT: 23 %
Lymphs Abs: 2.7 10*3/uL (ref 0.7–4.0)
MCH: 27.8 pg (ref 26.0–34.0)
MCHC: 33.6 g/dL (ref 30.0–36.0)
MCV: 82.9 fL (ref 78.0–100.0)
Monocytes Absolute: 1.1 10*3/uL — ABNORMAL HIGH (ref 0.1–1.0)
Monocytes Relative: 9 %
NEUTROS ABS: 7.2 10*3/uL (ref 1.7–7.7)
Neutrophils Relative %: 61 %
PLATELETS: 272 10*3/uL (ref 150–400)
RBC: 3.56 MIL/uL — AB (ref 4.22–5.81)
RDW: 15.1 % (ref 11.5–15.5)
WBC: 11.8 10*3/uL — ABNORMAL HIGH (ref 4.0–10.5)

## 2016-02-04 LAB — HEMOGLOBIN A1C
HEMOGLOBIN A1C: 6.5 % — AB (ref 4.8–5.6)
Mean Plasma Glucose: 140 mg/dL

## 2016-02-04 MED ORDER — DM-GUAIFENESIN ER 30-600 MG PO TB12
2.0000 | ORAL_TABLET | Freq: Two times a day (BID) | ORAL | Status: DC | PRN
  Administered 2016-02-04: 2 via ORAL
  Filled 2016-02-04 (×2): qty 2

## 2016-02-04 NOTE — Consult Note (Signed)
Patient ID: Troy [name] MRN: [id number] DOB/AGE: [birth year] 50 y.o.  Admit date: 02/02/2016  Admission Diagnoses:  Principal Problem:   Osteomyelitis of foot, acute, left Active Problems:   Hypertension   Diabetes ([place])   Diabetic foot ([place])   Osteomyelitis ([facility])   CKD (chronic kidney disease), stage II   Anemia   HPI: Patient being seen at the request of hospitalist for left foot infection.  Patient states that he has had chronic foot problems.  Seen by PCP in January 2017 and had xray left foot done.  Report read severe destructive changes noted of the foot and ankle consistent with charcot's joints.  Chronic infection could present in this fashion as well.  Subluxation of the second MTP joint is present. Patient was referred to Dr Paulla Dolly podiatrist shortly after and states that different shoes were recommended.  Did not have MRI or return appointment.  Seen at urgent care 31 January 2016 with fever.  Returned to ED after he began having left foot drainage and foul odor.    Past Medical History: Past Medical History  Diagnosis Date  . Hypertension   . Diabetes mellitus   . Gout   . Bronchitis   . Kidney stones 11/30/2013  . Asthma   . Neuromuscular disorder ([place])   . Obesity   . CKD (chronic kidney disease)   . Diabetic Charcot foot ([place])     Left  . Polyneuropathy in diabetes [facility])     Surgical History: Past Surgical History  Procedure Laterality Date  . No past surgeries    . Cholecystectomy N/A 12/03/2013    Procedure: LAPAROSCOPIC CHOLECYSTECTOMY WITH INTRAOPERATIVE CHOLANGIOGRAM;  Surgeon: Rolm Bookbinder, MD;  Location: [facility] OR;  Service: General;  Laterality: N/A;    Family History: Family History  Problem Relation Age of Onset  . Diabetes Mellitus II Father   . Diabetes Father   . Hypertension Father   . CAD Neg Hx   . Diabetes Mother   . Hypertension Mother     Social History: Social History   Social History  . Marital Status: Married    Spouse  Name: N/A  . Number of Children: N/A  . Years of Education: N/A   Occupational History  . Not on file.   Social History Main Topics  . Smoking status: Never Smoker   . Smokeless tobacco: Not on file  . Alcohol Use: Yes     Comment: occasionally  . Drug Use: No  . Sexual Activity: Not on file   Other Topics Concern  . Not on file   Social History Narrative    Allergies: Review of patient's allergies indicates no known allergies.  Medications: I have reviewed the patient's current medications.  Vital Signs: Patient Vitals for the past 24 hrs:  BP Temp Temp src Pulse Resp SpO2  02/04/16 0520 (!) 142/87 mmHg 99.2 F (37.3 C) Oral 81 18 98 %  02/03/16 2000 129/79 mmHg 99 F (37.2 C) Oral 80 18 99 %  02/03/16 1544 132/75 mmHg 99.2 F (37.3 C) Oral 82 18 99 %    Radiology: Dg Chest 2 View  02/02/2016  CLINICAL DATA:  Cough EXAM: CHEST  2 VIEW COMPARISON:  01/31/2016 chest radiograph. FINDINGS: Stable cardiomediastinal silhouette with normal heart size. No pneumothorax. No pleural effusion. Lungs appear clear, with no acute consolidative airspace disease and no pulmonary edema. IMPRESSION: No active cardiopulmonary disease. Electronically Signed   By: Ilona Sorrel M.D.   On: 02/02/2016 11:27  Dg Chest 2 View  01/31/2016  CLINICAL DATA:  Shortness of breath with wheezing and fever EXAM: CHEST  2 VIEW COMPARISON:  Chest radiograph and chest CT August 25, 2015 FINDINGS: There is no apparent edema or consolidation. Heart size and pulmonary vascularity are normal. No adenopathy. There is degenerative change in the thoracic spine. IMPRESSION: No edema or consolidation. Electronically Signed   By: Lowella Grip III M.D.   On: 01/31/2016 14:20   Dg Foot Complete Left  02/02/2016  CLINICAL DATA:  Ulcers along foot.  Charcot foot. EXAM: LEFT FOOT - COMPLETE 3+ VIEW COMPARISON:  Plain films LEFT foot, 08/17/2015 FINDINGS: Compared to exam of 08/17/2015, there is interval osseous erosion  at the base of the fifth, fourth, third, and second metatarsals. There is bone loss within the tarsal bones with noticeable bone loss in cuneiforms. There is a robust periosteal reaction along the shafts of the metatarsals with dense dense sclerosis of the metatarsals proximal. There is interval increase in soft tissue swelling throughout the midfoot. Small amount subcutaneous gas along the plantar surface. IMPRESSION: Interval severe osseous erosion involving the proximal metatarsals and tarsal bones along the metatarsal tarsal joints. Interval marked soft tissue swelling. Differential includes aggressive erosive arthropathy versus OSTEOMYELITIS. Favor severe osteomyelitis. Electronically Signed   By: Suzy Bouchard M.D.   On: 02/02/2016 13:34    Labs:  Recent Labs  02/03/16 0536 02/04/16 0211  WBC 13.0* 11.8*  RBC 3.77* 3.56*  HCT 32.1* 29.5*  PLT 274 272    Recent Labs  02/03/16 0536 02/04/16 0211  NA 137 137  K 4.3 4.2  CL 106 107  CO2 21* 22  BUN 24* 25*  CREATININE 1.69* 1.64*  GLUCOSE 116* 105*  CALCIUM 8.9 8.6*   No results for input(s): LABPT, INR in the last 72 hours.  Review of Systems: Review of Systems  Constitutional: Negative.   HENT: Negative.   Eyes: Negative.   Respiratory: Negative.   Cardiovascular: Negative.   Gastrointestinal: Negative.   Genitourinary: Negative.   Neurological: Positive for sensory change.  Psychiatric/Behavioral: Negative.     Physical Exam: [name] alert and oriented and in NAD.  Head normocephalic, atraumatic.   No respiratory distress Abdomen round nondistended.  Left foot markedly swollen.  Draining ulcers medial, plantar and lateral foot.  Calf nontender.    Assessment and Plan: Left foot infection.   Will get MRI left foot to better evaluate infection and osteomyelitis.  xrays from January and most recent reviewed with patient and family members.  Continue current treatment plan.  Elevate foot above heart level.  History and treatment plan reviewed with my attending Dr Rodell Perna.    Alyson Locket. Alvino Blood For Rodell Perna MD [facility] [phone] Bilateral charcot midfoot breakdown with osteolysis and left  foot midfoot osteomyelitis. Will need left Below knee amputation. MRI pending to confirm Osteomyelitis.  Discuss BKA prothesis once swelling down in a few months, stump shrinker sock. Has multiple steps front and back door. MRI ordered.

## 2016-02-04 NOTE — Progress Notes (Signed)
PROGRESS NOTE    Troy [name]  [id number]  DOB: 1965-10-14  DOA: 02/02/2016 PCP: Kandice Hams, MD Outpatient Specialists:  Hospital course: Troy [name] is a 50 y.o. male with a Past Medical History of DM and HTN. 7 months ago was diagnosed with Charcot's Joint by podiatry. He over the last weeks has developed worsening swelling in left foot (wears a standard work boot during working hours). He has no feeling in his left foot. On exam has several areas of bruising/blistering. On x ray was found to have osteomyelitis- vanc/zosyn started, and ortho consulted [name]).   Assessment & Plan:   1. Severe osteomyelitis left foot.  Continue Zosyn/Vancomycin per pharmacy, Follow cultures.  I called ortho and spoke with them and they will see patient today.  2. Left Charcot foot - management of acute infection as above.  See ortho recommendations.  3. Severe diabetic neuropathy - Pt is insensate in both feet.  Foot care is essential.   4. 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  A1c 6.5. Currently stable. .cbg  5. Essential Hypertension - Following.  Currently on no drug therapy.   6. Chronic Kidney Disease stage 2 - Follow closely. Avoid nephrotoxins. Gentle IV hydration.  7. Anemia of CKD - follow hemoglobin.      CBG (last 3)   Recent Labs  02/03/16 1629 02/03/16 2121 02/04/16 0610  GLUCAP 167* 144* 128*   DVT prophylaxis: lovenox Code Status: full  Family Communication: wife  Disposition Plan: home  Consults called: inpatient  Admission status: inpatient Consultants:  [facility]  Antimicrobials: Anti-infectives    Start     Dose/Rate Route Frequency Ordered Stop   02/03/16 1300  vancomycin (VANCOCIN) 1,500 mg in sodium chloride 0.9 % 500 mL IVPB     1,500 mg 250 mL/hr over 120 Minutes Intravenous Every 24 hours 02/02/16 1310     02/02/16 2000   piperacillin-tazobactam (ZOSYN) IVPB 3.375 g     3.375 g 12.5 mL/hr over 240 Minutes Intravenous Every 8 hours 02/02/16 1459     02/02/16 1930  piperacillin-tazobactam (ZOSYN) IVPB 3.375 g  Status:  Discontinued     3.375 g 12.5 mL/hr over 240 Minutes Intravenous Every 8 hours 02/02/16 1453 02/02/16 1459   02/02/16 1330  vancomycin (VANCOCIN) 2,500 mg in sodium chloride 0.9 % 500 mL IVPB     2,500 mg 250 mL/hr over 120 Minutes Intravenous  Once 02/02/16 1310 02/02/16 1810   02/02/16 1300  piperacillin-tazobactam (ZOSYN) IVPB 3.375 g     3.375 g 100 mL/hr over 30 Minutes Intravenous  Once 02/02/16 1256 02/02/16 1412     Subjective: Pt reports pain right hand and continued draining of foot.  He has no feeling in left foot.  Objective: Filed Vitals:   02/03/16 0428 02/03/16 1544 02/03/16 2000 02/04/16 0520  BP: 126/73 132/75 129/79 142/87  Pulse: 84 82 80 81  Temp: 99.1 F (37.3 C) 99.2 F (37.3 C) 99 F (37.2 C) 99.2 F (37.3 C)  TempSrc: Oral Oral Oral Oral  Resp: 18 18 18 18   Height:      Weight:      SpO2: 98% 99% 99% 98%    Intake/Output Summary (Last 24 hours) at 02/04/16 1014 Last data filed at 02/04/16 0900  Gross per 24 hour  Intake   1270 ml  Output    300 ml  Net    970 ml   [employer]  02/02/16 1057  Weight: 285 lb (129.275 kg)    Exam:  General: Appears calm and comfortable, obese no acute distress  Eyes: PERRL, EOMI, normal lids, iris  ENT: grossly normal hearing, lips & tongue, weakness membranes of his mouth are pink slightly dry  Neck: no LAD, masses or thyromegaly  Cardiovascular: RRR, no m/r/g. Trace LE edema.   Respiratory: CTA bilaterally, no w/r/r. Normal respiratory effort.  Abdomen: soft, ntnd, positive bowel sounds throughout no guarding or rebounding  Skin: no rash or induration seen on limited exam  Musculoskeletal: grossly normal tone BUE/BLE, good ROM, left charcot foot, Left foot with 6 cm diameter raised  fluctuant area base of great toe no erythema no heat no drainage appears to have a healing scab in the center, 2 cm ulcer with purulent drainage middle of underside foot, lateral aspect left foot with what appears to be an open blister draining thin pink fluid  Psychiatric: grossly normal mood and affect, speech fluent and appropriate, AOx3. Arthritic changes and swelling of right hand  Neurologic: CN 2-12 grossly intact, moves all extremities in coordinated fashion, sensation intact   Data Reviewed: Basic Metabolic Panel:  Recent Labs Lab 01/31/16 1408 02/02/16 1218 02/02/16 1509 02/03/16 0536 02/04/16 0211  NA 139 134*  --  137 137  K 5.3* 4.3  --  4.3 4.2  CL 105 105  --  106 107  CO2  --  21*  --  21* 22  GLUCOSE 113* 153*  --  116* 105*  BUN 29* 21*  --  24* 25*  CREATININE 1.20 1.57* 1.30* 1.69* 1.64*  CALCIUM  --  9.0  --  8.9 8.6*   Liver Function Tests:  Recent Labs Lab 02/02/16 1218 02/04/16 0211  AST 28 36  ALT 32 47  ALKPHOS 88 69  BILITOT 0.6 0.7  PROT 7.8 7.5  ALBUMIN 2.8* 2.4*   No results for input(s): LIPASE, AMYLASE in the last 168 hours. No results for input(s): AMMONIA in the last 168 hours. CBC:  Recent Labs Lab 01/31/16 1408 02/02/16 1218 02/03/16 0536 02/04/16 0211  WBC  --  16.8* 13.0* 11.8*  NEUTROABS  --  11.6*  --  7.2  HGB 12.9* 10.7* 10.5* 9.9*  HCT 38.0* 32.1* 32.1* 29.5*  MCV  --  84.3 85.1 82.9  PLT  --  256 274 272   Cardiac Enzymes: No results for input(s): CKTOTAL, CKMB, CKMBINDEX, TROPONINI in the last 168 hours. BNP (last 3 results) No results for input(s): PROBNP in the last 8760 hours. CBG:  Recent Labs Lab 02/03/16 0559 02/03/16 1134 02/03/16 1629 02/03/16 2121 02/04/16 0610  GLUCAP 120* 190* 167* 144* 128*    Recent Results (from the past 240 hour(s))  Culture, blood (Routine x 2)     Status: None (Preliminary result)   Collection Time: 02/02/16 11:45 AM  Result Value Ref Range Status   Specimen  Description BLOOD RIGHT FOREARM  Final   Special Requests BOTTLES DRAWN AEROBIC AND ANAEROBIC  5CC  Final   Culture NO GROWTH 1 DAY  Final   Report Status PENDING  Incomplete  Culture, blood (Routine x 2)     Status: None (Preliminary result)   Collection Time: 02/02/16 11:55 AM  Result Value Ref Range Status   Specimen Description BLOOD RIGHT HAND  Final   Special Requests BOTTLES DRAWN AEROBIC AND ANAEROBIC  5CC  Final   Culture NO GROWTH 1 DAY  Final   Report Status PENDING  Incomplete  Urine culture     Status: None   Collection Time: 02/02/16  1:29 PM  Result Value Ref Range Status   Specimen Description URINE, CLEAN CATCH  Final   Special Requests NONE  Final   Culture NO GROWTH  Final   Report Status 02/03/2016 FINAL  Final     Studies: Dg Chest 2 View  02/02/2016  CLINICAL DATA:  Cough EXAM: CHEST  2 VIEW COMPARISON:  01/31/2016 chest radiograph. FINDINGS: Stable cardiomediastinal silhouette with normal heart size. No pneumothorax. No pleural effusion. Lungs appear clear, with no acute consolidative airspace disease and no pulmonary edema. IMPRESSION: No active cardiopulmonary disease. Electronically Signed   By: Ilona Sorrel M.D.   On: 02/02/2016 11:27   Dg Foot Complete Left  02/02/2016  CLINICAL DATA:  Ulcers along foot.  Charcot foot. EXAM: LEFT FOOT - COMPLETE 3+ VIEW COMPARISON:  Plain films LEFT foot, 08/17/2015 FINDINGS: Compared to exam of 08/17/2015, there is interval osseous erosion at the base of the fifth, fourth, third, and second metatarsals. There is bone loss within the tarsal bones with noticeable bone loss in cuneiforms. There is a robust periosteal reaction along the shafts of the metatarsals with dense dense sclerosis of the metatarsals proximal. There is interval increase in soft tissue swelling throughout the midfoot. Small amount subcutaneous gas along the plantar surface. IMPRESSION: Interval severe osseous erosion involving the proximal metatarsals and tarsal  bones along the metatarsal tarsal joints. Interval marked soft tissue swelling. Differential includes aggressive erosive arthropathy versus OSTEOMYELITIS. Favor severe osteomyelitis. Electronically Signed   By: Suzy Bouchard M.D.   On: 02/02/2016 13:34   Scheduled Meds: . aspirin EC  81 mg Oral Daily  . fluticasone furoate-vilanterol  1 puff Inhalation Daily  . heparin  5,000 Units Subcutaneous Q12H  . insulin aspart  0-15 Units Subcutaneous TID WC  . insulin aspart  0-5 Units Subcutaneous QHS  . insulin glargine  10 Units Subcutaneous QHS  . piperacillin-tazobactam (ZOSYN)  IV  3.375 g Intravenous Q8H  . vancomycin  1,500 mg Intravenous Q24H   Continuous Infusions:   Principal Problem:   Osteomyelitis of foot, acute, left Active Problems:   Hypertension   Diabetes ([facility])   Diabetic foot ([facility])   Osteomyelitis ([facility])   CKD (chronic kidney disease), stage II   Anemia  Time spent:   Irwin Brakeman, MD, FAAFP [facility] Pager [phone]  If 7PM-7AM, please contact night-coverage [email or web address] Password [email or web address] 02/04/2016, 10:14 AM    LOS: 2 days

## 2016-02-05 ENCOUNTER — Other Ambulatory Visit: Payer: Self-pay | Admitting: Orthopaedic Surgery

## 2016-02-05 ENCOUNTER — Inpatient Hospital Stay (HOSPITAL_COMMUNITY): Payer: BLUE CROSS/BLUE SHIELD

## 2016-02-05 DIAGNOSIS — M10031 Idiopathic gout, right wrist: Secondary | ICD-10-CM

## 2016-02-05 LAB — SURGICAL PCR SCREEN
MRSA, PCR: NEGATIVE
STAPHYLOCOCCUS AUREUS: POSITIVE — AB

## 2016-02-05 LAB — GLUCOSE, CAPILLARY
GLUCOSE-CAPILLARY: 122 mg/dL — AB (ref 65–99)
GLUCOSE-CAPILLARY: 151 mg/dL — AB (ref 65–99)
GLUCOSE-CAPILLARY: 108 mg/dL — AB (ref 65–99)
Glucose-Capillary: 122 mg/dL — ABNORMAL HIGH (ref 65–99)

## 2016-02-05 MED ORDER — COLCHICINE 0.6 MG PO TABS
0.6000 mg | ORAL_TABLET | Freq: Once | ORAL | Status: AC
  Administered 2016-02-05: 0.6 mg via ORAL
  Filled 2016-02-05: qty 1

## 2016-02-05 MED ORDER — CHLORHEXIDINE GLUCONATE CLOTH 2 % EX PADS
6.0000 | MEDICATED_PAD | Freq: Every day | CUTANEOUS | Status: DC
  Administered 2016-02-06 – 2016-02-09 (×2): 6 via TOPICAL

## 2016-02-05 MED ORDER — COLCHICINE 0.6 MG PO TABS
1.2000 mg | ORAL_TABLET | ORAL | Status: AC
  Administered 2016-02-05: 1.2 mg via ORAL
  Filled 2016-02-05: qty 2

## 2016-02-05 MED ORDER — MUPIROCIN 2 % EX OINT
TOPICAL_OINTMENT | Freq: Two times a day (BID) | CUTANEOUS | Status: DC
  Administered 2016-02-05: 1 via NASAL
  Administered 2016-02-06: 13:00:00 via NASAL
  Filled 2016-02-05: qty 22

## 2016-02-05 NOTE — Progress Notes (Signed)
PROGRESS NOTE    Troy [name]  [id number]  DOB: [birth year]  DOA: 02/02/2016 PCP: Kandice Hams, MD Outpatient Specialists:  Hospital course: Troy [name] is a 50 y.o. male with a Past Medical History of DM and HTN. 7 months ago was diagnosed with Charcot's Joint by podiatry. He over the last weeks has developed worsening swelling in left foot (wears a standard work boot during working hours). He has no feeling in his left foot. On exam has several areas of bruising/blistering. On x ray was found to have osteomyelitis- vanc/zosyn started, and ortho consulted Troy [name]).   Assessment & Plan:   1. Severe osteomyelitis left foot.  Continue Zosyn/Vancomycin per pharmacy, Follow cultures.  Ortho to take to OR 7/12 for BKA.    2. Left Charcot foot - management of acute infection as above.  To OR for BKA 7/12.  3. Acute gout attack - right wrist / hand - will order colchicine 1.2 mg followed by 0.6 mg 1 hour later. Xray right hand.   4. Severe diabetic neuropathy - Pt is insensate in both feet.  Foot care is essential for remaining right foot.     5. 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  A1c 6.5. Currently stable. Well controlled now.  6. Essential Hypertension - Following.  Currently on no drug therapy.   7. Chronic Kidney Disease stage 2 - Follow closely. Avoid nephrotoxins. Renally dose medications.   8. Anemia of CKD - follow hemoglobin.      CBG (last 3)   Recent Labs  02/04/16 1619 02/04/16 2152 02/05/16 0645  GLUCAP 120* 117* 122*   DVT prophylaxis: lovenox Code Status: full  Family Communication: wife  Disposition Plan: home  Consults called: inpatient  Admission status: inpatient Consultants:  [facility]  Antimicrobials: Anti-infectives    Start     Dose/Rate Route Frequency Ordered Stop   02/03/16 1300  vancomycin (VANCOCIN) 1,500 mg in sodium chloride 0.9 %  500 mL IVPB     1,500 mg 250 mL/hr over 120 Minutes Intravenous Every 24 hours 02/02/16 1310     02/02/16 2000  piperacillin-tazobactam (ZOSYN) IVPB 3.375 g     3.375 g 12.5 mL/hr over 240 Minutes Intravenous Every 8 hours 02/02/16 1459     02/02/16 1930  piperacillin-tazobactam (ZOSYN) IVPB 3.375 g  Status:  Discontinued     3.375 g 12.5 mL/hr over 240 Minutes Intravenous Every 8 hours 02/02/16 1453 02/02/16 1459   02/02/16 1330  vancomycin (VANCOCIN) 2,500 mg in sodium chloride 0.9 % 500 mL IVPB     2,500 mg 250 mL/hr over 120 Minutes Intravenous  Once 02/02/16 1310 02/02/16 1810   02/02/16 1300  piperacillin-tazobactam (ZOSYN) IVPB 3.375 g     3.375 g 100 mL/hr over 30 Minutes Intravenous  Once 02/02/16 1256 02/02/16 1412     Subjective: Pt reports pain right hand and reports history of gout.  He feels like this is similar to other gout attacks.   Objective: Filed Vitals:   02/04/16 1300 02/04/16 2051 02/04/16 2153 02/05/16 0502  BP: 137/83 134/82  154/88  Pulse: 78 88  86  Temp: 98.2 F (36.8 C) 99.9 F (37.7 C) 99.3 F (37.4 C) 100 F (37.8 C)  TempSrc: Oral Oral Oral Oral  Resp: 18 18  20   Height:      Weight:      SpO2: 99% 95%  97%    Intake/Output Summary (Last 24 hours)  at 02/05/16 0750 Last data filed at 02/05/16 0330  Gross per 24 hour  Intake    720 ml  Output   1500 ml  Net   -780 ml   Filed Weights   02/02/16 1057  Weight: 285 lb (129.275 kg)    Exam:  General: Appears calm and comfortable, obese no acute distress  Eyes: PERRL, EOMI, normal lids, iris  ENT: grossly normal hearing, lips & tongue, weakness membranes of his mouth are pink slightly dry  Neck: no LAD, masses or thyromegaly  Cardiovascular: RRR, no m/r/g. Trace LE edema.   Respiratory: CTA bilaterally, no w/r/r. Normal respiratory effort.  Abdomen: soft, ntnd, positive bowel sounds throughout no guarding or rebounding  Skin: no rash or induration seen on limited  exam  Musculoskeletal:swollen right wrist/hand, good ROM, left charcot foot, Left foot with 6 cm diameter raised fluctuant area base of great toe no erythema no heat no drainage appears to have a healing scab in the center, 2 cm ulcer with purulent drainage middle of underside foot, lateral aspect left foot with what appears to be an open blister draining thin pink fluid  Psychiatric: grossly normal mood and affect, speech fluent and appropriate, AOx3. Arthritic changes and swelling of right hand  Neurologic: CN 2-12 grossly intact, moves all extremities in coordinated fashion, sensation intact   Data Reviewed: Basic Metabolic Panel:  Recent Labs Lab 01/31/16 1408 02/02/16 1218 02/02/16 1509 02/03/16 0536 02/04/16 0211  NA 139 134*  --  137 137  K 5.3* 4.3  --  4.3 4.2  CL 105 105  --  106 107  CO2  --  21*  --  21* 22  GLUCOSE 113* 153*  --  116* 105*  BUN 29* 21*  --  24* 25*  CREATININE 1.20 1.57* 1.30* 1.69* 1.64*  CALCIUM  --  9.0  --  8.9 8.6*   Liver Function Tests:  Recent Labs Lab 02/02/16 1218 02/04/16 0211  AST 28 36  ALT 32 47  ALKPHOS 88 69  BILITOT 0.6 0.7  PROT 7.8 7.5  ALBUMIN 2.8* 2.4*   No results for input(s): LIPASE, AMYLASE in the last 168 hours. No results for input(s): AMMONIA in the last 168 hours. CBC:  Recent Labs Lab 01/31/16 1408 02/02/16 1218 02/03/16 0536 02/04/16 0211  WBC  --  16.8* 13.0* 11.8*  NEUTROABS  --  11.6*  --  7.2  HGB 12.9* 10.7* 10.5* 9.9*  HCT 38.0* 32.1* 32.1* 29.5*  MCV  --  84.3 85.1 82.9  PLT  --  256 274 272   Cardiac Enzymes: No results for input(s): CKTOTAL, CKMB, CKMBINDEX, TROPONINI in the last 168 hours. BNP (last 3 results) No results for input(s): PROBNP in the last 8760 hours. CBG:  Recent Labs Lab 02/04/16 0610 02/04/16 1120 02/04/16 1619 02/04/16 2152 02/05/16 0645  GLUCAP 128* 189* 120* 117* 122*    Recent Results (from the past 240 hour(s))  Culture, blood (Routine x 2)      Status: None (Preliminary result)   Collection Time: 02/02/16 11:45 AM  Result Value Ref Range Status   Specimen Description BLOOD RIGHT FOREARM  Final   Special Requests BOTTLES DRAWN AEROBIC AND ANAEROBIC  5CC  Final   Culture NO GROWTH 2 DAYS  Final   Report Status PENDING  Incomplete  Culture, blood (Routine x 2)     Status: None (Preliminary result)   Collection Time: 02/02/16 11:55 AM  Result Value Ref Range Status  Specimen Description BLOOD RIGHT HAND  Final   Special Requests BOTTLES DRAWN AEROBIC AND ANAEROBIC  5CC  Final   Culture NO GROWTH 2 DAYS  Final   Report Status PENDING  Incomplete  Urine culture     Status: None   Collection Time: 02/02/16  1:29 PM  Result Value Ref Range Status   Specimen Description URINE, CLEAN CATCH  Final   Special Requests NONE  Final   Culture NO GROWTH  Final   Report Status 02/03/2016 FINAL  Final    Studies: Mr Foot Left Wo Contrast  02/04/2016  CLINICAL DATA:  Charcot foot and osteomyelitis.  Diabetes. EXAM: MRI OF THE LEFT FOREFOOT WITHOUT CONTRAST TECHNIQUE: Multiplanar, multisequence MR imaging was performed. No intravenous contrast was administered. COMPARISON:  02/02/2016 radiographs FINDINGS: Chronic Charcot arthropathy with resorption of the vast majority of the midfoot and at the bases of the second through fifth metatarsals. Most of the talus is still present , and the calcaneus and a significant part of the proximal cuboid is still visible. Cuneiforms are completely fragmented and unrecognizable. In the expected location of the midfoot there is abnormal heterogeneous fluid signal intensity which appears to drain to the plantar foot as on image 28 series 12. This raises concern for superimposed infection, as draining sinus tracks are unusual from sterile Charcot arthropathy. The stranding appears to be occurring to the plantar -lateral surface. There is chronic sclerosis in the second through fifth metatarsals. At the midfoot defect,  there is varus angulation between the hindfoot and the remaining portions of the metatarsals. Extensive subcutaneous edema in the remaining foot with apparent blistering along the dorsum of the foot in the vicinity of the first MTP joint. The edema extends into the toes. As expected there is diffuse edema along the plantar musculature of the foot. Erosion along the medial malleolus noted for example on image 21/8. IMPRESSION: 1. Midfoot osteolysis with irregular calcifications corresponding to the original cuneiform is a and anterior cuboid, and bases of the second through fifth metatarsals. Much of this is likely from Charcot arthropathy. However, much of the midfoot is replaced by complex fluid which appears to be draining to the plantar and lateral skin surface -draining sinus tracks are much more characteristic of osteomyelitis rather than Charcot arthropathy and [place] the likelihood of superimposed chronic osteoarthritis seems high. 2. Blistering along the medial side of the first MTP joint with extensive edema tracking in the soft tissues of the foot. Erosion along the inferior margin of the medial malleolus and adjacent talus, although generally the talus and calcaneus are mostly intact otherwise. Electronically Signed   By: Van Clines M.D.   On: 02/04/2016 16:31   Scheduled Meds: . aspirin EC  81 mg Oral Daily  . colchicine  1.2 mg Oral NOW   Followed by  . colchicine  0.6 mg Oral Once  . fluticasone furoate-vilanterol  1 puff Inhalation Daily  . heparin  5,000 Units Subcutaneous Q12H  . insulin aspart  0-15 Units Subcutaneous TID WC  . insulin aspart  0-5 Units Subcutaneous QHS  . insulin glargine  10 Units Subcutaneous QHS  . piperacillin-tazobactam (ZOSYN)  IV  3.375 g Intravenous Q8H  . vancomycin  1,500 mg Intravenous Q24H   Continuous Infusions:   Principal Problem:   Osteomyelitis of foot, acute, left Active Problems:   Hypertension   Diabetes (HCC)   Diabetic foot (HCC)    Osteomyelitis (HCC)   CKD (chronic kidney disease), stage II  Anemia  Time spent:   Irwin Brakeman, MD, FAAFP [facility] Pager [phone]  If 7PM-7AM, please contact night-coverage [email or web address] Password [email or web address] 02/05/2016, 7:50 AM    LOS: 3 days

## 2016-02-05 NOTE — Progress Notes (Signed)
Patient ID: Troy [name], male   DOB: 05-05-1966, 50 y.o.   MRN: [id number] MRI shows Charcot changes and draining sinus to midfoot. Will proceed with BKA Wednesday afternoon .

## 2016-02-05 NOTE — Progress Notes (Signed)
Pharmacy Antibiotic Note  Troy [name] is a 50 y.o. male admitted on 02/02/2016 with osteomyelitis 2/2 diabetic foot ulcer. Pharmacy has been consulted for vanc and zosyn dosing. Pt is afebrile with WBC trending down from 13 to 11.8 on 7/10. Scr drifting up from 1.57>1.69>1.64 (baseline ~1.2); other nephrotoxins have been previously discontinued. BG is being controlled with lantus 10 units and SSI novolog. Left BKA planned for 7/12 based on 7/10 MRI positive for osteomyelitis.  Plan: Vancomycin 1500mg  IV every 24 hours. Goal trough 15-20 mcg/mL Zosyn 3.375g IV q8h (4 hour infusion) F/u AM labs tomorrow for changes in renal function Consider ordering VT tomorrow if Vanc continues  Height: 5\' 10"  (177.8 cm) Weight: 285 lb (129.275 kg) IBW/kg (Calculated) : 73  Temp (24hrs), Avg:99.1 F (37.3 C), Min:98.2 F (36.8 C), Max:100 F (37.8 C)   Recent Labs Lab 01/31/16 1408 02/02/16 1218 02/02/16 1222 02/02/16 1509 02/02/16 1520 02/03/16 0536 02/04/16 0211  WBC  --  16.8*  --   --   --  13.0* 11.8*  CREATININE 1.20 1.57*  --  1.30*  --  1.69* 1.64*  LATICACIDVEN  --   --  1.61  --  1.17  --   --     Estimated Creatinine Clearance: 71.2 mL/min (by C-G formula based on Cr of 1.64).   Normalized CrCl = ~50-60 mL/min  No Known Allergies  Antimicrobials this admission: Vanc 7/8 >>  Zosyn 7/8 >>   Dose adjustments this admission: n/a  Microbiology results: 7/8 BCx: ngx2d 7/8 UCx: ng(f)   Thank you for allowing pharmacy to be a part of this patient's care.  Belia Heman, PharmD PGY1 Resident 02/05/2016 9:39 AM

## 2016-02-06 ENCOUNTER — Inpatient Hospital Stay (HOSPITAL_COMMUNITY): Payer: BLUE CROSS/BLUE SHIELD | Admitting: Anesthesiology

## 2016-02-06 ENCOUNTER — Encounter (HOSPITAL_COMMUNITY)
Admission: EM | Disposition: A | Discharge status: Home Health | Payer: Self-pay | Source: Home / Self Care | Attending: Family Medicine

## 2016-02-06 ENCOUNTER — Other Ambulatory Visit: Payer: Self-pay | Admitting: Orthopaedic Surgery

## 2016-02-06 ENCOUNTER — Encounter (HOSPITAL_COMMUNITY): Payer: Self-pay | Admitting: Anesthesiology

## 2016-02-06 DIAGNOSIS — E1022 Type 1 diabetes mellitus with diabetic chronic kidney disease: Secondary | ICD-10-CM

## 2016-02-06 DIAGNOSIS — N183 Chronic kidney disease, stage 3 (moderate): Secondary | ICD-10-CM

## 2016-02-06 HISTORY — PX: AMPUTATION: SHX166

## 2016-02-06 LAB — GLUCOSE, CAPILLARY
GLUCOSE-CAPILLARY: 90 mg/dL (ref 65–99)
GLUCOSE-CAPILLARY: 86 mg/dL (ref 65–99)
GLUCOSE-CAPILLARY: 117 mg/dL — AB (ref 65–99)
Glucose-Capillary: 87 mg/dL (ref 65–99)

## 2016-02-06 LAB — COMPREHENSIVE METABOLIC PANEL
ALBUMIN: 2.2 g/dL — AB (ref 3.5–5.0)
ALT: 39 U/L (ref 17–63)
ANION GAP: 8 (ref 5–15)
AST: 29 U/L (ref 15–41)
Alkaline Phosphatase: 67 U/L (ref 38–126)
BILIRUBIN TOTAL: 0.6 mg/dL (ref 0.3–1.2)
BUN: 22 mg/dL — ABNORMAL HIGH (ref 6–20)
CO2: 23 mmol/L (ref 22–32)
Calcium: 8.4 mg/dL — ABNORMAL LOW (ref 8.9–10.3)
Chloride: 105 mmol/L (ref 101–111)
Creatinine, Ser: 1.68 mg/dL — ABNORMAL HIGH (ref 0.61–1.24)
GFR calc Af Amer: 52 mL/min — ABNORMAL LOW (ref >60)
GFR calc non Af Amer: 45 mL/min — ABNORMAL LOW (ref >60)
GLUCOSE: 108 mg/dL — AB (ref 65–99)
POTASSIUM: 4.1 mmol/L (ref 3.5–5.1)
SODIUM: 136 mmol/L (ref 135–145)
TOTAL PROTEIN: 7.1 g/dL (ref 6.5–8.1)

## 2016-02-06 LAB — CBC
HCT: 29.1 % — ABNORMAL LOW (ref 39.0–52.0)
Hemoglobin: 9.6 g/dL — ABNORMAL LOW (ref 13.0–17.0)
MCH: 27.8 pg (ref 26.0–34.0)
MCHC: 33 g/dL (ref 30.0–36.0)
MCV: 84.3 fL (ref 78.0–100.0)
Platelets: 287 10*3/uL (ref 150–400)
RBC: 3.45 MIL/uL — ABNORMAL LOW (ref 4.22–5.81)
RDW: 15.3 % (ref 11.5–15.5)
WBC: 12.7 10*3/uL — ABNORMAL HIGH (ref 4.0–10.5)

## 2016-02-06 LAB — URIC ACID: Uric Acid, Serum: 6.5 mg/dL (ref 4.4–7.6)

## 2016-02-06 LAB — VANCOMYCIN, TROUGH: Vancomycin Tr: 20 ug/mL (ref 15–20)

## 2016-02-06 SURGERY — AMPUTATION BELOW KNEE
Anesthesia: General | Laterality: Left

## 2016-02-06 MED ORDER — HYDRALAZINE HCL 20 MG/ML IJ SOLN: 10.0000 mg | Freq: Four times a day (QID) | INTRAMUSCULAR | Status: DC | PRN

## 2016-02-06 MED ORDER — ACETAMINOPHEN 325 MG PO TABS: 650.0000 mg | ORAL_TABLET | Freq: Four times a day (QID) | ORAL | Status: DC | PRN

## 2016-02-06 MED ORDER — PROMETHAZINE HCL 25 MG/ML IJ SOLN: 6.2500 mg | INTRAMUSCULAR | Status: DC | PRN

## 2016-02-06 MED ORDER — METOCLOPRAMIDE HCL 5 MG PO TABS
5.0000 mg | ORAL_TABLET | Freq: Three times a day (TID) | ORAL | Status: DC | PRN
Start: 2016-02-06 — End: 2016-02-09

## 2016-02-06 MED ORDER — SUCCINYLCHOLINE CHLORIDE 20 MG/ML IJ SOLN
INTRAMUSCULAR | Status: DC | PRN
Start: 2016-02-06 — End: 2016-02-06
  Administered 2016-02-06: 100 mg via INTRAVENOUS

## 2016-02-06 MED ORDER — HYDROMORPHONE HCL 1 MG/ML IJ SOLN
0.2500 mg | INTRAMUSCULAR | Status: DC | PRN
  Administered 2016-02-06 (×2): 0.5 mg via INTRAVENOUS

## 2016-02-06 MED ORDER — METHOCARBAMOL 500 MG PO TABS
500.0000 mg | ORAL_TABLET | Freq: Four times a day (QID) | ORAL | Status: DC | PRN
  Administered 2016-02-06: 500 mg via ORAL
  Filled 2016-02-06: qty 1

## 2016-02-06 MED ORDER — METHYLPREDNISOLONE SODIUM SUCC 125 MG IJ SOLR: 80.0000 mg | Freq: Once | INTRAMUSCULAR | Status: DC

## 2016-02-06 MED ORDER — HYDROMORPHONE HCL 1 MG/ML IJ SOLN
1.0000 mg | INTRAMUSCULAR | Status: DC | PRN
  Administered 2016-02-06 – 2016-02-07 (×3): 1 mg via INTRAVENOUS
  Filled 2016-02-06 (×3): qty 1

## 2016-02-06 MED ORDER — POLYETHYLENE GLYCOL 3350 17 G PO PACK
17.0000 g | PACK | Freq: Every day | ORAL | Status: DC | PRN
  Administered 2016-02-07 – 2016-02-08 (×2): 17 g via ORAL
  Filled 2016-02-06 (×2): qty 1

## 2016-02-06 MED ORDER — EPHEDRINE 5 MG/ML INJ
INTRAVENOUS | Status: AC
  Filled 2016-02-06: qty 10

## 2016-02-06 MED ORDER — EPHEDRINE SULFATE 50 MG/ML IJ SOLN
INTRAMUSCULAR | Status: DC | PRN
Start: 2016-02-06 — End: 2016-02-06
  Administered 2016-02-06: 15 mg via INTRAVENOUS
  Administered 2016-02-06: 10 mg via INTRAVENOUS

## 2016-02-06 MED ORDER — MIDAZOLAM HCL 5 MG/5ML IJ SOLN
INTRAMUSCULAR | Status: DC | PRN
  Administered 2016-02-06: 2 mg via INTRAVENOUS

## 2016-02-06 MED ORDER — METHOCARBAMOL 1000 MG/10ML IJ SOLN
500.0000 mg | Freq: Four times a day (QID) | INTRAMUSCULAR | Status: DC | PRN
  Filled 2016-02-06: qty 5

## 2016-02-06 MED ORDER — GLYCOPYRROLATE 0.2 MG/ML IV SOSY
PREFILLED_SYRINGE | INTRAVENOUS | Status: AC
  Filled 2016-02-06: qty 3

## 2016-02-06 MED ORDER — MIDAZOLAM HCL 2 MG/2ML IJ SOLN
INTRAMUSCULAR | Status: AC
  Filled 2016-02-06: qty 2

## 2016-02-06 MED ORDER — FENTANYL CITRATE (PF) 250 MCG/5ML IJ SOLN
INTRAMUSCULAR | Status: AC
  Filled 2016-02-06: qty 5

## 2016-02-06 MED ORDER — DOCUSATE SODIUM 100 MG PO CAPS
100.0000 mg | ORAL_CAPSULE | Freq: Two times a day (BID) | ORAL | Status: DC
Start: 2016-02-06 — End: 2016-02-09
  Administered 2016-02-06 – 2016-02-09 (×7): 100 mg via ORAL
  Filled 2016-02-06 (×6): qty 1

## 2016-02-06 MED ORDER — OXYCODONE HCL 5 MG PO TABS
5.0000 mg | ORAL_TABLET | ORAL | Status: DC | PRN
  Administered 2016-02-06: 10 mg via ORAL
  Administered 2016-02-07 – 2016-02-09 (×7): 5 mg via ORAL
  Filled 2016-02-06 (×8): qty 1

## 2016-02-06 MED ORDER — PROPOFOL 10 MG/ML IV BOLUS
INTRAVENOUS | Status: DC | PRN
  Administered 2016-02-06: 100 mg via INTRAVENOUS
  Administered 2016-02-06: 200 mg via INTRAVENOUS

## 2016-02-06 MED ORDER — METHOCARBAMOL 500 MG PO TABS
ORAL_TABLET | ORAL | Status: AC
  Filled 2016-02-06: qty 1

## 2016-02-06 MED ORDER — MEPERIDINE HCL 25 MG/ML IJ SOLN: 6.2500 mg | INTRAMUSCULAR | Status: DC | PRN

## 2016-02-06 MED ORDER — PHENYLEPHRINE HCL 10 MG/ML IJ SOLN
INTRAMUSCULAR | Status: DC | PRN
  Administered 2016-02-06 (×2): 120 ug via INTRAVENOUS

## 2016-02-06 MED ORDER — HYDROMORPHONE HCL 1 MG/ML IJ SOLN
INTRAMUSCULAR | Status: AC
  Administered 2016-02-06: 0.5 mg via INTRAVENOUS
  Filled 2016-02-06: qty 1

## 2016-02-06 MED ORDER — PROPOFOL 10 MG/ML IV BOLUS
INTRAVENOUS | Status: AC
  Filled 2016-02-06: qty 20

## 2016-02-06 MED ORDER — METOCLOPRAMIDE HCL 5 MG/ML IJ SOLN
5.0000 mg | Freq: Three times a day (TID) | INTRAMUSCULAR | Status: DC | PRN
Start: 2016-02-06 — End: 2016-02-09

## 2016-02-06 MED ORDER — SODIUM CHLORIDE 0.9 % IV SOLN
INTRAVENOUS | Status: DC
  Administered 2016-02-06: 14:00:00 via INTRAVENOUS

## 2016-02-06 MED ORDER — ACETAMINOPHEN 650 MG RE SUPP: 650.0000 mg | Freq: Four times a day (QID) | RECTAL | Status: DC | PRN

## 2016-02-06 MED ORDER — LACTATED RINGERS IV SOLN
INTRAVENOUS | Status: DC | PRN
  Administered 2016-02-06: 15:00:00 via INTRAVENOUS

## 2016-02-06 MED ORDER — FENTANYL CITRATE (PF) 100 MCG/2ML IJ SOLN
INTRAMUSCULAR | Status: DC | PRN
  Administered 2016-02-06 (×6): 50 ug via INTRAVENOUS
  Administered 2016-02-06: 100 ug via INTRAVENOUS
  Administered 2016-02-06: 50 ug via INTRAVENOUS
  Administered 2016-02-06: 100 ug via INTRAVENOUS
  Administered 2016-02-06 (×4): 50 ug via INTRAVENOUS

## 2016-02-06 MED ORDER — LIDOCAINE HCL (CARDIAC) 20 MG/ML IV SOLN
INTRAVENOUS | Status: DC | PRN
  Administered 2016-02-06: 60 mg via INTRAVENOUS

## 2016-02-06 MED ORDER — ONDANSETRON HCL 4 MG PO TABS: 4.0000 mg | ORAL_TABLET | Freq: Four times a day (QID) | ORAL | Status: DC | PRN

## 2016-02-06 MED ORDER — ONDANSETRON HCL 4 MG/2ML IJ SOLN: 4.0000 mg | Freq: Four times a day (QID) | INTRAMUSCULAR | Status: DC | PRN

## 2016-02-06 MED ORDER — OXYCODONE HCL 5 MG PO TABS
ORAL_TABLET | ORAL | Status: AC
  Filled 2016-02-06: qty 2

## 2016-02-06 SURGICAL SUPPLY — 55 items
BANDAGE ACE 4X5 VEL STRL LF (GAUZE/BANDAGES/DRESSINGS) ×1 IMPLANT
BANDAGE ELASTIC 4 VELCRO ST LF (GAUZE/BANDAGES/DRESSINGS) ×2 IMPLANT
BANDAGE ELASTIC 6 VELCRO ST LF (GAUZE/BANDAGES/DRESSINGS) ×2 IMPLANT
BANDAGE ESMARK 6X9 LF (GAUZE/BANDAGES/DRESSINGS) IMPLANT
BLADE SAW SAG 29X58X.64 (BLADE) ×2 IMPLANT
BNDG CMPR 9X6 STRL LF SNTH (GAUZE/BANDAGES/DRESSINGS)
BNDG COHESIVE 6X5 TAN STRL LF (GAUZE/BANDAGES/DRESSINGS) ×2 IMPLANT
BNDG ESMARK 6X9 LF (GAUZE/BANDAGES/DRESSINGS)
BNDG GAUZE ELAST 4 BULKY (GAUZE/BANDAGES/DRESSINGS) ×3 IMPLANT
CLEANER TIP ELECTROSURG 2X2 (MISCELLANEOUS) ×2 IMPLANT
COVER SURGICAL LIGHT HANDLE (MISCELLANEOUS) ×2 IMPLANT
CUFF TOURNIQUET SINGLE 34IN LL (TOURNIQUET CUFF) ×1 IMPLANT
CUFF TOURNIQUET SINGLE 44IN (TOURNIQUET CUFF) IMPLANT
DRAIN PENROSE 1/2X12 LTX STRL (WOUND CARE) IMPLANT
DRAPE EXTREMITY T 121X128X90 (DRAPE) ×2 IMPLANT
DRAPE INCISE IOBAN 66X45 STRL (DRAPES) ×2 IMPLANT
DRAPE ORTHO SPLIT 77X108 STRL (DRAPES) ×2
DRAPE PROXIMA HALF (DRAPES) ×4 IMPLANT
DRAPE SURG ORHT 6 SPLT 77X108 (DRAPES) ×1 IMPLANT
DRAPE U-SHAPE 47X51 STRL (DRAPES) ×2 IMPLANT
DURAPREP 26ML APPLICATOR (WOUND CARE) ×2 IMPLANT
EVACUATOR 1/8 PVC DRAIN (DRAIN) IMPLANT
GAUZE SPONGE 4X4 12PLY STRL (GAUZE/BANDAGES/DRESSINGS) ×3 IMPLANT
GAUZE XEROFORM 5X9 LF (GAUZE/BANDAGES/DRESSINGS) ×2 IMPLANT
GLOVE BIOGEL PI IND STRL 8 (GLOVE) ×2 IMPLANT
GLOVE BIOGEL PI INDICATOR 8 (GLOVE) ×2
GLOVE ORTHO TXT STRL SZ7.5 (GLOVE) ×4 IMPLANT
GOWN STRL REUS W/ TWL LRG LVL3 (GOWN DISPOSABLE) ×1 IMPLANT
GOWN STRL REUS W/ TWL XL LVL3 (GOWN DISPOSABLE) ×1 IMPLANT
GOWN STRL REUS W/TWL 2XL LVL3 (GOWN DISPOSABLE) ×2 IMPLANT
GOWN STRL REUS W/TWL LRG LVL3 (GOWN DISPOSABLE) ×2
GOWN STRL REUS W/TWL XL LVL3 (GOWN DISPOSABLE) ×2
KIT BASIN OR (CUSTOM PROCEDURE TRAY) ×2 IMPLANT
KIT ROOM TURNOVER OR (KITS) ×2 IMPLANT
MANIFOLD NEPTUNE II (INSTRUMENTS) ×2 IMPLANT
NDL MAYO TROCAR (NEEDLE) ×1 IMPLANT
NEEDLE MAYO TROCAR (NEEDLE) ×2 IMPLANT
NS IRRIG 1000ML POUR BTL (IV SOLUTION) ×2 IMPLANT
PACK GENERAL/GYN (CUSTOM PROCEDURE TRAY) ×2 IMPLANT
PAD ABD 8X10 STRL (GAUZE/BANDAGES/DRESSINGS) ×1 IMPLANT
PAD ARMBOARD 7.5X6 YLW CONV (MISCELLANEOUS) ×4 IMPLANT
SPONGE GAUZE 4X4 12PLY STER LF (GAUZE/BANDAGES/DRESSINGS) ×1 IMPLANT
SPONGE LAP 18X18 X RAY DECT (DISPOSABLE) IMPLANT
STAPLER VISISTAT 35W (STAPLE) ×3 IMPLANT
STOCKINETTE IMPERVIOUS LG (DRAPES) IMPLANT
SUT SILK 3 0 SH CR/8 (SUTURE) ×2 IMPLANT
SUT VIC AB 0 CT1 27 (SUTURE) ×4
SUT VIC AB 0 CT1 27XBRD ANBCTR (SUTURE) ×1 IMPLANT
SUT VIC AB 1 CTX 18 (SUTURE) ×4 IMPLANT
SUT VIC AB 2-0 CT1 18 (SUTURE) ×1 IMPLANT
SUT VICRYL 0 TIES 12 18 (SUTURE) ×2 IMPLANT
SUT VICRYL AB 2 0 TIES (SUTURE) ×2 IMPLANT
TOWEL OR 17X24 6PK STRL BLUE (TOWEL DISPOSABLE) ×2 IMPLANT
TOWEL OR 17X26 10 PK STRL BLUE (TOWEL DISPOSABLE) ×2 IMPLANT
WATER STERILE IRR 1000ML POUR (IV SOLUTION) ×2 IMPLANT

## 2016-02-06 NOTE — H&P (View-Only) (Signed)
Patient ID: Troy [name] MRN: [id number] DOB/AGE: 1966-01-29 50 y.o.  Admit date: 02/02/2016  Admission Diagnoses:  Principal Problem:   Osteomyelitis of foot, acute, left Active Problems:   Hypertension   Diabetes ([place])   Diabetic foot ([place])   Osteomyelitis (HCC)   CKD (chronic kidney disease), stage II   Anemia   HPI: Patient being seen at the request of hospitalist for left foot infection.  Patient states that he has had chronic foot problems.  Seen by PCP in January 2017 and had xray left foot done.  Report read severe destructive changes noted of the foot and ankle consistent with charcot's joints.  Chronic infection could present in this fashion as well.  Subluxation of the second MTP joint is present. Patient was referred to Dr Paulla Dolly podiatrist shortly after and states that different shoes were recommended.  Did not have MRI or return appointment.  Seen at urgent care 31 January 2016 with fever.  Returned to ED after he began having left foot drainage and foul odor.    Past Medical History: Past Medical History  Diagnosis Date  . Hypertension   . Diabetes mellitus   . Gout   . Bronchitis   . Kidney stones 11/30/2013  . Asthma   . Neuromuscular disorder ([place])   . Obesity   . CKD (chronic kidney disease)   . Diabetic Charcot foot ([place])     Left  . Polyneuropathy in diabetes [facility])     Surgical History: Past Surgical History  Procedure Laterality Date  . No past surgeries    . Cholecystectomy N/A 12/03/2013    Procedure: LAPAROSCOPIC CHOLECYSTECTOMY WITH INTRAOPERATIVE CHOLANGIOGRAM;  Surgeon: Rolm Bookbinder, MD;  Location: [facility] OR;  Service: General;  Laterality: N/A;    Family History: Family History  Problem Relation Age of Onset  . Diabetes Mellitus II Father   . Diabetes Father   . Hypertension Father   . CAD Neg Hx   . Diabetes Mother   . Hypertension Mother     Social History: Social History   Social History  . Marital Status: Married    Spouse  Name: N/A  . Number of Children: N/A  . Years of Education: N/A   Occupational History  . Not on file.   Social History Main Topics  . Smoking status: Never Smoker   . Smokeless tobacco: Not on file  . Alcohol Use: Yes     Comment: occasionally  . Drug Use: No  . Sexual Activity: Not on file   Other Topics Concern  . Not on file   Social History Narrative    Allergies: Review of patient's allergies indicates no known allergies.  Medications: I have reviewed the patient's current medications.  Vital Signs: Patient Vitals for the past 24 hrs:  BP Temp Temp src Pulse Resp SpO2  02/04/16 0520 (!) 142/87 mmHg 99.2 F (37.3 C) Oral 81 18 98 %  02/03/16 2000 129/79 mmHg 99 F (37.2 C) Oral 80 18 99 %  02/03/16 1544 132/75 mmHg 99.2 F (37.3 C) Oral 82 18 99 %    Radiology: Dg Chest 2 View  02/02/2016  CLINICAL DATA:  Cough EXAM: CHEST  2 VIEW COMPARISON:  01/31/2016 chest radiograph. FINDINGS: Stable cardiomediastinal silhouette with normal heart size. No pneumothorax. No pleural effusion. Lungs appear clear, with no acute consolidative airspace disease and no pulmonary edema. IMPRESSION: No active cardiopulmonary disease. Electronically Signed   By: Ilona Sorrel M.D.   On: 02/02/2016 11:27  Dg Chest 2 View  01/31/2016  CLINICAL DATA:  Shortness of breath with wheezing and fever EXAM: CHEST  2 VIEW COMPARISON:  Chest radiograph and chest CT August 25, 2015 FINDINGS: There is no apparent edema or consolidation. Heart size and pulmonary vascularity are normal. No adenopathy. There is degenerative change in the thoracic spine. IMPRESSION: No edema or consolidation. Electronically Signed   By: Lowella Grip III M.D.   On: 01/31/2016 14:20   Dg Foot Complete Left  02/02/2016  CLINICAL DATA:  Ulcers along foot.  Charcot foot. EXAM: LEFT FOOT - COMPLETE 3+ VIEW COMPARISON:  Plain films LEFT foot, 08/17/2015 FINDINGS: Compared to exam of 08/17/2015, there is interval osseous erosion  at the base of the fifth, fourth, third, and second metatarsals. There is bone loss within the tarsal bones with noticeable bone loss in cuneiforms. There is a robust periosteal reaction along the shafts of the metatarsals with dense dense sclerosis of the metatarsals proximal. There is interval increase in soft tissue swelling throughout the midfoot. Small amount subcutaneous gas along the plantar surface. IMPRESSION: Interval severe osseous erosion involving the proximal metatarsals and tarsal bones along the metatarsal tarsal joints. Interval marked soft tissue swelling. Differential includes aggressive erosive arthropathy versus OSTEOMYELITIS. Favor severe osteomyelitis. Electronically Signed   By: Suzy Bouchard M.D.   On: 02/02/2016 13:34    Labs:  Recent Labs  02/03/16 0536 02/04/16 0211  WBC 13.0* 11.8*  RBC 3.77* 3.56*  HCT 32.1* 29.5*  PLT 274 272    Recent Labs  02/03/16 0536 02/04/16 0211  NA 137 137  K 4.3 4.2  CL 106 107  CO2 21* 22  BUN 24* 25*  CREATININE 1.69* 1.64*  GLUCOSE 116* 105*  CALCIUM 8.9 8.6*   No results for input(s): LABPT, INR in the last 72 hours.  Review of Systems: Review of Systems  Constitutional: Negative.   HENT: Negative.   Eyes: Negative.   Respiratory: Negative.   Cardiovascular: Negative.   Gastrointestinal: Negative.   Genitourinary: Negative.   Neurological: Positive for sensory change.  Psychiatric/Behavioral: Negative.     Physical Exam: [name] alert and oriented and in NAD.  Head normocephalic, atraumatic.   No respiratory distress Abdomen round nondistended.  Left foot markedly swollen.  Draining ulcers medial, plantar and lateral foot.  Calf nontender.    Assessment and Plan: Left foot infection.   Will get MRI left foot to better evaluate infection and osteomyelitis.  xrays from January and most recent reviewed with patient and family members.  Continue current treatment plan.  Elevate foot above heart level.  History and treatment plan reviewed with my attending Dr Rodell Perna.    Alyson Locket. Alvino Blood For Rodell Perna MD [facility] orthopedics [phone] Bilateral charcot midfoot breakdown with osteolysis and left  foot midfoot osteomyelitis. Will need left Below knee amputation. MRI pending to confirm Osteomyelitis.  Discuss BKA prothesis once swelling down in a few months, stump shrinker sock. Has multiple steps front and back door. MRI ordered.

## 2016-02-06 NOTE — Anesthesia Procedure Notes (Signed)
Procedure Name: Intubation Date/Time: 02/06/2016 2:39 PM Performed by: Neldon Newport Pre-anesthesia Checklist: Timeout performed, Patient being monitored, Suction available, Emergency Drugs available and Patient identified Patient Re-evaluated:Patient Re-evaluated prior to inductionOxygen Delivery Method: Circle system utilized Preoxygenation: Pre-oxygenation with 100% oxygen Intubation Type: IV induction Ventilation: Mask ventilation without difficulty Laryngoscope Size: Mac and 4 Grade View: Grade I Tube type: Oral Number of attempts: 1 Placement Confirmation: breath sounds checked- equal and bilateral,  positive ETCO2 and ETT inserted through vocal cords under direct vision Secured at: 24 cm Tube secured with: Tape Dental Injury: Teeth and Oropharynx as per pre-operative assessment

## 2016-02-06 NOTE — Anesthesia Preprocedure Evaluation (Signed)
Anesthesia Evaluation  Patient identified by MRN, date of birth, ID band Patient awake    Reviewed: Allergy & Precautions, H&P , NPO status , Patient's Chart, lab work & pertinent test results  Airway Mallampati: II  TM Distance: >3 FB Neck ROM: full    Dental no notable dental hx.    Pulmonary    Pulmonary exam normal        Cardiovascular Exercise Tolerance: Good hypertension, Pt. on medications Normal cardiovascular exam     Neuro/Psych negative psych ROS   GI/Hepatic negative GI ROS, Neg liver ROS,   Endo/Other  diabetes  Renal/GU   negative genitourinary   Musculoskeletal   Abdominal (+) + obese,   Peds  Hematology   Anesthesia Other Findings   Reproductive/Obstetrics negative OB ROS                             Anesthesia Physical Anesthesia Plan  ASA: III  Anesthesia Plan: General   Post-op Pain Management:    Induction: Intravenous  Airway Management Planned: LMA and Oral ETT  Additional Equipment:   Intra-op Plan:   Post-operative Plan: Extubation in OR  Informed Consent: I have reviewed the patients History and Physical, chart, labs and discussed the procedure including the risks, benefits and alternatives for the proposed anesthesia with the patient or authorized representative who has indicated his/her understanding and acceptance.   Dental Advisory Given  Plan Discussed with: CRNA and Surgeon  Anesthesia Plan Comments:         Anesthesia Quick Evaluation

## 2016-02-06 NOTE — Anesthesia Postprocedure Evaluation (Signed)
Anesthesia Post Note  Patient: Troy [name]  Procedure(s) Performed: Procedure(s) (LRB): AMPUTATION BELOW KNEE (Left)  Patient location during evaluation: PACU Anesthesia Type: General Level of consciousness: awake and alert Pain management: pain level controlled Vital Signs Assessment: post-procedure vital signs reviewed and stable Respiratory status: spontaneous breathing, nonlabored ventilation, respiratory function stable and patient connected to nasal cannula oxygen Cardiovascular status: blood pressure returned to baseline and stable Postop Assessment: no signs of nausea or vomiting Anesthetic complications: no    Last Vitals:  Filed Vitals:   02/06/16 1715 02/06/16 1720  BP:  160/96  Pulse: 92 92  Temp:  36.6 C  Resp: 16 18    Last Pain:  Filed Vitals:   02/06/16 1722  PainSc: 4                  Tiajuana Amass

## 2016-02-06 NOTE — Progress Notes (Signed)
PROGRESS NOTE        PATIENT DETAILS Name: Troy [name] Age: 50 y.o. Sex: male Date of Birth: Feb 10, 1966 Admit Date: 02/02/2016 Admitting Physician Geradine Girt, DO WE:[id number], MD  Brief Narrative: Patient is a 50 y.o. male with a past medical history of diabetes, Charcot foot admitted with worsening swelling of his left foot, further evaluation revealed osteomyelitis. Seen by orthopedics, scheduled for left BKA later today.  Subjective: Complains of mild pain in his right hand area-feels like this is his usual gout flare.  Assessment/Plan: Principal Problem: Osteomyelitis of foot, acute, left: Continue empiric Zosyn/vancomycin, blood cultures negative so far. Seen by orthopedics, plans are for left BKA later today.  Active Problems: Acute gouty arthritis right hand: No response to colcihicine-will try Steroids-will reassess in am   Insulin-dependent type 2 diabetes: CBGs stable, continue 10 units of Lantus and SSI.  Hypertension: Blood pressure currently stable without the use of any antihypertensives, follow for now.  Chronic kidney disease stage III: Creatinine still close to usual baseline, monitor periodically.  Anemia: Likely has anemia related to acute illness and underlying chronic kidney disease. Follow hemoglobin.  Peripheral neuropathy: Secondary to diabetes, will need outpatient follow-up with PCP and primary podiatrist.  History of bronchial asthma: Lungs clear, continue Breo and prn bronchodilators.  DVT Prophylaxis: Prophylactic Heparin   Code Status: Full code   Family Communication: None at bedside-patient awake and alert-understanding of the above noted plan  Disposition Plan: Remain inpatient-but will plan on Home health vs SNF on discharge-await PT eval post op to determine disposition  Antimicrobial agents: Intravenous vancomycin 7/8>> Intravenous Zosyn 7/8>>  Procedures: None  CONSULTS:  orthopedic  surgery  Time spent: 25 minutes-Greater than 50% of this time was spent in counseling, explanation of diagnosis, planning of further management, and coordination of care.  MEDICATIONS: Anti-infectives    Start     Dose/Rate Route Frequency Ordered Stop   02/03/16 1300  vancomycin (VANCOCIN) 1,500 mg in sodium chloride 0.9 % 500 mL IVPB     1,500 mg 250 mL/hr over 120 Minutes Intravenous Every 24 hours 02/02/16 1310     02/02/16 2000  piperacillin-tazobactam (ZOSYN) IVPB 3.375 g     3.375 g 12.5 mL/hr over 240 Minutes Intravenous Every 8 hours 02/02/16 1459     02/02/16 1930  piperacillin-tazobactam (ZOSYN) IVPB 3.375 g  Status:  Discontinued     3.375 g 12.5 mL/hr over 240 Minutes Intravenous Every 8 hours 02/02/16 1453 02/02/16 1459   02/02/16 1330  vancomycin (VANCOCIN) 2,500 mg in sodium chloride 0.9 % 500 mL IVPB     2,500 mg 250 mL/hr over 120 Minutes Intravenous  Once 02/02/16 1310 02/02/16 1810   02/02/16 1300  piperacillin-tazobactam (ZOSYN) IVPB 3.375 g     3.375 g 100 mL/hr over 30 Minutes Intravenous  Once 02/02/16 1256 02/02/16 1412      Scheduled Meds: . aspirin EC  81 mg Oral Daily  . Chlorhexidine Gluconate Cloth  6 each Topical Q0600  . fluticasone furoate-vilanterol  1 puff Inhalation Daily  . heparin  5,000 Units Subcutaneous Q12H  . insulin aspart  0-15 Units Subcutaneous TID WC  . insulin aspart  0-5 Units Subcutaneous QHS  . insulin glargine  10 Units Subcutaneous QHS  . mupirocin ointment   Nasal BID  . piperacillin-tazobactam (ZOSYN)  IV  3.375 g  Intravenous Q8H  . vancomycin  1,500 mg Intravenous Q24H   Continuous Infusions:  PRN Meds:.acetaminophen **OR** acetaminophen, albuterol, bisacodyl, dextromethorphan-guaiFENesin, HYDROcodone-acetaminophen, HYDROmorphone (DILAUDID) injection, ondansetron **OR** ondansetron (ZOFRAN) IV, sodium chloride   PHYSICAL EXAM: Vital signs: Filed Vitals:   02/05/16 1258 02/05/16 1900 02/06/16 0514 02/06/16 1207  BP:  126/73 128/74 144/80 145/91  Pulse: 80 78 81 80  Temp: 98.9 F (37.2 C) 99.1 F (37.3 C) 99.5 F (37.5 C) 98.6 F (37 C)  TempSrc: Oral Oral Oral Oral  Resp: 16 18 18 18   Height:      Weight:      SpO2:  95% 100% 97%   Filed Weights   02/02/16 1057  Weight: 129.275 kg (285 lb)   Body mass index is 40.89 kg/(m^2).   Gen Exam: Awake and alert with clear speech. Not in any distress  Neck: Supple, No JVD.   Chest: B/L Clear.   CVS: S1 S2 Regular, no murmurs.  Abdomen: soft, BS +, non tender, non distended.  Extremities: Left lower extremity-N dressing-did not open. Some mild swelling in the right dorsal hand near the proximal thumb and index finger Neurologic: Non Focal.   Skin: No Rash or lesions   Wounds: N/A.   LABORATORY DATA: CBC:  Recent Labs Lab 01/31/16 1408 02/02/16 1218 02/03/16 0536 02/04/16 0211 02/06/16 0224  WBC  --  16.8* 13.0* 11.8* 12.7*  NEUTROABS  --  11.6*  --  7.2  --   HGB 12.9* 10.7* 10.5* 9.9* 9.6*  HCT 38.0* 32.1* 32.1* 29.5* 29.1*  MCV  --  84.3 85.1 82.9 84.3  PLT  --  256 274 272 [id number]    Basic Metabolic Panel:  Recent Labs Lab 01/31/16 1408 02/02/16 1218 02/02/16 1509 02/03/16 0536 02/04/16 0211 02/06/16 0224  NA 139 134*  --  137 137 136  K 5.3* 4.3  --  4.3 4.2 4.1  CL 105 105  --  106 107 105  CO2  --  21*  --  21* 22 23  GLUCOSE 113* 153*  --  116* 105* 108*  BUN 29* 21*  --  24* 25* 22*  CREATININE 1.20 1.57* 1.30* 1.69* 1.64* 1.68*  CALCIUM  --  9.0  --  8.9 8.6* 8.4*    GFR: Estimated Creatinine Clearance: 69.5 mL/min (by C-G formula based on Cr of 1.68).  Liver Function Tests:  Recent Labs Lab 02/02/16 1218 02/04/16 0211 02/06/16 0224  AST 28 36 29  ALT 32 47 39  ALKPHOS 88 69 67  BILITOT 0.6 0.7 0.6  PROT 7.8 7.5 7.1  ALBUMIN 2.8* 2.4* 2.2*   No results for input(s): LIPASE, AMYLASE in the last 168 hours. No results for input(s): AMMONIA in the last 168 hours.  Coagulation Profile: No results for  input(s): INR, PROTIME in the last 168 hours.  Cardiac Enzymes: No results for input(s): CKTOTAL, CKMB, CKMBINDEX, TROPONINI in the last 168 hours.  BNP (last 3 results) No results for input(s): PROBNP in the last 8760 hours.  HbA1C: No results for input(s): HGBA1C in the last 72 hours.  CBG:  Recent Labs Lab 02/05/16 0645 02/05/16 1051 02/05/16 1602 02/05/16 2121 02/06/16 1129  GLUCAP 122* 122* 151* 108* 90    Lipid Profile: No results for input(s): CHOL, HDL, LDLCALC, TRIG, CHOLHDL, LDLDIRECT in the last 72 hours.  Thyroid Function Tests: No results for input(s): TSH, T4TOTAL, FREET4, T3FREE, THYROIDAB in the last 72 hours.  Anemia Panel: No results for input(s): VITAMINB12,  FOLATE, FERRITIN, TIBC, IRON, RETICCTPCT in the last 72 hours.  Urine analysis:    Component Value Date/Time   COLORURINE YELLOW 02/02/2016 1329   APPEARANCEUR CLEAR 02/02/2016 1329   LABSPEC 1.026 02/02/2016 1329   PHURINE 5.0 02/02/2016 1329   GLUCOSEU NEGATIVE 02/02/2016 1329   HGBUR NEGATIVE 02/02/2016 1329   BILIRUBINUR NEGATIVE 02/02/2016 1329   KETONESUR 15* 02/02/2016 1329   PROTEINUR 30* 02/02/2016 1329   UROBILINOGEN 1.0 01/31/2016 1405   NITRITE NEGATIVE 02/02/2016 1329   LEUKOCYTESUR NEGATIVE 02/02/2016 1329    Sepsis Labs: Lactic Acid, Venous    Component Value Date/Time   LATICACIDVEN 1.17 02/02/2016 1520    MICROBIOLOGY: Recent Results (from the past 240 hour(s))  Culture, blood (Routine x 2)     Status: None (Preliminary result)   Collection Time: 02/02/16 11:45 AM  Result Value Ref Range Status   Specimen Description BLOOD RIGHT FOREARM  Final   Special Requests BOTTLES DRAWN AEROBIC AND ANAEROBIC  5CC  Final   Culture NO GROWTH 3 DAYS  Final   Report Status PENDING  Incomplete  Culture, blood (Routine x 2)     Status: None (Preliminary result)   Collection Time: 02/02/16 11:55 AM  Result Value Ref Range Status   Specimen Description BLOOD RIGHT HAND  Final    Special Requests BOTTLES DRAWN AEROBIC AND ANAEROBIC  5CC  Final   Culture NO GROWTH 3 DAYS  Final   Report Status PENDING  Incomplete  Urine culture     Status: None   Collection Time: 02/02/16  1:29 PM  Result Value Ref Range Status   Specimen Description URINE, CLEAN CATCH  Final   Special Requests NONE  Final   Culture NO GROWTH  Final   Report Status 02/03/2016 FINAL  Final  Surgical pcr screen     Status: Abnormal   Collection Time: 02/05/16  3:35 PM  Result Value Ref Range Status   MRSA, PCR NEGATIVE NEGATIVE Final   Staphylococcus aureus POSITIVE (A) NEGATIVE Final    Comment:        The Xpert SA Assay (FDA approved for NASAL specimens in patients over 99 years of age), is one component of a comprehensive surveillance program.  Test performance has been validated by [facility] for patients greater than or equal to 58 year old. It is not intended to diagnose infection nor to guide or monitor treatment.     RADIOLOGY STUDIES/RESULTS: Dg Chest 2 View  02/02/2016  CLINICAL DATA:  Cough EXAM: CHEST  2 VIEW COMPARISON:  01/31/2016 chest radiograph. FINDINGS: Stable cardiomediastinal silhouette with normal heart size. No pneumothorax. No pleural effusion. Lungs appear clear, with no acute consolidative airspace disease and no pulmonary edema. IMPRESSION: No active cardiopulmonary disease. Electronically Signed   By: Ilona Sorrel M.D.   On: 02/02/2016 11:27   Dg Chest 2 View  01/31/2016  CLINICAL DATA:  Shortness of breath with wheezing and fever EXAM: CHEST  2 VIEW COMPARISON:  Chest radiograph and chest CT August 25, 2015 FINDINGS: There is no apparent edema or consolidation. Heart size and pulmonary vascularity are normal. No adenopathy. There is degenerative change in the thoracic spine. IMPRESSION: No edema or consolidation. Electronically Signed   By: Lowella Grip III M.D.   On: 01/31/2016 14:20   Dg Wrist 2 Views Right  02/05/2016  CLINICAL DATA:  50 year old with 1  day history of pain and swelling involving the right hand and wrist. Current history of gout, hypertension and diabetes.  EXAM: RIGHT WRIST - 2 VIEW COMPARISON:  None. FINDINGS: No evidence of acute or subacute fracture or dislocation. Well preserved joint spaces. Well preserved bone mineral density. No erosive changes to suggest gout. Mild enthesopathy involving a musculotendinous insertion on the distal ulna. Atherosclerosis involving the radial artery. IMPRESSION: 1. No acute or significant osseous abnormality. 2. Atherosclerosis involving the radial artery. Electronically Signed   By: Evangeline Dakin M.D.   On: 02/05/2016 10:18   Dg Hand 2 View Right  02/05/2016  CLINICAL DATA:  50 year old with 1 day history of pain and swelling involving the right hand and wrist. Current history of gout, hypertension and diabetes. EXAM: RIGHT HAND - 2 VIEW COMPARISON:  None. FINDINGS: No evidence of acute or subacute fracture or dislocation. Well preserved joint spaces. Well preserved bone mineral density. No erosions to suggest gout. Exostosis arising from the distal 2nd metacarpal. IMPRESSION: 1. No acute osseous abnormality. 2. Likely benign osteochondroma Sign arising from the distal 2nd metacarpal. Electronically Signed   By: Evangeline Dakin M.D.   On: 02/05/2016 10:19   Mr Foot Left Wo Contrast  02/04/2016  CLINICAL DATA:  Charcot foot and osteomyelitis.  Diabetes. EXAM: MRI OF THE LEFT FOREFOOT WITHOUT CONTRAST TECHNIQUE: Multiplanar, multisequence MR imaging was performed. No intravenous contrast was administered. COMPARISON:  02/02/2016 radiographs FINDINGS: Chronic Charcot arthropathy with resorption of the vast majority of the midfoot and at the bases of the second through fifth metatarsals. Most of the talus is still present , and the calcaneus and a significant part of the proximal cuboid is still visible. Cuneiforms are completely fragmented and unrecognizable. In the expected location of the midfoot there  is abnormal heterogeneous fluid signal intensity which appears to drain to the plantar foot as on image 28 series 12. This raises concern for superimposed infection, as draining sinus tracks are unusual from sterile Charcot arthropathy. The stranding appears to be occurring to the plantar -lateral surface. There is chronic sclerosis in the second through fifth metatarsals. At the midfoot defect, there is varus angulation between the hindfoot and the remaining portions of the metatarsals. Extensive subcutaneous edema in the remaining foot with apparent blistering along the dorsum of the foot in the vicinity of the first MTP joint. The edema extends into the toes. As expected there is diffuse edema along the plantar musculature of the foot. Erosion along the medial malleolus noted for example on image 21/8. IMPRESSION: 1. Midfoot osteolysis with irregular calcifications corresponding to the original cuneiform is a and anterior cuboid, and bases of the second through fifth metatarsals. Much of this is likely from Charcot arthropathy. However, much of the midfoot is replaced by complex fluid which appears to be draining to the plantar and lateral skin surface -draining sinus tracks are much more characteristic of osteomyelitis rather than Charcot arthropathy and [place] the likelihood of superimposed chronic osteoarthritis seems high. 2. Blistering along the medial side of the first MTP joint with extensive edema tracking in the soft tissues of the foot. Erosion along the inferior margin of the medial malleolus and adjacent talus, although generally the talus and calcaneus are mostly intact otherwise. Electronically Signed   By: Van Clines M.D.   On: 02/04/2016 16:31   Dg Foot Complete Left  02/02/2016  CLINICAL DATA:  Ulcers along foot.  Charcot foot. EXAM: LEFT FOOT - COMPLETE 3+ VIEW COMPARISON:  Plain films LEFT foot, 08/17/2015 FINDINGS: Compared to exam of 08/17/2015, there is interval osseous erosion at the  base of the  fifth, fourth, third, and second metatarsals. There is bone loss within the tarsal bones with noticeable bone loss in cuneiforms. There is a robust periosteal reaction along the shafts of the metatarsals with dense dense sclerosis of the metatarsals proximal. There is interval increase in soft tissue swelling throughout the midfoot. Small amount subcutaneous gas along the plantar surface. IMPRESSION: Interval severe osseous erosion involving the proximal metatarsals and tarsal bones along the metatarsal tarsal joints. Interval marked soft tissue swelling. Differential includes aggressive erosive arthropathy versus OSTEOMYELITIS. Favor severe osteomyelitis. Electronically Signed   By: Suzy Bouchard M.D.   On: 02/02/2016 13:34     LOS: 4 days   Oren Binet, MD  [facility] Pager:[phone]  If 7PM-7AM, please contact night-coverage [email or web address] Password [email or web address] 02/06/2016, 1:20 PM

## 2016-02-06 NOTE — Transfer of Care (Signed)
Immediate Anesthesia Transfer of Care Note  Patient: Troy [name]  Procedure(s) Performed: Procedure(s): AMPUTATION BELOW KNEE (Left)  Patient Location: PACU  Anesthesia Type:General  Level of Consciousness: awake, alert , oriented and patient cooperative  Airway & Oxygen Therapy: Patient Spontanous Breathing  Post-op Assessment: Report given to RN and Post -op Vital signs reviewed and stable  Post vital signs: Reviewed and stable  Last Vitals:  Filed Vitals:   02/06/16 0514 02/06/16 1207  BP: 144/80 145/91  Pulse: 81 80  Temp: 37.5 C 37 C  Resp: 18 18    Last Pain:  Filed Vitals:   02/06/16 1208  PainSc: Asleep      Patients Stated Pain Goal: 4 ([id number])  Complications: No apparent anesthesia complications

## 2016-02-06 NOTE — Interval H&P Note (Signed)
History and Physical Interval Note:  02/06/2016 2:05 PM  Troy [name]  has presented today for surgery, with the diagnosis of Left Foot Osteomyelitis  The various methods of treatment have been discussed with the patient and family. After consideration of risks, benefits and other options for treatment, the patient has consented to  Procedure(s): AMPUTATION BELOW KNEE (Left) as a surgical intervention .  The patient's history has been reviewed, patient examined, no change in status, stable for surgery.  I have reviewed the patient's chart and labs.  Questions were answered to the patient's satisfaction.     [name]

## 2016-02-06 NOTE — Progress Notes (Signed)
Report called to [facility] in Short Stay. All questions/concerns addressed. Will continue to monitor

## 2016-02-07 ENCOUNTER — Encounter (HOSPITAL_COMMUNITY): Payer: Self-pay | Admitting: Orthopaedic Surgery

## 2016-02-07 DIAGNOSIS — M86172 Other acute osteomyelitis, left ankle and foot: Secondary | ICD-10-CM | POA: Insufficient documentation

## 2016-02-07 LAB — GLUCOSE, CAPILLARY
GLUCOSE-CAPILLARY: 123 mg/dL — AB (ref 65–99)
GLUCOSE-CAPILLARY: 158 mg/dL — AB (ref 65–99)
GLUCOSE-CAPILLARY: 155 mg/dL — AB (ref 65–99)
Glucose-Capillary: 153 mg/dL — ABNORMAL HIGH (ref 65–99)

## 2016-02-07 LAB — CULTURE, BLOOD (ROUTINE X 2)
CULTURE: NO GROWTH
CULTURE: NO GROWTH

## 2016-02-07 LAB — BASIC METABOLIC PANEL
Anion gap: 9 (ref 5–15)
BUN: 18 mg/dL (ref 6–20)
CALCIUM: 8.5 mg/dL — AB (ref 8.9–10.3)
CO2: 23 mmol/L (ref 22–32)
CREATININE: 1.46 mg/dL — AB (ref 0.61–1.24)
Chloride: 103 mmol/L (ref 101–111)
GFR calc Af Amer: >60 mL/min (ref >60)
GFR, EST NON AFRICAN AMERICAN: 54 mL/min — AB (ref >60)
GLUCOSE: 100 mg/dL — AB (ref 65–99)
Potassium: 4.3 mmol/L (ref 3.5–5.1)
Sodium: 135 mmol/L (ref 135–145)

## 2016-02-07 LAB — VANCOMYCIN, TROUGH: Vancomycin Tr: 16 ug/mL (ref 15–20)

## 2016-02-07 MED ORDER — MUPIROCIN 2 % EX OINT
TOPICAL_OINTMENT | Freq: Two times a day (BID) | CUTANEOUS | Status: DC
  Administered 2016-02-07 – 2016-02-08 (×3): via NASAL
  Administered 2016-02-08: 1 via NASAL
  Administered 2016-02-09: 10:00:00 via NASAL
  Filled 2016-02-07: qty 22

## 2016-02-07 MED ORDER — HEPARIN SODIUM (PORCINE) 5000 UNIT/ML IJ SOLN
5000.0000 [IU] | Freq: Three times a day (TID) | INTRAMUSCULAR | Status: DC
  Administered 2016-02-07 – 2016-02-09 (×7): 5000 [IU] via SUBCUTANEOUS
  Filled 2016-02-07 (×6): qty 1

## 2016-02-07 MED ORDER — PREDNISONE 20 MG PO TABS
20.0000 mg | ORAL_TABLET | Freq: Every day | ORAL | Status: DC
  Administered 2016-02-07 – 2016-02-09 (×3): 20 mg via ORAL
  Filled 2016-02-07 (×2): qty 1

## 2016-02-07 MED ORDER — CHLORHEXIDINE GLUCONATE CLOTH 2 % EX PADS
6.0000 | MEDICATED_PAD | Freq: Every day | CUTANEOUS | Status: DC
  Administered 2016-02-09: 6 via TOPICAL

## 2016-02-07 MED ORDER — PREDNISONE 20 MG PO TABS
20.0000 mg | ORAL_TABLET | Freq: Every day | ORAL | Status: DC
  Filled 2016-02-07: qty 1

## 2016-02-07 NOTE — Op Note (Signed)
Troy [name], Troy [name] NO.:  [id number]  MEDICAL RECORD NO.:  [id number]  LOCATION:  5N01C                        FACILITY:  [facility]  PHYSICIAN:  Quetzally Callas C. [name], M.D.    DATE OF BIRTH:  1966/07/04  DATE OF PROCEDURE:  02/06/2016 DATE OF DISCHARGE:                              OPERATIVE REPORT   PREOPERATIVE DIAGNOSES: 1. Left foot osteomyelitis. 2. Diabetes. 3. Wagner grade 4 ulcers.  POSTOPERATIVE DIAGNOSES: 1. Left foot osteomyelitis. 2. Diabetes. 3. Wagner grade 4 ulcers.  PROCEDURE:  Left below-knee amputation.  SURGEON:  Ardine Iacovelli C. [name], MD.  ASSISTANT:  Alyson Locket. Ricard Dillon, PA-C, medically necessary and present for the procedure.  ANESTHESIA:  General.  ESTIMATED BLOOD LOSS:  Minimal.  TOURNIQUET:  Less than 30 minutes.  DESCRIPTION OF PROCEDURE:  After standard prepping and draping, proximal thigh tourniquet, the foot had been isolated with sterile Betadine, Steri-Drape.  Prepping from the ankle to the tourniquet, impervious stockinette, Coban, and extremity sheets and drapes were applied.  Time- out procedure completed.  Ancef was given prophylactically.  Incision was made midway between the ankle and the knee with a transverse incision and a posterior flap.  Tibia was divided with oscillating saw. Anterior edge was beveled.  Fibula was cut 2 cm proximal with the oscillating saw.  Arches were cut and suture ligated with #2 suture. Veins were coagulated unless they were larger.  Nerves were cut on stretch including the sural anterior tib, posterior tib nerves.  The amputation was completed with the amputation knife behind the tibia and fibula, exiting at the end of the posterior flap.  The posterior muscle compartment was thinned and then gastrocsoleus flap was pulled anteriorly and tendon was sutured over the end of the bone to the anterior periosteum.  The fascia was closed on each side, subcutaneous tissue with 2-0 Vicryl, skin staple closure,  postop dressing, and knee immobilizer.  Instrument count and needle count was correct.     [place] C. [name], M.D.     MCY/MEDQ  D:  02/06/2016  T:  02/07/2016  Job:  [id number]

## 2016-02-07 NOTE — Care Management Note (Signed)
Case Management Note  Patient Details  Name: Troy [name] MRN: [id number] Date of Birth: 07-21-1966  Subjective/Objective:    50 yr old gentleman admitted with osteomyelitis of the left foot, underwent a left BKA.                Action/Plan: Case manager spoke with patient concerning home health and DME needs. Choice offered for home health agency. Referral was called to Marcy Salvo, [facility].   Patient states he will have family support at discharge.   Expected Discharge Date:   02/08/16               Expected Discharge Plan:  [place]  In-House Referral:     Discharge planning Services  CM Consult  Post Acute Care Choice:  Durable Medical Equipment Choice offered to:  Patient  DME Arranged:  Walker rolling, Wheelchair manual DME Agency:  [place]:  [facility] [place] Agency:  [facility]  Status of Service:  Completed, signed off  If discussed at [place] of Stay Meetings, dates discussed:    Additional Comments:  Ninfa Meeker, RN 02/07/2016, 2:33 PM

## 2016-02-07 NOTE — Progress Notes (Addendum)
Occupational Therapy Evaluation Patient Details Name: Troy [name] MRN: [id number] DOB: 1965/12/01 Today's Date: 02/07/2016    History of Present Illness 50 y.o. male with a past medical history of diabetes, Charcot foot admitted with worsening swelling of his left foot, further evaluation revealed osteomyelitis. Pt now s/p BKA on 02/06/16. PMH: hypertension, diabetes, goutasthma, obesity, CKD, polyneuropathy   Clinical Impression   Pt admitted with the above diagnoses and presents with below problem list. Pt will benefit from continued acute OT to address the below listed deficits and maximize independence with BADLs prior to d/c to venue below. PTA pt was independent with ADLs. Pt is currently +2 mod for LB ADLs and functional transfers. Pt reports he will have assistance at home. Would like to see pt mobilizing better prior to d/c home.      Follow Up Recommendations  Home health OT;Supervision/Assistance - 24 hour    Equipment Recommendations  3 in 1 bedside comode;Tub/shower bench    Recommendations for Other Services       Precautions / Restrictions Precautions Precautions: Fall Required Braces or Orthoses: Knee Immobilizer - Left Knee Immobilizer - Left: On when out of bed or walking Restrictions Weight Bearing Restrictions: Yes LLE Weight Bearing: Non weight bearing      Mobility Bed Mobility Overal bed mobility: Needs Assistance Bed Mobility: Supine to Sit     Supine to sit: Min assist;HOB elevated     General bed mobility comments: assist provided at trunk to get fully to sitting EOB.   Transfers Overall transfer level: Needs assistance Equipment used: Rolling walker (2 wheeled) Transfers: Sit to/from [facility] Sit to Stand: +2 physical assistance;Mod assist Stand pivot transfers: +2 physical assistance;Mod assist       General transfer comment: Performed stand pivot with rw, difficulty holding rw with Rt hand due to recent gout.  Repeated sit/stand from chair X2 for donning/doffing boxers.     Balance Overall balance assessment: Needs assistance Sitting-balance support: No upper extremity supported Sitting balance-Leahy Scale: Good     Standing balance support: Bilateral upper extremity supported;During functional activity Standing balance-Leahy Scale: Poor Standing balance comment: rw and assist for dynamic standing task                            ADL Overall ADL's : Needs assistance/impaired Eating/Feeding: Set up;Sitting   Grooming: Set up;Sitting   Upper Body Bathing: Set up;Sitting   Lower Body Bathing: Moderate assistance;+2 for physical assistance;Sit to/from stand   Upper Body Dressing : Set up;Sitting   Lower Body Dressing: Moderate assistance;+2 for physical assistance;Sit to/from stand   Toilet Transfer: Moderate assistance;+2 for physical assistance;Stand-pivot;BSC;RW   Toileting- Clothing Manipulation and Hygiene: Moderate assistance;+2 for physical assistance;Sit to/from stand   Tub/ Banker: Moderate assistance;+2 for physical assistance;Stand-pivot;3 in 1;Rolling walker   Functional mobility during ADLs: Moderate assistance;+2 for physical assistance;Rolling walker (from EOB to recliner,) General ADL Comments: Pt completed functional mobility from EOB to recliner, LB dressing in sit/stand position as detailed above. Discussed AE and techniques for LB dressing.      Vision     Perception     Praxis      Pertinent Vitals/Pain Pain Assessment: Faces Faces Pain Scale: Hurts even more Pain Location: LLE Pain Descriptors / Indicators: Grimacing;Guarding Pain Intervention(s): Limited activity within patient's tolerance;Monitored during session     Hand Dominance     Extremity/Trunk Assessment Upper Extremity Assessment Upper Extremity Assessment: RUE deficits/detail  RUE Deficits / Details: gout in R hand impacting grip strength and ability to bear much  weight into RUE with rw. Pt able to hold onto rw with R hand, it just was not able to support as much weight as left hand during transfers/ambulation.  RUE Coordination: decreased fine motor   Lower Extremity Assessment Lower Extremity Assessment: Defer to PT evaluation RLE Deficits / Details: WFL LLE Deficits / Details: able to raise independently       Communication Communication Communication: No difficulties   Cognition Arousal/Alertness: Awake/alert Behavior During Therapy: WFL for tasks assessed/performed Overall Cognitive Status: Within Functional Limits for tasks assessed                     General Comments       Exercises       Shoulder Instructions      Home Living Family/patient expects to be discharged to:: Private residence Living Arrangements: Spouse/significant other Available Help at Discharge: Family;Available 24 hours/day Type of Home: House Home Access: Stairs to enter;Ramped entrance     Home Layout: One level     Bathroom Shower/Tub: Tub/shower unit Shower/tub characteristics: Architectural technologist: Standard Bathroom Accessibility: Yes   Home Equipment: None   Additional Comments: reports that a ramp is being built, works in [employer]      Prior Functioning/Environment Level of Independence: Independent             OT Diagnosis: Acute pain   OT Problem List: Decreased activity tolerance;Impaired balance (sitting and/or standing);Decreased knowledge of use of DME or AE;Decreased knowledge of precautions;Pain   OT Treatment/Interventions: Self-care/ADL training;DME and/or AE instruction;Therapeutic activities;Patient/family education;Balance training    OT Goals(Current goals can be found in the care plan section) Acute Rehab OT Goals Patient Stated Goal: be independent again OT Goal Formulation: With patient Time For Goal Achievement: 02/14/16 Potential to Achieve Goals: Good ADL Goals Pt Will Perform Lower Body  Bathing: with mod assist;sit to/from stand;sitting/lateral leans;with adaptive equipment Pt Will Perform Lower Body Dressing: with mod assist;sitting/lateral leans;sit to/from stand;with adaptive equipment Pt Will Transfer to Toilet: with mod assist;stand pivot transfer;bedside commode Pt Will Perform Toileting - Clothing Manipulation and hygiene: with mod assist;sitting/lateral leans;sit to/from stand Pt Will Perform Tub/Shower Transfer: Tub transfer;tub bench;with mod assist;Stand pivot transfer;rolling walker  OT Frequency: Min 2X/week   Barriers to D/C:            Co-evaluation PT/OT/SLP Co-Evaluation/Treatment: Yes Reason for Co-Treatment: For patient/therapist safety PT goals addressed during session: Mobility/safety with mobility OT goals addressed during session: ADL's and self-care      End of Session Equipment Utilized During Treatment: Gait belt;Rolling walker;Left knee immobilizer  Activity Tolerance: Patient tolerated treatment well Patient left: in chair;with call bell/phone within reach   Time: 1000-1035 OT Time Calculation (min): 35 min Charges:  OT General Charges $OT Visit: 1 Procedure OT Evaluation $OT Eval Moderate Complexity: 1 Procedure G-Codes:    Hortencia Pilar February 08, 2016, 2:12 PM

## 2016-02-07 NOTE — Progress Notes (Signed)
Pharmacy Antibiotic Note  Troy [name] is a 50 y.o. male admitted on 02/02/2016 with osteomyelitis 2/2 diabetic foot ulcer as confirmed on 7/10 with MRI. Pt is now s/p left BKA on 7/12. Pharmacy has been consulted for vancomycin and Zosyn dosing. Pt remains afebrile (Tmax/24hr 99.3) with WBC trending down from 16.8 to 12.7. SCr peaked at 1.69 (baseline ~1.2), but is now back down to 1.46.   Vancomycin trough today was therapeutic at 16 on 1500 mg q24h. Orthopedics is recommending 48 more hours of IV antibiotics, then potentially discharge home.  Plan: --Continue vancomycin 1500 mg IV every 24 hours through 7/15 per ortho. Goal trough 15-20 mcg/mL --Continue Zosyn 3.375 g IV q8h (4 hour infusion) through 7/15 per ortho. --Continue to monitor clinical status, renal fxn, VT PRN if therapy to continue past 48 hrs --F/U plans for discharge  Height: 5\' 10"  (177.8 cm) Weight: 285 lb (129.275 kg) IBW/kg (Calculated) : 73  Temp (24hrs), Avg:98.5 F (36.9 C), Min:97.8 F (36.6 C), Max:99.3 F (37.4 C)   Recent Labs Lab 02/02/16 1218 02/02/16 1222 02/02/16 1509 02/02/16 1520 02/03/16 0536 02/04/16 0211 02/06/16 0224 02/07/16 0538 02/07/16 1315  WBC 16.8*  --   --   --  13.0* 11.8* 12.7*  --   --   CREATININE 1.57*  --  1.30*  --  1.69* 1.64* 1.68* 1.46*  --   LATICACIDVEN  --  1.61  --  1.17  --   --   --   --   --   VANCOTROUGH  --   --   --   --   --   --  20  --  16    Estimated Creatinine Clearance: 79.9 mL/min (by C-G formula based on Cr of 1.46).   Normalized CrCl = ~50-60 mL/min  Allergies  Allergen Reactions  . No Known Allergies     Antimicrobials this admission: Vanc 7/8 >>  Zosyn 7/8 >>   Dose adjustments/levels this admission: 7/13 at 1315: VT = 16 on 1500 mg q24h   Microbiology results: 7/8 UCx >> NGF 7/8 BCx>> NGx4D 7/11 MRSA PCR positive  Thank you for allowing pharmacy to be a part of this patient's care.  Governor Specking, PharmD Clinical  Pharmacist Phone: [phone] Pager: [phone]  02/07/2016 2:28 PM

## 2016-02-07 NOTE — Progress Notes (Signed)
PROGRESS NOTE        PATIENT DETAILS Name: Troy [name] Age: 50 y.o. Sex: male Date of Birth: [birth year] Admit Date: 02/02/2016 Admitting Physician Geradine Girt, DO WE:[id number] D, MD  Brief Narrative: Patient is a 50 y.o. male with a past medical history of diabetes, Charcot foot admitted with worsening swelling of his left foot, further evaluation revealed osteomyelitis. Seen by orthopedics, scheduled for left BKA  7/12.  Subjective:  Patient states that the pain in his left lower extremities controlled, afebrile overnight  Assessment/Plan:   Osteomyelitis of foot, acute, left: Continue empiric Zosyn/vancomycin, blood cultures negative so far. Seen by orthopedics, plans are for left BKA 7/12. Patient has not been seen by PT/OT. Orthopedics recommended to continue IV antibiotics for another 48 hours, then discharge. Follow cultures from the wound prior to discharge    Acute gouty arthritis right hand: No response to colchicine, received 80 mg of IV Solu-Medrol yesterday, now on prednisone 20 mg a day  Insulin-dependent type 2 diabetes: CBGs stable, continue 10 units of Lantus and SSI.  Hypertension: Blood pressure currently stable without the use of any antihypertensives, follow for now.  Chronic kidney disease stage III: Creatinine still close to usual baseline, monitor periodically.  Anemia: Likely has anemia related to acute illness and underlying chronic kidney disease. Follow hemoglobin.  Peripheral neuropathy: Secondary to diabetes, will need outpatient follow-up with PCP and primary podiatrist.  History of bronchial asthma: Lungs clear, continue Breo and prn bronchodilators.  DVT Prophylaxis: Prophylactic Heparin   Code Status: Full code   Family Communication: None at bedside-patient awake and alert-understanding of the above noted plan  Disposition Plan: Remain inpatient-but will plan on Home health vs SNF on discharge-await PT  eval post op to determine disposition Anticipate discharge in 2-3 days  Antimicrobial agents: Intravenous vancomycin 7/8>> Intravenous Zosyn 7/8>>  Procedures: None  CONSULTS:  orthopedic surgery  Time spent: 25 minutes-Greater than 50% of this time was spent in counseling, explanation of diagnosis, planning of further management, and coordination of care.  MEDICATIONS: Anti-infectives    Start     Dose/Rate Route Frequency Ordered Stop   02/03/16 1300  vancomycin (VANCOCIN) 1,500 mg in sodium chloride 0.9 % 500 mL IVPB     1,500 mg 250 mL/hr over 120 Minutes Intravenous Every 24 hours 02/02/16 1310     02/02/16 2000  piperacillin-tazobactam (ZOSYN) IVPB 3.375 g     3.375 g 12.5 mL/hr over 240 Minutes Intravenous Every 8 hours 02/02/16 1459     02/02/16 1930  piperacillin-tazobactam (ZOSYN) IVPB 3.375 g  Status:  Discontinued     3.375 g 12.5 mL/hr over 240 Minutes Intravenous Every 8 hours 02/02/16 1453 02/02/16 1459   02/02/16 1330  vancomycin (VANCOCIN) 2,500 mg in sodium chloride 0.9 % 500 mL IVPB     2,500 mg 250 mL/hr over 120 Minutes Intravenous  Once 02/02/16 1310 02/02/16 1810   02/02/16 1300  piperacillin-tazobactam (ZOSYN) IVPB 3.375 g     3.375 g 100 mL/hr over 30 Minutes Intravenous  Once 02/02/16 1256 02/02/16 1412      Scheduled Meds: . aspirin EC  81 mg Oral Daily  . Chlorhexidine Gluconate Cloth  6 each Topical Q0600  . docusate sodium  100 mg Oral BID  . fluticasone furoate-vilanterol  1 puff Inhalation Daily  . insulin aspart  0-15 Units  Subcutaneous TID WC  . insulin aspart  0-5 Units Subcutaneous QHS  . insulin glargine  10 Units Subcutaneous QHS  . methylPREDNISolone (SOLU-MEDROL) injection  80 mg Intravenous Once  . piperacillin-tazobactam (ZOSYN)  IV  3.375 g Intravenous Q8H  . vancomycin  1,500 mg Intravenous Q24H   Continuous Infusions: . sodium chloride Stopped (02/06/16 1523)   PRN Meds:.acetaminophen **OR** acetaminophen, albuterol,  bisacodyl, dextromethorphan-guaiFENesin, hydrALAZINE, HYDROmorphone (DILAUDID) injection, methocarbamol **OR** methocarbamol (ROBAXIN)  IV, metoCLOPramide **OR** metoCLOPramide (REGLAN) injection, ondansetron **OR** ondansetron (ZOFRAN) IV, oxyCODONE, polyethylene glycol, sodium chloride   PHYSICAL EXAM: Vital signs: Filed Vitals:   02/06/16 2035 02/06/16 2359 02/07/16 0501 02/07/16 0836  BP: 159/87 159/84 163/94   Pulse: 94 90 87   Temp: 98.9 F (37.2 C) 99.3 F (37.4 C) 99.1 F (37.3 C)   TempSrc: Oral Oral    Resp: 16 16 17    Height:      Weight:      SpO2: 98% 97% 97% 96%   Filed Weights   02/02/16 1057  Weight: 129.275 kg (285 lb)   Body mass index is 40.89 kg/(m^2).   Gen Exam: Awake and alert with clear speech. Not in any distress  Neck: Supple, No JVD.   Chest: B/L Clear.   CVS: S1 S2 Regular, no murmurs.  Abdomen: soft, BS +, non tender, non distended.  Extremities: Left lower extremity-N dressing-did not open. Some mild swelling in the right dorsal hand near the proximal thumb and index finger Neurologic: Non Focal.   Skin: No Rash or lesions   Wounds: N/A.   LABORATORY DATA: CBC:  Recent Labs Lab 01/31/16 1408 02/02/16 1218 02/03/16 0536 02/04/16 0211 02/06/16 0224  WBC  --  16.8* 13.0* 11.8* 12.7*  NEUTROABS  --  11.6*  --  7.2  --   HGB 12.9* 10.7* 10.5* 9.9* 9.6*  HCT 38.0* 32.1* 32.1* 29.5* 29.1*  MCV  --  84.3 85.1 82.9 84.3  PLT  --  256 274 272 [id number]    Basic Metabolic Panel:  Recent Labs Lab 02/02/16 1218 02/02/16 1509 02/03/16 0536 02/04/16 0211 02/06/16 0224 02/07/16 0538  NA 134*  --  137 137 136 135  K 4.3  --  4.3 4.2 4.1 4.3  CL 105  --  106 107 105 103  CO2 21*  --  21* 22 23 23   GLUCOSE 153*  --  116* 105* 108* 100*  BUN 21*  --  24* 25* 22* 18  CREATININE 1.57* 1.30* 1.69* 1.64* 1.68* 1.46*  CALCIUM 9.0  --  8.9 8.6* 8.4* 8.5*    GFR: Estimated Creatinine Clearance: 79.9 mL/min (by C-G formula based on Cr of  1.46).  Liver Function Tests:  Recent Labs Lab 02/02/16 1218 02/04/16 0211 02/06/16 0224  AST 28 36 29  ALT 32 47 39  ALKPHOS 88 69 67  BILITOT 0.6 0.7 0.6  PROT 7.8 7.5 7.1  ALBUMIN 2.8* 2.4* 2.2*   No results for input(s): LIPASE, AMYLASE in the last 168 hours. No results for input(s): AMMONIA in the last 168 hours.  Coagulation Profile: No results for input(s): INR, PROTIME in the last 168 hours.  Cardiac Enzymes: No results for input(s): CKTOTAL, CKMB, CKMBINDEX, TROPONINI in the last 168 hours.  BNP (last 3 results) No results for input(s): PROBNP in the last 8760 hours.  HbA1C: No results for input(s): HGBA1C in the last 72 hours.  CBG:  Recent Labs Lab 02/06/16 1129 02/06/16 1348 02/06/16 1620 02/06/16 2119 02/07/16  0622  GLUCAP 90 86 87 117* 123*    Lipid Profile: No results for input(s): CHOL, HDL, LDLCALC, TRIG, CHOLHDL, LDLDIRECT in the last 72 hours.  Thyroid Function Tests: No results for input(s): TSH, T4TOTAL, FREET4, T3FREE, THYROIDAB in the last 72 hours.  Anemia Panel: No results for input(s): VITAMINB12, FOLATE, FERRITIN, TIBC, IRON, RETICCTPCT in the last 72 hours.  Urine analysis:    Component Value Date/Time   COLORURINE YELLOW 02/02/2016 1329   APPEARANCEUR CLEAR 02/02/2016 1329   LABSPEC 1.026 02/02/2016 1329   PHURINE 5.0 02/02/2016 1329   GLUCOSEU NEGATIVE 02/02/2016 1329   HGBUR NEGATIVE 02/02/2016 1329   BILIRUBINUR NEGATIVE 02/02/2016 1329   KETONESUR 15* 02/02/2016 1329   PROTEINUR 30* 02/02/2016 1329   UROBILINOGEN 1.0 01/31/2016 1405   NITRITE NEGATIVE 02/02/2016 1329   LEUKOCYTESUR NEGATIVE 02/02/2016 1329    Sepsis Labs: Lactic Acid, Venous    Component Value Date/Time   LATICACIDVEN 1.17 02/02/2016 1520    MICROBIOLOGY: Recent Results (from the past 240 hour(s))  Culture, blood (Routine x 2)     Status: None (Preliminary result)   Collection Time: 02/02/16 11:45 AM  Result Value Ref Range Status    Specimen Description BLOOD RIGHT FOREARM  Final   Special Requests BOTTLES DRAWN AEROBIC AND ANAEROBIC  5CC  Final   Culture NO GROWTH 4 DAYS  Final   Report Status PENDING  Incomplete  Culture, blood (Routine x 2)     Status: None (Preliminary result)   Collection Time: 02/02/16 11:55 AM  Result Value Ref Range Status   Specimen Description BLOOD RIGHT HAND  Final   Special Requests BOTTLES DRAWN AEROBIC AND ANAEROBIC  5CC  Final   Culture NO GROWTH 4 DAYS  Final   Report Status PENDING  Incomplete  Urine culture     Status: None   Collection Time: 02/02/16  1:29 PM  Result Value Ref Range Status   Specimen Description URINE, CLEAN CATCH  Final   Special Requests NONE  Final   Culture NO GROWTH  Final   Report Status 02/03/2016 FINAL  Final  Surgical pcr screen     Status: Abnormal   Collection Time: 02/05/16  3:35 PM  Result Value Ref Range Status   MRSA, PCR NEGATIVE NEGATIVE Final   Staphylococcus aureus POSITIVE (A) NEGATIVE Final    Comment:        The Xpert SA Assay (FDA approved for NASAL specimens in patients over 61 years of age), is one component of a comprehensive surveillance program.  Test performance has been validated by [facility] for patients greater than or equal to 30 year old. It is not intended to diagnose infection nor to guide or monitor treatment.     RADIOLOGY STUDIES/RESULTS: Dg Chest 2 View  02/02/2016  CLINICAL DATA:  Cough EXAM: CHEST  2 VIEW COMPARISON:  01/31/2016 chest radiograph. FINDINGS: Stable cardiomediastinal silhouette with normal heart size. No pneumothorax. No pleural effusion. Lungs appear clear, with no acute consolidative airspace disease and no pulmonary edema. IMPRESSION: No active cardiopulmonary disease. Electronically Signed   By: Ilona Sorrel M.D.   On: 02/02/2016 11:27   Dg Chest 2 View  01/31/2016  CLINICAL DATA:  Shortness of breath with wheezing and fever EXAM: CHEST  2 VIEW COMPARISON:  Chest radiograph and chest CT  August 25, 2015 FINDINGS: There is no apparent edema or consolidation. Heart size and pulmonary vascularity are normal. No adenopathy. There is degenerative change in the thoracic spine. IMPRESSION:  No edema or consolidation. Electronically Signed   By: Lowella Grip III M.D.   On: 01/31/2016 14:20   Dg Wrist 2 Views Right  02/05/2016  CLINICAL DATA:  50 year old with 1 day history of pain and swelling involving the right hand and wrist. Current history of gout, hypertension and diabetes. EXAM: RIGHT WRIST - 2 VIEW COMPARISON:  None. FINDINGS: No evidence of acute or subacute fracture or dislocation. Well preserved joint spaces. Well preserved bone mineral density. No erosive changes to suggest gout. Mild enthesopathy involving a musculotendinous insertion on the distal ulna. Atherosclerosis involving the radial artery. IMPRESSION: 1. No acute or significant osseous abnormality. 2. Atherosclerosis involving the radial artery. Electronically Signed   By: Evangeline Dakin M.D.   On: 02/05/2016 10:18   Dg Hand 2 View Right  02/05/2016  CLINICAL DATA:  50 year old with 1 day history of pain and swelling involving the right hand and wrist. Current history of gout, hypertension and diabetes. EXAM: RIGHT HAND - 2 VIEW COMPARISON:  None. FINDINGS: No evidence of acute or subacute fracture or dislocation. Well preserved joint spaces. Well preserved bone mineral density. No erosions to suggest gout. Exostosis arising from the distal 2nd metacarpal. IMPRESSION: 1. No acute osseous abnormality. 2. Likely benign osteochondroma Sign arising from the distal 2nd metacarpal. Electronically Signed   By: Evangeline Dakin M.D.   On: 02/05/2016 10:19   Mr Foot Left Wo Contrast  02/04/2016  CLINICAL DATA:  Charcot foot and osteomyelitis.  Diabetes. EXAM: MRI OF THE LEFT FOREFOOT WITHOUT CONTRAST TECHNIQUE: Multiplanar, multisequence MR imaging was performed. No intravenous contrast was administered. COMPARISON:  02/02/2016  radiographs FINDINGS: Chronic Charcot arthropathy with resorption of the vast majority of the midfoot and at the bases of the second through fifth metatarsals. Most of the talus is still present , and the calcaneus and a significant part of the proximal cuboid is still visible. Cuneiforms are completely fragmented and unrecognizable. In the expected location of the midfoot there is abnormal heterogeneous fluid signal intensity which appears to drain to the plantar foot as on image 28 series 12. This raises concern for superimposed infection, as draining sinus tracks are unusual from sterile Charcot arthropathy. The stranding appears to be occurring to the plantar -lateral surface. There is chronic sclerosis in the second through fifth metatarsals. At the midfoot defect, there is varus angulation between the hindfoot and the remaining portions of the metatarsals. Extensive subcutaneous edema in the remaining foot with apparent blistering along the dorsum of the foot in the vicinity of the first MTP joint. The edema extends into the toes. As expected there is diffuse edema along the plantar musculature of the foot. Erosion along the medial malleolus noted for example on image 21/8. IMPRESSION: 1. Midfoot osteolysis with irregular calcifications corresponding to the original cuneiform is a and anterior cuboid, and bases of the second through fifth metatarsals. Much of this is likely from Charcot arthropathy. However, much of the midfoot is replaced by complex fluid which appears to be draining to the plantar and lateral skin surface -draining sinus tracks are much more characteristic of osteomyelitis rather than Charcot arthropathy and [place] the likelihood of superimposed chronic osteoarthritis seems high. 2. Blistering along the medial side of the first MTP joint with extensive edema tracking in the soft tissues of the foot. Erosion along the inferior margin of the medial malleolus and adjacent talus, although generally  the talus and calcaneus are mostly intact otherwise. Electronically Signed   By: Van Clines  M.D.   On: 02/04/2016 16:31   Dg Foot Complete Left  02/02/2016  CLINICAL DATA:  Ulcers along foot.  Charcot foot. EXAM: LEFT FOOT - COMPLETE 3+ VIEW COMPARISON:  Plain films LEFT foot, 08/17/2015 FINDINGS: Compared to exam of 08/17/2015, there is interval osseous erosion at the base of the fifth, fourth, third, and second metatarsals. There is bone loss within the tarsal bones with noticeable bone loss in cuneiforms. There is a robust periosteal reaction along the shafts of the metatarsals with dense dense sclerosis of the metatarsals proximal. There is interval increase in soft tissue swelling throughout the midfoot. Small amount subcutaneous gas along the plantar surface. IMPRESSION: Interval severe osseous erosion involving the proximal metatarsals and tarsal bones along the metatarsal tarsal joints. Interval marked soft tissue swelling. Differential includes aggressive erosive arthropathy versus OSTEOMYELITIS. Favor severe osteomyelitis. Electronically Signed   By: Suzy Bouchard M.D.   On: 02/02/2016 13:34     LOS: 5 days   Reyne Dumas, MD  [facility] Pager:[phone]  If 7PM-7AM, please contact night-coverage [email or web address] Password [email or web address] 02/07/2016, 8:57 AM

## 2016-02-07 NOTE — Progress Notes (Signed)
Orthopedic Tech Progress Note Patient Details:  Troy [name] 09-25-1965 [id number]  Ortho Devices Type of Ortho Device: Knee Immobilizer Ortho Device/Splint Location: lle Ortho Device/Splint Interventions: Application   Troy [name] 02/07/2016, 8:14 AM

## 2016-02-07 NOTE — Progress Notes (Signed)
Subjective: 1 Day Post-Op Procedure(s) (LRB): AMPUTATION BELOW KNEE (Left) Patient reports pain as moderate.    Objective: Vital signs in last 24 hours: Temp:  [97.8 F (36.6 C)-99.3 F (37.4 C)] 99.1 F (37.3 C) (07/13 0501) Pulse Rate:  [80-95] 87 (07/13 0501) Resp:  [12-18] 17 (07/13 0501) BP: (145-178)/(84-105) 163/94 mmHg (07/13 0501) SpO2:  [94 %-99 %] 97 % (07/13 0501)  Intake/Output from previous day: 07/12 0701 - 07/13 0700 In: 1000 [I.V.:1000] Out: 2150 [Urine:2100; Blood:50] Intake/Output this shift:     Recent Labs  02/06/16 0224  HGB 9.6*    Recent Labs  02/06/16 0224  WBC 12.7*  RBC 3.45*  HCT 29.1*  PLT 287    Recent Labs  02/06/16 0224 02/07/16 0538  NA 136 135  K 4.1 4.3  CL 105 103  CO2 23 23  BUN 22* 18  CREATININE 1.68* 1.46*  GLUCOSE 108* 100*  CALCIUM 8.4* 8.5*   No results for input(s): LABPT, INR in the last 72 hours.  dressing dry.   Assessment/Plan: 1 Day Post-Op Procedure(s) (LRB): AMPUTATION BELOW KNEE (Left) Up with therapy  . 48 hrs IV ABX then Ok to discharge. Office one week . Leave dressing on.  YATES,MARK C 02/07/2016, 7:10 AM

## 2016-02-07 NOTE — Evaluation (Signed)
Physical Therapy Evaluation Patient Details Name: Jamaire Declerck MRN: [id number] DOB: 22-May-1966 Today's Date: 02/07/2016   History of Present Illness  50 y.o. male with a past medical history of diabetes, Charcot foot admitted with worsening swelling of his left foot, further evaluation revealed osteomyelitis. Pt now s/p BKA on 02/06/16. PMH: hypertension, diabetes, goutasthma, obesity, CKD, polyneuropathy  Clinical Impression  Patient is s/p above surgery resulting in functional limitations due to the deficits listed below (see PT Problem List). Patient will benefit from skilled PT to increase their independence and safety with mobility to allow discharge to home with family support. Currently pt requiring +2 assist for transfers.       Follow Up Recommendations Home health PT;Supervision for mobility/OOB    Equipment Recommendations  Rolling walker with 5" wheels;Wheelchair;Wheelchair cushion    Recommendations for Other Services       Precautions / Restrictions Precautions Precautions: Fall Required Braces or Orthoses: Knee Immobilizer - Left Knee Immobilizer - Left: On when out of bed or walking Restrictions Weight Bearing Restrictions: Yes LLE Weight Bearing: Non weight bearing      Mobility  Bed Mobility Overal bed mobility: Needs Assistance Bed Mobility: Supine to Sit     Supine to sit: Min assist;HOB elevated     General bed mobility comments: assist provided at trunk to get fully to sitting EOB.   Transfers Overall transfer level: Needs assistance Equipment used: Rolling walker (2 wheeled) Transfers: Sit to/from [facility] Sit to Stand: +2 physical assistance;Mod assist Stand pivot transfers: +2 physical assistance;Mod assist       General transfer comment: Performed stand pivot with rw, difficulty holding rw with Rt hand due to recent gout. Repeated sit/stand from chair X2 for donning/doffing boxers.   Ambulation/Gait                Stairs            Wheelchair Mobility    Modified Rankin (Stroke Patients Only)       Balance Overall balance assessment: Needs assistance Sitting-balance support: No upper extremity supported Sitting balance-Leahy Scale: Good     Standing balance support: Bilateral upper extremity supported Standing balance-Leahy Scale: Poor Standing balance comment: using rw for support                             Pertinent Vitals/Pain Pain Assessment: Faces Faces Pain Scale: Hurts even more Pain Location: Lt LE Pain Descriptors / Indicators: Grimacing;Guarding Pain Intervention(s): Limited activity within patient's tolerance;Monitored during session    Home Living Family/patient expects to be discharged to:: Private residence Living Arrangements: Spouse/significant other Available Help at Discharge: Family;Available 24 hours/day Type of Home: House Home Access: Stairs to enter;Ramped entrance     Home Layout: One level Home Equipment: None Additional Comments: reports that a ramp is being built, works in Administrator, arts Level of Independence: Independent               Journalist, newspaper        Extremity/Trunk Assessment   Upper Extremity Assessment: Defer to OT evaluation           Lower Extremity Assessment: RLE deficits/detail;LLE deficits/detail RLE Deficits / Details: WFL LLE Deficits / Details: able to raise independently     Communication   Communication: No difficulties  Cognition Arousal/Alertness: Awake/alert Behavior During Therapy: WFL for tasks assessed/performed Overall Cognitive Status: Within Functional Limits for tasks assessed  General Comments General comments (skin integrity, edema, etc.): reinforced need for knee extension and elevation.     Exercises        Assessment/Plan    PT Assessment Patient needs continued PT services  PT Diagnosis Difficulty walking   PT  Problem List Decreased strength;Decreased range of motion;Decreased activity tolerance;Decreased balance;Decreased mobility  PT Treatment Interventions DME instruction;Gait training;Stair training;Functional mobility training;Therapeutic activities;Therapeutic exercise;Balance training;Patient/family education   PT Goals (Current goals can be found in the Care Plan section) Acute Rehab PT Goals Patient Stated Goal: be independent again PT Goal Formulation: With patient Time For Goal Achievement: 02/21/16 Potential to Achieve Goals: Good    Frequency Min 5X/week   Barriers to discharge        Co-evaluation PT/OT/SLP Co-Evaluation/Treatment: Yes Reason for Co-Treatment: For patient/therapist safety PT goals addressed during session: Mobility/safety with mobility         End of Session Equipment Utilized During Treatment: Gait belt;Left knee immobilizer Activity Tolerance: Patient tolerated treatment well Patient left: in chair;with call bell/phone within reach (LLE elevated) Nurse Communication: Mobility status         Time: 1000-1039 PT Time Calculation (min) (ACUTE ONLY): 39 min   Charges:   PT Evaluation $PT Eval Moderate Complexity: 1 Procedure     PT G Codes:        Cassell Clement, PT, CSCS Pager [phone] Office [phone]  02/07/2016, 12:44 PM

## 2016-02-08 DIAGNOSIS — D500 Iron deficiency anemia secondary to blood loss (chronic): Secondary | ICD-10-CM

## 2016-02-08 DIAGNOSIS — I10 Essential (primary) hypertension: Secondary | ICD-10-CM

## 2016-02-08 DIAGNOSIS — E118 Type 2 diabetes mellitus with unspecified complications: Secondary | ICD-10-CM

## 2016-02-08 DIAGNOSIS — M86172 Other acute osteomyelitis, left ankle and foot: Secondary | ICD-10-CM

## 2016-02-08 LAB — CBC
HEMATOCRIT: 30.1 % — AB (ref 39.0–52.0)
HEMOGLOBIN: 9.9 g/dL — AB (ref 13.0–17.0)
MCH: 27.6 pg (ref 26.0–34.0)
MCHC: 32.9 g/dL (ref 30.0–36.0)
MCV: 83.8 fL (ref 78.0–100.0)
Platelets: 330 10*3/uL (ref 150–400)
RBC: 3.59 MIL/uL — AB (ref 4.22–5.81)
RDW: 15.2 % (ref 11.5–15.5)
WBC: 12.8 10*3/uL — ABNORMAL HIGH (ref 4.0–10.5)

## 2016-02-08 LAB — COMPREHENSIVE METABOLIC PANEL
ALK PHOS: 67 U/L (ref 38–126)
ALT: 39 U/L (ref 17–63)
AST: 32 U/L (ref 15–41)
Albumin: 2.3 g/dL — ABNORMAL LOW (ref 3.5–5.0)
Anion gap: 12 (ref 5–15)
BILIRUBIN TOTAL: 0.4 mg/dL (ref 0.3–1.2)
BUN: 16 mg/dL (ref 6–20)
CALCIUM: 8.7 mg/dL — AB (ref 8.9–10.3)
CO2: 23 mmol/L (ref 22–32)
CREATININE: 1.45 mg/dL — AB (ref 0.61–1.24)
Chloride: 101 mmol/L (ref 101–111)
GFR, EST NON AFRICAN AMERICAN: 54 mL/min — AB (ref >60)
Glucose, Bld: 110 mg/dL — ABNORMAL HIGH (ref 65–99)
Potassium: 4.3 mmol/L (ref 3.5–5.1)
SODIUM: 136 mmol/L (ref 135–145)
Total Protein: 8 g/dL (ref 6.5–8.1)

## 2016-02-08 LAB — GLUCOSE, CAPILLARY
GLUCOSE-CAPILLARY: 187 mg/dL — AB (ref 65–99)
GLUCOSE-CAPILLARY: 301 mg/dL — AB (ref 65–99)
GLUCOSE-CAPILLARY: 192 mg/dL — AB (ref 65–99)
Glucose-Capillary: 95 mg/dL (ref 65–99)

## 2016-02-08 NOTE — Progress Notes (Signed)
PROGRESS NOTE        PATIENT DETAILS Name: Troy [name] Age: 50 y.o. Sex: male Date of Birth: 1966-07-27 Admit Date: 02/02/2016 Admitting Physician Geradine Girt, DO [id number] D, MD  Brief Narrative: Patient is a 50 y.o. male with a past medical history of diabetes, Charcot foot admitted with worsening swelling of his left foot, further evaluation revealed osteomyelitis. Seen by orthopedics, scheduled for left BKA  7/12.  Subjective:  No new complaints. For completion of antibiotics and likely DC in am. No fever or chills  Assessment/Plan:   Osteomyelitis of foot, acute, left: Continue empiric Zosyn/vancomycin, blood cultures negative so far. Seen by orthopedics, plans are for left BKA 7/12. Patient has not been seen by PT/OT. Complete course of antibiotics and DC patient home.   Acute gouty arthritis right hand:   Insulin-dependent type 2 diabetes: Optimize. Hypertension: Optimize.  Chronic kidney disease stage III: Creatinine still close to usual baseline, monitor periodically.  Anemia: Likely has anemia related to acute illness and underlying chronic kidney disease. Follow hemoglobin.  Peripheral neuropathy: Secondary to diabetes, will need outpatient follow-up with PCP and primary podiatrist.  History of bronchial asthma: Lungs clear, continue Breo and prn bronchodilators.  DVT Prophylaxis: Prophylactic Heparin   Code Status: Full code   Family Communication: None at bedside-patient awake and alert-understanding of the above noted plan  Disposition Plan: Remain inpatient-but will plan on Home health vs SNF on discharge-await PT eval post op to determine disposition Anticipate discharge in 2-3 days  Antimicrobial agents: Intravenous vancomycin 7/8>> Intravenous Zosyn 7/8>>  Procedures: None  CONSULTS:  orthopedic surgery  Time spent: 25 minutes-Greater than 50% of this time was spent in counseling, explanation of  diagnosis, planning of further management, and coordination of care.  MEDICATIONS: Anti-infectives    Start     Dose/Rate Route Frequency Ordered Stop   02/03/16 1300  vancomycin (VANCOCIN) 1,500 mg in sodium chloride 0.9 % 500 mL IVPB     1,500 mg 250 mL/hr over 120 Minutes Intravenous Every 24 hours 02/02/16 1310 02/08/16 1429   02/02/16 2000  piperacillin-tazobactam (ZOSYN) IVPB 3.375 g     3.375 g 12.5 mL/hr over 240 Minutes Intravenous Every 8 hours 02/02/16 1459 02/08/16 1933   02/02/16 1930  piperacillin-tazobactam (ZOSYN) IVPB 3.375 g  Status:  Discontinued     3.375 g 12.5 mL/hr over 240 Minutes Intravenous Every 8 hours 02/02/16 1453 02/02/16 1459   02/02/16 1330  vancomycin (VANCOCIN) 2,500 mg in sodium chloride 0.9 % 500 mL IVPB     2,500 mg 250 mL/hr over 120 Minutes Intravenous  Once 02/02/16 1310 02/02/16 1810   02/02/16 1300  piperacillin-tazobactam (ZOSYN) IVPB 3.375 g     3.375 g 100 mL/hr over 30 Minutes Intravenous  Once 02/02/16 1256 02/02/16 1412      Scheduled Meds: . aspirin EC  81 mg Oral Daily  . Chlorhexidine Gluconate Cloth  6 each Topical Q0600  . Chlorhexidine Gluconate Cloth  6 each Topical Q0600  . docusate sodium  100 mg Oral BID  . fluticasone furoate-vilanterol  1 puff Inhalation Daily  . heparin subcutaneous  5,000 Units Subcutaneous Q8H  . insulin aspart  0-15 Units Subcutaneous TID WC  . insulin aspart  0-5 Units Subcutaneous QHS  . insulin glargine  10 Units Subcutaneous QHS  . mupirocin ointment   Nasal BID  .  piperacillin-tazobactam (ZOSYN)  IV  3.375 g Intravenous Q8H  . predniSONE  20 mg Oral Q breakfast   Continuous Infusions: . sodium chloride Stopped (02/06/16 1523)   PRN Meds:.acetaminophen **OR** acetaminophen, albuterol, bisacodyl, dextromethorphan-guaiFENesin, hydrALAZINE, HYDROmorphone (DILAUDID) injection, methocarbamol **OR** methocarbamol (ROBAXIN)  IV, metoCLOPramide **OR** metoCLOPramide (REGLAN) injection, ondansetron  **OR** ondansetron (ZOFRAN) IV, oxyCODONE, polyethylene glycol, sodium chloride   PHYSICAL EXAM: Vital signs: Filed Vitals:   02/07/16 2158 02/08/16 0704 02/08/16 0826 02/08/16 1431  BP: 125/53 158/81  144/84  Pulse: 84 81  76  Temp: 99.3 F (37.4 C) 98.9 F (37.2 C)  98.9 F (37.2 C)  TempSrc: Oral Oral  Oral  Resp: 16 16  16   Height:      Weight:      SpO2: 97% 94% 95% 97%   Filed Weights   02/02/16 1057  Weight: 129.275 kg (285 lb)   Body mass index is 40.89 kg/(m^2).   Gen Exam: Awake and alert with clear speech. Not in any distress  Neck: Supple, No JVD.   Chest: B/L Clear.   CVS: S1 S2 Regular, no murmurs.  Abdomen: soft, BS +, non tender, non distended.  Extremities: Left lower extremity-N dressing-did not open. Some mild swelling in the right dorsal hand near the proximal thumb and index finger Neurologic: Non Focal.   Skin: No Rash or lesions   Wounds: N/A.   LABORATORY DATA: CBC:  Recent Labs Lab 02/02/16 1218 02/03/16 0536 02/04/16 0211 02/06/16 0224 02/08/16 0205  WBC 16.8* 13.0* 11.8* 12.7* 12.8*  NEUTROABS 11.6*  --  7.2  --   --   HGB 10.7* 10.5* 9.9* 9.6* 9.9*  HCT 32.1* 32.1* 29.5* 29.1* 30.1*  MCV 84.3 85.1 82.9 84.3 83.8  PLT 256 274 272 287 [id number]    Basic Metabolic Panel:  Recent Labs Lab 02/03/16 0536 02/04/16 0211 02/06/16 0224 02/07/16 0538 02/08/16 0205  NA 137 137 136 135 136  K 4.3 4.2 4.1 4.3 4.3  CL 106 107 105 103 101  CO2 21* 22 23 23 23   GLUCOSE 116* 105* 108* 100* 110*  BUN 24* 25* 22* 18 16  CREATININE 1.69* 1.64* 1.68* 1.46* 1.45*  CALCIUM 8.9 8.6* 8.4* 8.5* 8.7*    GFR: Estimated Creatinine Clearance: 80.5 mL/min (by C-G formula based on Cr of 1.45).  Liver Function Tests:  Recent Labs Lab 02/02/16 1218 02/04/16 0211 02/06/16 0224 02/08/16 0205  AST 28 36 29 32  ALT 32 47 39 39  ALKPHOS 88 69 67 67  BILITOT 0.6 0.7 0.6 0.4  PROT 7.8 7.5 7.1 8.0  ALBUMIN 2.8* 2.4* 2.2* 2.3*   No results for  input(s): LIPASE, AMYLASE in the last 168 hours. No results for input(s): AMMONIA in the last 168 hours.  Coagulation Profile: No results for input(s): INR, PROTIME in the last 168 hours.  Cardiac Enzymes: No results for input(s): CKTOTAL, CKMB, CKMBINDEX, TROPONINI in the last 168 hours.  BNP (last 3 results) No results for input(s): PROBNP in the last 8760 hours.  HbA1C: No results for input(s): HGBA1C in the last 72 hours.  CBG:  Recent Labs Lab 02/07/16 1601 02/07/16 2157 02/08/16 0703 02/08/16 1125 02/08/16 1748  GLUCAP 158* 155* 95 187* 301*    Lipid Profile: No results for input(s): CHOL, HDL, LDLCALC, TRIG, CHOLHDL, LDLDIRECT in the last 72 hours.  Thyroid Function Tests: No results for input(s): TSH, T4TOTAL, FREET4, T3FREE, THYROIDAB in the last 72 hours.  Anemia Panel: No results for input(s): VITAMINB12,  FOLATE, FERRITIN, TIBC, IRON, RETICCTPCT in the last 72 hours.  Urine analysis:    Component Value Date/Time   COLORURINE YELLOW 02/02/2016 1329   APPEARANCEUR CLEAR 02/02/2016 1329   LABSPEC 1.026 02/02/2016 1329   PHURINE 5.0 02/02/2016 1329   GLUCOSEU NEGATIVE 02/02/2016 1329   HGBUR NEGATIVE 02/02/2016 1329   BILIRUBINUR NEGATIVE 02/02/2016 1329   KETONESUR 15* 02/02/2016 1329   PROTEINUR 30* 02/02/2016 1329   UROBILINOGEN 1.0 01/31/2016 1405   NITRITE NEGATIVE 02/02/2016 1329   LEUKOCYTESUR NEGATIVE 02/02/2016 1329    Sepsis Labs: Lactic Acid, Venous    Component Value Date/Time   LATICACIDVEN 1.17 02/02/2016 1520    MICROBIOLOGY: Recent Results (from the past 240 hour(s))  Culture, blood (Routine x 2)     Status: None   Collection Time: 02/02/16 11:45 AM  Result Value Ref Range Status   Specimen Description BLOOD RIGHT FOREARM  Final   Special Requests BOTTLES DRAWN AEROBIC AND ANAEROBIC  5CC  Final   Culture NO GROWTH 5 DAYS  Final   Report Status 02/07/2016 FINAL  Final  Culture, blood (Routine x 2)     Status: None    Collection Time: 02/02/16 11:55 AM  Result Value Ref Range Status   Specimen Description BLOOD RIGHT HAND  Final   Special Requests BOTTLES DRAWN AEROBIC AND ANAEROBIC  5CC  Final   Culture NO GROWTH 5 DAYS  Final   Report Status 02/07/2016 FINAL  Final  Urine culture     Status: None   Collection Time: 02/02/16  1:29 PM  Result Value Ref Range Status   Specimen Description URINE, CLEAN CATCH  Final   Special Requests NONE  Final   Culture NO GROWTH  Final   Report Status 02/03/2016 FINAL  Final  Surgical pcr screen     Status: Abnormal   Collection Time: 02/05/16  3:35 PM  Result Value Ref Range Status   MRSA, PCR NEGATIVE NEGATIVE Final   Staphylococcus aureus POSITIVE (A) NEGATIVE Final    Comment:        The Xpert SA Assay (FDA approved for NASAL specimens in patients over 28 years of age), is one component of a comprehensive surveillance program.  Test performance has been validated by [facility] for patients greater than or equal to 63 year old. It is not intended to diagnose infection nor to guide or monitor treatment.     RADIOLOGY STUDIES/RESULTS: Dg Chest 2 View  02/02/2016  CLINICAL DATA:  Cough EXAM: CHEST  2 VIEW COMPARISON:  01/31/2016 chest radiograph. FINDINGS: Stable cardiomediastinal silhouette with normal heart size. No pneumothorax. No pleural effusion. Lungs appear clear, with no acute consolidative airspace disease and no pulmonary edema. IMPRESSION: No active cardiopulmonary disease. Electronically Signed   By: Ilona Sorrel M.D.   On: 02/02/2016 11:27   Dg Chest 2 View  01/31/2016  CLINICAL DATA:  Shortness of breath with wheezing and fever EXAM: CHEST  2 VIEW COMPARISON:  Chest radiograph and chest CT August 25, 2015 FINDINGS: There is no apparent edema or consolidation. Heart size and pulmonary vascularity are normal. No adenopathy. There is degenerative change in the thoracic spine. IMPRESSION: No edema or consolidation. Electronically Signed   By:  Lowella Grip III M.D.   On: 01/31/2016 14:20   Dg Wrist 2 Views Right  02/05/2016  CLINICAL DATA:  50 year old with 1 day history of pain and swelling involving the right hand and wrist. Current history of gout, hypertension and diabetes. EXAM: RIGHT  WRIST - 2 VIEW COMPARISON:  None. FINDINGS: No evidence of acute or subacute fracture or dislocation. Well preserved joint spaces. Well preserved bone mineral density. No erosive changes to suggest gout. Mild enthesopathy involving a musculotendinous insertion on the distal ulna. Atherosclerosis involving the radial artery. IMPRESSION: 1. No acute or significant osseous abnormality. 2. Atherosclerosis involving the radial artery. Electronically Signed   By: Evangeline Dakin M.D.   On: 02/05/2016 10:18   Dg Hand 2 View Right  02/05/2016  CLINICAL DATA:  50 year old with 1 day history of pain and swelling involving the right hand and wrist. Current history of gout, hypertension and diabetes. EXAM: RIGHT HAND - 2 VIEW COMPARISON:  None. FINDINGS: No evidence of acute or subacute fracture or dislocation. Well preserved joint spaces. Well preserved bone mineral density. No erosions to suggest gout. Exostosis arising from the distal 2nd metacarpal. IMPRESSION: 1. No acute osseous abnormality. 2. Likely benign osteochondroma Sign arising from the distal 2nd metacarpal. Electronically Signed   By: Evangeline Dakin M.D.   On: 02/05/2016 10:19   Mr Foot Left Wo Contrast  02/04/2016  CLINICAL DATA:  Charcot foot and osteomyelitis.  Diabetes. EXAM: MRI OF THE LEFT FOREFOOT WITHOUT CONTRAST TECHNIQUE: Multiplanar, multisequence MR imaging was performed. No intravenous contrast was administered. COMPARISON:  02/02/2016 radiographs FINDINGS: Chronic Charcot arthropathy with resorption of the vast majority of the midfoot and at the bases of the second through fifth metatarsals. Most of the talus is still present , and the calcaneus and a significant part of the proximal  cuboid is still visible. Cuneiforms are completely fragmented and unrecognizable. In the expected location of the midfoot there is abnormal heterogeneous fluid signal intensity which appears to drain to the plantar foot as on image 28 series 12. This raises concern for superimposed infection, as draining sinus tracks are unusual from sterile Charcot arthropathy. The stranding appears to be occurring to the plantar -lateral surface. There is chronic sclerosis in the second through fifth metatarsals. At the midfoot defect, there is varus angulation between the hindfoot and the remaining portions of the metatarsals. Extensive subcutaneous edema in the remaining foot with apparent blistering along the dorsum of the foot in the vicinity of the first MTP joint. The edema extends into the toes. As expected there is diffuse edema along the plantar musculature of the foot. Erosion along the medial malleolus noted for example on image 21/8. IMPRESSION: 1. Midfoot osteolysis with irregular calcifications corresponding to the original cuneiform is a and anterior cuboid, and bases of the second through fifth metatarsals. Much of this is likely from Charcot arthropathy. However, much of the midfoot is replaced by complex fluid which appears to be draining to the plantar and lateral skin surface -draining sinus tracks are much more characteristic of osteomyelitis rather than Charcot arthropathy and [place] the likelihood of superimposed chronic osteoarthritis seems high. 2. Blistering along the medial side of the first MTP joint with extensive edema tracking in the soft tissues of the foot. Erosion along the inferior margin of the medial malleolus and adjacent talus, although generally the talus and calcaneus are mostly intact otherwise. Electronically Signed   By: Van Clines M.D.   On: 02/04/2016 16:31   Dg Foot Complete Left  02/02/2016  CLINICAL DATA:  Ulcers along foot.  Charcot foot. EXAM: LEFT FOOT - COMPLETE 3+ VIEW  COMPARISON:  Plain films LEFT foot, 08/17/2015 FINDINGS: Compared to exam of 08/17/2015, there is interval osseous erosion at the base of the fifth, fourth,  third, and second metatarsals. There is bone loss within the tarsal bones with noticeable bone loss in cuneiforms. There is a robust periosteal reaction along the shafts of the metatarsals with dense dense sclerosis of the metatarsals proximal. There is interval increase in soft tissue swelling throughout the midfoot. Small amount subcutaneous gas along the plantar surface. IMPRESSION: Interval severe osseous erosion involving the proximal metatarsals and tarsal bones along the metatarsal tarsal joints. Interval marked soft tissue swelling. Differential includes aggressive erosive arthropathy versus OSTEOMYELITIS. Favor severe osteomyelitis. Electronically Signed   By: Suzy Bouchard M.D.   On: 02/02/2016 13:34     LOS: 6 days   Bonnell Public, MD  [facility] Pager:[phone]  If 7PM-7AM, please contact night-coverage [email or web address] Password [facility] 02/08/2016, 6:05 PM

## 2016-02-08 NOTE — Progress Notes (Signed)
Physical Therapy Treatment Patient Details Name: Troy [name] MRN: [id number] DOB: May 25, 1966 Today's Date: 02/08/2016    History of Present Illness 50 y.o. male with a past medical history of diabetes, Charcot foot admitted with worsening swelling of his left foot, further evaluation revealed osteomyelitis. Pt now s/p BKA on 02/06/16. PMH: hypertension, diabetes, goutasthma, obesity, CKD, polyneuropathy    PT Comments    Pt improving gradually with functional mobility. Working on transfers including stand-pivot with rw and squat-pivot. Pt will continue to benefit from on going skilled PT acutely and with HHPT services upon D/C. Pt confirms that the ramp going into his home should be done today.   Follow Up Recommendations  Home health PT;Supervision for mobility/OOB     Equipment Recommendations  Rolling walker with 5" wheels;Wheelchair (measurements PT);Wheelchair cushion (measurements PT)    Recommendations for Other Services       Precautions / Restrictions Precautions Precautions: Fall Required Braces or Orthoses: Knee Immobilizer - Left Knee Immobilizer - Left: On when out of bed or walking Restrictions Weight Bearing Restrictions: Yes LLE Weight Bearing: Non weight bearing    Mobility  Bed Mobility Overal bed mobility: Needs Assistance Bed Mobility: Supine to Sit     Supine to sit: Supervision;HOB elevated (use of rail)     General bed mobility comments: pt reports that he wife just got an adjustable bed for home.   Transfers Overall transfer level: Needs assistance Equipment used: Rolling walker (2 wheeled) Transfers: Sit to/from Marketing executive Transfers Sit to Stand: Min assist;From elevated surface Stand pivot transfers: Min assist (bed to w/c) Squat pivot transfers: Min assist     General transfer comment: squat-pivot performed from/to w/c, min assist and cues needed.   Ambulation/Gait                 Theme park manager mobility: Yes Wheelchair propulsion: Both upper extremities;Right lower extremity (difficulty using rt UE due to limited grip) Wheelchair parts: Needs assistance Wheelchair Assistance Details (indicate cue type and reason): education performed for w/c propulsion, part management.   Modified Rankin (Stroke Patients Only)       Balance Overall balance assessment: Needs assistance Sitting-balance support: No upper extremity supported Sitting balance-Leahy Scale: Good     Standing balance support: Bilateral upper extremity supported Standing balance-Leahy Scale: Poor Standing balance comment: using rw and min guard for balance.                     Cognition Arousal/Alertness: Awake/alert Behavior During Therapy: WFL for tasks assessed/performed Overall Cognitive Status: Within Functional Limits for tasks assessed                      Exercises      General Comments General comments (skin integrity, edema, etc.): reinforced needed for knee extension. Pt reports standard rw should be fine, grip on Rt hand is improving      Pertinent Vitals/Pain Pain Assessment: Faces Faces Pain Scale: Hurts little more Pain Location: LLE Pain Descriptors / Indicators: Grimacing Pain Intervention(s): Limited activity within patient's tolerance;Monitored during session    Home Living                      Prior Function            PT Goals (current goals can now be found in the care plan section)  Acute Rehab PT Goals Patient Stated Goal: be independent again PT Goal Formulation: With patient Time For Goal Achievement: 02/21/16 Potential to Achieve Goals: Good Progress towards PT goals: Progressing toward goals    Frequency  Min 5X/week    PT Plan Current plan remains appropriate    Co-evaluation             End of Session Equipment Utilized During Treatment: Gait belt;Left knee  immobilizer Activity Tolerance: Patient tolerated treatment well Patient left: with call bell/phone within reach (in w/c)     Time: [id number] PT Time Calculation (min) (ACUTE ONLY): 29 min  Charges:  $Therapeutic Activity: 23-37 mins                    G Codes:      Cassell Clement, PT, CSCS Pager [phone] Office [phone]  02/08/2016, 9:39 AM

## 2016-02-08 NOTE — Progress Notes (Signed)
Patient ID: Troy [name], male   DOB: May 27, 1966, 50 y.o.   MRN: [id number] OK for discharge from my standpoint. I can see him next week.He will need W/C for home with elevated leg rest.  Long knee immobilizer he will use when he sleeps and when he is up walking which will protect the end of his BKA stump in case he falls or gets up in the middle of the night to go to the bathroom and forgets he had a BKA. Dressing can stay on until I see him.

## 2016-02-08 NOTE — Progress Notes (Signed)
Occupational Therapy Treatment Patient Details Name: Troy [name] MRN: [id number] DOB: [birth year] Today's Date: 02/08/2016    History of present illness 50 y.o. male with a past medical history of diabetes, Charcot foot admitted with worsening swelling of his left foot, further evaluation revealed osteomyelitis. Pt now s/p BKA on 02/06/16. PMH: hypertension, diabetes, goutasthma, obesity, CKD, polyneuropathy   OT comments  Pt able to perform SPT back to bed. Educated on [employer] and issued to him (written and band) but did not practice this session as pt was sleepy  Follow Up Recommendations  Home health OT;Supervision/Assistance - 24 hour    Equipment Recommendations  3 in 1 bedside comode    Recommendations for Other Services      Precautions / Restrictions Precautions Precautions: Fall Required Braces or Orthoses: Knee Immobilizer - Left Knee Immobilizer - Left: On when out of bed or walking Restrictions LLE Weight Bearing: Non weight bearing       Mobility Bed Mobility           Sit to supine: Supervision      Transfers   Equipment used: Rolling walker (2 wheeled)   Sit to Stand: Min assist;Mod assist (pt 70%) Stand pivot transfers: Min assist       General transfer comment: SPT to bed, hopping    Balance                                   ADL                           Toilet Transfer: Minimal assistance;Stand-pivot;RW (to bed)             General ADL Comments: educated on weight shifting vs rolling in bed for adls.  Pt did not wish to perform any adls at this time. Assisted back to bed.  Pt has a R handed gout flare up:  provided built up foam to make it easier for him to eat with R hand.  He also stated that he wanted to work on bil UE strength.  Provided both Level 3 and 4 band with written HEP for FF, extension, elbow flexion and extension.  He can tie band to w/c for flexion and have someone hold band for  extension.  Educated not to hold in R hand at this time for safety.  Pt is familiar with tub bench, but he doesn't have one. He will likely have to hop into bathroom due to width of door.        Vision                     Perception     Praxis      Cognition   Behavior During Therapy: WFL for tasks assessed/performed Overall Cognitive Status: Within Functional Limits for tasks assessed                       Extremity/Trunk Assessment               Exercises     Shoulder Instructions       General Comments      Pertinent Vitals/ Pain       Pain Assessment: Faces Faces Pain Scale: Hurts little more Pain Location: LLE Pain Descriptors / Indicators: Sore Pain Intervention(s): Limited activity within patient's tolerance;Monitored during session;Repositioned  Home  Living                                          Prior Functioning/Environment              Frequency       Progress Toward Goals  OT Goals(current goals can now be found in the care plan section)  Progress towards OT goals: Progressing toward goals     Plan      Co-evaluation                 End of Session Equipment Utilized During Treatment: Gait belt;Rolling walker;Left knee immobilizer   Activity Tolerance Patient tolerated treatment well   Patient Left in bed;with call bell/phone within reach   Nurse Communication          Time: [id number] and 1540-1545 OT Time Calculation (min): 23 min  Charges: OT General Charges $OT Visit: 1 Procedure OT Treatments $Therapeutic Activity: 23-37 mins  Atoya Andrew 02/08/2016, 3:54 PM  Lesle Chris, OTR/L [phone] 02/08/2016

## 2016-02-09 DIAGNOSIS — N182 Chronic kidney disease, stage 2 (mild): Secondary | ICD-10-CM

## 2016-02-09 DIAGNOSIS — E1142 Type 2 diabetes mellitus with diabetic polyneuropathy: Secondary | ICD-10-CM

## 2016-02-09 DIAGNOSIS — Z794 Long term (current) use of insulin: Secondary | ICD-10-CM

## 2016-02-09 LAB — BASIC METABOLIC PANEL
ANION GAP: 10 (ref 5–15)
BUN: 17 mg/dL (ref 6–20)
CALCIUM: 9 mg/dL (ref 8.9–10.3)
CO2: 26 mmol/L (ref 22–32)
Chloride: 104 mmol/L (ref 101–111)
Creatinine, Ser: 1.39 mg/dL — ABNORMAL HIGH (ref 0.61–1.24)
GFR, EST NON AFRICAN AMERICAN: 57 mL/min — AB (ref >60)
GLUCOSE: 85 mg/dL (ref 65–99)
Potassium: 4.1 mmol/L (ref 3.5–5.1)
SODIUM: 140 mmol/L (ref 135–145)

## 2016-02-09 LAB — GLUCOSE, CAPILLARY
GLUCOSE-CAPILLARY: 94 mg/dL (ref 65–99)
GLUCOSE-CAPILLARY: 128 mg/dL — AB (ref 65–99)

## 2016-02-09 MED ORDER — MUPIROCIN 2 % EX OINT: TOPICAL_OINTMENT | Freq: Two times a day (BID) | CUTANEOUS | Status: AC

## 2016-02-09 MED ORDER — OXYCODONE HCL 5 MG PO TABS: 5.0000 mg | ORAL_TABLET | ORAL | Status: DC | PRN

## 2016-02-09 MED ORDER — DOCUSATE SODIUM 100 MG PO CAPS
100.0000 mg | ORAL_CAPSULE | Freq: Two times a day (BID) | ORAL | Status: DC
Start: 2016-02-09 — End: 2016-09-12

## 2016-02-09 NOTE — Progress Notes (Signed)
Patient ID: Troy [name], male   DOB: Jul 31, 1965, 50 y.o.   MRN: [id number] Ready for discharge from Orthopedic standpoint. Office next week.

## 2016-02-09 NOTE — Discharge Summary (Signed)
Physician Discharge Summary  Troy [name] [id number] DOB: March 22, 1966 DOA: 02/02/2016  PCP: Kandice Hams, MD  Admit date: 02/02/2016 Discharge date: 02/09/2016  Time spent: Greater than 30 minutes  Recommendations for Outpatient Follow-up:  1. Follow up with PCP and Orthopedics within one week.   Discharge Diagnoses:  Principal Problem:   Osteomyelitis of foot, acute, left Active Problems:   Hypertension   Diabetes ([place])   Diabetic foot ([facility])   Osteomyelitis ([facility])   CKD (chronic kidney disease), stage II   Anemia   Acute osteomyelitis of left foot ([place])   Discharge Condition: Stable  Diet recommendation: Diabetic/Cardiac diet  Filed Weights   02/02/16 1057  Weight: 129.275 kg (285 lb)    History of present illness: 50 year old male with medical history significant for diabetes, hypertension,charcot foot, presented to the emergency department with the wound infection involving the left lower extremity. Initial evaluation was concerning for osteomyelitis left foot. Patient reported worsening of the swelling in the left foot for several days prior to admission. A day prior to admission, patient went to and Urgent care and was given amoxicillin for complaints of cold symptoms. On the day of admission, patient noted purulent drainage from the left foot, with associated chills, fever, decreased appetite and general malaise.    Hospital Course: Patient was admitted for further assessment of diabetic ulcer with osteomyelitis. Patient was started on broad spectrum antibiotics, and orthopedic team was consulted. Patient underwent left below knee amputation. Patient was continued on IV antibiotics for 2 days post amputation. Patient has remained stable and will be discharged back home today with Home Health. Patient will follow up with PCP and Orthopedic team in one week.  There will be need for the PCP to monitor patient's renal function closely considering that patient is on  Metformin. PCP will also decide when to restart patient's lisinopril.  Procedures:  Left BKA.  Consultations:  Orthopedic Team  Discharge Exam: Filed Vitals:   02/09/16 0515 02/09/16 0910  BP: 151/89   Pulse: 80 91  Temp: 98.8 F (37.1 C)   Resp: 16 16    General: Not in distress. AAO X 3. Cardiovascular: S1S2 Respiratory: Clear to auscultation  Discharge Instructions   Discharge Instructions    Diet - low sodium heart healthy    Complete by:  As directed      Diet Carb Modified    Complete by:  As directed      Discharge instructions    Complete by:  As directed   Follow up with PCP within one week. Follow up with Ortho in one week     Increase activity slowly    Complete by:  As directed           Current Discharge Medication List    START taking these medications   Details  docusate sodium (COLACE) 100 MG capsule Take 1 capsule (100 mg total) by mouth 2 (two) times daily. Qty: 10 capsule, Refills: 0    mupirocin ointment (BACTROBAN) 2 % Place into the nose 2 (two) times daily. Qty: 22 g, Refills: 0      CONTINUE these medications which have NOT CHANGED   Details  acetaminophen (TYLENOL) 500 MG tablet Take 1,000 mg by mouth every 6 (six) hours as needed for moderate pain.    albuterol (PROVENTIL HFA;VENTOLIN HFA) 108 (90 Base) MCG/ACT inhaler Inhale 1-2 puffs into the lungs every 6 (six) hours as needed for wheezing or shortness of breath. Qty: 1 Inhaler, Refills:  0    BREO ELLIPTA 100-25 MCG/INH AEPB Inhale 1 puff into the lungs daily.  Refills: 5    cetirizine (ZYRTEC) 10 MG tablet Take 10 mg by mouth daily.    cyclobenzaprine (FLEXERIL) 10 MG tablet Take 0.5-1 tablets (5-10 mg total) by mouth 2 (two) times daily as needed. Qty: 20 tablet, Refills: 0    glimepiride (AMARYL) 4 MG tablet Take 4 mg by mouth daily with breakfast.    LANTUS SOLOSTAR 100 UNIT/ML Solostar Pen Inject 10 Units into the skin daily. Refills: 11    metFORMIN (GLUCOPHAGE)  500 MG tablet Take 1,000 mg by mouth 2 (two) times daily.  Refills: 5    traMADol (ULTRAM) 50 MG tablet Take 1 tablet by mouth every 6 (six) hours as needed. pain Refills: 1    VIAGRA 100 MG tablet Take 100 mg by mouth as needed for erectile dysfunction.  Refills: 11    aspirin (ASPIRIN EC) 81 MG EC tablet Take 81 mg by mouth daily. Swallow whole.      STOP taking these medications     amoxicillin-clavulanate (AUGMENTIN) 875-125 MG tablet      lisinopril-hydrochlorothiazide (PRINZIDE,ZESTORETIC) 10-12.5 MG tablet        Allergies  Allergen Reactions  . No Known Allergies    Follow-up Information    Follow up with YATES,MARK C, MD In 1 week.   Specialty:  Orthopedic Surgery   Contact information:   [place] [address] [phone]       Follow up with [facility].   Why:  Someone from [facility] will contact you to arrange start date and time for therapy.   Contact information:   [address] [place] [address] [phone]       Follow up with Marybelle Killings, MD In 1 week.   Specialty:  Orthopedic Surgery   Contact information:   [place] [address] [phone]       Follow up with Kandice Hams, MD In 1 week.   Specialty:  Internal Medicine   Why:  Follow up with Ortho in one week   Contact information:   [address] [place] [address] [phone]        The results of significant diagnostics from this hospitalization (including imaging, microbiology, ancillary and laboratory) are listed below for reference.    Significant Diagnostic Studies: Dg Chest 2 View  02/02/2016  CLINICAL DATA:  Cough EXAM: CHEST  2 VIEW COMPARISON:  01/31/2016 chest radiograph. FINDINGS: Stable cardiomediastinal silhouette with normal heart size. No pneumothorax. No pleural effusion. Lungs appear clear, with no acute consolidative airspace disease and no pulmonary edema.  IMPRESSION: No active cardiopulmonary disease. Electronically Signed   By: Ilona Sorrel M.D.   On: 02/02/2016 11:27   Dg Chest 2 View  01/31/2016  CLINICAL DATA:  Shortness of breath with wheezing and fever EXAM: CHEST  2 VIEW COMPARISON:  Chest radiograph and chest CT August 25, 2015 FINDINGS: There is no apparent edema or consolidation. Heart size and pulmonary vascularity are normal. No adenopathy. There is degenerative change in the thoracic spine. IMPRESSION: No edema or consolidation. Electronically Signed   By: Lowella Grip III M.D.   On: 01/31/2016 14:20   Dg Wrist 2 Views Right  02/05/2016  CLINICAL DATA:  50 year old with 1 day history of pain and swelling involving the right hand and wrist. Current history of gout, hypertension and diabetes. EXAM:  RIGHT WRIST - 2 VIEW COMPARISON:  None. FINDINGS: No evidence of acute or subacute fracture or dislocation. Well preserved joint spaces. Well preserved bone mineral density. No erosive changes to suggest gout. Mild enthesopathy involving a musculotendinous insertion on the distal ulna. Atherosclerosis involving the radial artery. IMPRESSION: 1. No acute or significant osseous abnormality. 2. Atherosclerosis involving the radial artery. Electronically Signed   By: Evangeline Dakin M.D.   On: 02/05/2016 10:18   Dg Hand 2 View Right  02/05/2016  CLINICAL DATA:  50 year old with 1 day history of pain and swelling involving the right hand and wrist. Current history of gout, hypertension and diabetes. EXAM: RIGHT HAND - 2 VIEW COMPARISON:  None. FINDINGS: No evidence of acute or subacute fracture or dislocation. Well preserved joint spaces. Well preserved bone mineral density. No erosions to suggest gout. Exostosis arising from the distal 2nd metacarpal. IMPRESSION: 1. No acute osseous abnormality. 2. Likely benign osteochondroma Sign arising from the distal 2nd metacarpal. Electronically Signed   By: Evangeline Dakin M.D.   On: 02/05/2016 10:19   Mr  Foot Left Wo Contrast  02/04/2016  CLINICAL DATA:  Charcot foot and osteomyelitis.  Diabetes. EXAM: MRI OF THE LEFT FOREFOOT WITHOUT CONTRAST TECHNIQUE: Multiplanar, multisequence MR imaging was performed. No intravenous contrast was administered. COMPARISON:  02/02/2016 radiographs FINDINGS: Chronic Charcot arthropathy with resorption of the vast majority of the midfoot and at the bases of the second through fifth metatarsals. Most of the talus is still present , and the calcaneus and a significant part of the proximal cuboid is still visible. Cuneiforms are completely fragmented and unrecognizable. In the expected location of the midfoot there is abnormal heterogeneous fluid signal intensity which appears to drain to the plantar foot as on image 28 series 12. This raises concern for superimposed infection, as draining sinus tracks are unusual from sterile Charcot arthropathy. The stranding appears to be occurring to the plantar -lateral surface. There is chronic sclerosis in the second through fifth metatarsals. At the midfoot defect, there is varus angulation between the hindfoot and the remaining portions of the metatarsals. Extensive subcutaneous edema in the remaining foot with apparent blistering along the dorsum of the foot in the vicinity of the first MTP joint. The edema extends into the toes. As expected there is diffuse edema along the plantar musculature of the foot. Erosion along the medial malleolus noted for example on image 21/8. IMPRESSION: 1. Midfoot osteolysis with irregular calcifications corresponding to the original cuneiform is a and anterior cuboid, and bases of the second through fifth metatarsals. Much of this is likely from Charcot arthropathy. However, much of the midfoot is replaced by complex fluid which appears to be draining to the plantar and lateral skin surface -draining sinus tracks are much more characteristic of osteomyelitis rather than Charcot arthropathy and [place] the  likelihood of superimposed chronic osteoarthritis seems high. 2. Blistering along the medial side of the first MTP joint with extensive edema tracking in the soft tissues of the foot. Erosion along the inferior margin of the medial malleolus and adjacent talus, although generally the talus and calcaneus are mostly intact otherwise. Electronically Signed   By: Van Clines M.D.   On: 02/04/2016 16:31   Dg Foot Complete Left  02/02/2016  CLINICAL DATA:  Ulcers along foot.  Charcot foot. EXAM: LEFT FOOT - COMPLETE 3+ VIEW COMPARISON:  Plain films LEFT foot, 08/17/2015 FINDINGS: Compared to exam of 08/17/2015, there is interval osseous erosion at the base of the fifth,  fourth, third, and second metatarsals. There is bone loss within the tarsal bones with noticeable bone loss in cuneiforms. There is a robust periosteal reaction along the shafts of the metatarsals with dense dense sclerosis of the metatarsals proximal. There is interval increase in soft tissue swelling throughout the midfoot. Small amount subcutaneous gas along the plantar surface. IMPRESSION: Interval severe osseous erosion involving the proximal metatarsals and tarsal bones along the metatarsal tarsal joints. Interval marked soft tissue swelling. Differential includes aggressive erosive arthropathy versus OSTEOMYELITIS. Favor severe osteomyelitis. Electronically Signed   By: Suzy Bouchard M.D.   On: 02/02/2016 13:34    Microbiology: Recent Results (from the past 240 hour(s))  Culture, blood (Routine x 2)     Status: None   Collection Time: 02/02/16 11:45 AM  Result Value Ref Range Status   Specimen Description BLOOD RIGHT FOREARM  Final   Special Requests BOTTLES DRAWN AEROBIC AND ANAEROBIC  5CC  Final   Culture NO GROWTH 5 DAYS  Final   Report Status 02/07/2016 FINAL  Final  Culture, blood (Routine x 2)     Status: None   Collection Time: 02/02/16 11:55 AM  Result Value Ref Range Status   Specimen Description BLOOD RIGHT HAND   Final   Special Requests BOTTLES DRAWN AEROBIC AND ANAEROBIC  5CC  Final   Culture NO GROWTH 5 DAYS  Final   Report Status 02/07/2016 FINAL  Final  Urine culture     Status: None   Collection Time: 02/02/16  1:29 PM  Result Value Ref Range Status   Specimen Description URINE, CLEAN CATCH  Final   Special Requests NONE  Final   Culture NO GROWTH  Final   Report Status 02/03/2016 FINAL  Final  Surgical pcr screen     Status: Abnormal   Collection Time: 02/05/16  3:35 PM  Result Value Ref Range Status   MRSA, PCR NEGATIVE NEGATIVE Final   Staphylococcus aureus POSITIVE (A) NEGATIVE Final    Comment:        The Xpert SA Assay (FDA approved for NASAL specimens in patients over 74 years of age), is one component of a comprehensive surveillance program.  Test performance has been validated by [facility] for patients greater than or equal to 64 year old. It is not intended to diagnose infection nor to guide or monitor treatment.      Labs: Basic Metabolic Panel:  Recent Labs Lab 02/04/16 0211 02/06/16 0224 02/07/16 0538 02/08/16 0205 02/09/16 0532  NA 137 136 135 136 140  K 4.2 4.1 4.3 4.3 4.1  CL 107 105 103 101 104  CO2 22 23 23 23 26   GLUCOSE 105* 108* 100* 110* 85  BUN 25* 22* 18 16 17   CREATININE 1.64* 1.68* 1.46* 1.45* 1.39*  CALCIUM 8.6* 8.4* 8.5* 8.7* 9.0   Liver Function Tests:  Recent Labs Lab 02/02/16 1218 02/04/16 0211 02/06/16 0224 02/08/16 0205  AST 28 36 29 32  ALT 32 47 39 39  ALKPHOS 88 69 67 67  BILITOT 0.6 0.7 0.6 0.4  PROT 7.8 7.5 7.1 8.0  ALBUMIN 2.8* 2.4* 2.2* 2.3*   No results for input(s): LIPASE, AMYLASE in the last 168 hours. No results for input(s): AMMONIA in the last 168 hours. CBC:  Recent Labs Lab 02/02/16 1218 02/03/16 0536 02/04/16 0211 02/06/16 0224 02/08/16 0205  WBC 16.8* 13.0* 11.8* 12.7* 12.8*  NEUTROABS 11.6*  --  7.2  --   --   HGB 10.7* 10.5* 9.9* 9.6*  9.9*  HCT 32.1* 32.1* 29.5* 29.1* 30.1*  MCV 84.3  85.1 82.9 84.3 83.8  PLT 256 274 272 287 330   Cardiac Enzymes: No results for input(s): CKTOTAL, CKMB, CKMBINDEX, TROPONINI in the last 168 hours. BNP: BNP (last 3 results)  Recent Labs  08/25/15 1628  BNP 15.7    ProBNP (last 3 results) No results for input(s): PROBNP in the last 8760 hours.  CBG:  Recent Labs Lab 02/08/16 0703 02/08/16 1125 02/08/16 1748 02/08/16 2132 02/09/16 0546  GLUCAP 95 187* 301* 192* 94       Signed:  Dana Allan, MD  [facility] Pager #: [phone] 7PM-7AM contact night coverage as above

## 2016-02-09 NOTE — Progress Notes (Signed)
CM received call from RN to please arrange for [facility] services and DME.  CM notes previous CM has set pt up with [facility] and pt has already received a wheelchair and rolling walker but needs a 3n1.  CM called [facility] DME rep, Germaine to please deliver the 3n1 to room so pt can discharge.  CM notified [facility] rep, Tiffany of pt discharge. NO other CM needs were communicated.

## 2016-02-10 NOTE — Progress Notes (Signed)
Discharge instructions provided to patient and wife.  Prescriptions provided to patient; requested pain medication.  MD notified.  Equipment delivered to room (3-in-1, wheelchair and walker).  No questions at time of discharge.  Wife to take patient home.

## 2016-04-09 ENCOUNTER — Ambulatory Visit (INDEPENDENT_AMBULATORY_CARE_PROVIDER_SITE_OTHER): Payer: BLUE CROSS/BLUE SHIELD | Admitting: Podiatry

## 2016-04-09 ENCOUNTER — Encounter: Payer: Self-pay | Admitting: Podiatry

## 2016-04-09 DIAGNOSIS — L84 Corns and callosities: Secondary | ICD-10-CM | POA: Diagnosis not present

## 2016-04-09 DIAGNOSIS — E0841 Diabetes mellitus due to underlying condition with diabetic mononeuropathy: Secondary | ICD-10-CM | POA: Diagnosis not present

## 2016-04-09 DIAGNOSIS — Q828 Other specified congenital malformations of skin: Secondary | ICD-10-CM | POA: Diagnosis not present

## 2016-04-09 DIAGNOSIS — B351 Tinea unguium: Secondary | ICD-10-CM

## 2016-04-09 DIAGNOSIS — M79605 Pain in left leg: Secondary | ICD-10-CM

## 2016-04-09 DIAGNOSIS — M79674 Pain in right toe(s): Secondary | ICD-10-CM

## 2016-04-09 DIAGNOSIS — M79604 Pain in right leg: Secondary | ICD-10-CM

## 2016-04-10 NOTE — Progress Notes (Signed)
Subjective:     Patient ID: Troy [name], male   DOB: July 22, 1966, 50 y.o.   MRN: [id number]  HPI patient presents Troy [name] very poor health and developed a serious foot infection several months ago in his left midfoot and ended up requiring BK amputation. He has digital deformity with keratotic lesion distal fourth toe right and nail disease 1-5 right foot that's incurvated and can become painful   Review of Systems     Objective:   Physical Exam Neurovascular status diminished right but unchanged from previous visit with keratotic lesion distal lateral fourth digit with no drainage and nail disease 1-5 right that are thick yellow and incurvated    Assessment:     Chronic lesion formation with mycotic nail infection 1-5 right    Plan:     Debride lesion on the right foot with no drainage or iatrogenic bleeding noted and debrided nailbeds 1-5 right with no iatrogenic bleeding. Instructed on the importance of watching this carefully and if any changes were to occur in his foot he is to reappoint immediately to see what the physicians in the group

## 2016-04-14 DIAGNOSIS — M79604 Pain in right leg: Secondary | ICD-10-CM

## 2016-05-23 ENCOUNTER — Encounter (HOSPITAL_COMMUNITY): Payer: Self-pay | Admitting: Emergency Medicine

## 2016-05-23 ENCOUNTER — Emergency Department (HOSPITAL_COMMUNITY): Payer: BLUE CROSS/BLUE SHIELD

## 2016-05-23 ENCOUNTER — Other Ambulatory Visit: Payer: Self-pay

## 2016-05-23 ENCOUNTER — Emergency Department (HOSPITAL_COMMUNITY)
Admission: EM | Admit: 2016-05-23 | Discharge: 2016-05-23 | Disposition: A | Discharge status: Home / Self Care | Payer: BLUE CROSS/BLUE SHIELD | Attending: Emergency Medicine | Admitting: Emergency Medicine

## 2016-05-23 DIAGNOSIS — Z79899 Other long term (current) drug therapy: Secondary | ICD-10-CM | POA: Insufficient documentation

## 2016-05-23 DIAGNOSIS — J4541 Moderate persistent asthma with (acute) exacerbation: Secondary | ICD-10-CM

## 2016-05-23 DIAGNOSIS — E1122 Type 2 diabetes mellitus with diabetic chronic kidney disease: Secondary | ICD-10-CM | POA: Insufficient documentation

## 2016-05-23 DIAGNOSIS — Z7982 Long term (current) use of aspirin: Secondary | ICD-10-CM | POA: Diagnosis not present

## 2016-05-23 DIAGNOSIS — N182 Chronic kidney disease, stage 2 (mild): Secondary | ICD-10-CM | POA: Diagnosis not present

## 2016-05-23 DIAGNOSIS — J310 Chronic rhinitis: Secondary | ICD-10-CM | POA: Insufficient documentation

## 2016-05-23 DIAGNOSIS — I129 Hypertensive chronic kidney disease with stage 1 through stage 4 chronic kidney disease, or unspecified chronic kidney disease: Secondary | ICD-10-CM | POA: Insufficient documentation

## 2016-05-23 DIAGNOSIS — Z7984 Long term (current) use of oral hypoglycemic drugs: Secondary | ICD-10-CM | POA: Insufficient documentation

## 2016-05-23 DIAGNOSIS — R0602 Shortness of breath: Secondary | ICD-10-CM | POA: Diagnosis present

## 2016-05-23 LAB — CBG MONITORING, ED: GLUCOSE-CAPILLARY: 94 mg/dL (ref 65–99)

## 2016-05-23 MED ORDER — FLUTICASONE PROPIONATE 50 MCG/ACT NA SUSP: 2.0000 | Freq: Every day | NASAL | 0 refills | Status: DC

## 2016-05-23 MED ORDER — PREDNISONE 20 MG PO TABS
60.0000 mg | ORAL_TABLET | Freq: Once | ORAL | Status: AC
  Administered 2016-05-23: 60 mg via ORAL
  Filled 2016-05-23: qty 3

## 2016-05-23 MED ORDER — IPRATROPIUM-ALBUTEROL 0.5-2.5 (3) MG/3ML IN SOLN
3.0000 mL | Freq: Once | RESPIRATORY_TRACT | Status: AC
  Administered 2016-05-23: 3 mL via RESPIRATORY_TRACT
  Filled 2016-05-23: qty 3

## 2016-05-23 MED ORDER — PREDNISONE 20 MG PO TABS: ORAL_TABLET | ORAL | 0 refills | Status: DC

## 2016-05-23 NOTE — Discharge Instructions (Signed)
You may use Aftrin nasal spray to help your nasal congestion, but do not use it for more than three days.

## 2016-05-23 NOTE — ED Provider Notes (Signed)
[facility] DEPT Provider Note   CSN: [id number] Arrival date & time: 05/23/16  [id number]  By signing my name below, I, Troy [name], attest that this documentation has been prepared under the direction and in the presence of Delora Fuel, MD. Electronically Signed: Judithann [name], ED Scribe. 05/23/16. 12:59 AM.   History   Chief Complaint Chief Complaint  Patient presents with  . Shortness of Breath   HPI Comments: Troy [name] is a 50 y.o. male with a hx of hypertension, DM, and asthma who presents to the Emergency Department complaining of sudden onset, moderate shortness of breath consistent with his asthma exacerbation onset several hours ago. He reports that he had moderate pain to his posterior neck during his exacerbation but denies any known injuries or trauma. He adds associated nasal congestion and non-productive cough for the past several days. Pt states that he took Sudafed and his rescue inhaler prior to EMS arrival which provided moderate relief of his SOB. No other alleviating factors noted. Pt has NKDA. He denies any fever, chills, chest pain, nausea, or vomiting.   Pt also c/o a darkened area to the sole of his right foot. He states that his wife noticed the area yesterday. Pt reports a hx of neuropathy in that foot and diabetic foot. No other complaints at this time.    The history is provided by the patient. No language interpreter was used.  Shortness of Breath  This is a chronic problem. The current episode started 1 to 2 hours ago. The problem has been gradually improving. Associated symptoms include neck pain and cough. Pertinent negatives include no fever, no chest pain and no vomiting. Associated medical issues include asthma.    Past Medical History:  Diagnosis Date  . Asthma   . Bronchitis   . CKD (chronic kidney disease)   . Diabetes mellitus   . Diabetic Charcot foot ([place])    Left  . Gout   . Hypertension   . Kidney stones 11/30/2013  .  Neuromuscular disorder ([place])   . Obesity   . Polyneuropathy in diabetes [facility])     Patient Active Problem List   Diagnosis Date Noted  . Acute osteomyelitis of left foot ([place])   . Diabetic foot ([place]) 02/02/2016  . Osteomyelitis ([place]) 02/02/2016  . Osteomyelitis of foot, acute, left 02/02/2016  . Diabetic foot infection ([place]) 02/02/2016  . CKD (chronic kidney disease), stage II 02/02/2016  . Anemia 02/02/2016  . Kidney stones 11/30/2013  . Cholelithiasis: PER CT ABD/PELVIS AND ABD [place] 11/30/13 11/30/2013  . Acute renal failure ([place]) 11/29/2013  . Acute pancreatitis 11/29/2013  . Asthma exacerbation 11/09/2013  . CAP (community acquired pneumonia) 07/18/2013  . Diabetes ([place]) 07/18/2013  . Hypertension     Past Surgical History:  Procedure Laterality Date  . AMPUTATION Left 02/06/2016   Procedure: AMPUTATION BELOW KNEE;  Surgeon: Marybelle Killings, MD;  Location: [place];  Service: Orthopedics;  Laterality: Left;  . CHOLECYSTECTOMY N/A 12/03/2013   Procedure: LAPAROSCOPIC CHOLECYSTECTOMY WITH INTRAOPERATIVE CHOLANGIOGRAM;  Surgeon: Rolm Bookbinder, MD;  Location: [facility];  Service: General;  Laterality: N/A;  . NO PAST SURGERIES         Home Medications    Prior to Admission medications   Medication Sig Start Date End Date Taking? Authorizing Provider  acetaminophen (TYLENOL) 500 MG tablet Take 1,000 mg by mouth every 6 (six) hours as needed for moderate pain.    Historical Provider, MD  albuterol (PROVENTIL HFA;VENTOLIN HFA) 108 (90 Base)  MCG/ACT inhaler Inhale 1-2 puffs into the lungs every 6 (six) hours as needed for wheezing or shortness of breath. 08/25/15   Hyman Bible, PA-C  aspirin (ASPIRIN EC) 81 MG EC tablet Take 81 mg by mouth daily. Swallow whole.    Historical Provider, MD  BREO ELLIPTA 100-25 MCG/INH AEPB Inhale 1 puff into the lungs daily.  07/31/15   Historical Provider, MD  cetirizine (ZYRTEC) 10 MG tablet Take 10 mg by mouth daily.    Historical Provider, MD    cyclobenzaprine (FLEXERIL) 10 MG tablet Take 0.5-1 tablets (5-10 mg total) by mouth 2 (two) times daily as needed. 06/03/15   Tiffany Carlota Raspberry, PA-C  docusate sodium (COLACE) 100 MG capsule Take 1 capsule (100 mg total) by mouth 2 (two) times daily. 02/09/16   Bonnell Public, MD  glimepiride (AMARYL) 4 MG tablet Take 4 mg by mouth daily with breakfast.    Historical Provider, MD  LANTUS SOLOSTAR 100 UNIT/ML Solostar Pen Inject 10 Units into the skin daily. 01/10/16   Historical Provider, MD  metFORMIN (GLUCOPHAGE) 500 MG tablet Take 1,000 mg by mouth 2 (two) times daily.  06/29/14   Historical Provider, MD  traMADol (ULTRAM) 50 MG tablet Take 1 tablet by mouth every 6 (six) hours as needed. pain 11/14/15   Historical Provider, MD  VIAGRA 100 MG tablet Take 100 mg by mouth as needed for erectile dysfunction.  06/19/14   Historical Provider, MD    Family History Family History  Problem Relation Age of Onset  . Diabetes Mellitus II Father   . Diabetes Father   . Hypertension Father   . Diabetes Mother   . Hypertension Mother   . CAD Neg Hx     Social History Social History  Substance Use Topics  . Smoking status: Never Smoker  . Smokeless tobacco: Never Used  . Alcohol use Yes     Comment: occasionally     Allergies   No known allergies   Review of Systems Review of Systems  Constitutional: Negative for chills and fever.  HENT: Positive for congestion.   Respiratory: Positive for cough and shortness of breath.   Cardiovascular: Negative for chest pain.  Gastrointestinal: Negative for nausea and vomiting.  Musculoskeletal: Positive for neck pain.  All other systems reviewed and are negative.    Physical Exam Updated Vital Signs BP 108/68 (BP Location: Right Arm)   Pulse 81   Temp 97.9 F (36.6 C) (Oral)   Resp 18   Ht 5\' 10"  (1.778 m)   Wt 260 lb (117.9 kg)   SpO2 98%   BMI 37.31 kg/m   Physical Exam  Constitutional: He is oriented to person, place, and time. He  appears well-developed and well-nourished.  HENT:  Head: Normocephalic and atraumatic.  Eyes: EOM are normal. Pupils are equal, round, and reactive to light.  Neck: Normal range of motion. Neck supple. No JVD present.  Cardiovascular: Normal rate, regular rhythm and normal heart sounds.   No murmur heard. Pulmonary/Chest: Effort normal. He has wheezes. He has no rales. He exhibits no tenderness.  Few scattered wheezes   Abdominal: Soft. Bowel sounds are normal. He exhibits no distension and no mass. There is no tenderness.  Musculoskeletal: Normal range of motion. He exhibits no edema.  Left below the knee amputation  Lymphadenopathy:    He has no cervical adenopathy.  Neurological: He is alert and oriented to person, place, and time. No cranial nerve deficit. He exhibits normal muscle tone. Coordination  normal.  Skin: Skin is warm and dry. No rash noted.  Psychiatric: He has a normal mood and affect. His behavior is normal. Judgment and thought content normal.  Nursing note and vitals reviewed.    ED Treatments / Results  DIAGNOSTIC STUDIES: Oxygen Saturation is 98% on RA, normal by my interpretation.    COORDINATION OF CARE: 12:55 AM - Pt advised of plan for treatment and pt agrees. Pt will receive POC CBG and chest x-ray for further evaluation. He will also receive Duoneb and prednisone.    Labs (all labs ordered are listed, but only abnormal results are displayed) Labs Reviewed  CBG MONITORING, ED     EKG ECG shows normal sinus rhythm with a rate of 81, low QRS voltage, no ectopy. Normal axis. Normal P wave. Normal QRS. Normal intervals. Normal ST and T waves. Impression: Low QRS voltage, otherwise normal ECG. When compared with ECG of 02/02/2016, no significant changes are seen..  Radiology Dg Chest 2 View  Result Date: 05/23/2016 CLINICAL DATA:  Shortness of breath, asthma attack congestion EXAM: CHEST  2 VIEW COMPARISON:  02/02/2016 FINDINGS: No acute infiltrate or  effusion. Borderline to mild cardiomegaly. Lobulated right diaphragmatic contour unchanged. No pneumothorax. Degenerative changes of the spine. IMPRESSION: 1. Borderline enlargement of the heart size 2. No acute infiltrate or edema Electronically Signed   By: Donavan Foil M.D.   On: 05/23/2016 01:53    Procedures Procedures (including critical care time)  Medications Ordered in ED Medications - No data to display   Initial Impression / Assessment and Plan / ED Course  Delora Fuel, MD has reviewed the triage vital signs and the nursing notes.  Pertinent labs & imaging results that were available during my care of the patient were reviewed by me and considered in my medical decision making (see chart for details).  Clinical Course   Asthma exacerbation. Chronic nasal congestion/rhinitis. Was given a dose of prednisone and is given albuterol with ipratropium nebulizer treatment. Review of old records confirms prior ED visits for asthma exacerbation.  Chest x-ray shows no acute change. He feels better following nebulizer treatment. He is discharged with prescription for prednisone. Will also give him a trial of Flonase nasal spray to see if it helps his chronic rhinitis.  Final Clinical Impressions(s) / ED Diagnoses   Final diagnoses:  Moderate persistent asthma with exacerbation  Chronic rhinitis, unspecified type    New Prescriptions New Prescriptions   FLUTICASONE (FLONASE) 50 MCG/ACT NASAL SPRAY    Place 2 sprays into both nostrils daily.   PREDNISONE (DELTASONE) 20 MG TABLET    2 tabs po daily x 4 days   I personally performed the services described in this documentation, which was scribed in my presence. The recorded information has been reviewed and is accurate.       Delora Fuel, MD [phone]

## 2016-05-23 NOTE — ED Triage Notes (Signed)
Pt arrived from home via EMS with c/o shortness of breath, pt reports having a runny nose and a dry cough recently. Per ems pt took his rescue inhaler and sudafed prior to ems arrival and had relief of SOB. Pt reports neck and back pain but denies any injury or trauma to either.

## 2016-06-12 ENCOUNTER — Ambulatory Visit (INDEPENDENT_AMBULATORY_CARE_PROVIDER_SITE_OTHER): Payer: BLUE CROSS/BLUE SHIELD | Admitting: Podiatry

## 2016-06-12 DIAGNOSIS — M722 Plantar fascial fibromatosis: Secondary | ICD-10-CM

## 2016-06-12 DIAGNOSIS — M79604 Pain in right leg: Secondary | ICD-10-CM

## 2016-06-12 DIAGNOSIS — Q828 Other specified congenital malformations of skin: Secondary | ICD-10-CM

## 2016-06-12 DIAGNOSIS — E0841 Diabetes mellitus due to underlying condition with diabetic mononeuropathy: Secondary | ICD-10-CM | POA: Diagnosis not present

## 2016-06-12 DIAGNOSIS — M79605 Pain in left leg: Secondary | ICD-10-CM

## 2016-06-12 DIAGNOSIS — B351 Tinea unguium: Secondary | ICD-10-CM | POA: Diagnosis not present

## 2016-06-13 ENCOUNTER — Telehealth (INDEPENDENT_AMBULATORY_CARE_PROVIDER_SITE_OTHER): Payer: Self-pay | Admitting: Orthopaedic Surgery

## 2016-06-13 NOTE — Telephone Encounter (Signed)
I called-we need to fax order for prosthetic gait training. He will be sent his prosthesis next week. Script faxed.

## 2016-06-13 NOTE — Telephone Encounter (Signed)
Juliann Pulse from [employer] called saying she's ready to deliver a prosthesis to Mr. Amlin but she needs a Rx for Physical Therapy before she can send it. Please call her regarding this if needed.  Kathy's ph# [phone] Kathy's fax# [phone]  Thank you.

## 2016-06-13 NOTE — Progress Notes (Signed)
Subjective:     Patient ID: Troy [name], male   DOB: Dec 16, 1965, 50 y.o.   MRN: [id number]  HPI patient presents for debridement nailbeds 1-5 right after having amputation left   Review of Systems     Objective:   Physical Exam Neurovascular status unchanged with incurvated thick nails 1-5 right that are impossible for patient to cut and are high risk    Assessment:     Mycotic nail infection 1-5 right foot with history of amputation left    Plan:     No indications of ulcerations or any other issues and debris did nails 1-5 right with no iatrogenic bleeding noted

## 2016-06-18 DIAGNOSIS — E0841 Diabetes mellitus due to underlying condition with diabetic mononeuropathy: Secondary | ICD-10-CM

## 2016-06-27 ENCOUNTER — Ambulatory Visit (INDEPENDENT_AMBULATORY_CARE_PROVIDER_SITE_OTHER): Payer: BLUE CROSS/BLUE SHIELD | Admitting: Orthopaedic Surgery

## 2016-06-27 ENCOUNTER — Encounter (INDEPENDENT_AMBULATORY_CARE_PROVIDER_SITE_OTHER): Payer: Self-pay | Admitting: Orthopaedic Surgery

## 2016-06-27 VITALS — Ht 70.0 in | Wt 260.0 lb

## 2016-06-27 DIAGNOSIS — Z89512 Acquired absence of left leg below knee: Secondary | ICD-10-CM

## 2016-06-27 NOTE — Progress Notes (Signed)
   Post-Op Visit Note   Patient: Troy [name]           Date of Birth: July 31, 1965           MRN: [id number] Visit Date: 06/27/2016 PCP: Kandice Hams, MD   Assessment & Plan:  Chief Complaint:  Chief Complaint  Patient presents with  . Left Leg - Routine Post Op   Visit Diagnoses:  1. Status post below knee amputation of left lower extremity ([place])     Plan: Patient has been delayed from the prosthetic fitting due to scab on the tibia. This was sharply debrided with the pickups and sterile scissors. Profore was applied. He can proceed with prosthetic fitting. No cellulitis there was 21 mm areas of slight bleeding after debridement of the large eschar. He can apply soap water lotion and then redress as needed till it's dry.  Follow-Up Instructions: Return if symptoms worsen or fail to improve.   Orders:  No orders of the defined types were placed in this encounter.  No orders of the defined types were placed in this encounter.  Patient returns for 2 month follow up. He is status post Left BKA on 02/06/16.  He is 20 weeks, 2 days out. He has not gotten prosthesis yet due to still having some scab on the stump. Patient denies any problems.   PMFS History: Patient Active Problem List   Diagnosis Date Noted  . Acute osteomyelitis of left foot ([place])   . Diabetic foot ([place]) 02/02/2016  . Osteomyelitis ([place]) 02/02/2016  . Osteomyelitis of foot, acute, left 02/02/2016  . Diabetic foot infection ([place]) 02/02/2016  . CKD (chronic kidney disease), stage II 02/02/2016  . Anemia 02/02/2016  . Kidney stones 11/30/2013  . Cholelithiasis: PER CT ABD/PELVIS AND ABD [place] 11/30/13 11/30/2013  . Acute renal failure ([place]) 11/29/2013  . Acute pancreatitis 11/29/2013  . Asthma exacerbation 11/09/2013  . CAP (community acquired pneumonia) 07/18/2013  . Diabetes ([place]) 07/18/2013  . Hypertension    Past Medical History:  Diagnosis Date  . Asthma   . Bronchitis   . CKD (chronic kidney disease)    . Diabetes mellitus   . Diabetic Charcot foot ([place])    Left  . Gout   . Hypertension   . Kidney stones 11/30/2013  . Neuromuscular disorder ([place])   . Obesity   . Polyneuropathy in diabetes [facility])     Family History  Problem Relation Age of Onset  . Diabetes Mellitus II Father   . Diabetes Father   . Hypertension Father   . Diabetes Mother   . Hypertension Mother   . CAD Neg Hx     Past Surgical History:  Procedure Laterality Date  . AMPUTATION Left 02/06/2016   Procedure: AMPUTATION BELOW KNEE;  Surgeon: Marybelle Killings, MD;  Location: [facility] [place];  Service: Orthopedics;  Laterality: Left;  . CHOLECYSTECTOMY N/A 12/03/2013   Procedure: LAPAROSCOPIC CHOLECYSTECTOMY WITH INTRAOPERATIVE CHOLANGIOGRAM;  Surgeon: Rolm Bookbinder, MD;  Location: [place];  Service: General;  Laterality: N/A;  . NO PAST SURGERIES     Social History   Occupational History  . Not on file.   Social History Main Topics  . Smoking status: Never Smoker  . Smokeless tobacco: Never Used  . Alcohol use Yes     Comment: occasionally  . Drug use: No  . Sexual activity: Not on file

## 2016-07-10 ENCOUNTER — Ambulatory Visit: Payer: BLUE CROSS/BLUE SHIELD | Admitting: Physical Therapy

## 2016-07-22 ENCOUNTER — Ambulatory Visit: Payer: BLUE CROSS/BLUE SHIELD | Attending: Orthopaedic Surgery | Admitting: Physical Therapy

## 2016-07-22 ENCOUNTER — Encounter: Payer: Self-pay | Admitting: Physical Therapy

## 2016-07-22 DIAGNOSIS — G8929 Other chronic pain: Secondary | ICD-10-CM | POA: Insufficient documentation

## 2016-07-22 DIAGNOSIS — R29898 Other symptoms and signs involving the musculoskeletal system: Secondary | ICD-10-CM | POA: Diagnosis present

## 2016-07-22 DIAGNOSIS — M545 Low back pain: Secondary | ICD-10-CM | POA: Insufficient documentation

## 2016-07-22 DIAGNOSIS — R2681 Unsteadiness on feet: Secondary | ICD-10-CM | POA: Diagnosis present

## 2016-07-22 DIAGNOSIS — R2689 Other abnormalities of gait and mobility: Secondary | ICD-10-CM | POA: Insufficient documentation

## 2016-07-22 DIAGNOSIS — M25562 Pain in left knee: Secondary | ICD-10-CM | POA: Diagnosis present

## 2016-07-22 DIAGNOSIS — M25561 Pain in right knee: Secondary | ICD-10-CM | POA: Diagnosis present

## 2016-07-23 ENCOUNTER — Ambulatory Visit: Payer: BLUE CROSS/BLUE SHIELD | Admitting: Physical Therapy

## 2016-07-23 NOTE — Therapy (Signed)
[facility] [address] [place], [place], [address] Phone: [phone]   Fax:  [phone]  Physical Therapy Evaluation  Patient Details  Name: Troy [name] MRN: [id number] Date of Birth: 1965/09/02 Referring Provider: Rodell Perna, MD  Encounter Date: 07/22/2016      PT End of Session - 07/22/16 1100    Visit Number 1   Number of Visits 20   Date for PT Re-Evaluation 09/26/16   Authorization Type [employer] 30 visit limit   Authorization - Number of Visits 30   PT Start Time 1010   PT Stop Time 1055   PT Time Calculation (min) 45 min   Equipment Utilized During Treatment Gait belt   Activity Tolerance Patient tolerated treatment well;Patient limited by pain   Behavior During Therapy [facility] for tasks assessed/performed      Past Medical History:  Diagnosis Date  . Asthma   . Bronchitis   . CKD (chronic kidney disease)   . Diabetes mellitus   . Diabetic Charcot foot ([place])    Left  . Gout   . Hypertension   . Kidney stones 11/30/2013  . Neuromuscular disorder ([place])   . Obesity   . Polyneuropathy in diabetes [facility])     Past Surgical History:  Procedure Laterality Date  . AMPUTATION Left 02/06/2016   Procedure: AMPUTATION BELOW KNEE;  Surgeon: Marybelle Killings, MD;  Location: [place];  Service: Orthopedics;  Laterality: Left;  . CHOLECYSTECTOMY N/A 12/03/2013   Procedure: LAPAROSCOPIC CHOLECYSTECTOMY WITH INTRAOPERATIVE CHOLANGIOGRAM;  Surgeon: Rolm Bookbinder, MD;  Location: [place];  Service: General;  Laterality: N/A;  . NO PAST SURGERIES      There were no vitals filed for this visit.       Subjective Assessment - 07/22/16 1017    Subjective This 50yo male had Charcot foot deformity with diabetic complications that became infected with osteomyelitis and he underwent a left Transtibial Amputation on 02/06/2016. He had difficulty healing his residual limb with wound still present. He recieved his first prosthesis on  07/08/2016 & is dependent in use & care. He presents to PT for evaluation carrying his prosthesis.     Patient is accompained by: Family member   Pertinent History HTN, DM2, CKD stage II, asthma, gout   Limitations Lifting;Standing;Walking   Patient Stated Goals He wants to use prosthesis to get back in community,    Currently in Pain? Yes   Pain Score 4   In last week, worst 8/10, best 0/10   Pain Location Knee   Pain Orientation Right;Left   Pain Descriptors / Indicators Aching;Sore   Pain Type Chronic pain   Pain Onset More than a month ago   Pain Frequency Intermittent   Aggravating Factors  standing & walking on one leg   Pain Relieving Factors meds & resting   Effect of Pain on Daily Activities limits standing   Multiple Pain Sites Yes   Pain Score 2  In last week, worst 5/10, best 0/10   Pain Location Back   Pain Orientation Lower;Mid;Right   Pain Descriptors / Indicators Aching;Spasm   Pain Type Chronic pain   Pain Onset More than a month ago   Pain Frequency Intermittent   Aggravating Factors  standing, walking, bending   Pain Relieving Factors meds & rest   Effect of Pain on Daily Activities limits standing             [facility] PT Assessment - 07/22/16 1015  Assessment   Medical Diagnosis Left Transtibial Amputation   Referring Provider Rodell Perna, MD   Onset Date/Surgical Date 07/08/16  Prosthesis delivery   Hand Dominance Right   Prior Therapy HHPT for pre-prosthetic     Precautions   Precautions Fall     Restrictions   Weight Bearing Restrictions No     Balance Screen   Has the patient fallen in the past 6 months Yes   How many times? 1  got off balance transfering, no injuries   Has the patient had a decrease in activity level because of a fear of falling?  Yes   Is the patient reluctant to leave their home because of a fear of falling?  Yes     [place] Private residence   Living Arrangements Spouse/significant  other;Children  18yo son & 28yo dtr   Type of [place] entrance;Stairs to enter  2nd entrance 3 steps & back 8 steps   Entrance Stairs-Number of Steps 3   Entrance Stairs-Rails Right;Left;Can reach both   [place] One level   Home Equipment Wheelchair - manual;Walker - 2 wheels;Crutches;Bedside commode;Tub bench     Prior Function   Level of Independence Independent;Independent with community mobility without device;Independent with gait   Vocation Other (comment)  applying for disability     Observation/Other Assessments   Focus on Therapeutic Outcomes (FOTO)  36.63 Functional Status   Activities of Balance Confidence Scale (ABC Scale)  49.4%   Fear Avoidance Belief Questionnaire (FABQ)  66 (18)     Posture/Postural Control   Posture/Postural Control Postural limitations   Postural Limitations Rounded Shoulders;Forward head;Increased lumbar lordosis;Flexed trunk;Weight shift right     ROM / Strength   AROM / PROM / Strength AROM;Strength     AROM   Overall AROM  Within functional limits for tasks performed   Overall AROM Comments hip flexors, hamstrings & right heelcord appear tight.      Strength   Overall Strength Deficits   Overall Strength Comments UEs WFL   Strength Assessment Site Hip;Knee;Ankle   Right/Left Hip Right;Left   Right Hip Flexion 5/5   Right Hip Extension 4/5  tested in standing   Right Hip ABduction 4/5  tested in standing   Left Hip Flexion 4/5   Left Hip Extension 3/5  tested in standing   Left Hip ABduction 3/5  tested in standing   Right/Left Knee Right;Left   Right Knee Flexion 4/5   Right Knee Extension 5/5   Left Knee Flexion 3/5   Left Knee Extension 4/5   Right/Left Ankle Right   Right Ankle Dorsiflexion 4/5   Right Ankle Plantar Flexion 3-/5   Right Ankle Inversion 3/5   Right Ankle Eversion 3/5     Transfers   Transfers Sit to Stand;Stand to Sit;Stand Pivot Transfers   Sit to Stand 5: Supervision;With  upper extremity assist;With armrests;From chair/3-in-1  requires UE support RW & back legs against chair stabilzatio   Stand to Sit 5: Supervision;With upper extremity assist;With armrests;To chair/3-in-1  requires UE support RW & back legs against chair stabilzatio   Stand Pivot Transfers 5: Supervision  requires UE support on RW & decreased prosthesis stance dura     Ambulation/Gait   Ambulation/Gait Yes   Ambulation/Gait Assistance 4: Min assist;5: Supervision  MinA turn & scanning & supervision straight path focused   Ambulation/Gait Assistance Details Excessive BUE weight bearing on RW & MinA for balance  loss in 180* turn   Ambulation Distance (Feet) 100 Feet   Assistive device Prosthesis;Rolling walker   Gait Pattern Step-to pattern;Decreased step length - right;Decreased stance time - left;Decreased stride length;Decreased hip/knee flexion - left;Decreased weight shift to left;Left hip hike;Left steppage;Left flexed knee in stance;Antalgic;Lateral hip instability;Trunk flexed;Abducted - left;Poor foot clearance - left   Ambulation Surface Indoor;Level   Gait velocity 1.59 ft/sec     Standardized Balance Assessment   Standardized Balance Assessment Berg Balance Test     Berg Balance Test   Sit to Stand Needs minimal aid to stand or to stabilize  with RW to stabilize supervision   Standing Unsupported Needs several tries to stand 30 seconds unsupported  RW support 2 min safely   Sitting with Back Unsupported but Feet Supported on Floor or Stool Able to sit safely and securely 2 minutes   Stand to Sit Uses backs of legs against chair to control descent  RW support   Transfers Able to transfer safely, definite need of hands   Standing Unsupported with Eyes Closed Needs help to keep from falling  RW able to stand 3 seconds   Standing Ubsupported with Feet Together Needs help to attain position and unable to hold for 15 seconds  RW support able to hold 20 sec with supervision    From Standing, Reach Forward with Outstretched Arm Loses balance while trying/requires external support  RW support reaches 4" with supervision   From Standing Position, Pick up Object from Floor Unable to try/needs assist to keep balance  RW reaches 4" from ground with supervision   From Standing Position, Turn to Look Behind Over each Shoulder Needs assist to keep from losing balance and falling  RW turns to sideways with supervision   Turn 360 Degrees Needs assistance while turning  RW support needs minA   Standing Unsupported, Alternately Place Feet on Step/Stool Needs assistance to keep from falling or unable to try  RW support needs minA   Standing Unsupported, One Foot in [employer] balance while stepping or standing  RW support takes small step & hold 30sec with supervision   Standing on One Leg Unable to try or needs assist to prevent fall  RW support RLE 6 sec & LLE 1 sec with close supervision   Total Score 11         Prosthetics Assessment - 07/22/16 [place] with Skin check;Residual limb care;Care of non-amputated limb;Prosthetic cleaning;Ply sock cleaning;Correct ply sock adjustment;Proper wear schedule/adjustment;Proper weight-bearing schedule/adjustment   Donning prosthesis  Supervision  constant 100% verbal cues   Doffing prosthesis  Supervision   Current prosthetic wear tolerance (days/week)  reports 6 of 14 days since delivery   Current prosthetic wear tolerance (#hours/day)  reports up to 20 min 1x/day    Current prosthetic weight-bearing tolerance (hours/day)  tolerated standing 5 min with RW support see subjective for pain issues in knees & back. Reports no residual limb pain.    Edema pitting edema   Residual limb condition  wound on scar 68mm X 23mm with yellow scab appears superficial. Minimal hair growth. Bulbous shape, Normal color & temperature.                   [place] Adult PT Treatment/Exercise -  07/22/16 1015      Prosthetics   Prosthetic Care Comments  PT instructed to initiate daily wear 2hrs 2x/day with plan to increase weekly with no  issues. PT instructed limited standing & only with support like RW or counter.     Education Provided Skin check;Residual limb care;Prosthetic cleaning;Correct ply sock adjustment;Proper Donning;Proper Doffing;Proper wear schedule/adjustment;Proper weight-bearing schedule/adjustment;Other (comment)  see prosthetic care   Person(s) Educated Patient   Education Method Explanation;Demonstration;Tactile cues;Verbal cues   Education Method Verbalized understanding;Returned demonstration;Tactile cues required;Verbal cues required;Needs further instruction                  PT Short Term Goals - 07/22/16 1344      PT SHORT TERM GOAL #1   Title Patient tolerates wear of prosthesis >/= 8 hrs total without wounds increasing in size and no residual limb pain. (Target Date: 08/22/2016)   Time 5   Period Weeks   Status New     PT SHORT TERM GOAL #2   Title Patient demonstrates proper donning & verbalizes proper cleaning of prosthesis. (Target Date: 08/22/2016)   Time 5   Period Weeks   Status New     PT SHORT TERM GOAL #3   Title Patient stands without UE support 1 minute and reaches 5" anteriorly with supervision. (Target Date: 08/22/2016)   Time 5   Period Weeks   Status New     PT SHORT TERM GOAL #4   Title Patient ambulates 200' including around obstacles & scanning environment with RW & prosthesis safely with cues only for deviations. (Target Date: 08/22/2016)   Time 5   Period Weeks   Status New     PT SHORT TERM GOAL #5   Title Patient negotiates ramps & curbs with RW & stairs with 2 rails with prosthesis with supervision. (Target Date: 08/22/2016)   Time 5   Period Weeks   Status New     Additional Short Term Goals   Additional Short Term Goals Yes     PT SHORT TERM GOAL #6   Title Patient tolerates standing & gait activities  for 5 minutes with chronic pain in knees & back increasing </= 4 increments on 0-10 scale. (Target Date: 08/22/2016)   Time 5   Period Weeks   Status New           PT Long Term Goals - 07/22/16 1840      PT LONG TERM GOAL #1   Title Patient tolerates wear of prosthesis >90% of awake hours without skin issues or residual limb pain to enable function during his day. (Target Date: 09/26/2016)   Time 10   Period Weeks   Status New     PT LONG TERM GOAL #2   Title Patient demonstrates & verbalizes proper prosthetic care to enable safe use of prosthesis for function. (Target Date: 09/26/2016)   Time 10   Period Weeks   Status New     PT LONG TERM GOAL #3   Title Patient reports pain in knees & back increases </= 2 increments on 0-10 scale with standing & gait activities. (Target Date: 09/26/2016)   Time 10   Period Weeks   Status New     PT LONG TERM GOAL #4   Title Berg Balance > 36/56 to indicate lower fall risk. (Target Date: 09/26/2016)   Time 10   Period Weeks   Status New     PT LONG TERM GOAL #5   Title Patient ambulates >400' outdoors with prosthesis & LRAD modified independent for community mobility. (Target Date: 09/26/2016)   Time 10   Period Weeks   Status New  Additional Long Term Goals   Additional Long Term Goals Yes     PT LONG TERM GOAL #6   Title Patient negotiates ramps, curbs & stairs with prosthesis & LRAD modified independent to enable community access. (Target Date: 09/26/2016)   Time 10   Period Weeks   Status New               Plan - 07/22/16 1828    Clinical Impression Statement This 50yo male had a left Transtibial Amputation on 02/06/2016 and slow healing so he recieved his first prosthesis on 07/08/2016. He is dependent in care & use limiting safe progression of use. He has wound still present but with skilled guidance should be able to slowly progress wear. He is only wearing prosthesis 6 of 14 days since delivery for up to 20 minutes which  limits function during his day. His balance is impaired requiring UE support for safety & Berg Balance of 11/56 indicates high fall risk & dependency in ADLs. His gait is dependent on BUE support & supervision for straight pathes when focused on gait. He requires minA with turning & scanning environment with RW support. Patient would benefit from skilled care to progress safe use of prosthesis at community level. Patient's condition  is unstable with wound & requires high complexity to plan of care.    Rehab Potential Good   PT Frequency 2x / week   PT Duration Other (comment)  10 weeks   PT Treatment/Interventions ADLs/Self Care Home Management;DME Instruction;Gait training;Stair training;Functional mobility training;Therapeutic activities;Therapeutic exercise;Balance training;Neuromuscular re-education;Patient/family education;Prosthetic Training   PT Next Visit Plan Review prosthetic care, Prosthetic gait with RW including barriers, HEP at sink for midline & balance   Consulted and Agree with Plan of Care Patient      Patient will benefit from skilled therapeutic intervention in order to improve the following deficits and impairments:  Abnormal gait, Decreased activity tolerance, Decreased balance, Decreased endurance, Decreased knowledge of precautions, Decreased knowledge of use of DME, Decreased mobility, Decreased strength, Impaired flexibility, Postural dysfunction, Prosthetic Dependency  Visit Diagnosis: Other abnormalities of gait and mobility  Chronic pain of left knee  Chronic pain of right knee  Chronic midline low back pain, with sciatica presence unspecified  Unsteadiness on feet  Other symptoms and signs involving the musculoskeletal system     Problem List Patient Active Problem List   Diagnosis Date Noted  . Acute osteomyelitis of left foot ([place])   . Diabetic foot ([place]) 02/02/2016  . Osteomyelitis ([place]) 02/02/2016  . Osteomyelitis of foot, acute, left 02/02/2016   . Diabetic foot infection ([place]) 02/02/2016  . CKD (chronic kidney disease), stage II 02/02/2016  . Anemia 02/02/2016  . Kidney stones 11/30/2013  . Cholelithiasis: PER CT ABD/PELVIS AND ABD [place] 11/30/13 11/30/2013  . Acute renal failure ([place]) 11/29/2013  . Acute pancreatitis 11/29/2013  . Asthma exacerbation 11/09/2013  . CAP (community acquired pneumonia) 07/18/2013  . Diabetes ([place]) 07/18/2013  . Hypertension     Avyn Aden PT, DPT 07/23/2016, 1:52 PM  [facility] [address] [place], [place], [address] Phone: [phone]   Fax:  [phone]  Name: Colt Zysk MRN: [id number] Date of Birth: 1966-03-10

## 2016-07-30 ENCOUNTER — Ambulatory Visit: Payer: BLUE CROSS/BLUE SHIELD | Attending: Orthopaedic Surgery | Admitting: Physical Therapy

## 2016-07-30 ENCOUNTER — Encounter: Payer: Self-pay | Admitting: Physical Therapy

## 2016-07-30 DIAGNOSIS — M545 Low back pain: Secondary | ICD-10-CM | POA: Insufficient documentation

## 2016-07-30 DIAGNOSIS — R29898 Other symptoms and signs involving the musculoskeletal system: Secondary | ICD-10-CM | POA: Insufficient documentation

## 2016-07-30 DIAGNOSIS — R2689 Other abnormalities of gait and mobility: Secondary | ICD-10-CM

## 2016-07-30 DIAGNOSIS — M25561 Pain in right knee: Secondary | ICD-10-CM | POA: Insufficient documentation

## 2016-07-30 DIAGNOSIS — G8929 Other chronic pain: Secondary | ICD-10-CM | POA: Diagnosis present

## 2016-07-30 DIAGNOSIS — R2681 Unsteadiness on feet: Secondary | ICD-10-CM | POA: Diagnosis present

## 2016-07-30 DIAGNOSIS — M25562 Pain in left knee: Secondary | ICD-10-CM | POA: Diagnosis present

## 2016-07-31 ENCOUNTER — Ambulatory Visit: Payer: BLUE CROSS/BLUE SHIELD | Admitting: Physical Therapy

## 2016-07-31 DIAGNOSIS — R2689 Other abnormalities of gait and mobility: Secondary | ICD-10-CM | POA: Diagnosis not present

## 2016-07-31 DIAGNOSIS — R29898 Other symptoms and signs involving the musculoskeletal system: Secondary | ICD-10-CM

## 2016-07-31 DIAGNOSIS — M25561 Pain in right knee: Secondary | ICD-10-CM

## 2016-07-31 DIAGNOSIS — M545 Low back pain: Secondary | ICD-10-CM

## 2016-07-31 DIAGNOSIS — M25562 Pain in left knee: Secondary | ICD-10-CM

## 2016-07-31 DIAGNOSIS — R2681 Unsteadiness on feet: Secondary | ICD-10-CM

## 2016-07-31 DIAGNOSIS — G8929 Other chronic pain: Secondary | ICD-10-CM

## 2016-07-31 NOTE — Therapy (Signed)
[facility] [address] [place], [place], [address] Phone: [phone]   Fax:  [phone]  Physical Therapy Treatment  Patient Details  Name: Troy [name] MRN: [id number] Date of Birth: 08/31/1965 Referring Provider: Rodell Perna, MD  Encounter Date: 07/30/2016      PT End of Session - 07/30/16 1626    Visit Number 2   Number of Visits 20   Date for PT Re-Evaluation 09/26/16   Authorization Type [employer] 30 visit limit   Authorization - Visit Number 2   Authorization - Number of Visits 30   PT Start Time [id number]   PT Stop Time 1617   PT Time Calculation (min) 47 min   Equipment Utilized During Treatment Gait belt   Activity Tolerance Patient tolerated treatment well   Behavior During Therapy WFL for tasks assessed/performed      Past Medical History:  Diagnosis Date  . Asthma   . Bronchitis   . CKD (chronic kidney disease)   . Diabetes mellitus   . Diabetic Charcot foot ([place])    Left  . Gout   . Hypertension   . Kidney stones 11/30/2013  . Neuromuscular disorder ([place])   . Obesity   . Polyneuropathy in diabetes [facility])     Past Surgical History:  Procedure Laterality Date  . AMPUTATION Left 02/06/2016   Procedure: AMPUTATION BELOW KNEE;  Surgeon: Marybelle Killings, MD;  Location: [facility];  Service: Orthopedics;  Laterality: Left;  . CHOLECYSTECTOMY N/A 12/03/2013   Procedure: LAPAROSCOPIC CHOLECYSTECTOMY WITH INTRAOPERATIVE CHOLANGIOGRAM;  Surgeon: Rolm Bookbinder, MD;  Location: [facility];  Service: General;  Laterality: N/A;  . NO PAST SURGERIES      There were no vitals filed for this visit.      Subjective Assessment - 07/30/16 1539    Subjective He has been wearing prosthesis 2 hrs 2xday every day but one since evaluation 8 days ago.      Patient is accompained by: Family member   Pertinent History HTN, DM2, CKD stage II, asthma, gout   Limitations Lifting;Standing;Walking   Patient Stated Goals He wants to  use prosthesis to get back in community,    Currently in Pain? No/denies                         [facility] Adult PT Treatment/Exercise - 07/30/16 1530      Transfers   Transfers Sit to Stand;Stand to Sit   Sit to Stand 5: Supervision;With upper extremity assist;With armrests;From chair/3-in-1  from w/c, chairs with & without armrests.   Sit to Stand Details (indicate cue type and reason) Verbal & tactile cues on proper technique, worked on not touching counter to stabilize; cues on chairs without armrests requires UE support to stabilize   Stand to Sit 5: Supervision;With upper extremity assist;With armrests;To chair/3-in-1  chairs with & without armrests   Stand to Sit Details verbal & demo cues on proper technique   Stand Pivot Transfers 5: Supervision  with BUE support   Stand Pivot Transfer Details (indicate cue type and reason) demo & verbal cues quarter turns clockwise & counterclockwise.     Ambulation/Gait   Ambulation/Gait Yes   Ambulation/Gait Assistance 4: Min assist;5: Supervision  MinA turns 180*   Ambulation/Gait Assistance Details demo & verbal cues on step length for equal with step thru pattern. Cues on upright posture   Ambulation Distance (Feet) 100 Feet  100' X 2   Assistive device  Prosthesis;Rolling walker   Ambulation Surface Indoor;Level   Stairs Yes   Stairs Assistance 5: Supervision   Stairs Assistance Details (indicate cue type and reason) demo & verbal cues on technique with TTA prosthesis   Stair Management Technique Two rails;Step to pattern;Forwards   Number of Stairs 4   Ramp 4: Min assist  RW & prosthesis   Ramp Details (indicate cue type and reason) demo & verbal cues on proper technique with prosthesis including wt shift & posture   Curb 4: Min assist  Min gaurd with RW & prosthesis   Curb Details (indicate cue type and reason) verbal cues on proper technique     Prosthetics   Prosthetic Care Comments  instructed in adjusting ply  socks with 40' gait with RW w/ too few, too many & correct ply fit.  PT instructed to increase wear to 3hrs 2x/day with continued Tegaderm for wound.    Current prosthetic wear tolerance (days/week)  daily 7 of last 8 days.   Current prosthetic wear tolerance (#hours/day)  2 hrs 2x/day   Edema pitting edema with distal hard tissue   Residual limb condition  wound on scar 61mm X 71mm with red granulated tissue appears superficial.    Education Provided Skin check;Residual limb care;Correct ply sock adjustment;Proper Donning;Proper wear schedule/adjustment;Other (comment)  see prosthetic care   Person(s) Educated Patient   Education Method Explanation;Demonstration;Tactile cues;Verbal cues   Education Method Verbalized understanding;Returned demonstration;Tactile cues required;Verbal cues required;Needs further instruction                PT Education - 07/30/16 1530    Education provided Yes   Education Details at counter: turning quarter turns for 360* clockwise & counterclockwise, lateral step & reach upward, level & downward to right & left and sit to/from stand with goal to not touch counter to [employer]) Educated Patient;Spouse   Methods Explanation;Demonstration;Tactile cues;Verbal cues   Comprehension Verbalized understanding;Returned demonstration;Verbal cues required;Tactile cues required;Need further instruction          PT Short Term Goals - 07/22/16 1344      PT SHORT TERM GOAL #1   Title Patient tolerates wear of prosthesis >/= 8 hrs total without wounds increasing in size and no residual limb pain. (Target Date: 08/22/2016)   Time 5   Period Weeks   Status New     PT SHORT TERM GOAL #2   Title Patient demonstrates proper donning & verbalizes proper cleaning of prosthesis. (Target Date: 08/22/2016)   Time 5   Period Weeks   Status New     PT SHORT TERM GOAL #3   Title Patient stands without UE support 1 minute and reaches 5" anteriorly with  supervision. (Target Date: 08/22/2016)   Time 5   Period Weeks   Status New     PT SHORT TERM GOAL #4   Title Patient ambulates 200' including around obstacles & scanning environment with RW & prosthesis safely with cues only for deviations. (Target Date: 08/22/2016)   Time 5   Period Weeks   Status New     PT SHORT TERM GOAL #5   Title Patient negotiates ramps & curbs with RW & stairs with 2 rails with prosthesis with supervision. (Target Date: 08/22/2016)   Time 5   Period Weeks   Status New     Additional Short Term Goals   Additional Short Term Goals Yes     PT SHORT TERM GOAL #6   Title Patient tolerates  standing & gait activities for 5 minutes with chronic pain in knees & back increasing </= 4 increments on 0-10 scale. (Target Date: 08/22/2016)   Time 5   Period Weeks   Status New           PT Long Term Goals - 07/22/16 1840      PT LONG TERM GOAL #1   Title Patient tolerates wear of prosthesis >90% of awake hours without skin issues or residual limb pain to enable function during his day. (Target Date: 09/26/2016)   Time 10   Period Weeks   Status New     PT LONG TERM GOAL #2   Title Patient demonstrates & verbalizes proper prosthetic care to enable safe use of prosthesis for function. (Target Date: 09/26/2016)   Time 10   Period Weeks   Status New     PT LONG TERM GOAL #3   Title Patient reports pain in knees & back increases </= 2 increments on 0-10 scale with standing & gait activities. (Target Date: 09/26/2016)   Time 10   Period Weeks   Status New     PT LONG TERM GOAL #4   Title Berg Balance > 36/56 to indicate lower fall risk. (Target Date: 09/26/2016)   Time 10   Period Weeks   Status New     PT LONG TERM GOAL #5   Title Patient ambulates >400' outdoors with prosthesis & LRAD modified independent for community mobility. (Target Date: 09/26/2016)   Time 10   Period Weeks   Status New     Additional Long Term Goals   Additional Long Term Goals Yes      PT LONG TERM GOAL #6   Title Patient negotiates ramps, curbs & stairs with prosthesis & LRAD modified independent to enable community access. (Target Date: 09/26/2016)   Time 10   Period Weeks   Status New               Plan - 07/30/16 1632    Clinical Impression Statement Patient appears to understand adjusting ply socks better but needs additional instruction. Pt's wound appears to be tolerating prosthesis wear without negative changes to wound. He appears to understand basic technique to negotiate ramps, curbs & stairs with prosthesis.    Rehab Potential Good   PT Frequency 2x / week   PT Duration Other (comment)  10 weeks   PT Treatment/Interventions ADLs/Self Care Home Management;DME Instruction;Gait training;Stair training;Functional mobility training;Therapeutic activities;Therapeutic exercise;Balance training;Neuromuscular re-education;Patient/family education;Prosthetic Training   PT Next Visit Plan Review prosthetic care, Prosthetic gait with RW including barriers, update HEP at sink for midline & balance   Consulted and Agree with Plan of Care Patient      Patient will benefit from skilled therapeutic intervention in order to improve the following deficits and impairments:  Abnormal gait, Decreased activity tolerance, Decreased balance, Decreased endurance, Decreased knowledge of precautions, Decreased knowledge of use of DME, Decreased mobility, Decreased strength, Impaired flexibility, Postural dysfunction, Prosthetic Dependency  Visit Diagnosis: Other abnormalities of gait and mobility  Unsteadiness on feet  Other symptoms and signs involving the musculoskeletal system     Problem List Patient Active Problem List   Diagnosis Date Noted  . Acute osteomyelitis of left foot ([place])   . Diabetic foot ([place]) 02/02/2016  . Osteomyelitis ([place]) 02/02/2016  . Osteomyelitis of foot, acute, left 02/02/2016  . Diabetic foot infection ([place]) 02/02/2016  . CKD (chronic kidney  disease), stage II 02/02/2016  . Anemia 02/02/2016  .  Kidney stones 11/30/2013  . Cholelithiasis: PER CT ABD/PELVIS AND ABD [place] 11/30/13 11/30/2013  . Acute renal failure ([place]) 11/29/2013  . Acute pancreatitis 11/29/2013  . Asthma exacerbation 11/09/2013  . CAP (community acquired pneumonia) 07/18/2013  . Diabetes ([place]) 07/18/2013  . Hypertension     Chaden Doom PT, DPT 07/31/2016, 6:36 AM  [facility] [address] [place], [place], [address] Phone: [phone]   Fax:  [phone]  Name: Troy [name] MRN: [id number] Date of Birth: 26-Jul-1966

## 2016-08-04 ENCOUNTER — Other Ambulatory Visit: Payer: Self-pay | Admitting: Internal Medicine

## 2016-08-04 ENCOUNTER — Ambulatory Visit: Payer: BLUE CROSS/BLUE SHIELD | Admitting: Physical Therapy

## 2016-08-04 DIAGNOSIS — L03115 Cellulitis of right lower limb: Secondary | ICD-10-CM

## 2016-08-04 NOTE — Therapy (Signed)
[facility] [address] [place], [place], [address] Phone: [phone]   Fax:  [phone]  Physical Therapy Treatment  Patient Details  Name: Troy [name] MRN: [id number] Date of Birth: 12-Jul-1966 Referring Provider: Rodell Perna, MD  Encounter Date: 07/31/2016      07/31/16 1610  PT Visits / Re-Eval  Visit Number 3  Number of Visits 20  Date for PT Re-Evaluation 09/26/16  Authorization  Authorization Type [employer] 30 visit limit  Authorization - Visit Number 3  Authorization - Number of Visits 30  PT Time Calculation  PT Start Time [id number]  PT Stop Time 1530  PT Time Calculation (min) 45 min  PT - End of Session  Equipment Utilized During Treatment Gait belt  Activity Tolerance Patient tolerated treatment well  Behavior During Therapy [facility] for tasks assessed/performed     Past Medical History:  Diagnosis Date  . Asthma   . Bronchitis   . CKD (chronic kidney disease)   . Diabetes mellitus   . Diabetic Charcot foot ([place])    Left  . Gout   . Hypertension   . Kidney stones 11/30/2013  . Neuromuscular disorder ([place])   . Obesity   . Polyneuropathy in diabetes [facility])     Past Surgical History:  Procedure Laterality Date  . AMPUTATION Left 02/06/2016   Procedure: AMPUTATION BELOW KNEE;  Surgeon: Marybelle Killings, MD;  Location: [facility];  Service: Orthopedics;  Laterality: Left;  . CHOLECYSTECTOMY N/A 12/03/2013   Procedure: LAPAROSCOPIC CHOLECYSTECTOMY WITH INTRAOPERATIVE CHOLANGIOGRAM;  Surgeon: Rolm Bookbinder, MD;  Location: [facility];  Service: General;  Laterality: N/A;  . NO PAST SURGERIES      There were no vitals filed for this visit.     07/31/16 1445  Symptoms/Limitations  Subjective He did not have any increases in chronic pain with session yesterday.   Patient is accompained by: Family member  Pertinent History HTN, DM2, CKD stage II, asthma, gout  Limitations Lifting;Standing;Walking  Patient Stated  Goals He wants to use prosthesis to get back in community,   Pain Assessment  Currently in Pain? No/denies      07/31/16 1445  Transfers  Transfers Sit to Stand;Stand to Sit  Sit to Stand 6: Modified independent (Device/Increase time);5: Supervision;With upper extremity assist;With armrests;From chair/3-in-1 (Mod. Ind with RW to stabilize upon arising, SBA no UE suppor)  Sit to Stand Details (indicate cue type and reason) demo & verbal cues on technique with chairs without armrests & simulated couch with one armrests  Stand to Sit 6: Modified independent (Device/Increase time);5: Supervision;With upper extremity assist;With armrests;To chair/3-in-1 (Mod. Ind with RW to stabilize upon arising, SBA no UE suppor)  Stand to Sit Details demo & verbal cues on technique with chairs without armrests & simulated couch with one armrests  Stand Pivot Transfers 5: Supervision (with BUE support of RW turning 180* & 90*)  Stand Pivot Transfer Details (indicate cue type and reason) tactile & verbal cues on balance   Ambulation/Gait  Ambulation/Gait Yes  Ambulation/Gait Assistance 5: Supervision  Ambulation/Gait Assistance Details verbal & tactile cues on posture & step length. Worked on negotiating obstacles & turning.   Ambulation Distance (Feet) 170 Feet (170' X 2 & 43' X 4)  Assistive device Prosthesis;Rolling walker  Ambulation Surface Indoor;Level  Stairs Yes  Stairs Assistance 5: Supervision  Stairs Assistance Details (indicate cue type and reason) verbal cues on wt shift.   Stair Management Technique Two rails;Step to pattern;Forwards  Number of  Stairs 4  Ramp 4: Min assist (RW & prosthesis)  Ramp Details (indicate cue type and reason) verbal & tactile cues on technique  Curb 4: Min assist (Min gaurd with RW & prosthesis)  Curb Details (indicate cue type and reason) Min gaurd; verbal cues on technique  Prosthetics  Prosthetic Care Comments  Reviewed adjusting ply socks. Increase wear to  3hrs 2x/day. Continue use of Tegaderm.   Current prosthetic wear tolerance (days/week)  daily  Current prosthetic wear tolerance (#hours/day)  3 hrs 2x/day  Edema pitting edema with distal hard tissue  Residual limb condition  wound on scar 57mm X 57mm with red granulated tissue appears superficial.   Education Provided Skin check;Residual limb care;Correct ply sock adjustment;Proper Donning;Proper wear schedule/adjustment;Other (comment) (see prosthetic care)  Person(s) Educated Patient  Education Method Explanation;Demonstration;Tactile cues;Verbal cues  Education Method Verbalized understanding;Returned demonstration;Tactile cues required;Verbal cues required;Needs further instruction                          07/31/16 1612  Plan  Clinical Impression Statement Patient improved gait with RW including turns around obstacles and 180*. He appears safe to ambulate with RW short distances similar to his home.   Pt will benefit from skilled therapeutic intervention in order to improve on the following deficits Abnormal gait;Decreased activity tolerance;Decreased balance;Decreased endurance;Decreased knowledge of precautions;Decreased knowledge of use of DME;Decreased mobility;Decreased strength;Impaired flexibility;Postural dysfunction;Prosthetic Dependency  Rehab Potential Good  PT Frequency 2x / week  PT Duration Other (comment) (10 weeks)  PT Treatment/Interventions ADLs/Self Care Home Management;DME Instruction;Gait training;Stair training;Functional mobility training;Therapeutic activities;Therapeutic exercise;Balance training;Neuromuscular re-education;Patient/family education;Prosthetic Training  PT Next Visit Plan Review prosthetic care, Prosthetic gait with RW including barriers, update HEP at sink for midline & balance  Consulted and Agree with Plan of Care Patient         PT Short Term Goals - 07/31/16 1607      PT SHORT TERM GOAL #1   Title Patient tolerates  wear of prosthesis >/= 8 hrs total without wounds increasing in size and no residual limb pain. (Target Date: 08/22/2016)   Time 5   Period Weeks   Status On-going     PT SHORT TERM GOAL #2   Title Patient demonstrates proper donning & verbalizes proper cleaning of prosthesis. (Target Date: 08/22/2016)   Time 5   Period Weeks   Status On-going     PT SHORT TERM GOAL #3   Title Patient stands without UE support 1 minute and reaches 5" anteriorly with supervision. (Target Date: 08/22/2016)   Time 5   Period Weeks   Status On-going     PT SHORT TERM GOAL #4   Title Patient ambulates 200' including around obstacles & scanning environment with RW & prosthesis safely with cues only for deviations. (Target Date: 08/22/2016)   Time 5   Period Weeks   Status On-going     PT SHORT TERM GOAL #5   Title Patient negotiates ramps & curbs with RW & stairs with 2 rails with prosthesis with supervision. (Target Date: 08/22/2016)   Time 5   Period Weeks   Status On-going     PT SHORT TERM GOAL #6   Title Patient tolerates standing & gait activities for 5 minutes with chronic pain in knees & back increasing </= 4 increments on 0-10 scale. (Target Date: 08/22/2016)   Time 5   Period Weeks   Status On-going  PT Long Term Goals - 07/31/16 1608      PT LONG TERM GOAL #1   Title Patient tolerates wear of prosthesis >90% of awake hours without skin issues or residual limb pain to enable function during his day. (Target Date: 09/26/2016)   Time 10   Period Weeks   Status On-going     PT LONG TERM GOAL #2   Title Patient demonstrates & verbalizes proper prosthetic care to enable safe use of prosthesis for function. (Target Date: 09/26/2016)   Time 10   Period Weeks   Status On-going     PT LONG TERM GOAL #3   Title Patient reports pain in knees & back increases </= 2 increments on 0-10 scale with standing & gait activities. (Target Date: 09/26/2016)   Time 10   Period Weeks   Status  On-going     PT LONG TERM GOAL #4   Title Berg Balance > 36/56 to indicate lower fall risk. (Target Date: 09/26/2016)   Time 10   Period Weeks   Status On-going     PT LONG TERM GOAL #5   Title Patient ambulates >400' outdoors with prosthesis & LRAD modified independent for community mobility. (Target Date: 09/26/2016)   Time 10   Period Weeks   Status On-going     PT LONG TERM GOAL #6   Title Patient negotiates ramps, curbs & stairs with prosthesis & LRAD modified independent to enable community access. (Target Date: 09/26/2016)   Time 10   Period Weeks   Status On-going             Patient will benefit from skilled therapeutic intervention in order to improve the following deficits and impairments:     Visit Diagnosis: Other abnormalities of gait and mobility  Unsteadiness on feet  Other symptoms and signs involving the musculoskeletal system  Chronic pain of left knee  Chronic pain of right knee  Chronic midline low back pain, with sciatica presence unspecified     Problem List Patient Active Problem List   Diagnosis Date Noted  . Acute osteomyelitis of left foot ([place])   . Diabetic foot ([place]) 02/02/2016  . Osteomyelitis ([place]) 02/02/2016  . Osteomyelitis of foot, acute, left 02/02/2016  . Diabetic foot infection ([place]) 02/02/2016  . CKD (chronic kidney disease), stage II 02/02/2016  . Anemia 02/02/2016  . Kidney stones 11/30/2013  . Cholelithiasis: PER CT ABD/PELVIS AND ABD [place] 11/30/13 11/30/2013  . Acute renal failure ([place]) 11/29/2013  . Acute pancreatitis 11/29/2013  . Asthma exacerbation 11/09/2013  . CAP (community acquired pneumonia) 07/18/2013  . Diabetes ([place]) 07/18/2013  . Hypertension     Taneika Choi PT, DPT 08/04/2016, 6:09 AM  [facility] [address] [place], [place], [address] Phone: [phone]   Fax:  [phone]  Name: Troy [name] MRN: [id number] Date of Birth:  1966-04-16

## 2016-08-05 ENCOUNTER — Ambulatory Visit
Admission: RE | Admit: 2016-08-05 | Discharge: 2016-08-05 | Disposition: A | Discharge status: Home / Self Care | Payer: BLUE CROSS/BLUE SHIELD | Source: Ambulatory Visit | Attending: Internal Medicine | Admitting: Internal Medicine

## 2016-08-05 DIAGNOSIS — L03115 Cellulitis of right lower limb: Secondary | ICD-10-CM

## 2016-08-05 MED ORDER — GADOBENATE DIMEGLUMINE 529 MG/ML IV SOLN
20.0000 mL | Freq: Once | INTRAVENOUS | Status: AC | PRN
  Administered 2016-08-05: 20 mL via INTRAVENOUS

## 2016-08-06 ENCOUNTER — Ambulatory Visit: Payer: BLUE CROSS/BLUE SHIELD | Admitting: Physical Therapy

## 2016-08-11 ENCOUNTER — Ambulatory Visit: Payer: BLUE CROSS/BLUE SHIELD | Admitting: Physical Therapy

## 2016-08-13 ENCOUNTER — Ambulatory Visit: Payer: BLUE CROSS/BLUE SHIELD | Admitting: Physical Therapy

## 2016-08-15 ENCOUNTER — Ambulatory Visit: Payer: BLUE CROSS/BLUE SHIELD | Admitting: Rehabilitation

## 2016-08-15 DIAGNOSIS — R2689 Other abnormalities of gait and mobility: Secondary | ICD-10-CM | POA: Diagnosis not present

## 2016-08-15 DIAGNOSIS — R2681 Unsteadiness on feet: Secondary | ICD-10-CM

## 2016-08-15 DIAGNOSIS — R29898 Other symptoms and signs involving the musculoskeletal system: Secondary | ICD-10-CM

## 2016-08-15 NOTE — Therapy (Signed)
[facility] [address] [place], [place], [address] Phone: [phone]   Fax:  [phone]  Physical Therapy Treatment  Patient Details  Name: Troy [name] MRN: [id number] Date of Birth: [birth year] Referring Provider: Rodell Perna, MD  Encounter Date: 08/15/2016      PT End of Session - 08/15/16 1410    Visit Number 4   Number of Visits 20   Date for PT Re-Evaluation 09/26/16   Authorization Type BCBS 30 visit limit   Authorization - Visit Number 4   Authorization - Number of Visits 30   PT Start Time [id number]   PT Stop Time 1447   PT Time Calculation (min) 45 min   Equipment Utilized During Treatment Gait belt   Activity Tolerance Patient tolerated treatment well   Behavior During Therapy WFL for tasks assessed/performed      Past Medical History:  Diagnosis Date  . Asthma   . Bronchitis   . CKD (chronic kidney disease)   . Diabetes mellitus   . Diabetic Charcot foot ([place])    Left  . Gout   . Hypertension   . Kidney stones 11/30/2013  . Neuromuscular disorder ([place])   . Obesity   . Polyneuropathy in diabetes [facility])     Past Surgical History:  Procedure Laterality Date  . AMPUTATION Left 02/06/2016   Procedure: AMPUTATION BELOW KNEE;  Surgeon: Marybelle Killings, MD;  Location: [facility];  Service: Orthopedics;  Laterality: Left;  . CHOLECYSTECTOMY N/A 12/03/2013   Procedure: LAPAROSCOPIC CHOLECYSTECTOMY WITH INTRAOPERATIVE CHOLANGIOGRAM;  Surgeon: Rolm Bookbinder, MD;  Location: [facility];  Service: General;  Laterality: N/A;  . NO PAST SURGERIES      There were no vitals filed for this visit.      Subjective Assessment - 08/15/16 1408    Subjective Reports missing last week due to staph infection in R leg, finished antibiotics.     Patient is accompained by: Family member   Pertinent History HTN, DM2, CKD stage II, asthma, gout   Limitations Lifting;Standing;Walking   Patient Stated Goals He wants to use  prosthesis to get back in community,    Currently in Pain? Yes   Pain Score 3    Pain Location Back   Pain Orientation Lower   Pain Descriptors / Indicators Aching   Pain Type Chronic pain   Pain Onset More than a month ago   Pain Frequency Intermittent   Aggravating Factors  standing, walking, sitting too long   Pain Relieving Factors once taking a shower   Multiple Pain Sites Yes   Pain Score --  leg hurts on when bumping                         [facility] Adult PT Treatment/Exercise - 08/15/16 1410      Transfers   Transfers Sit to Stand;Stand to Sit   Sit to Stand 6: Modified independent (Device/Increase time);5: Supervision;With upper extremity assist;With armrests;From chair/3-in-1   Stand to Sit 6: Modified independent (Device/Increase time);5: Supervision;With upper extremity assist;With armrests;To chair/3-in-1     Ambulation/Gait   Ambulation/Gait Yes   Ambulation/Gait Assistance 5: Supervision;4: Min guard;4: Min assist   Ambulation/Gait Assistance Details Pt ambulates with RW at S level, including making turns.  Min cues for upright posture and forward gaze.  During session due to pt making good progress with RW, initiated use of quad tip cane.  Performed 79' with RW and 230' with quad  tip cane along track in gym.  Min/guard to min A with cane and cues/facilitation for posture and increased L hip protraction during stance phase of gait.  Then progressed to performing gait in figure 8 pattern to work on turns as this seems to be most challenging.  Cues for smaller inner step and larger outer step with hips following steps.     Ambulation Distance (Feet) 115 Feet  then 230'   Assistive device Prosthesis;Rolling walker;Other (Comment)  quad tip cane   Gait Pattern Step-to pattern;Decreased step length - right;Decreased stance time - left;Decreased stride length;Decreased hip/knee flexion - left;Decreased weight shift to left;Left hip hike;Left steppage;Left flexed  knee in stance;Antalgic;Lateral hip instability;Trunk flexed;Abducted - left;Poor foot clearance - left   Ambulation Surface Level;Indoor   Stairs Yes   Stairs Assistance 5: Supervision   Stairs Assistance Details (indicate cue type and reason) Performed stairs in 3 varying ways to better simulate home.  First performed with L rail and cane in R hand to ascend at S level with min cues for technique.  Then performed with L rail only forwards at S to min/guard level with cues for safety.  Lastly, had pt try sideways with L rail only and pt able to perform at S to mod I level, therefore recommended he do this at home with wife when weather permits.     Stair Management Technique Sideways;Forwards;Step to pattern;One rail Left  see above comments   Number of Stairs 4  x 3 reps   Height of Stairs 6     Therapeutic Activites    Therapeutic Activities Other Therapeutic Activities   Other Therapeutic Activities Had pt work on sink exercises, see pt instruction for details with cues on improved lateral weight shift as he was only moving hands/arms and not acutally weight shifting.  Also provided education on how to progress.  Performed standing R LE toe taps to small balance beam with use of quad tip cane x 10 reps to increase WB tolerance on L prosthesis, tolerated well with some increase in low back pain.       Prosthetics   Prosthetic Care Comments  Continued to review adjusting socks.  Note he needed to add 1 ply for 2 ply total during visit as he did not have socks with him (PT provided).  Also educated to increase wear time to 4 hrs 2x/day.    Current prosthetic wear tolerance (days/week)  daily   Current prosthetic wear tolerance (#hours/day)  3 hrs 2x/day   Residual limb condition  Continues to have small wound on residual limb where scab has been removed but looks to be healing well.  Still using tegaderm as needed.     Education Provided Skin check;Residual limb care;Correct ply sock  adjustment;Proper Donning;Proper wear schedule/adjustment;Other (comment)   Person(s) Educated Patient   Education Method Explanation;Demonstration;Handout  handout on HEP   Education Method Verbalized understanding;Returned demonstration   Donning Prosthesis Modified independent (device/increased time)                PT Education - 08/15/16 1415    Education provided Yes   Education Details increasing wear time up to 4hrs 2x/day, how to advance HEP at sink and provided handout.  Also discussed having pt ambulate into clinic with RW since wife drops him off and walks with him in.     Person(s) Educated Patient   Methods Explanation   Comprehension Verbalized understanding          PT  Short Term Goals - 07/31/16 1607      PT SHORT TERM GOAL #1   Title Patient tolerates wear of prosthesis >/= 8 hrs total without wounds increasing in size and no residual limb pain. (Target Date: 08/22/2016)   Time 5   Period Weeks   Status On-going     PT SHORT TERM GOAL #2   Title Patient demonstrates proper donning & verbalizes proper cleaning of prosthesis. (Target Date: 08/22/2016)   Time 5   Period Weeks   Status On-going     PT SHORT TERM GOAL #3   Title Patient stands without UE support 1 minute and reaches 5" anteriorly with supervision. (Target Date: 08/22/2016)   Time 5   Period Weeks   Status On-going     PT SHORT TERM GOAL #4   Title Patient ambulates 200' including around obstacles & scanning environment with RW & prosthesis safely with cues only for deviations. (Target Date: 08/22/2016)   Time 5   Period Weeks   Status On-going     PT SHORT TERM GOAL #5   Title Patient negotiates ramps & curbs with RW & stairs with 2 rails with prosthesis with supervision. (Target Date: 08/22/2016)   Time 5   Period Weeks   Status On-going     PT SHORT TERM GOAL #6   Title Patient tolerates standing & gait activities for 5 minutes with chronic pain in knees & back increasing </= 4  increments on 0-10 scale. (Target Date: 08/22/2016)   Time 5   Period Weeks   Status On-going           PT Long Term Goals - 07/31/16 1608      PT LONG TERM GOAL #1   Title Patient tolerates wear of prosthesis >90% of awake hours without skin issues or residual limb pain to enable function during his day. (Target Date: 09/26/2016)   Time 10   Period Weeks   Status On-going     PT LONG TERM GOAL #2   Title Patient demonstrates & verbalizes proper prosthetic care to enable safe use of prosthesis for function. (Target Date: 09/26/2016)   Time 10   Period Weeks   Status On-going     PT LONG TERM GOAL #3   Title Patient reports pain in knees & back increases </= 2 increments on 0-10 scale with standing & gait activities. (Target Date: 09/26/2016)   Time 10   Period Weeks   Status On-going     PT LONG TERM GOAL #4   Title Berg Balance > 36/56 to indicate lower fall risk. (Target Date: 09/26/2016)   Time 10   Period Weeks   Status On-going     PT LONG TERM GOAL #5   Title Patient ambulates >400' outdoors with prosthesis & LRAD modified independent for community mobility. (Target Date: 09/26/2016)   Time 10   Period Weeks   Status On-going     PT LONG TERM GOAL #6   Title Patient negotiates ramps, curbs & stairs with prosthesis & LRAD modified independent to enable community access. (Target Date: 09/26/2016)   Time 10   Period Weeks   Status On-going               Plan - 08/15/16 1411    Clinical Impression Statement Skilled session focused on gait with RW, reviewing sink HEP, education to begin gait into clinic with RW and wife providing S, and initiation of gait and stairs with quad tip cane.  Tolerated well.     Rehab Potential Good   PT Frequency 2x / week   PT Duration Other (comment)  10 weeks   PT Treatment/Interventions ADLs/Self Care Home Management;DME Instruction;Gait training;Stair training;Functional mobility training;Therapeutic activities;Therapeutic  exercise;Balance training;Neuromuscular re-education;Patient/family education;Prosthetic Training   PT Next Visit Plan Review prosthetic care, Prosthetic gait with RW including barriers (did progress to quad tip cane with [facility]-maybe go to [facility] if able?), progress sink HEP/balance as needed   Consulted and Agree with Plan of Care Patient      Patient will benefit from skilled therapeutic intervention in order to improve the following deficits and impairments:  Abnormal gait, Decreased activity tolerance, Decreased balance, Decreased endurance, Decreased knowledge of precautions, Decreased knowledge of use of DME, Decreased mobility, Decreased strength, Impaired flexibility, Postural dysfunction, Prosthetic Dependency  Visit Diagnosis: Other abnormalities of gait and mobility  Unsteadiness on feet  Other symptoms and signs involving the musculoskeletal system     Problem List Patient Active Problem List   Diagnosis Date Noted  . Acute osteomyelitis of left foot ([place])   . Diabetic foot ([place]) 02/02/2016  . Osteomyelitis ([place]) 02/02/2016  . Osteomyelitis of foot, acute, left 02/02/2016  . Diabetic foot infection ([place]) 02/02/2016  . CKD (chronic kidney disease), stage II 02/02/2016  . Anemia 02/02/2016  . Kidney stones 11/30/2013  . Cholelithiasis: PER CT ABD/PELVIS AND ABD [place] 11/30/13 11/30/2013  . Acute renal failure ([place]) 11/29/2013  . Acute pancreatitis 11/29/2013  . Asthma exacerbation 11/09/2013  . CAP (community acquired pneumonia) 07/18/2013  . Diabetes ([place]) 07/18/2013  . Hypertension     Cameron Sprang, PT, MPT [facility] [address] [place], [place], [address] Phone: [phone]   Fax:  [phone] 08/15/16, 3:19 PM  Name: Troy [name] MRN: [id number] Date of Birth: 1965-11-13

## 2016-08-15 NOTE — Patient Instructions (Signed)
Do each exercise 2-3  times per day Do each exercise 10 repetitions Hold each exercise for 5-7 seconds to feel your location  AT [place].  USE TAPE ON FLOOR TO MARK THE MIDLINE POSITION. You also should try to feel with your limb pressure in socket.  You are trying to feel with limb what you used to feel with the bottom of your foot.  1. Side to Side Shift: Moving your hips only (not shoulders): move weight onto your left leg, HOLD/FEEL.  Move back to equal weight on each leg, HOLD/FEEL. Move weight onto your right leg, HOLD/FEEL. Move back to equal weight on each leg, HOLD/FEEL. Repeat. 2. Front to Back Shift: Moving your hips only (not shoulders): move your weight forward onto your toes, HOLD/FEEL. Move your weight back to equal Flat Foot on both legs, HOLD/FEEL. Move your weight back onto your heels, HOLD/FEEL. Move your weight back to equal on both legs, HOLD/FEEL. Repeat. 3. Moving Cones / Cups: With equal weight on each leg: Hold on with one hand the first time, then progress to no hand supports. Move cups from one side of sink to the other. Place cups ~2" out of your reach, progress to 10" beyond reach. 4. Overhead/Upward Reaching: alternated reaching up to top cabinets or ceiling if no cabinets present. Keep equal weight on each leg. Start with one hand support on counter while other hand reaches and progress to no hand support with reaching. 5.   Looking Over Shoulders: With equal weight on each leg: alternate turning to look          over your shoulders with one hand support on counter as needed. Shift weight to             side looking, pull hip then shoulder then head/eyes around to look behind you. Start       with one hand support & progress to no hand support.

## 2016-08-19 ENCOUNTER — Encounter: Payer: Self-pay | Admitting: Physical Therapy

## 2016-08-19 ENCOUNTER — Ambulatory Visit: Payer: BLUE CROSS/BLUE SHIELD | Admitting: Physical Therapy

## 2016-08-19 DIAGNOSIS — M25562 Pain in left knee: Secondary | ICD-10-CM

## 2016-08-19 DIAGNOSIS — G8929 Other chronic pain: Secondary | ICD-10-CM

## 2016-08-19 DIAGNOSIS — R2689 Other abnormalities of gait and mobility: Secondary | ICD-10-CM

## 2016-08-19 DIAGNOSIS — R29898 Other symptoms and signs involving the musculoskeletal system: Secondary | ICD-10-CM

## 2016-08-19 DIAGNOSIS — R2681 Unsteadiness on feet: Secondary | ICD-10-CM

## 2016-08-19 DIAGNOSIS — M545 Low back pain: Secondary | ICD-10-CM

## 2016-08-19 DIAGNOSIS — M25561 Pain in right knee: Secondary | ICD-10-CM

## 2016-08-19 NOTE — Therapy (Deleted)
[facility] [address] [place], [place], [address] Phone: [phone]   Fax:  [phone]  Physical Therapy Evaluation  Patient Details  Name: Troy [name] MRN: [id number] Date of Birth: [birth year] Referring Provider: Rodell Perna, MD  Encounter Date: 08/19/2016      PT End of Session - 08/19/16 1453    Visit Number 5   Number of Visits 20   Date for PT Re-Evaluation 09/26/16   Authorization Type [employer] 30 visit limit   Authorization - Visit Number 5   Authorization - Number of Visits 30   PT Start Time 5361   Equipment Utilized During Treatment Gait belt   Activity Tolerance Patient tolerated treatment well   Behavior During Therapy [facility] for tasks assessed/performed      Past Medical History:  Diagnosis Date  . Asthma   . Bronchitis   . CKD (chronic kidney disease)   . Diabetes mellitus   . Diabetic Charcot foot ([place])    Left  . Gout   . Hypertension   . Kidney stones 11/30/2013  . Neuromuscular disorder ([place])   . Obesity   . Polyneuropathy in diabetes [facility])     Past Surgical History:  Procedure Laterality Date  . AMPUTATION Left 02/06/2016   Procedure: AMPUTATION BELOW KNEE;  Surgeon: Marybelle Killings, MD;  Location: [facility];  Service: Orthopedics;  Laterality: Left;  . CHOLECYSTECTOMY N/A 12/03/2013   Procedure: LAPAROSCOPIC CHOLECYSTECTOMY WITH INTRAOPERATIVE CHOLANGIOGRAM;  Surgeon: Rolm Bookbinder, MD;  Location: [facility];  Service: General;  Laterality: N/A;  . NO PAST SURGERIES      There were no vitals filed for this visit.       Subjective Assessment - 08/19/16 1450    Subjective No new complaints. No falls to report. Has had increased back pain past few days, however reports he's been more active/walking more as well.   Patient is accompained by: Family member   Pertinent History HTN, DM2, CKD stage II, asthma, gout   Limitations Lifting;Standing;Walking   Patient Stated Goals He wants to use  prosthesis to get back in community,    Currently in Pain? Yes   Pain Location Back   Pain Orientation Lower   Pain Descriptors / Indicators Aching;Sore   Pain Type Chronic pain   Pain Onset More than a month ago   Pain Frequency Intermittent   Aggravating Factors  increased activity, sitting too long   Pain Relieving Factors rest, muscle relaxer                       [facility] Adult PT Treatment/Exercise - 08/19/16 1454      Transfers   Transfers Sit to Stand;Stand to Sit   Sit to Stand 6: Modified independent (Device/Increase time);With upper extremity assist;From chair/3-in-1   Stand to Sit 6: Modified independent (Device/Increase time);With upper extremity assist;To chair/3-in-1     Ambulation/Gait   Ambulation/Gait Yes   Ambulation/Gait Assistance 5: Supervision   Ambulation/Gait Assistance Details supervision with RW with cues on posture and to correct gait deviations   Ambulation Distance (Feet) 220 Feet  x1, 120 x1 RW; 240 x1 cane   Assistive device Rolling walker;Prosthesis;Straight cane  straight cane with rubber quad tip   Gait Pattern Step-through pattern;Decreased stride length;Decreased stance time - left;Decreased step length - right;Trunk flexed;Narrow base of support   Ambulation Surface Level;Indoor   Stairs Yes   Stairs Assistance 5: Supervision   Stairs Assistance Details (indicate cue  type and reason) no cues or assistance needed with stair negotiation   Stair Management Technique Two rails;Step to pattern;Forwards   Number of Stairs 4   Height of Stairs 6   Ramp 5: Supervision   Ramp Details (indicate cue type and reason) x 1 rep with RW/prosthesis   Curb 5: Supervision   Curb Details (indicate cue type and reason) x1 reps with prosthesis/RW     High Level Balance   High Level Balance Activities Figure 8 turns   High Level Balance Comments with cane: figure 8's around hoola hoops with cues on pelvic position, step length and cane placement x  3 laps, min guard to min assist.     Prosthetics   Current prosthetic wear tolerance (days/week)  daily   Current prosthetic wear tolerance (#hours/day)  4 hours 2x day   Residual limb condition  wound appears to be fully healed with just some dry skin present. No longer needs tegaderm barrier. Pt educated on how to continue to monitor wound should it open up again.   Education Provided Residual limb care;Proper wear schedule/adjustment;Proper weight-bearing schedule/adjustment   Person(s) Educated Patient   Education Method Explanation;Demonstration   Education Method Verbalized understanding;Needs further instruction;Verbal cues required   Donning Prosthesis Supervision   Doffing Prosthesis Supervision                  PT Short Term Goals - 08/19/16 1456      PT SHORT TERM GOAL #1   Title Patient tolerates wear of prosthesis >/= 8 hrs total without wounds increasing in size and no residual limb pain. (Target Date: 08/22/2016)   Baseline 08/19/16: met today as wound is healed and pt wearing >/= 8 hours a day without residual limb pain   Status Achieved     PT SHORT TERM GOAL #2   Title Patient demonstrates proper donning & verbalizes proper cleaning of prosthesis. (Target Date: 08/22/2016)   Baseline 08/19/16: met today   Status Achieved     PT SHORT TERM GOAL #3   Title Patient stands without UE support 1 minute and reaches 5" anteriorly with supervision. (Target Date: 08/22/2016)   Baseline 08/19/16: met today   Status Achieved     PT SHORT TERM GOAL #4   Title Patient ambulates 200' including around obstacles & scanning environment with RW & prosthesis safely with cues only for deviations. (Target Date: 08/22/2016)   Baseline 08/19/16: met today   Time --   Period --   Status Achieved     PT SHORT TERM GOAL #5   Title Patient negotiates ramps & curbs with RW & stairs with 2 rails with prosthesis with supervision. (Target Date: 08/22/2016)   Baseline 08/19/16: met today    Time --   Period --   Status Achieved     PT SHORT TERM GOAL #6   Title Patient tolerates standing & gait activities for 5 minutes with chronic pain in knees & back increasing </= 4 increments on 0-10 scale. (Target Date: 08/22/2016)   Baseline 08/19/16: met today during session   Time --   Period --   Status Achieved           PT Long Term Goals - 07/31/16 1608      PT LONG TERM GOAL #1   Title Patient tolerates wear of prosthesis >90% of awake hours without skin issues or residual limb pain to enable function during his day. (Target Date: 09/26/2016)   Time 10  Period Weeks   Status On-going     PT LONG TERM GOAL #2   Title Patient demonstrates & verbalizes proper prosthetic care to enable safe use of prosthesis for function. (Target Date: 09/26/2016)   Time 10   Period Weeks   Status On-going     PT LONG TERM GOAL #3   Title Patient reports pain in knees & back increases </= 2 increments on 0-10 scale with standing & gait activities. (Target Date: 09/26/2016)   Time 10   Period Weeks   Status On-going     PT LONG TERM GOAL #4   Title Berg Balance > 36/56 to indicate lower fall risk. (Target Date: 09/26/2016)   Time 10   Period Weeks   Status On-going     PT LONG TERM GOAL #5   Title Patient ambulates >400' outdoors with prosthesis & LRAD modified independent for community mobility. (Target Date: 09/26/2016)   Time 10   Period Weeks   Status On-going     PT LONG TERM GOAL #6   Title Patient negotiates ramps, curbs & stairs with prosthesis & LRAD modified independent to enable community access. (Target Date: 09/26/2016)   Time 10   Period Weeks   Status On-going               Plan - 08/19/16 1454    Clinical Impression Statement Pt has met all STGs to date. Remainder of session continued to address gait/mobility with straight cane. Pt is making steady progress toward goals and should benefit from continued PT to progress toward unmet goals.    Rehab Potential Good    PT Frequency 2x / week   PT Duration Other (comment)  10 weeks   PT Treatment/Interventions ADLs/Self Care Home Management;DME Instruction;Gait training;Stair training;Functional mobility training;Therapeutic activities;Therapeutic exercise;Balance training;Neuromuscular re-education;Patient/family education;Prosthetic Training   PT Next Visit Plan Review prosthetic care, Prosthetic gait with cane including barriers, balance activities   Consulted and Agree with Plan of Care Patient      Patient will benefit from skilled therapeutic intervention in order to improve the following deficits and impairments:  Abnormal gait, Decreased activity tolerance, Decreased balance, Decreased endurance, Decreased knowledge of precautions, Decreased knowledge of use of DME, Decreased mobility, Decreased strength, Impaired flexibility, Postural dysfunction, Prosthetic Dependency  Visit Diagnosis: No diagnosis found.     Problem List Patient Active Problem List   Diagnosis Date Noted  . Acute osteomyelitis of left foot ([place])   . Diabetic foot ([place]) 02/02/2016  . Osteomyelitis ([place]) 02/02/2016  . Osteomyelitis of foot, acute, left 02/02/2016  . Diabetic foot infection ([place]) 02/02/2016  . CKD (chronic kidney disease), stage II 02/02/2016  . Anemia 02/02/2016  . Kidney stones 11/30/2013  . Cholelithiasis: PER CT ABD/PELVIS AND ABD [place] 11/30/13 11/30/2013  . Acute renal failure ([place]) 11/29/2013  . Acute pancreatitis 11/29/2013  . Asthma exacerbation 11/09/2013  . CAP (community acquired pneumonia) 07/18/2013  . Diabetes ([place]) 07/18/2013  . Hypertension     Willow Ora 08/19/2016, 3:31 PM  [facility] [address] [place], [place], [address] Phone: [phone]   Fax:  [phone]  Name: Timarion Agcaoili MRN: [id number] Date of Birth: 05/13/1966

## 2016-08-19 NOTE — Therapy (Signed)
[facility] [address] [place], [place], [address] Phone: [phone]   Fax:  [phone]  Physical Therapy Treatment  Patient Details  Name: Troy [name] MRN: [id number] Date of Birth: July 16, 1966 Referring Provider: Rodell Perna, MD  Encounter Date: 08/19/2016      PT End of Session - 08/19/16 5170    Visit Number 5   Number of Visits 20   Date for PT Re-Evaluation 09/26/16   Authorization Type [employer] 30 visit limit   Authorization - Visit Number 5   Authorization - Number of Visits 30   PT Start Time 0174   PT Stop Time 1530   PT Time Calculation (min) 43 min   Equipment Utilized During Treatment Gait belt   Activity Tolerance Patient tolerated treatment well   Behavior During Therapy WFL for tasks assessed/performed      Past Medical History:  Diagnosis Date  . Asthma   . Bronchitis   . CKD (chronic kidney disease)   . Diabetes mellitus   . Diabetic Charcot foot ([place])    Left  . Gout   . Hypertension   . Kidney stones 11/30/2013  . Neuromuscular disorder ([place])   . Obesity   . Polyneuropathy in diabetes [facility])     Past Surgical History:  Procedure Laterality Date  . AMPUTATION Left 02/06/2016   Procedure: AMPUTATION BELOW KNEE;  Surgeon: Marybelle Killings, MD;  Location: [facility];  Service: Orthopedics;  Laterality: Left;  . CHOLECYSTECTOMY N/A 12/03/2013   Procedure: LAPAROSCOPIC CHOLECYSTECTOMY WITH INTRAOPERATIVE CHOLANGIOGRAM;  Surgeon: Rolm Bookbinder, MD;  Location: [facility];  Service: General;  Laterality: N/A;  . NO PAST SURGERIES      There were no vitals filed for this visit.      Subjective Assessment - 08/19/16 1450    Subjective No new complaints. No falls to report. Has had increased back pain past few days, however reports he's been more active/walking more as well.   Patient is accompained by: Family member   Pertinent History HTN, DM2, CKD stage II, asthma, gout   Limitations  Lifting;Standing;Walking   Patient Stated Goals He wants to use prosthesis to get back in community,    Currently in Pain? Yes   Pain Location Back   Pain Orientation Lower   Pain Descriptors / Indicators Aching;Sore   Pain Type Chronic pain   Pain Onset More than a month ago   Pain Frequency Intermittent   Aggravating Factors  increased activity, sitting too long   Pain Relieving Factors rest, muscle relaxer              [facility] Adult PT Treatment/Exercise - 08/19/16 1454      Transfers   Transfers Sit to Stand;Stand to Sit   Sit to Stand 6: Modified independent (Device/Increase time);With upper extremity assist;From chair/3-in-1   Stand to Sit 6: Modified independent (Device/Increase time);With upper extremity assist;To chair/3-in-1     Ambulation/Gait   Ambulation/Gait Yes   Ambulation/Gait Assistance 5: Supervision   Ambulation/Gait Assistance Details supervision with RW with cues on posture and to correct gait deviations. min guard to min assist for balance with cane along with cues on step length, posture, weight shifting and cane placement.                                      Ambulation Distance (Feet) 220 Feet  x1, 120 x1  RW; 240 x1 cane   Assistive device Rolling walker;Prosthesis;Straight cane  straight cane with rubber quad tip   Gait Pattern Step-through pattern;Decreased stride length;Decreased stance time - left;Decreased step length - right;Trunk flexed;Narrow base of support   Ambulation Surface Level;Indoor   Stairs Yes   Stairs Assistance 5: Supervision   Stairs Assistance Details (indicate cue type and reason) no cues or assistance needed with stair negotiation   Stair Management Technique Two rails;Step to pattern;Forwards   Number of Stairs 4   Height of Stairs 6   Ramp 5: Supervision   Ramp Details (indicate cue type and reason) x 1 rep with RW/prosthesis   Curb 5: Supervision   Curb Details (indicate cue type and reason) x1 reps with prosthesis/RW      High Level Balance   High Level Balance Activities Figure 8 turns   High Level Balance Comments with cane: figure 8's around hoola hoops with cues on pelvic position, step length and cane placement x 3 laps, min guard to min assist.     Prosthetics   Current prosthetic wear tolerance (days/week)  daily   Current prosthetic wear tolerance (#hours/day)  4 hours 2x day   Residual limb condition  wound appears to be fully healed with just some dry skin present. No longer needs tegaderm barrier. Pt educated on how to continue to monitor wound should it open up again.   Education Provided Residual limb care;Proper wear schedule/adjustment;Proper weight-bearing schedule/adjustment   Person(s) Educated Patient   Education Method Explanation;Demonstration   Education Method Verbalized understanding;Needs further instruction;Verbal cues required   Donning Prosthesis Supervision   Doffing Prosthesis Supervision            PT Short Term Goals - 08/19/16 1456      PT SHORT TERM GOAL #1   Title Patient tolerates wear of prosthesis >/= 8 hrs total without wounds increasing in size and no residual limb pain. (Target Date: 08/22/2016)   Baseline 08/19/16: met today as wound is healed and pt wearing >/= 8 hours a day without residual limb pain   Status Achieved     PT SHORT TERM GOAL #2   Title Patient demonstrates proper donning & verbalizes proper cleaning of prosthesis. (Target Date: 08/22/2016)   Baseline 08/19/16: met today   Status Achieved     PT SHORT TERM GOAL #3   Title Patient stands without UE support 1 minute and reaches 5" anteriorly with supervision. (Target Date: 08/22/2016)   Baseline 08/19/16: met today   Status Achieved     PT SHORT TERM GOAL #4   Title Patient ambulates 200' including around obstacles & scanning environment with RW & prosthesis safely with cues only for deviations. (Target Date: 08/22/2016)   Baseline 08/19/16: met today   Time --   Period --   Status Achieved      PT SHORT TERM GOAL #5   Title Patient negotiates ramps & curbs with RW & stairs with 2 rails with prosthesis with supervision. (Target Date: 08/22/2016)   Baseline 08/19/16: met today   Time --   Period --   Status Achieved     PT SHORT TERM GOAL #6   Title Patient tolerates standing & gait activities for 5 minutes with chronic pain in knees & back increasing </= 4 increments on 0-10 scale. (Target Date: 08/22/2016)   Baseline 08/19/16: met today during session   Time --   Period --   Status Achieved             PT Long Term Goals - 07/31/16 1608      PT LONG TERM GOAL #1   Title Patient tolerates wear of prosthesis >90% of awake hours without skin issues or residual limb pain to enable function during his day. (Target Date: 09/26/2016)   Time 10   Period Weeks   Status On-going     PT LONG TERM GOAL #2   Title Patient demonstrates & verbalizes proper prosthetic care to enable safe use of prosthesis for function. (Target Date: 09/26/2016)   Time 10   Period Weeks   Status On-going     PT LONG TERM GOAL #3   Title Patient reports pain in knees & back increases </= 2 increments on 0-10 scale with standing & gait activities. (Target Date: 09/26/2016)   Time 10   Period Weeks   Status On-going     PT LONG TERM GOAL #4   Title Berg Balance > 36/56 to indicate lower fall risk. (Target Date: 09/26/2016)   Time 10   Period Weeks   Status On-going     PT LONG TERM GOAL #5   Title Patient ambulates >400' outdoors with prosthesis & LRAD modified independent for community mobility. (Target Date: 09/26/2016)   Time 10   Period Weeks   Status On-going     PT LONG TERM GOAL #6   Title Patient negotiates ramps, curbs & stairs with prosthesis & LRAD modified independent to enable community access. (Target Date: 09/26/2016)   Time 10   Period Weeks   Status On-going           Plan - 08/19/16 1454    Clinical Impression Statement Pt has met all STGs to date. Remainder of session  continued to address gait/mobility with straight cane. Rest breaks taken due to increased back pain with increased activity, no more than 2-3 point increase reported. Pt is making steady progress toward goals and should benefit from continued PT to progress toward unmet goals.    Rehab Potential Good   PT Frequency 2x / week   PT Duration Other (comment)  10 weeks   PT Treatment/Interventions ADLs/Self Care Home Management;DME Instruction;Gait training;Stair training;Functional mobility training;Therapeutic activities;Therapeutic exercise;Balance training;Neuromuscular re-education;Patient/family education;Prosthetic Training   PT Next Visit Plan Review prosthetic care, Prosthetic gait with cane including barriers, balance activities   Consulted and Agree with Plan of Care Patient      Patient will benefit from skilled therapeutic intervention in order to improve the following deficits and impairments:  Abnormal gait, Decreased activity tolerance, Decreased balance, Decreased endurance, Decreased knowledge of precautions, Decreased knowledge of use of DME, Decreased mobility, Decreased strength, Impaired flexibility, Postural dysfunction, Prosthetic Dependency  Visit Diagnosis: Other abnormalities of gait and mobility  Unsteadiness on feet  Other symptoms and signs involving the musculoskeletal system  Chronic pain of left knee  Chronic pain of right knee  Chronic midline low back pain, with sciatica presence unspecified     Problem List Patient Active Problem List   Diagnosis Date Noted  . Acute osteomyelitis of left foot ([place])   . Diabetic foot ([place]) 02/02/2016  . Osteomyelitis ([place]) 02/02/2016  . Osteomyelitis of foot, acute, left 02/02/2016  . Diabetic foot infection ([place]) 02/02/2016  . CKD (chronic kidney disease), stage II 02/02/2016  . Anemia 02/02/2016  . Kidney stones 11/30/2013  . Cholelithiasis: PER CT ABD/PELVIS AND ABD [place] 11/30/13 11/30/2013  . Acute renal failure  ([place]) 11/29/2013  . Acute pancreatitis 11/29/2013  . Asthma exacerbation 11/09/2013  .  CAP (community acquired pneumonia) 07/18/2013  . Diabetes ([facility]) 07/18/2013  . Hypertension      , PTA, CLT [facility] [address], [address] [place], [place] [address] [phone] 08/19/16, 3:41 PM   Name: Troy [name] MRN: [id number] Date of Birth: 09/06/1965   

## 2016-08-21 ENCOUNTER — Ambulatory Visit: Payer: BLUE CROSS/BLUE SHIELD | Admitting: Physical Therapy

## 2016-08-21 ENCOUNTER — Encounter: Payer: Self-pay | Admitting: Physical Therapy

## 2016-08-21 DIAGNOSIS — R2681 Unsteadiness on feet: Secondary | ICD-10-CM

## 2016-08-21 DIAGNOSIS — R2689 Other abnormalities of gait and mobility: Secondary | ICD-10-CM | POA: Diagnosis not present

## 2016-08-21 DIAGNOSIS — M25562 Pain in left knee: Secondary | ICD-10-CM

## 2016-08-21 DIAGNOSIS — R29898 Other symptoms and signs involving the musculoskeletal system: Secondary | ICD-10-CM

## 2016-08-21 DIAGNOSIS — G8929 Other chronic pain: Secondary | ICD-10-CM

## 2016-08-21 DIAGNOSIS — M25561 Pain in right knee: Secondary | ICD-10-CM

## 2016-08-21 DIAGNOSIS — M545 Low back pain: Secondary | ICD-10-CM

## 2016-08-22 NOTE — Therapy (Signed)
[facility] [address] [place], [place], [address] Phone: [phone]   Fax:  [phone]  Physical Therapy Treatment  Patient Details  Name: Troy [name] MRN: [id number] Date of Birth: [birth year] Referring Provider: Rodell Perna, MD  Encounter Date: 08/21/2016      PT End of Session - 08/21/16 1452    Visit Number 6   Number of Visits 20   Date for PT Re-Evaluation 09/26/16   Authorization Type [employer] 30 visit limit   Authorization - Visit Number 6   Authorization - Number of Visits 30   PT Start Time 1450   PT Stop Time 1530   PT Time Calculation (min) 40 min   Equipment Utilized During Treatment Gait belt   Activity Tolerance Patient tolerated treatment well   Behavior During Therapy [facility] for tasks assessed/performed      Past Medical History:  Diagnosis Date  . Asthma   . Bronchitis   . CKD (chronic kidney disease)   . Diabetes mellitus   . Diabetic Charcot foot ([place])    Left  . Gout   . Hypertension   . Kidney stones 11/30/2013  . Neuromuscular disorder ([place])   . Obesity   . Polyneuropathy in diabetes [facility])     Past Surgical History:  Procedure Laterality Date  . AMPUTATION Left 02/06/2016   Procedure: AMPUTATION BELOW KNEE;  Surgeon: Marybelle Killings, MD;  Location: [facility];  Service: Orthopedics;  Laterality: Left;  . CHOLECYSTECTOMY N/A 12/03/2013   Procedure: LAPAROSCOPIC CHOLECYSTECTOMY WITH INTRAOPERATIVE CHOLANGIOGRAM;  Surgeon: Rolm Bookbinder, MD;  Location: [facility];  Service: General;  Laterality: N/A;  . NO PAST SURGERIES      There were no vitals filed for this visit.      Subjective Assessment - 08/21/16 1452    Subjective No new complaints. No falls to report. Back "is okay" today.    Patient is accompained by: Family member   Pertinent History HTN, DM2, CKD stage II, asthma, gout   Limitations Lifting;Standing;Walking   Patient Stated Goals He wants to use prosthesis to get back in  community,    Currently in Pain? No/denies   Pain Score 0-No pain           [facility] Adult PT Treatment/Exercise - 08/21/16 1453      Transfers   Transfers Sit to Stand;Stand to Sit   Sit to Stand 6: Modified independent (Device/Increase time);With upper extremity assist;From chair/3-in-1   Stand to Sit 6: Modified independent (Device/Increase time);With upper extremity assist;To chair/3-in-1     Ambulation/Gait   Ambulation/Gait Yes   Ambulation/Gait Assistance 4: Min guard;4: Min assist   Ambulation/Gait Assistance Details cues on posture and step length with use of cane with gait, distance limited by increase in back pain (up to 4/10). pain decreased with seated rest break. no complaints with remainder of session.    Ambulation Distance (Feet) 240 Feet  x1   Assistive device Straight cane;Prosthesis   Ambulation Surface Level;Indoor   Stairs Yes   Stairs Assistance 5: Supervision   Stairs Assistance Details (indicate cue type and reason) cues on sequencing with use of cane/rail combination, cues on posture and weight shifting as well.   Stair Management Technique One rail Right;One rail Left;Step to pattern;Forwards;With cane   Number of Stairs 4  x 2 reps   Height of Stairs 6   Ramp 4: Min assist;Other (comment)  min to min guard assist with prosthesis/cane   Ramp Details (indicate  cue type and reason) x 2 reps with cues on sequencing, posture, and technique   Curb 4: Min assist;Other (comment)  min to min guard assist with prosthesis/cane   Curb Details (indicate cue type and reason) x 2 reps with cues on sequencing and technique.      High Level Balance   High Level Balance Activities Negotiating over obstacles   High Level Balance Comments with cane: stepping over 4 bolsters of varied height x 4 laps     Prosthetics   Prosthetic Care Comments  pt to increase to 5 hours 2x day   Current prosthetic wear tolerance (days/week)  daily   Current prosthetic wear tolerance  (#hours/day)  4 hours 2x day   Residual limb condition  intact with no issues   Education Provided Proper wear schedule/adjustment;Proper weight-bearing schedule/adjustment;Correct ply sock adjustment;Residual limb care   Person(s) Educated Patient   Education Method Explanation;Verbal cues;Demonstration   Education Method Verbalized understanding   Donning Prosthesis Supervision   Doffing Prosthesis Supervision           PT Short Term Goals - 08/19/16 1456      PT SHORT TERM GOAL #1   Title Patient tolerates wear of prosthesis >/= 8 hrs total without wounds increasing in size and no residual limb pain. (Target Date: 08/22/2016)   Baseline 08/19/16: met today as wound is healed and pt wearing >/= 8 hours a day without residual limb pain   Status Achieved     PT SHORT TERM GOAL #2   Title Patient demonstrates proper donning & verbalizes proper cleaning of prosthesis. (Target Date: 08/22/2016)   Baseline 08/19/16: met today   Status Achieved     PT SHORT TERM GOAL #3   Title Patient stands without UE support 1 minute and reaches 5" anteriorly with supervision. (Target Date: 08/22/2016)   Baseline 08/19/16: met today   Status Achieved     PT SHORT TERM GOAL #4   Title Patient ambulates 200' including around obstacles & scanning environment with RW & prosthesis safely with cues only for deviations. (Target Date: 08/22/2016)   Baseline 08/19/16: met today   Time --   Period --   Status Achieved     PT SHORT TERM GOAL #5   Title Patient negotiates ramps & curbs with RW & stairs with 2 rails with prosthesis with supervision. (Target Date: 08/22/2016)   Baseline 08/19/16: met today   Time --   Period --   Status Achieved     PT SHORT TERM GOAL #6   Title Patient tolerates standing & gait activities for 5 minutes with chronic pain in knees & back increasing </= 4 increments on 0-10 scale. (Target Date: 08/22/2016)   Baseline 08/19/16: met today during session   Time --   Period --    Status Achieved           PT Long Term Goals - 07/31/16 1608      PT LONG TERM GOAL #1   Title Patient tolerates wear of prosthesis >90% of awake hours without skin issues or residual limb pain to enable function during his day. (Target Date: 09/26/2016)   Time 10   Period Weeks   Status On-going     PT LONG TERM GOAL #2   Title Patient demonstrates & verbalizes proper prosthetic care to enable safe use of prosthesis for function. (Target Date: 09/26/2016)   Time 10   Period Weeks   Status On-going     PT LONG  TERM GOAL #3   Title Patient reports pain in knees & back increases </= 2 increments on 0-10 scale with standing & gait activities. (Target Date: 09/26/2016)   Time 10   Period Weeks   Status On-going     PT LONG TERM GOAL #4   Title Berg Balance > 36/56 to indicate lower fall risk. (Target Date: 09/26/2016)   Time 10   Period Weeks   Status On-going     PT LONG TERM GOAL #5   Title Patient ambulates >400' outdoors with prosthesis & LRAD modified independent for community mobility. (Target Date: 09/26/2016)   Time 10   Period Weeks   Status On-going     PT LONG TERM GOAL #6   Title Patient negotiates ramps, curbs & stairs with prosthesis & LRAD modified independent to enable community access. (Target Date: 09/26/2016)   Time 10   Period Weeks   Status On-going            Plan - 08/21/16 1452    Clinical Impression Statement Today's session continued to address gait with cane, including barriers. Pt is making steady progress toward goals and should benefit from continued PT to progress toward unmet goals.   Rehab Potential Good   PT Frequency 2x / week   PT Duration Other (comment)  10 weeks   PT Treatment/Interventions ADLs/Self Care Home Management;DME Instruction;Gait training;Stair training;Functional mobility training;Therapeutic activities;Therapeutic exercise;Balance training;Neuromuscular re-education;Patient/family education;Prosthetic Training   PT Next  Visit Plan Review prosthetic care, Prosthetic gait with cane including barriers, balance activities   Consulted and Agree with Plan of Care Patient      Patient will benefit from skilled therapeutic intervention in order to improve the following deficits and impairments:  Abnormal gait, Decreased activity tolerance, Decreased balance, Decreased endurance, Decreased knowledge of precautions, Decreased knowledge of use of DME, Decreased mobility, Decreased strength, Impaired flexibility, Postural dysfunction, Prosthetic Dependency  Visit Diagnosis: Other abnormalities of gait and mobility  Unsteadiness on feet  Other symptoms and signs involving the musculoskeletal system  Chronic pain of left knee  Chronic pain of right knee  Chronic midline low back pain, with sciatica presence unspecified     Problem List Patient Active Problem List   Diagnosis Date Noted  . Acute osteomyelitis of left foot ([place])   . Diabetic foot ([place]) 02/02/2016  . Osteomyelitis ([place]) 02/02/2016  . Osteomyelitis of foot, acute, left 02/02/2016  . Diabetic foot infection ([place]) 02/02/2016  . CKD (chronic kidney disease), stage II 02/02/2016  . Anemia 02/02/2016  . Kidney stones 11/30/2013  . Cholelithiasis: PER CT ABD/PELVIS AND ABD [place] 11/30/13 11/30/2013  . Acute renal failure ([place]) 11/29/2013  . Acute pancreatitis 11/29/2013  . Asthma exacerbation 11/09/2013  . CAP (community acquired pneumonia) 07/18/2013  . Diabetes ([place]) 07/18/2013  . Hypertension     [place], [place], [place] [address], [place], [place] [address] [phone] 08/22/16, 3:46 PM   Name: Sebastion Jun MRN: [id number] Date of Birth: [birth year]

## 2016-08-25 ENCOUNTER — Ambulatory Visit: Payer: BLUE CROSS/BLUE SHIELD | Admitting: Physical Therapy

## 2016-08-25 ENCOUNTER — Encounter: Payer: Self-pay | Admitting: Physical Therapy

## 2016-08-25 DIAGNOSIS — M545 Low back pain: Secondary | ICD-10-CM

## 2016-08-25 DIAGNOSIS — M25562 Pain in left knee: Secondary | ICD-10-CM

## 2016-08-25 DIAGNOSIS — G8929 Other chronic pain: Secondary | ICD-10-CM

## 2016-08-25 DIAGNOSIS — R2681 Unsteadiness on feet: Secondary | ICD-10-CM

## 2016-08-25 DIAGNOSIS — R2689 Other abnormalities of gait and mobility: Secondary | ICD-10-CM

## 2016-08-25 DIAGNOSIS — R29898 Other symptoms and signs involving the musculoskeletal system: Secondary | ICD-10-CM

## 2016-08-25 DIAGNOSIS — M25561 Pain in right knee: Secondary | ICD-10-CM

## 2016-08-26 NOTE — Therapy (Signed)
[facility] [address] [place], [place], [address] Phone: [phone]   Fax:  [phone]  Physical Therapy Treatment  Patient Details  Name: Troy [name] MRN: [id number] Date of Birth: 1965-08-05 Referring Provider: Rodell Perna, MD  Encounter Date: 08/25/2016      PT End of Session - 08/25/16 1451    Visit Number 7   Number of Visits 20   Date for PT Re-Evaluation 09/26/16   Authorization Type [employer] 30 visit limit   Authorization - Visit Number 7   Authorization - Number of Visits 30   PT Start Time 8453   PT Stop Time 1530   PT Time Calculation (min) 42 min   Equipment Utilized During Treatment Gait belt   Activity Tolerance Patient tolerated treatment well   Behavior During Therapy [facility] for tasks assessed/performed      Past Medical History:  Diagnosis Date  . Asthma   . Bronchitis   . CKD (chronic kidney disease)   . Diabetes mellitus   . Diabetic Charcot foot ([place])    Left  . Gout   . Hypertension   . Kidney stones 11/30/2013  . Neuromuscular disorder ([place])   . Obesity   . Polyneuropathy in diabetes [facility])     Past Surgical History:  Procedure Laterality Date  . AMPUTATION Left 02/06/2016   Procedure: AMPUTATION BELOW KNEE;  Surgeon: Marybelle Killings, MD;  Location: [facility];  Service: Orthopedics;  Laterality: Left;  . CHOLECYSTECTOMY N/A 12/03/2013   Procedure: LAPAROSCOPIC CHOLECYSTECTOMY WITH INTRAOPERATIVE CHOLANGIOGRAM;  Surgeon: Rolm Bookbinder, MD;  Location: [facility];  Service: General;  Laterality: N/A;  . NO PAST SURGERIES      There were no vitals filed for this visit.      Subjective Assessment - 08/25/16 1451    Subjective No new complaints. No falls or pain to report.    Patient is accompained by: Family member   Pertinent History HTN, DM2, CKD stage II, asthma, gout   Limitations Lifting;Standing;Walking   Patient Stated Goals He wants to use prosthesis to get back in community,     Currently in Pain? No/denies   Pain Score 0-No pain            [facility] Adult PT Treatment/Exercise - 08/25/16 1452      Transfers   Transfers Sit to Stand;Stand to Sit   Sit to Stand 6: Modified independent (Device/Increase time);With upper extremity assist;From chair/3-in-1   Stand to Sit 6: Modified independent (Device/Increase time);With upper extremity assist;To chair/3-in-1     Ambulation/Gait   Ambulation/Gait Yes   Ambulation/Gait Assistance 4: Min guard;5: Supervision   Ambulation Distance (Feet) 240 Feet  x2, 115 x2   Assistive device Straight cane;Prosthesis  with rubber quad tip   Gait Pattern Step-through pattern;Decreased stride length;Decreased stance time - left;Decreased step length - right;Trunk flexed;Narrow base of support   Ambulation Surface Level;Indoor   Stairs Yes   Stairs Assistance 5: Supervision   Stair Management Technique One rail Right;One rail Left;Step to pattern;Forwards;With cane   Number of Stairs 4   x 2   Height of Stairs 6   Ramp 4: Min assist;Other (comment)  progressing to min guard assist   Ramp Details (indicate cue type and reason) x 3 reps   Curb 4: Min assist;Other (comment)  progressing to min guard assist   Curb Details (indicate cue type and reason) x 3 reps     High Level Balance   High Level  Balance Activities Side stepping;Backward walking   High Level Balance Comments with cane in parallel bars, intermittent touch to bars (mostly with backward walking). performed side stepping x 3 laps each way and backward walking x 4 laps.      Self-Care   Self-Care Other Self-Care Comments   Other Self-Care Comments  discussed walking program for home to work on increasing activity tolerance.      Neuro Re-ed    Neuro Re-ed Details  gait across red mat<>floor<>blue mat<>floor<>red mat with cane/prosthesis, min assit progressing to min guard assit     Prosthetics   Prosthetic Care Comments  pt to increase to 6 hours 2x day starting  today.   Current prosthetic wear tolerance (days/week)  daily   Current prosthetic wear tolerance (#hours/day)  5 hours 2x day   Residual limb condition  intact with no issues   Donning Prosthesis Supervision             PT Short Term Goals - 08/19/16 1456      PT SHORT TERM GOAL #1   Title Patient tolerates wear of prosthesis >/= 8 hrs total without wounds increasing in size and no residual limb pain. (Target Date: 08/22/2016)   Baseline 08/19/16: met today as wound is healed and pt wearing >/= 8 hours a day without residual limb pain   Status Achieved     PT SHORT TERM GOAL #2   Title Patient demonstrates proper donning & verbalizes proper cleaning of prosthesis. (Target Date: 08/22/2016)   Baseline 08/19/16: met today   Status Achieved     PT SHORT TERM GOAL #3   Title Patient stands without UE support 1 minute and reaches 5" anteriorly with supervision. (Target Date: 08/22/2016)   Baseline 08/19/16: met today   Status Achieved     PT SHORT TERM GOAL #4   Title Patient ambulates 200' including around obstacles & scanning environment with RW & prosthesis safely with cues only for deviations. (Target Date: 08/22/2016)   Baseline 08/19/16: met today   Time --   Period --   Status Achieved     PT SHORT TERM GOAL #5   Title Patient negotiates ramps & curbs with RW & stairs with 2 rails with prosthesis with supervision. (Target Date: 08/22/2016)   Baseline 08/19/16: met today   Time --   Period --   Status Achieved     PT SHORT TERM GOAL #6   Title Patient tolerates standing & gait activities for 5 minutes with chronic pain in knees & back increasing </= 4 increments on 0-10 scale. (Target Date: 08/22/2016)   Baseline 08/19/16: met today during session   Time --   Period --   Status Achieved           PT Long Term Goals - 07/31/16 1608      PT LONG TERM GOAL #1   Title Patient tolerates wear of prosthesis >90% of awake hours without skin issues or residual limb pain to  enable function during his day. (Target Date: 09/26/2016)   Time 10   Period Weeks   Status On-going     PT LONG TERM GOAL #2   Title Patient demonstrates & verbalizes proper prosthetic care to enable safe use of prosthesis for function. (Target Date: 09/26/2016)   Time 10   Period Weeks   Status On-going     PT LONG TERM GOAL #3   Title Patient reports pain in knees & back increases </= 2 increments on  0-10 scale with standing & gait activities. (Target Date: 09/26/2016)   Time 10   Period Weeks   Status On-going     PT LONG TERM GOAL #4   Title Berg Balance > 36/56 to indicate lower fall risk. (Target Date: 09/26/2016)   Time 10   Period Weeks   Status On-going     PT LONG TERM GOAL #5   Title Patient ambulates >400' outdoors with prosthesis & LRAD modified independent for community mobility. (Target Date: 09/26/2016)   Time 10   Period Weeks   Status On-going     PT LONG TERM GOAL #6   Title Patient negotiates ramps, curbs & stairs with prosthesis & LRAD modified independent to enable community access. (Target Date: 09/26/2016)   Time 10   Period Weeks   Status On-going           Plan - 08/25/16 1451    Clinical Impression Statement Today's session continued to address gait with cane/prosthesis with intiation of complaint surfaces  without any issues reported. Also began to address dynamic balance without issues reported. Pt does continue to have increase in back pain with increased activity that is relieved by seated rest breaks. Pt is planning to start a walking program at home with RW to work on activity tolerance at home. Pt is making steady progress toward goals and should benefit from continued PT to progress toward unmet goals.                 Rehab Potential Good   PT Frequency 2x / week   PT Duration Other (comment)  10 weeks   PT Treatment/Interventions ADLs/Self Care Home Management;DME Instruction;Gait training;Stair training;Functional mobility training;Therapeutic  activities;Therapeutic exercise;Balance training;Neuromuscular re-education;Patient/family education;Prosthetic Training   PT Next Visit Plan Review prosthetic care, Prosthetic gait with cane including barriers, balance activities   Consulted and Agree with Plan of Care Patient      Patient will benefit from skilled therapeutic intervention in order to improve the following deficits and impairments:  Abnormal gait, Decreased activity tolerance, Decreased balance, Decreased endurance, Decreased knowledge of precautions, Decreased knowledge of use of DME, Decreased mobility, Decreased strength, Impaired flexibility, Postural dysfunction, Prosthetic Dependency  Visit Diagnosis: Other abnormalities of gait and mobility  Unsteadiness on feet  Other symptoms and signs involving the musculoskeletal system  Chronic pain of left knee  Chronic pain of right knee  Chronic midline low back pain, with sciatica presence unspecified     Problem List Patient Active Problem List   Diagnosis Date Noted  . Acute osteomyelitis of left foot ([place])   . Diabetic foot ([place]) 02/02/2016  . Osteomyelitis ([place]) 02/02/2016  . Osteomyelitis of foot, acute, left 02/02/2016  . Diabetic foot infection ([place]) 02/02/2016  . CKD (chronic kidney disease), stage II 02/02/2016  . Anemia 02/02/2016  . Kidney stones 11/30/2013  . Cholelithiasis: PER CT ABD/PELVIS AND ABD [place] 11/30/13 11/30/2013  . Acute renal failure ([place]) 11/29/2013  . Acute pancreatitis 11/29/2013  . Asthma exacerbation 11/09/2013  . CAP (community acquired pneumonia) 07/18/2013  . Diabetes ([place]) 07/18/2013  . Hypertension     [place], [place], [place] [address], [place], [place] [address] [phone] 08/26/16, 5:33 PM   Name: Troy [name] MRN: [id number] Date of Birth: Jul 27, 1966

## 2016-08-27 ENCOUNTER — Encounter: Payer: Self-pay | Admitting: Physical Therapy

## 2016-08-27 ENCOUNTER — Ambulatory Visit: Payer: BLUE CROSS/BLUE SHIELD | Admitting: Physical Therapy

## 2016-08-27 DIAGNOSIS — R2689 Other abnormalities of gait and mobility: Secondary | ICD-10-CM | POA: Diagnosis not present

## 2016-08-27 DIAGNOSIS — R29898 Other symptoms and signs involving the musculoskeletal system: Secondary | ICD-10-CM

## 2016-08-27 DIAGNOSIS — R2681 Unsteadiness on feet: Secondary | ICD-10-CM

## 2016-08-27 NOTE — Therapy (Signed)
[facility] [address] [place], [place], [address] Phone: [phone]   Fax:  [phone]  Physical Therapy Treatment  Patient Details  Name: Troy [name] MRN: [id number] Date of Birth: 04/12/1966 Referring Provider: Rodell Perna, MD  Encounter Date: 08/27/2016      PT End of Session - 08/27/16 2013    Visit Number 8   Number of Visits 20   Date for PT Re-Evaluation 09/26/16   Authorization Type [employer] 30 visit limit   Authorization - Visit Number 8   Authorization - Number of Visits 30   PT Start Time 1450   PT Stop Time 1530   PT Time Calculation (min) 40 min   Equipment Utilized During Treatment Gait belt   Activity Tolerance Patient tolerated treatment well   Behavior During Therapy [facility] for tasks assessed/performed      Past Medical History:  Diagnosis Date  . Asthma   . Bronchitis   . CKD (chronic kidney disease)   . Diabetes mellitus   . Diabetic Charcot foot ([place])    Left  . Gout   . Hypertension   . Kidney stones 11/30/2013  . Neuromuscular disorder ([place])   . Obesity   . Polyneuropathy in diabetes [facility])     Past Surgical History:  Procedure Laterality Date  . AMPUTATION Left 02/06/2016   Procedure: AMPUTATION BELOW KNEE;  Surgeon: Marybelle Killings, MD;  Location: [facility];  Service: Orthopedics;  Laterality: Left;  . CHOLECYSTECTOMY N/A 12/03/2013   Procedure: LAPAROSCOPIC CHOLECYSTECTOMY WITH INTRAOPERATIVE CHOLANGIOGRAM;  Surgeon: Rolm Bookbinder, MD;  Location: [facility];  Service: General;  Laterality: N/A;  . NO PAST SURGERIES      There were no vitals filed for this visit.      Subjective Assessment - 08/27/16 1453    Subjective Wearing prosthesis 6 hrs 2x/day without issues since Monday (2 days ago). He donnes prosthesis in mornings up to 3 hrs after arising.    Pertinent History HTN, DM2, CKD stage II, asthma, gout   Limitations Lifting;Standing;Walking   Patient Stated Goals He wants to  use prosthesis to get back in community,    Currently in Pain? No/denies                         [facility] Adult PT Treatment/Exercise - 08/27/16 1450      Transfers   Transfers Sit to Stand;Stand to Sit   Sit to Stand 6: Modified independent (Device/Increase time);With upper extremity assist;From chair/3-in-1;With armrests   Stand to Sit 6: Modified independent (Device/Increase time);With upper extremity assist;To chair/3-in-1     Ambulation/Gait   Ambulation/Gait Yes   Ambulation/Gait Assistance 4: Min guard;5: Supervision   Ambulation/Gait Assistance Details verbal & tactile cues on upright posture & step width   Ambulation Distance (Feet) 250 Feet  250' X 1, 150' X 2   Assistive device Straight cane;Prosthesis  with rubber quad tip   Gait Pattern Step-through pattern;Decreased stride length;Decreased stance time - left;Decreased step length - right;Trunk flexed;Narrow base of support   Ambulation Surface Indoor;Level   Stairs Yes   Stairs Assistance 5: Supervision   Stairs Assistance Details (indicate cue type and reason) PT demo & instructed in reciprocal pattern with TTA prosthesis   Stair Management Technique Two rails;Alternating pattern;Forwards   Number of Stairs 4   x 2   Height of Stairs 6   Ramp 4: Min assist  cane & prosthesis   Ramp Details (  indicate cue type and reason) verbal & tactile cues on upright posture   Curb 4: Min assist  cane & prosthesis   Curb Details (indicate cue type and reason) cues on step thru technique & balance reaction     High Level Balance   High Level Balance Activities Figure 8 turns;Negotiating over obstacles;Backward walking   High Level Balance Comments PT demo, verbal & tactile cues on technique with prosthesis including step lenth changes, clearing prosthesis.      Self-Care   Self-Care Lifting   Lifting PT demo technique picking up objects from floor with TTA prosthesis. Pt return demo with min gaurd.    Other  Self-Care Comments  --     Neuro Re-ed    Neuro Re-ed Details  --     Prosthetics   Prosthetic Care Comments  PT instructed in use of cut-off sock proximally under liner for sweat control & distally over liner for increased volume management. PT instructed in signs of sweating & need to dry limb & liner. PT instructed in use of Antiperspirant.  PT instructed to donne upon arising or if bathing first thing immediately after bath.    Current prosthetic wear tolerance (days/week)  daily   Current prosthetic wear tolerance (#hours/day)  --   Residual limb condition  intact with no issues   Education Provided Skin check;Correct ply sock adjustment;Proper wear schedule/adjustment;Other (comment)  see prosthetic care   Person(s) Educated Patient   Education Method Explanation;Demonstration;Tactile cues;Verbal cues   Education Method Verbalized understanding;Returned demonstration;Tactile cues required;Verbal cues required;Needs further instruction                  PT Short Term Goals - 08/19/16 1456      PT SHORT TERM GOAL #1   Title Patient tolerates wear of prosthesis >/= 8 hrs total without wounds increasing in size and no residual limb pain. (Target Date: 08/22/2016)   Baseline 08/19/16: met today as wound is healed and pt wearing >/= 8 hours a day without residual limb pain   Status Achieved     PT SHORT TERM GOAL #2   Title Patient demonstrates proper donning & verbalizes proper cleaning of prosthesis. (Target Date: 08/22/2016)   Baseline 08/19/16: met today   Status Achieved     PT SHORT TERM GOAL #3   Title Patient stands without UE support 1 minute and reaches 5" anteriorly with supervision. (Target Date: 08/22/2016)   Baseline 08/19/16: met today   Status Achieved     PT SHORT TERM GOAL #4   Title Patient ambulates 200' including around obstacles & scanning environment with RW & prosthesis safely with cues only for deviations. (Target Date: 08/22/2016)   Baseline 08/19/16:  met today   Time --   Period --   Status Achieved     PT SHORT TERM GOAL #5   Title Patient negotiates ramps & curbs with RW & stairs with 2 rails with prosthesis with supervision. (Target Date: 08/22/2016)   Baseline 08/19/16: met today   Time --   Period --   Status Achieved     PT SHORT TERM GOAL #6   Title Patient tolerates standing & gait activities for 5 minutes with chronic pain in knees & back increasing </= 4 increments on 0-10 scale. (Target Date: 08/22/2016)   Baseline 08/19/16: met today during session   Time --   Period --   Status Achieved           PT Long Term  Goals - 07/31/16 1608      PT LONG TERM GOAL #1   Title Patient tolerates wear of prosthesis >90% of awake hours without skin issues or residual limb pain to enable function during his day. (Target Date: 09/26/2016)   Time 10   Period Weeks   Status On-going     PT LONG TERM GOAL #2   Title Patient demonstrates & verbalizes proper prosthetic care to enable safe use of prosthesis for function. (Target Date: 09/26/2016)   Time 10   Period Weeks   Status On-going     PT LONG TERM GOAL #3   Title Patient reports pain in knees & back increases </= 2 increments on 0-10 scale with standing & gait activities. (Target Date: 09/26/2016)   Time 10   Period Weeks   Status On-going     PT LONG TERM GOAL #4   Title Berg Balance > 36/56 to indicate lower fall risk. (Target Date: 09/26/2016)   Time 10   Period Weeks   Status On-going     PT LONG TERM GOAL #5   Title Patient ambulates >400' outdoors with prosthesis & LRAD modified independent for community mobility. (Target Date: 09/26/2016)   Time 10   Period Weeks   Status On-going     PT LONG TERM GOAL #6   Title Patient negotiates ramps, curbs & stairs with prosthesis & LRAD modified independent to enable community access. (Target Date: 09/26/2016)   Time 10   Period Weeks   Status On-going               Plan - 08/27/16 2014    Clinical Impression  Statement Patient improved prosthetic gait with cane with less assistance & less back pain. Patient progressed knee control with reciprocal gait on stairs with 2 rails for support. Patient is tolerating increased prosthesis wear with skilled instruction in care & use.    Rehab Potential Good   PT Frequency 2x / week   PT Duration Other (comment)  10 weeks   PT Treatment/Interventions ADLs/Self Care Home Management;DME Instruction;Gait training;Stair training;Functional mobility training;Therapeutic activities;Therapeutic exercise;Balance training;Neuromuscular re-education;Patient/family education;Prosthetic Training   PT Next Visit Plan Review prosthetic care, Prosthetic gait with cane including barriers, balance activities   Consulted and Agree with Plan of Care Patient      Patient will benefit from skilled therapeutic intervention in order to improve the following deficits and impairments:  Abnormal gait, Decreased activity tolerance, Decreased balance, Decreased endurance, Decreased knowledge of precautions, Decreased knowledge of use of DME, Decreased mobility, Decreased strength, Impaired flexibility, Postural dysfunction, Prosthetic Dependency  Visit Diagnosis: Other abnormalities of gait and mobility  Unsteadiness on feet  Other symptoms and signs involving the musculoskeletal system     Problem List Patient Active Problem List   Diagnosis Date Noted  . Acute osteomyelitis of left foot ([place])   . Diabetic foot ([place]) 02/02/2016  . Osteomyelitis ([place]) 02/02/2016  . Osteomyelitis of foot, acute, left 02/02/2016  . Diabetic foot infection ([place]) 02/02/2016  . CKD (chronic kidney disease), stage II 02/02/2016  . Anemia 02/02/2016  . Kidney stones 11/30/2013  . Cholelithiasis: PER CT ABD/PELVIS AND ABD [place] 11/30/13 11/30/2013  . Acute renal failure ([place]) 11/29/2013  . Acute pancreatitis 11/29/2013  . Asthma exacerbation 11/09/2013  . CAP (community acquired pneumonia) 07/18/2013   . Diabetes ([place]) 07/18/2013  . Hypertension     , PT, DPT 08/27/2016, 8:18 PM  [facility] [address]  [place], [place], [address] Phone: [phone]   Fax:  [phone]  Name: Troy [name] MRN: [id number] Date of Birth: 09/20/1965

## 2016-09-01 ENCOUNTER — Ambulatory Visit: Payer: BLUE CROSS/BLUE SHIELD | Admitting: Physical Therapy

## 2016-09-03 ENCOUNTER — Encounter: Payer: Self-pay | Admitting: Physical Therapy

## 2016-09-03 ENCOUNTER — Ambulatory Visit: Payer: BLUE CROSS/BLUE SHIELD | Attending: Orthopaedic Surgery | Admitting: Physical Therapy

## 2016-09-03 DIAGNOSIS — R29898 Other symptoms and signs involving the musculoskeletal system: Secondary | ICD-10-CM | POA: Insufficient documentation

## 2016-09-03 DIAGNOSIS — G8929 Other chronic pain: Secondary | ICD-10-CM | POA: Diagnosis present

## 2016-09-03 DIAGNOSIS — R2689 Other abnormalities of gait and mobility: Secondary | ICD-10-CM | POA: Insufficient documentation

## 2016-09-03 DIAGNOSIS — M25561 Pain in right knee: Secondary | ICD-10-CM | POA: Diagnosis present

## 2016-09-03 DIAGNOSIS — R2681 Unsteadiness on feet: Secondary | ICD-10-CM | POA: Diagnosis present

## 2016-09-03 DIAGNOSIS — M25562 Pain in left knee: Secondary | ICD-10-CM | POA: Diagnosis present

## 2016-09-03 DIAGNOSIS — M545 Low back pain: Secondary | ICD-10-CM | POA: Diagnosis present

## 2016-09-04 NOTE — Therapy (Signed)
[facility] [address] [place], [place], [address] Phone: [phone]   Fax:  [phone]  Physical Therapy Treatment  Patient Details  Name: Troy [name] MRN: [id number] Date of Birth: [birth year] Referring Provider: Rodell Perna, MD  Encounter Date: 09/03/2016      PT End of Session - 09/03/16 1453    Visit Number 9   Number of Visits 20   Date for PT Re-Evaluation 09/26/16   Authorization Type [employer] 30 visit limit   Authorization - Visit Number 9   Authorization - Number of Visits 30   PT Start Time 3545   PT Stop Time 1530   PT Time Calculation (min) 42 min   Equipment Utilized During Treatment Gait belt   Activity Tolerance Patient tolerated treatment well   Behavior During Therapy WFL for tasks assessed/performed      Past Medical History:  Diagnosis Date  . Asthma   . Bronchitis   . CKD (chronic kidney disease)   . Diabetes mellitus   . Diabetic Charcot foot ([place])    Left  . Gout   . Hypertension   . Kidney stones 11/30/2013  . Neuromuscular disorder ([place])   . Obesity   . Polyneuropathy in diabetes [facility])     Past Surgical History:  Procedure Laterality Date  . AMPUTATION Left 02/06/2016   Procedure: AMPUTATION BELOW KNEE;  Surgeon: Marybelle Killings, MD;  Location: [place];  Service: Orthopedics;  Laterality: Left;  . CHOLECYSTECTOMY N/A 12/03/2013   Procedure: LAPAROSCOPIC CHOLECYSTECTOMY WITH INTRAOPERATIVE CHOLANGIOGRAM;  Surgeon: Rolm Bookbinder, MD;  Location: [place];  Service: General;  Laterality: N/A;  . NO PAST SURGERIES      There were no vitals filed for this visit.      Subjective Assessment - 09/03/16 1449    Subjective No new complaints. Purchased his cane last Thursday and has been using it since then. Having some back and leg pain today, was worse this am however he took a muscle relaxer and it feels better now.    Patient is accompained by: Family member   Pertinent History HTN,  DM2, CKD stage II, asthma, gout   Limitations Lifting;Standing;Walking   Patient Stated Goals He wants to use prosthesis to get back in community,    Currently in Pain? Yes   Pain Score 2    Pain Location Generalized  back and left leg   Pain Descriptors / Indicators Aching   Pain Type Chronic pain   Pain Onset More than a month ago   Pain Frequency Intermittent   Aggravating Factors  increased activity, increased weight bearing on prosthesis with use of cane   Pain Relieving Factors rest, muscle relaxer           [facility] Adult PT Treatment/Exercise - 09/03/16 1455      Transfers   Transfers Sit to Stand;Stand to Sit   Sit to Stand 6: Modified independent (Device/Increase time);With upper extremity assist;From chair/3-in-1;With armrests   Stand to Sit 6: Modified independent (Device/Increase time);With upper extremity assist;To chair/3-in-1   Comments gait along ~50 foot pathway with straight cane; forward gait with head movemments left<.right x 4 laps and up<>down x 2 laps with min guard to min assist for balance.      Ambulation/Gait   Ambulation/Gait Yes   Ambulation/Gait Assistance 4: Min guard;5: Supervision   Ambulation/Gait Assistance Details cues on posture and step length   Ambulation Distance (Feet) 345 Feet  x1, 130 x2  Assistive device Straight cane;Prosthesis   Gait Pattern Step-through pattern;Decreased stride length;Decreased stance time - left;Decreased step length - right;Trunk flexed;Narrow base of support   Ambulation Surface Level;Indoor   Stairs Yes   Stairs Assistance 4: Min assist;5: Supervision   Stairs Assistance Details (indicate cue type and reason) increased assist neded with rail/cane combo   Stair Management Technique One rail Right;Two rails;Alternating pattern;Forwards;With cane   Number of Stairs 4  x 6 reps   Ramp 4: Min assist  progressing to min guard assist   Ramp Details (indicate cue type and reason) demo technique prior to pt  performing x 4 reps with cues on sequencing, technique and step length   Curb 4: Min assist  progressing to min guard assist on 2cd rep   Curb Details (indicate cue type and reason) cues on technique and stance position. performed x 2 reps     High Level Balance   High Level Balance Activities Side stepping;Backward walking   High Level Balance Comments in parallel bars with cues on posture and weight shifting with single UE support. 3-4 laps each.     Prosthetics   Current prosthetic wear tolerance (days/week)  daily   Current prosthetic wear tolerance (#hours/day)  6 hours x 2 a day   Residual limb condition  noticed a blister area on Saturday. area checked, not open and pin point in size. Will continue to monitor each session. Pt advised if it opens or gets bigger to resume use of Tegaderm (has some left from last wound)    Education Provided Residual limb care;Proper wear schedule/adjustment;Proper weight-bearing schedule/adjustment   Person(s) Educated Patient   Education Method Explanation;Demonstration;Verbal cues   Education Method Verbalized understanding;Needs further instruction   Donning Prosthesis Supervision   Doffing Prosthesis Supervision            PT Short Term Goals - 08/19/16 1456      PT SHORT TERM GOAL #1   Title Patient tolerates wear of prosthesis >/= 8 hrs total without wounds increasing in size and no residual limb pain. (Target Date: 08/22/2016)   Baseline 08/19/16: met today as wound is healed and pt wearing >/= 8 hours a day without residual limb pain   Status Achieved     PT SHORT TERM GOAL #2   Title Patient demonstrates proper donning & verbalizes proper cleaning of prosthesis. (Target Date: 08/22/2016)   Baseline 08/19/16: met today   Status Achieved     PT SHORT TERM GOAL #3   Title Patient stands without UE support 1 minute and reaches 5" anteriorly with supervision. (Target Date: 08/22/2016)   Baseline 08/19/16: met today   Status Achieved      PT SHORT TERM GOAL #4   Title Patient ambulates 200' including around obstacles & scanning environment with RW & prosthesis safely with cues only for deviations. (Target Date: 08/22/2016)   Baseline 08/19/16: met today   Time --   Period --   Status Achieved     PT SHORT TERM GOAL #5   Title Patient negotiates ramps & curbs with RW & stairs with 2 rails with prosthesis with supervision. (Target Date: 08/22/2016)   Baseline 08/19/16: met today   Time --   Period --   Status Achieved     PT SHORT TERM GOAL #6   Title Patient tolerates standing & gait activities for 5 minutes with chronic pain in knees & back increasing </= 4 increments on 0-10 scale. (Target Date: 08/22/2016)   Baseline  08/19/16: met today during session   Time --   Period --   Status Achieved           PT Long Term Goals - 07/31/16 1608      PT LONG TERM GOAL #1   Title Patient tolerates wear of prosthesis >90% of awake hours without skin issues or residual limb pain to enable function during his day. (Target Date: 09/26/2016)   Time 10   Period Weeks   Status On-going     PT LONG TERM GOAL #2   Title Patient demonstrates & verbalizes proper prosthetic care to enable safe use of prosthesis for function. (Target Date: 09/26/2016)   Time 10   Period Weeks   Status On-going     PT LONG TERM GOAL #3   Title Patient reports pain in knees & back increases </= 2 increments on 0-10 scale with standing & gait activities. (Target Date: 09/26/2016)   Time 10   Period Weeks   Status On-going     PT LONG TERM GOAL #4   Title Berg Balance > 36/56 to indicate lower fall risk. (Target Date: 09/26/2016)   Time 10   Period Weeks   Status On-going     PT LONG TERM GOAL #5   Title Patient ambulates >400' outdoors with prosthesis & LRAD modified independent for community mobility. (Target Date: 09/26/2016)   Time 10   Period Weeks   Status On-going     PT LONG TERM GOAL #6   Title Patient negotiates ramps, curbs & stairs with  prosthesis & LRAD modified independent to enable community access. (Target Date: 09/26/2016)   Time 10   Period Weeks   Status On-going            Plan - 09/03/16 1454    Clinical Impression Statement Today's session continued to focus on gait/balance with prosthesis/cane with pt making steady progress toward goals. Pt should benefit from continued PT to progress toward unmet goals.    Rehab Potential Good   PT Frequency 2x / week   PT Duration Other (comment)  10 weeks   PT Treatment/Interventions ADLs/Self Care Home Management;DME Instruction;Gait training;Stair training;Functional mobility training;Therapeutic activities;Therapeutic exercise;Balance training;Neuromuscular re-education;Patient/family education;Prosthetic Training   PT Next Visit Plan Review prosthetic care, Prosthetic gait with cane including barriers, balance activities   Consulted and Agree with Plan of Care Patient      Patient will benefit from skilled therapeutic intervention in order to improve the following deficits and impairments:  Abnormal gait, Decreased activity tolerance, Decreased balance, Decreased endurance, Decreased knowledge of precautions, Decreased knowledge of use of DME, Decreased mobility, Decreased strength, Impaired flexibility, Postural dysfunction, Prosthetic Dependency  Visit Diagnosis: Other abnormalities of gait and mobility  Unsteadiness on feet  Other symptoms and signs involving the musculoskeletal system  Chronic pain of left knee  Chronic pain of right knee  Chronic midline low back pain, with sciatica presence unspecified     Problem List Patient Active Problem List   Diagnosis Date Noted  . Acute osteomyelitis of left foot ([place])   . Diabetic foot ([place]) 02/02/2016  . Osteomyelitis ([place]) 02/02/2016  . Osteomyelitis of foot, acute, left 02/02/2016  . Diabetic foot infection ([place]) 02/02/2016  . CKD (chronic kidney disease), stage II 02/02/2016  . Anemia 02/02/2016   . Kidney stones 11/30/2013  . Cholelithiasis: PER CT ABD/PELVIS AND ABD [place] 11/30/13 11/30/2013  . Acute renal failure ([place]) 11/29/2013  . Acute pancreatitis 11/29/2013  . Asthma exacerbation 11/09/2013  .  CAP (community acquired pneumonia) 07/18/2013  . Diabetes ([place]) 07/18/2013  . Hypertension     [place], [place], [place] [address] [place], [place] [address] [phone] 09/04/16, 1:51 PM   Name: Jadis Mika MRN: [id number] Date of Birth: [birth year]

## 2016-09-08 ENCOUNTER — Ambulatory Visit: Payer: BLUE CROSS/BLUE SHIELD | Admitting: Physical Therapy

## 2016-09-08 ENCOUNTER — Encounter: Payer: Self-pay | Admitting: Physical Therapy

## 2016-09-08 DIAGNOSIS — M545 Low back pain: Secondary | ICD-10-CM

## 2016-09-08 DIAGNOSIS — R29898 Other symptoms and signs involving the musculoskeletal system: Secondary | ICD-10-CM

## 2016-09-08 DIAGNOSIS — R2681 Unsteadiness on feet: Secondary | ICD-10-CM

## 2016-09-08 DIAGNOSIS — R2689 Other abnormalities of gait and mobility: Secondary | ICD-10-CM

## 2016-09-08 DIAGNOSIS — M25561 Pain in right knee: Secondary | ICD-10-CM

## 2016-09-08 DIAGNOSIS — G8929 Other chronic pain: Secondary | ICD-10-CM

## 2016-09-08 DIAGNOSIS — M25562 Pain in left knee: Secondary | ICD-10-CM

## 2016-09-09 NOTE — Therapy (Signed)
[facility] [address] [place], [place], [address] Phone: [phone]   Fax:  [phone]  Physical Therapy Treatment  Patient Details  Name: Troy [name] MRN: [id number] Date of Birth: 07-05-1966 Referring Provider: Rodell Perna, MD  Encounter Date: 09/08/2016      PT End of Session - 09/08/16 1454    Visit Number 10   Number of Visits 20   Date for PT Re-Evaluation 09/26/16   Authorization Type [employer] 30 visit limit   Authorization - Visit Number 10   Authorization - Number of Visits 30   PT Start Time 1324   PT Stop Time 1530   PT Time Calculation (min) 42 min   Equipment Utilized During Treatment Gait belt   Activity Tolerance Patient tolerated treatment well   Behavior During Therapy WFL for tasks assessed/performed      Past Medical History:  Diagnosis Date  . Asthma   . Bronchitis   . CKD (chronic kidney disease)   . Diabetes mellitus   . Diabetic Charcot foot ([place])    Left  . Gout   . Hypertension   . Kidney stones 11/30/2013  . Neuromuscular disorder ([place])   . Obesity   . Polyneuropathy in diabetes [facility])     Past Surgical History:  Procedure Laterality Date  . AMPUTATION Left 02/06/2016   Procedure: AMPUTATION BELOW KNEE;  Surgeon: Marybelle Killings, MD;  Location: [place];  Service: Orthopedics;  Laterality: Left;  . CHOLECYSTECTOMY N/A 12/03/2013   Procedure: LAPAROSCOPIC CHOLECYSTECTOMY WITH INTRAOPERATIVE CHOLANGIOGRAM;  Surgeon: Rolm Bookbinder, MD;  Location: [place];  Service: General;  Laterality: N/A;  . NO PAST SURGERIES      There were no vitals filed for this visit.      Subjective Assessment - 09/08/16 1450    Subjective No new complaints. No falls to report. Saw prosthetist Friday and had adjustments in height made. No pain currently, however did have some right hip pain. Reports prosthetist took heel lift out of his right shoe which caused hip to hurt. He has since placed it back in  shoe with pain resolving.    Patient is accompained by: Family member   Pertinent History HTN, DM2, CKD stage II, asthma, gout   Limitations Lifting;Standing;Walking   Patient Stated Goals He wants to use prosthesis to get back in community,    Currently in Pain? No/denies   Pain Score 0-No pain              [facility] Adult PT Treatment/Exercise - 09/08/16 1454      Transfers   Transfers Sit to Stand;Stand to Sit   Sit to Stand 6: Modified independent (Device/Increase time);With upper extremity assist;From chair/3-in-1;With armrests   Stand to Sit 6: Modified independent (Device/Increase time);With upper extremity assist;To chair/3-in-1     Ambulation/Gait   Ambulation/Gait Yes   Ambulation/Gait Assistance 4: Min guard;5: Supervision   Ambulation/Gait Assistance Details occasional cues on posture, more narrowed base of support and step length with gait   Ambulation Distance (Feet) 420 Feet  x1   Assistive device Straight cane;Prosthesis   Gait Pattern Step-through pattern;Decreased stride length;Decreased stance time - left;Decreased step length - right;Trunk flexed;Narrow base of support   Ambulation Surface Level;Indoor   Ramp Other (comment)  min guard assist with cane/prosthesis   Ramp Details (indicate cue type and reason) x 2 reps with cues on technique   Curb Other (comment)  min guard assist with cane/prosthesis   Curb Details (  indicate cue type and reason) x 2 reps with cues on stance position for improved balance and cane position to assist with balance     High Level Balance   High Level Balance Activities Side stepping;Backward walking   High Level Balance Comments in parallel bars with cues on posture and weight shifting with cane only,  3-4 laps each with min guard to min assist for balance: forward stepping over 4 bolsters of varied heights with cane/prosthesis with min guard to min assist for balance.      Prosthetics   Current prosthetic wear tolerance  (days/week)  daily   Current prosthetic wear tolerance (#hours/day)  6 hours x 2 a day   Residual limb condition  blister area now has scab on it, otherwise skin is intact   Education Provided Residual limb care;Proper wear schedule/adjustment;Proper weight-bearing schedule/adjustment   Person(s) Educated Patient   Education Method Explanation;Demonstration;Handout   Education Method Verbalized understanding;Needs further instruction;Verbal cues required   Donning Prosthesis Supervision   Doffing Prosthesis Supervision            Balance Exercises - 09/08/16 1515      Balance Exercises: Standing   Standing Eyes Opened Wide ([place]);Foam/compliant surface;Head turns;Other reps (comment);Limitations   Standing Eyes Closed Wide ([place]);Foam/compliant surface;Other reps (comment);20 secs;Limitations     Balance Exercises: Standing   Standing Eyes Opened Limitations on airex in parallel bars: normal base of support with EO head movements up<>down and left<>right with min to mod assist for balance, no UE support.    Standing Eyes Closed Limitations on airex: with normal base of support: EC no head movments for 20 sec's x 2 reps with min assist for balance, no UE support.              PT Short Term Goals - 08/19/16 1456      PT SHORT TERM GOAL #1   Title Patient tolerates wear of prosthesis >/= 8 hrs total without wounds increasing in size and no residual limb pain. (Target Date: 08/22/2016)   Baseline 08/19/16: met today as wound is healed and pt wearing >/= 8 hours a day without residual limb pain   Status Achieved     PT SHORT TERM GOAL #2   Title Patient demonstrates proper donning & verbalizes proper cleaning of prosthesis. (Target Date: 08/22/2016)   Baseline 08/19/16: met today   Status Achieved     PT SHORT TERM GOAL #3   Title Patient stands without UE support 1 minute and reaches 5" anteriorly with supervision. (Target Date: 08/22/2016)   Baseline 08/19/16: met today   Status  Achieved     PT SHORT TERM GOAL #4   Title Patient ambulates 200' including around obstacles & scanning environment with RW & prosthesis safely with cues only for deviations. (Target Date: 08/22/2016)   Baseline 08/19/16: met today   Time --   Period --   Status Achieved     PT SHORT TERM GOAL #5   Title Patient negotiates ramps & curbs with RW & stairs with 2 rails with prosthesis with supervision. (Target Date: 08/22/2016)   Baseline 08/19/16: met today   Time --   Period --   Status Achieved     PT SHORT TERM GOAL #6   Title Patient tolerates standing & gait activities for 5 minutes with chronic pain in knees & back increasing </= 4 increments on 0-10 scale. (Target Date: 08/22/2016)   Baseline 08/19/16: met today during session   Time --  Period --   Status Achieved           PT Long Term Goals - 07/31/16 1608      PT LONG TERM GOAL #1   Title Patient tolerates wear of prosthesis >90% of awake hours without skin issues or residual limb pain to enable function during his day. (Target Date: 09/26/2016)   Time 10   Period Weeks   Status On-going     PT LONG TERM GOAL #2   Title Patient demonstrates & verbalizes proper prosthetic care to enable safe use of prosthesis for function. (Target Date: 09/26/2016)   Time 10   Period Weeks   Status On-going     PT LONG TERM GOAL #3   Title Patient reports pain in knees & back increases </= 2 increments on 0-10 scale with standing & gait activities. (Target Date: 09/26/2016)   Time 10   Period Weeks   Status On-going     PT LONG TERM GOAL #4   Title Berg Balance > 36/56 to indicate lower fall risk. (Target Date: 09/26/2016)   Time 10   Period Weeks   Status On-going     PT LONG TERM GOAL #5   Title Patient ambulates >400' outdoors with prosthesis & LRAD modified independent for community mobility. (Target Date: 09/26/2016)   Time 10   Period Weeks   Status On-going     PT LONG TERM GOAL #6   Title Patient negotiates ramps, curbs &  stairs with prosthesis & LRAD modified independent to enable community access. (Target Date: 09/26/2016)   Time 10   Period Weeks   Status On-going           Plan - 09/08/16 1454    Clinical Impression Statement Today's skilled session continued to focus on gait/balance with prosthesis/cane. Pt needed less assistance on ramp/curb today and was challenged by balance activities on compliant surfaces. Pt is making steady progress toward goals and should benefit from continued PT to progress toward unmet goals .    Rehab Potential Good   PT Frequency 2x / week   PT Duration Other (comment)  10 weeks   PT Treatment/Interventions ADLs/Self Care Home Management;DME Instruction;Gait training;Stair training;Functional mobility training;Therapeutic activities;Therapeutic exercise;Balance training;Neuromuscular re-education;Patient/family education;Prosthetic Training   PT Next Visit Plan Review prosthetic care, Prosthetic gait with cane including barriers, balance activities   Consulted and Agree with Plan of Care Patient      Patient will benefit from skilled therapeutic intervention in order to improve the following deficits and impairments:  Abnormal gait, Decreased activity tolerance, Decreased balance, Decreased endurance, Decreased knowledge of precautions, Decreased knowledge of use of DME, Decreased mobility, Decreased strength, Impaired flexibility, Postural dysfunction, Prosthetic Dependency  Visit Diagnosis: Other abnormalities of gait and mobility  Unsteadiness on feet  Other symptoms and signs involving the musculoskeletal system  Chronic pain of left knee  Chronic pain of right knee  Chronic midline low back pain, with sciatica presence unspecified     Problem List Patient Active Problem List   Diagnosis Date Noted  . Acute osteomyelitis of left foot ([place])   . Diabetic foot ([place]) 02/02/2016  . Osteomyelitis ([place]) 02/02/2016  . Osteomyelitis of foot, acute, left  02/02/2016  . Diabetic foot infection ([place]) 02/02/2016  . CKD (chronic kidney disease), stage II 02/02/2016  . Anemia 02/02/2016  . Kidney stones 11/30/2013  . Cholelithiasis: PER CT ABD/PELVIS AND ABD [place] 11/30/13 11/30/2013  . Acute renal failure ([place]) 11/29/2013  . Acute pancreatitis  11/29/2013  . Asthma exacerbation 11/09/2013  . CAP (community acquired pneumonia) 07/18/2013  . Diabetes ([place]) 07/18/2013  . Hypertension     [place], [place], [place] [address], [place], [place] [address] [phone] 09/09/16, 10:55 AM   Name: Troy [name] MRN: [id number] Date of Birth: May 20, 1966

## 2016-09-10 ENCOUNTER — Encounter: Payer: Self-pay | Admitting: Physical Therapy

## 2016-09-11 ENCOUNTER — Other Ambulatory Visit: Payer: BLUE CROSS/BLUE SHIELD

## 2016-09-12 ENCOUNTER — Encounter (HOSPITAL_COMMUNITY): Payer: Self-pay | Admitting: Emergency Medicine

## 2016-09-12 ENCOUNTER — Ambulatory Visit: Payer: BLUE CROSS/BLUE SHIELD | Admitting: Physical Therapy

## 2016-09-12 ENCOUNTER — Ambulatory Visit (HOSPITAL_COMMUNITY)
Admission: EM | Admit: 2016-09-12 | Discharge: 2016-09-12 | Disposition: A | Discharge status: Home / Self Care | Payer: BLUE CROSS/BLUE SHIELD | Attending: Family Medicine | Admitting: Family Medicine

## 2016-09-12 DIAGNOSIS — J069 Acute upper respiratory infection, unspecified: Secondary | ICD-10-CM | POA: Diagnosis not present

## 2016-09-12 DIAGNOSIS — S81801A Unspecified open wound, right lower leg, initial encounter: Secondary | ICD-10-CM | POA: Diagnosis not present

## 2016-09-12 DIAGNOSIS — J40 Bronchitis, not specified as acute or chronic: Secondary | ICD-10-CM

## 2016-09-12 DIAGNOSIS — S61202A Unspecified open wound of right middle finger without damage to nail, initial encounter: Secondary | ICD-10-CM | POA: Diagnosis not present

## 2016-09-12 DIAGNOSIS — B9789 Other viral agents as the cause of diseases classified elsewhere: Secondary | ICD-10-CM

## 2016-09-12 MED ORDER — DOXYCYCLINE HYCLATE 100 MG PO TABS: 100.0000 mg | ORAL_TABLET | Freq: Two times a day (BID) | ORAL | 0 refills | Status: DC

## 2016-09-12 MED ORDER — MUPIROCIN 2 % EX OINT: 1.0000 "application " | TOPICAL_OINTMENT | Freq: Three times a day (TID) | CUTANEOUS | 1 refills | Status: DC

## 2016-09-12 NOTE — ED Triage Notes (Signed)
Pt c/o cold sx onset: this am   Sx include: chills, nasal congestion/drainage, wheezing,   Denies: fevers  Taking: OTC cold meds w/temp relief.   Also reports a blister on 3rd digit of right hand popped yest and it drained.   A&O x4... NAD

## 2016-09-12 NOTE — ED Provider Notes (Signed)
[name]    CSN: [id number] Arrival date & time: 09/12/16  1400     History   Chief Complaint Chief Complaint  Patient presents with  . URI    HPI Troy [name] is a 51 y.o. male.   This a 51 year old disabled man who comes in with several problems. He recently had an B-K amputation on the left.  Patient complains of 2 days of nasal congestion, wheezing, and some mild lower extremity edema. He's been using his inhaler which has helped him some. He's had no fever.    patient also has an open sore on his right lateral calf. This has been ongoing and his wife says it looks better today and has in recent days. Nevertheless it still draining some fluid.  Patient also has an open blister on his right middle finger, dorsal aspect from the PIP to the DIP joint. This opened earlier today. In the past weeks had this its developed into a boil and cellulitis, the same problem that resulted in his B-K amputation on the left.      Past Medical History:  Diagnosis Date  . Asthma   . Bronchitis   . CKD (chronic kidney disease)   . Diabetes mellitus   . Diabetic Charcot foot ([place])    Left  . Gout   . Hypertension   . Kidney stones 11/30/2013  . Neuromuscular disorder ([place])   . Obesity   . Polyneuropathy in diabetes [facility])     Patient Active Problem List   Diagnosis Date Noted  . Acute osteomyelitis of left foot ([place])   . Diabetic foot ([place]) 02/02/2016  . Osteomyelitis ([place]) 02/02/2016  . Osteomyelitis of foot, acute, left 02/02/2016  . Diabetic foot infection ([place]) 02/02/2016  . CKD (chronic kidney disease), stage II 02/02/2016  . Anemia 02/02/2016  . Kidney stones 11/30/2013  . Cholelithiasis: PER CT ABD/PELVIS AND ABD [place] 11/30/13 11/30/2013  . Acute renal failure ([place]) 11/29/2013  . Acute pancreatitis 11/29/2013  . Asthma exacerbation 11/09/2013  . CAP (community acquired pneumonia) 07/18/2013  . Diabetes ([place]) 07/18/2013  . Hypertension     Past  Surgical History:  Procedure Laterality Date  . AMPUTATION Left 02/06/2016   Procedure: AMPUTATION BELOW KNEE;  Surgeon: Marybelle Killings, MD;  Location: [place];  Service: Orthopedics;  Laterality: Left;  . CHOLECYSTECTOMY N/A 12/03/2013   Procedure: LAPAROSCOPIC CHOLECYSTECTOMY WITH INTRAOPERATIVE CHOLANGIOGRAM;  Surgeon: Rolm Bookbinder, MD;  Location: [place];  Service: General;  Laterality: N/A;  . NO PAST SURGERIES         Home Medications    Prior to Admission medications   Medication Sig Start Date End Date Taking? Authorizing Provider  acetaminophen (TYLENOL) 500 MG tablet Take 1,000 mg by mouth every 6 (six) hours as needed for moderate pain.   Yes Historical Provider, MD  albuterol (PROVENTIL HFA;VENTOLIN HFA) 108 (90 Base) MCG/ACT inhaler Inhale 1-2 puffs into the lungs every 6 (six) hours as needed for wheezing or shortness of breath. 08/25/15  Yes Heather Laisure, PA-C  aspirin (ASPIRIN EC) 81 MG EC tablet Take 81 mg by mouth daily. Swallow whole.   Yes Historical Provider, MD  BREO ELLIPTA 100-25 MCG/INH AEPB Inhale 1 puff into the lungs daily.  07/31/15  Yes Historical Provider, MD  cyclobenzaprine (FLEXERIL) 10 MG tablet Take 0.5-1 tablets (5-10 mg total) by mouth 2 (two) times daily as needed. 06/03/15  Yes Tiffany Carlota Raspberry, PA-C  fluticasone (FLONASE) 50 MCG/ACT nasal spray Place 2 sprays  into both nostrils daily. [id number]  Yes Delora Fuel, MD  glimepiride (AMARYL) 4 MG tablet Take 4 mg by mouth daily with breakfast.   Yes Historical Provider, MD  LANTUS SOLOSTAR 100 UNIT/ML Solostar Pen Inject 10 Units into the skin daily. 01/10/16  Yes Historical Provider, MD  lisinopril-hydrochlorothiazide (PRINZIDE,ZESTORETIC) 10-12.5 MG tablet  05/06/16  Yes Historical Provider, MD  metFORMIN (GLUCOPHAGE) 500 MG tablet Take 1,000 mg by mouth 2 (two) times daily.  06/29/14  Yes Historical Provider, MD  pantoprazole (PROTONIX) 40 MG tablet  06/11/16  Yes Historical Provider, MD  VIAGRA 100 MG tablet  Take 100 mg by mouth as needed for erectile dysfunction.  06/19/14  Yes Historical Provider, MD  albuterol (PROVENTIL) (2.5 MG/3ML) 0.083% nebulizer solution  06/11/16   Historical Provider, MD  cetirizine (ZYRTEC) 10 MG tablet Take 10 mg by mouth daily.    Historical Provider, MD  doxycycline (VIBRA-TABS) 100 MG tablet Take 1 tablet (100 mg total) by mouth 2 (two) times daily. 09/12/16   Troy Haber, MD  mupirocin ointment (BACTROBAN) 2 % Apply 1 application topically 3 (three) times daily. 09/12/16   Troy Haber, MD    Family History Family History  Problem Relation Age of Onset  . Diabetes Mellitus II Father   . Diabetes Father   . Hypertension Father   . Diabetes Mother   . Hypertension Mother   . CAD Neg Hx     Social History Social History  Substance Use Topics  . Smoking status: Never Smoker  . Smokeless tobacco: Never Used  . Alcohol use Yes     Comment: occasionally     Allergies   No known allergies   Review of Systems Review of Systems  Constitutional: Negative.   HENT: Positive for congestion and sinus pressure.   Respiratory: Positive for cough and wheezing.   Cardiovascular: Positive for leg swelling. Negative for chest pain.  Gastrointestinal: Negative.   Genitourinary: Negative.   Musculoskeletal: Negative.   Skin: Positive for wound.     Physical Exam Triage Vital Signs ED Triage Vitals [09/12/16 1518]  Enc Vitals Group     BP 114/77     Pulse Rate 94     Resp 16     Temp 98 F (36.7 C)     Temp Source Oral     SpO2 97 %     Weight      Height      Head Circumference      Peak Flow      Pain Score      Pain Loc      Pain Edu?      Excl. in [place]?    No data found.   Updated Vital Signs BP 114/77 (BP Location: Left Arm)   Pulse 94   Temp 98 F (36.7 C) (Oral)   Resp 16   SpO2 97%    Physical Exam  Constitutional: He is oriented to person, place, and time. He appears well-developed and well-nourished.  HENT:  Head:  Normocephalic.  Right Ear: External ear normal.  Left Ear: External ear normal.  Mouth/Throat: Oropharynx is clear and moist.  Eyes: Conjunctivae and EOM are normal. Pupils are equal, round, and reactive to light.  Neck: Normal range of motion. Neck supple.  Cardiovascular: Normal rate, regular rhythm and normal heart sounds.   Pulmonary/Chest: Effort normal. He has wheezes.  Musculoskeletal: Normal range of motion.  Neurological: He is alert and oriented to person, place, and time.  Skin: Skin is warm.  Patient has a 1/2 cm open wound, crusting and flap-like in the posterior lateral right lower extremity. There is no surrounding erythema. That's nontender. There is some slight drainage.  Patient has a denuded blister on the dorsal aspect of his right middle finger from the DIP or PIP joint.  Nursing note and vitals reviewed.    UC Treatments / Results  Labs (all labs ordered are listed, but only abnormal results are displayed) Labs Reviewed - No data to display  EKG  EKG Interpretation None       Radiology No results found.  Procedures Procedures (including critical care time)  Medications Ordered in UC Medications - No data to display   Initial Impression / Assessment and Plan / UC Course  I have reviewed the triage vital signs and the nursing notes.  Pertinent labs & imaging results that were available during my care of the patient were reviewed by me and considered in my medical decision making (see chart for details).     Final Clinical Impressions(s) / UC Diagnoses   Final diagnoses:  Viral upper respiratory tract infection  Bronchitis  Open wound of right lower leg, initial encounter  Open wound of right middle finger without damage to nail, initial encounter    New Prescriptions New Prescriptions   DOXYCYCLINE (VIBRA-TABS) 100 MG TABLET    Take 1 tablet (100 mg total) by mouth 2 (two) times daily.   MUPIROCIN OINTMENT (BACTROBAN) 2 %    Apply 1  application topically 3 (three) times daily.     Troy Haber, MD 09/12/16 [phone]

## 2016-09-12 NOTE — Discharge Instructions (Signed)
Continue to wash the wounds on your right leg and right middle finger several times a day with soapy water. Then apply the ointment prescribed. Remember to take your antibiotic twice a day.   continue your nebulizer treatments at least 3 times a day.  Follow-up through your primary care doctor next Wednesday

## 2016-09-15 ENCOUNTER — Ambulatory Visit: Payer: BLUE CROSS/BLUE SHIELD | Admitting: Physical Therapy

## 2016-09-17 ENCOUNTER — Encounter: Payer: Self-pay | Admitting: Physical Therapy

## 2016-09-18 ENCOUNTER — Encounter: Payer: Self-pay | Admitting: Physical Therapy

## 2016-09-18 ENCOUNTER — Ambulatory Visit: Payer: BLUE CROSS/BLUE SHIELD | Admitting: Physical Therapy

## 2016-09-18 DIAGNOSIS — R2681 Unsteadiness on feet: Secondary | ICD-10-CM

## 2016-09-18 DIAGNOSIS — M25562 Pain in left knee: Secondary | ICD-10-CM

## 2016-09-18 DIAGNOSIS — G8929 Other chronic pain: Secondary | ICD-10-CM

## 2016-09-18 DIAGNOSIS — M545 Low back pain: Secondary | ICD-10-CM

## 2016-09-18 DIAGNOSIS — R2689 Other abnormalities of gait and mobility: Secondary | ICD-10-CM

## 2016-09-18 DIAGNOSIS — M25561 Pain in right knee: Secondary | ICD-10-CM

## 2016-09-18 DIAGNOSIS — R29898 Other symptoms and signs involving the musculoskeletal system: Secondary | ICD-10-CM

## 2016-09-19 NOTE — Therapy (Signed)
[facility] [address] [place], [place], [address] Phone: [phone]   Fax:  [phone]  Physical Therapy Treatment  Patient Details  Name: Troy [name] MRN: [id number] Date of Birth: 10/21/1965 Referring Provider: Rodell Perna, MD  Encounter Date: 09/18/2016      PT End of Session - 09/18/16 2126    Visit Number 11   Number of Visits 20   Date for PT Re-Evaluation 09/26/16   Authorization Type [employer] 30 visit limit   Authorization - Visit Number 11   Authorization - Number of Visits 30   PT Start Time 2563   PT Stop Time 1615   PT Time Calculation (min) 44 min   Equipment Utilized During Treatment Gait belt   Activity Tolerance Patient tolerated treatment well   Behavior During Therapy WFL for tasks assessed/performed      Past Medical History:  Diagnosis Date  . Asthma   . Bronchitis   . CKD (chronic kidney disease)   . Diabetes mellitus   . Diabetic Charcot foot ([place])    Left  . Gout   . Hypertension   . Kidney stones 11/30/2013  . Neuromuscular disorder ([place])   . Obesity   . Polyneuropathy in diabetes [facility])     Past Surgical History:  Procedure Laterality Date  . AMPUTATION Left 02/06/2016   Procedure: AMPUTATION BELOW KNEE;  Surgeon: Marybelle Killings, MD;  Location: [place];  Service: Orthopedics;  Laterality: Left;  . CHOLECYSTECTOMY N/A 12/03/2013   Procedure: LAPAROSCOPIC CHOLECYSTECTOMY WITH INTRAOPERATIVE CHOLANGIOGRAM;  Surgeon: Rolm Bookbinder, MD;  Location: [place];  Service: General;  Laterality: N/A;  . NO PAST SURGERIES      There were no vitals filed for this visit.      Subjective Assessment - 09/18/16 1538    Subjective No new complaints. No falls. Had bronchitis and upper respiratory infection, feeling better today. Pt wanting to know how much longer he needs to come to PT due to finacial contraints. Wanting to wrap up soon.   Pertinent History HTN, DM2, CKD stage II, asthma, gout    Limitations Lifting;Standing;Walking   Patient Stated Goals He wants to use prosthesis to get back in community,    Currently in Pain? No/denies   Pain Score 0-No pain             [facility] Adult PT Treatment/Exercise - 09/18/16 1545      Transfers   Transfers Sit to Stand;Stand to Sit   Sit to Stand 6: Modified independent (Device/Increase time);With upper extremity assist;From chair/3-in-1;With armrests   Stand to Sit 6: Modified independent (Device/Increase time);With upper extremity assist;To chair/3-in-1     Ambulation/Gait   Ambulation/Gait Yes   Ambulation/Gait Assistance 4: Min guard;5: Supervision   Ambulation/Gait Assistance Details no cues or assist needed with gait with no AD. Pt with a slight antalgic style gait with no pain reported: pelvic positon checked an prosthesis found to be short. Pt to call prosthetist to set appt to have this adjusted again.    Ambulation Distance (Feet) 120 Feet  x2, 100 x1 weaving around furniture/obstacles   Assistive device Prosthesis;None   Gait Pattern Step-through pattern;Decreased stride length;Decreased stance time - left;Decreased step length - right;Trunk flexed;Narrow base of support   Ambulation Surface Level;Indoor     Therapeutic Activites    Lifting 20# crate from floor to/from waist high mat ~15 feet apart with min guard assist after cues on technique   Other Therapeutic Activities pushing/pulling  weighted cart along 20 foot pathway x 2 each way. demo'd technique, then min cues for technique with pt performance. climbing A-frame ladder: pt reports he has not done this in a while with any job and most likely would not need to again. He was willing to learn the technique and practice. PTA demo'd 1st. then min assist for pt return demo with cues on prosthetic position with climbing up/down bottom 2 rungs only.                        Prosthetics   Prosthetic Care Comments  using antipersperant and/or barrier sock as needed for  sweat management. pt to increase wear to all awake hours.    Current prosthetic wear tolerance (days/week)  daily   Residual limb condition  intact without issues   Donning Prosthesis Modified independent (device/increased time)   Doffing Prosthesis Modified independent (device/increased time)             PT Short Term Goals - 08/19/16 1456      PT SHORT TERM GOAL #1   Title Patient tolerates wear of prosthesis >/= 8 hrs total without wounds increasing in size and no residual limb pain. (Target Date: 08/22/2016)   Baseline 08/19/16: met today as wound is healed and pt wearing >/= 8 hours a day without residual limb pain   Status Achieved     PT SHORT TERM GOAL #2   Title Patient demonstrates proper donning & verbalizes proper cleaning of prosthesis. (Target Date: 08/22/2016)   Baseline 08/19/16: met today   Status Achieved     PT SHORT TERM GOAL #3   Title Patient stands without UE support 1 minute and reaches 5" anteriorly with supervision. (Target Date: 08/22/2016)   Baseline 08/19/16: met today   Status Achieved     PT SHORT TERM GOAL #4   Title Patient ambulates 200' including around obstacles & scanning environment with RW & prosthesis safely with cues only for deviations. (Target Date: 08/22/2016)   Baseline 08/19/16: met today   Time --   Period --   Status Achieved     PT SHORT TERM GOAL #5   Title Patient negotiates ramps & curbs with RW & stairs with 2 rails with prosthesis with supervision. (Target Date: 08/22/2016)   Baseline 08/19/16: met today   Time --   Period --   Status Achieved     PT SHORT TERM GOAL #6   Title Patient tolerates standing & gait activities for 5 minutes with chronic pain in knees & back increasing </= 4 increments on 0-10 scale. (Target Date: 08/22/2016)   Baseline 08/19/16: met today during session   Time --   Period --   Status Achieved           PT Long Term Goals - 09/18/16 1539      PT LONG TERM GOAL #1   Title Patient tolerates  wear of prosthesis >90% of awake hours without skin issues or residual limb pain to enable function during his day. (Target Date: 09/26/2016)   Time 10   Period Weeks   Status On-going     PT LONG TERM GOAL #2   Title Patient demonstrates & verbalizes proper prosthetic care to enable safe use of prosthesis for function. (Target Date: 09/26/2016)   Time 10   Period Weeks   Status On-going     PT LONG TERM GOAL #3   Title Patient reports pain in knees & back  increases </= 2 increments on 0-10 scale with standing & gait activities. (Target Date: 09/26/2016)   Time 10   Period Weeks   Status On-going     PT LONG TERM GOAL #4   Title Berg Balance > 36/56 to indicate lower fall risk. (Target Date: 09/26/2016)   Time 10   Period Weeks   Status On-going     PT LONG TERM GOAL #5   Title Patient ambulates >400' outdoors with prosthesis & LRAD modified independent for community mobility. (Target Date: 09/26/2016)   Time 10   Period Weeks   Status On-going     PT LONG TERM GOAL #6   Title Patient negotiates ramps, curbs & stairs with prosthesis & LRAD modified independent to enable community access. (Target Date: 09/26/2016)   Time 10   Period Weeks   Status On-going            Plan - 09/18/16 2126    Clinical Impression Statement Today's skilled session continued to focus on gait/mobility with prosthesis only. Also worked on work simulation tasks such as lifting, pushing, and pulling with prosthesis. Pt wanting to discharge at next session due to high copay and spouse only one employed at this time, therefore addressed as much work simulation tasks as able this session, unable to get specific as pt not sure what kind of employement he will be looking to get if diability does not go through. Will address more at next/final session.   Rehab Potential Good   PT Frequency 2x / week   PT Duration Other (comment)  10 weeks   PT Treatment/Interventions ADLs/Self Care Home Management;DME  Instruction;Gait training;Stair training;Functional mobility training;Therapeutic activities;Therapeutic exercise;Balance training;Neuromuscular re-education;Patient/family education;Prosthetic Training   PT Next Visit Plan assess goals for discharge per pt request, address any remaining work simualtion tasks/training still needed.   Consulted and Agree with Plan of Care Patient      Patient will benefit from skilled therapeutic intervention in order to improve the following deficits and impairments:  Abnormal gait, Decreased activity tolerance, Decreased balance, Decreased endurance, Decreased knowledge of precautions, Decreased knowledge of use of DME, Decreased mobility, Decreased strength, Impaired flexibility, Postural dysfunction, Prosthetic Dependency  Visit Diagnosis: Other abnormalities of gait and mobility  Unsteadiness on feet  Other symptoms and signs involving the musculoskeletal system  Chronic pain of left knee  Chronic pain of right knee  Chronic midline low back pain, with sciatica presence unspecified     Problem List Patient Active Problem List   Diagnosis Date Noted  . Acute osteomyelitis of left foot ([place])   . Diabetic foot ([place]) 02/02/2016  . Osteomyelitis ([place]) 02/02/2016  . Osteomyelitis of foot, acute, left 02/02/2016  . Diabetic foot infection ([place]) 02/02/2016  . CKD (chronic kidney disease), stage II 02/02/2016  . Anemia 02/02/2016  . Kidney stones 11/30/2013  . Cholelithiasis: PER CT ABD/PELVIS AND ABD [place] 11/30/13 11/30/2013  . Acute renal failure ([place]) 11/29/2013  . Acute pancreatitis 11/29/2013  . Asthma exacerbation 11/09/2013  . CAP (community acquired pneumonia) 07/18/2013  . Diabetes ([place]) 07/18/2013  . Hypertension     [place], [place], [place] [address], [place],  [address] [phone] 09/19/16, 9:41 PM   Name: Lequan Dobratz MRN: [id number] Date of Birth: 11/15/1965

## 2016-09-22 ENCOUNTER — Encounter: Payer: Self-pay | Admitting: Physical Therapy

## 2016-09-22 ENCOUNTER — Ambulatory Visit: Payer: BLUE CROSS/BLUE SHIELD | Admitting: Physical Therapy

## 2016-09-22 DIAGNOSIS — M25562 Pain in left knee: Secondary | ICD-10-CM

## 2016-09-22 DIAGNOSIS — R2689 Other abnormalities of gait and mobility: Secondary | ICD-10-CM | POA: Diagnosis not present

## 2016-09-22 DIAGNOSIS — G8929 Other chronic pain: Secondary | ICD-10-CM

## 2016-09-22 DIAGNOSIS — R2681 Unsteadiness on feet: Secondary | ICD-10-CM

## 2016-09-22 DIAGNOSIS — M25561 Pain in right knee: Secondary | ICD-10-CM

## 2016-09-22 DIAGNOSIS — R29898 Other symptoms and signs involving the musculoskeletal system: Secondary | ICD-10-CM

## 2016-09-22 NOTE — Therapy (Signed)
[facility] [address] [place], [place], [address] Phone: [phone]   Fax:  [phone]  Physical Therapy Treatment  Patient Details  Name: Troy [name] MRN: [id number] Date of Birth: Jun 07, 1966 Referring Provider: Rodell Perna, MD  Encounter Date: 09/22/2016      PT End of Session - 09/22/16 1638    Visit Number 12   Number of Visits 20   Date for PT Re-Evaluation 09/26/16   Authorization Type [employer] 30 visit limit   Authorization - Visit Number 12   Authorization - Number of Visits 30   PT Start Time 8250   PT Stop Time 1523   PT Time Calculation (min) 38 min   Equipment Utilized During Treatment Gait belt   Activity Tolerance Patient tolerated treatment well   Behavior During Therapy WFL for tasks assessed/performed      Past Medical History:  Diagnosis Date  . Asthma   . Bronchitis   . CKD (chronic kidney disease)   . Diabetes mellitus   . Diabetic Charcot foot ([place])    Left  . Gout   . Hypertension   . Kidney stones 11/30/2013  . Neuromuscular disorder ([place])   . Obesity   . Polyneuropathy in diabetes [facility])     Past Surgical History:  Procedure Laterality Date  . AMPUTATION Left 02/06/2016   Procedure: AMPUTATION BELOW KNEE;  Surgeon: Marybelle Killings, MD;  Location: [facility];  Service: Orthopedics;  Laterality: Left;  . CHOLECYSTECTOMY N/A 12/03/2013   Procedure: LAPAROSCOPIC CHOLECYSTECTOMY WITH INTRAOPERATIVE CHOLANGIOGRAM;  Surgeon: Rolm Bookbinder, MD;  Location: [facility];  Service: General;  Laterality: N/A;  . NO PAST SURGERIES      There were no vitals filed for this visit.      Subjective Assessment - 09/22/16 1448    Subjective He is wearing prosthesis all awake hours without issues.    Patient is accompained by: Family member   Pertinent History HTN, DM2, CKD stage II, asthma, gout   Limitations Lifting;Standing;Walking   Patient Stated Goals He wants to use prosthesis to get back in  community,    Currently in Pain? No/denies            [facility] PT Assessment - 09/22/16 1445      Observation/Other Assessments   Focus on Therapeutic Outcomes (FOTO)  53.31 Functional Status  Initial FS was 36.63   Activities of Balance Confidence Scale (ABC Scale)  70.0%   Initial ABC 49.4%   Fear Avoidance Belief Questionnaire (FABQ)  66 (18)     Transfers   Transfers Sit to Stand;Stand to Sit   Sit to Stand 6: Modified independent (Device/Increase time);With upper extremity assist;From chair/3-in-1  no armrests on chair   Stand to Sit 6: Modified independent (Device/Increase time);With upper extremity assist;To chair/3-in-1  no armrests on chair     Ambulation/Gait   Ambulation/Gait Yes   Ambulation/Gait Assistance 6: Modified independent (Device/Increase time)   Ambulation Distance (Feet) 500 Feet   Assistive device Prosthesis;None;Straight cane   Gait Pattern Within Functional Limits   Ambulation Surface Level;Unlevel   Gait velocity 3.24 ft/sec comfortable & 3.80 ft/sec fast pace   Stairs Assistance 6: Modified independent (Device/Increase time)   Stair Management Technique Two rails;Alternating pattern;One rail Left;With cane;Forwards;Step to pattern   Number of Stairs 4   Ramp 6: Modified independent (Device)  prosthesis only   Curb 6: Modified independent (Device/increase time)  cane & prosthesis     Standardized Balance Assessment  Standardized Balance Assessment Timed Up and Go Test     Berg Balance Test   Sit to Stand Able to stand  independently using hands   Standing Unsupported Able to stand safely 2 minutes   Sitting with Back Unsupported but Feet Supported on Floor or Stool Able to sit safely and securely 2 minutes   Stand to Sit Sits safely with minimal use of hands   Transfers Able to transfer safely, minor use of hands   Standing Unsupported with Eyes Closed Able to stand 10 seconds safely   Standing Ubsupported with Feet Together Able to place  feet together independently and stand 1 minute safely   From Standing, Reach Forward with Outstretched Arm Can reach forward >12 cm safely (5")   From Standing Position, Pick up Object from Floor Able to pick up shoe safely and easily   From Standing Position, Turn to Look Behind Over each Shoulder Looks behind one side only/other side shows less weight shift   Turn 360 Degrees Able to turn 360 degrees safely but slowly   Standing Unsupported, Alternately Place Feet on Step/Stool Able to complete 4 steps without aid or supervision   Standing Unsupported, One Foot in Front Able to plae foot ahead of the other independently and hold 30 seconds   Standing on One Leg Tries to lift leg/unable to hold 3 seconds but remains standing independently   Total Score 45   Berg comment: Berg Balance 11/56     Timed Up and Go Test   Normal TUG (seconds) 13.11  13.11sec without device, 13.44sec with cane         Prosthetics Assessment - 09/22/16 1445      Prosthetics   Prosthetic Care Independent with Skin check;Residual limb care;Care of non-amputated limb;Prosthetic cleaning;Ply sock cleaning;Correct ply sock adjustment;Proper wear schedule/adjustment;Proper weight-bearing schedule/adjustment   Donning prosthesis  Modified independent (Device/Increase time)   Doffing prosthesis  Modified independent (Device/Increase time)   Current prosthetic wear tolerance (days/week)  daily   Current prosthetic wear tolerance (#hours/day)  >90% of awake hours   Current prosthetic weight-bearing tolerance (hours/day)  tolerates 10 min standing & gait without pain or discomfort                            PT Education - 09/22/16 1445    Education provided Yes   Education Details Amputee Support Group, follow-up with prosthetist   Person(s) Educated Patient   Methods Explanation;Other (comment)  brochure   Comprehension Verbalized understanding          PT Short Term Goals - 08/19/16 1456       PT SHORT TERM GOAL #1   Title Patient tolerates wear of prosthesis >/= 8 hrs total without wounds increasing in size and no residual limb pain. (Target Date: 08/22/2016)   Baseline 08/19/16: met today as wound is healed and pt wearing >/= 8 hours a day without residual limb pain   Status Achieved     PT SHORT TERM GOAL #2   Title Patient demonstrates proper donning & verbalizes proper cleaning of prosthesis. (Target Date: 08/22/2016)   Baseline 08/19/16: met today   Status Achieved     PT SHORT TERM GOAL #3   Title Patient stands without UE support 1 minute and reaches 5" anteriorly with supervision. (Target Date: 08/22/2016)   Baseline 08/19/16: met today   Status Achieved     PT SHORT TERM GOAL #4   Title  Patient ambulates 200' including around obstacles & scanning environment with RW & prosthesis safely with cues only for deviations. (Target Date: 08/22/2016)   Baseline 08/19/16: met today   Time --   Period --   Status Achieved     PT SHORT TERM GOAL #5   Title Patient negotiates ramps & curbs with RW & stairs with 2 rails with prosthesis with supervision. (Target Date: 08/22/2016)   Baseline 08/19/16: met today   Time --   Period --   Status Achieved     PT SHORT TERM GOAL #6   Title Patient tolerates standing & gait activities for 5 minutes with chronic pain in knees & back increasing </= 4 increments on 0-10 scale. (Target Date: 08/22/2016)   Baseline 08/19/16: met today during session   Time --   Period --   Status Achieved           PT Long Term Goals - 09/22/16 1639      PT LONG TERM GOAL #1   Title Patient tolerates wear of prosthesis >90% of awake hours without skin issues or residual limb pain to enable function during his day. (Target Date: 09/26/2016)   Baseline MET 09/22/2016   Time 10   Period Weeks   Status Achieved     PT LONG TERM GOAL #2   Title Patient demonstrates & verbalizes proper prosthetic care to enable safe use of prosthesis for function.  (Target Date: 09/26/2016)   Baseline MET 09/22/2016   Time 10   Period Weeks   Status Achieved     PT LONG TERM GOAL #3   Title Patient reports pain in knees & back increases </= 2 increments on 0-10 scale with standing & gait activities. (Target Date: 09/26/2016)   Baseline MET 09/22/2016 Pt reports no pain with activities.    Time 10   Period Weeks   Status Achieved     PT LONG TERM GOAL #4   Title Berg Balance > 36/56 to indicate lower fall risk. (Target Date: 09/26/2016)   Baseline MET 09/22/2016  Merrilee Jansky Balance 45/56   Time 10   Period Weeks   Status Achieved     PT LONG TERM GOAL #5   Title Patient ambulates >400' outdoors with prosthesis & LRAD modified independent for community mobility. (Target Date: 09/26/2016)   Baseline MET 09/22/2016 400' with cane & prosthesis, shorter distances with prosthesis only   Time 10   Period Weeks   Status Achieved     PT LONG TERM GOAL #6   Title Patient negotiates ramps, curbs & stairs with prosthesis & LRAD modified independent to enable community access. (Target Date: 09/26/2016)   Baseline MET 09/22/2016 with cane & prosthesis   Time 10   Period Weeks   Status Achieved               Plan - 09/22/16 1640    Clinical Impression Statement Patient met all LTGs. He is functioning with prosthesis only in home & with prosthesis and cane in community.    Rehab Potential Good   PT Frequency 2x / week   PT Duration Other (comment)  10 weeks   PT Treatment/Interventions ADLs/Self Care Home Management;DME Instruction;Gait training;Stair training;Functional mobility training;Therapeutic activities;Therapeutic exercise;Balance training;Neuromuscular re-education;Patient/family education;Prosthetic Training   PT Next Visit Plan discharge PT   Consulted and Agree with Plan of Care Patient      Patient will benefit from skilled therapeutic intervention in order to improve the following deficits and impairments:  Abnormal gait, Decreased activity  tolerance, Decreased balance, Decreased endurance, Decreased knowledge of precautions, Decreased knowledge of use of DME, Decreased mobility, Decreased strength, Impaired flexibility, Postural dysfunction, Prosthetic Dependency  Visit Diagnosis: Unsteadiness on feet  Other symptoms and signs involving the musculoskeletal system  Other abnormalities of gait and mobility  Chronic pain of left knee  Chronic pain of right knee     Problem List Patient Active Problem List   Diagnosis Date Noted  . Acute osteomyelitis of left foot ([place])   . Diabetic foot ([place]) 02/02/2016  . Osteomyelitis ([place]) 02/02/2016  . Osteomyelitis of foot, acute, left 02/02/2016  . Diabetic foot infection ([place]) 02/02/2016  . CKD (chronic kidney disease), stage II 02/02/2016  . Anemia 02/02/2016  . Kidney stones 11/30/2013  . Cholelithiasis: PER CT ABD/PELVIS AND ABD [place] 11/30/13 11/30/2013  . Acute renal failure ([place]) 11/29/2013  . Acute pancreatitis 11/29/2013  . Asthma exacerbation 11/09/2013  . CAP (community acquired pneumonia) 07/18/2013  . Diabetes ([place]) 07/18/2013  . Hypertension    PHYSICAL THERAPY DISCHARGE SUMMARY  Visits from Start of Care: 12  Current functional level related to goals / functional outcomes: See above   Remaining deficits: See above   Education / Equipment: Prosthetic training  Plan: Patient agrees to discharge.  Patient goals were met. Patient is being discharged due to meeting the stated rehab goals.  ?????         Jamey Reas PT, DPT 09/22/2016, 4:43 PM  [facility] [address] [place], [place], [address] Phone: [phone]   Fax:  [phone]  Name: Troy [name] MRN: [id number] Date of Birth: 06/30/1966

## 2016-09-24 ENCOUNTER — Encounter: Payer: Self-pay | Admitting: Physical Therapy

## 2016-09-25 ENCOUNTER — Encounter: Payer: Self-pay | Admitting: Physical Therapy

## 2016-09-29 ENCOUNTER — Encounter: Payer: Self-pay | Admitting: Physical Therapy

## 2016-10-01 ENCOUNTER — Encounter: Payer: Self-pay | Admitting: Physical Therapy

## 2016-10-02 ENCOUNTER — Encounter: Payer: Self-pay | Admitting: Physical Therapy

## 2016-11-20 ENCOUNTER — Ambulatory Visit (INDEPENDENT_AMBULATORY_CARE_PROVIDER_SITE_OTHER): Payer: BLUE CROSS/BLUE SHIELD | Admitting: Podiatry

## 2016-11-20 ENCOUNTER — Encounter: Payer: Self-pay | Admitting: Podiatry

## 2016-11-20 DIAGNOSIS — B351 Tinea unguium: Secondary | ICD-10-CM | POA: Diagnosis not present

## 2016-11-20 DIAGNOSIS — M79605 Pain in left leg: Secondary | ICD-10-CM

## 2016-11-20 DIAGNOSIS — M21619 Bunion of unspecified foot: Secondary | ICD-10-CM

## 2016-11-20 DIAGNOSIS — L84 Corns and callosities: Secondary | ICD-10-CM

## 2016-11-20 DIAGNOSIS — M79604 Pain in right leg: Secondary | ICD-10-CM

## 2016-11-22 NOTE — Progress Notes (Signed)
Subjective:    Patient ID: Troy [name], male   DOB: 51 y.o.   MRN: [id number]   HPI patient presents with redness on the medial side of his bunion deformity right and also noted to have nail disease 1-5 right with a history of amputation of his left lower leg secondary to diabetes and infection     ROS      Objective:  Physical Exam Neurovascular status has not changed right with patient noted to have a small abrasion medial side of the right first metatarsal that's not draining with no odor or proximal edema erythema drainage noted. Patient does have structural bunion deformity right and is also noted to have elongated nails 1-5 right that are incurvated and moderately tender when pressed    Assessment:    Mycotic nail infection with pain 1-5 both feet and abrasion of the right first metatarsal with no drainage noted currently     Plan:      I advised on wearing shoes that are made a very soft materials and are wide to prevent problems right and I went ahead today and I debrided nailbeds 1-5 on the right foot with no iatrogenic bleeding and I instructed if any changes were to occur he is to reappoint immediately and he does understand he is at high risk of amputation at one point in future

## 2017-02-19 ENCOUNTER — Ambulatory Visit: Payer: BLUE CROSS/BLUE SHIELD

## 2017-03-06 ENCOUNTER — Ambulatory Visit (INDEPENDENT_AMBULATORY_CARE_PROVIDER_SITE_OTHER): Payer: BLUE CROSS/BLUE SHIELD | Admitting: Orthopaedic Surgery

## 2017-03-06 ENCOUNTER — Encounter (INDEPENDENT_AMBULATORY_CARE_PROVIDER_SITE_OTHER): Payer: Self-pay | Admitting: Orthopaedic Surgery

## 2017-03-06 VITALS — BP 145/88 | HR 80 | Ht 70.0 in | Wt 300.0 lb

## 2017-03-06 DIAGNOSIS — T8789 Other complications of amputation stump: Secondary | ICD-10-CM

## 2017-03-06 DIAGNOSIS — L89891 Pressure ulcer of other site, stage 1: Secondary | ICD-10-CM | POA: Diagnosis not present

## 2017-03-06 NOTE — Progress Notes (Signed)
Office Visit Note   Patient: Troy [name]           Date of Birth: 13-Dec-1965           MRN: [id number] Visit Date: 03/06/2017              Requested by: Seward Carol, MD [address] [place], [place] [address] PCP: Seward Carol, MD   Assessment & Plan: Visit Diagnoses:  1. Pressure ulcer of BKA stump, stage 1 ([place])     Plan: Patient will use his walker to help unload the stump and taken off apply lotion to 3 times a day. He'll keep it clean and dry with soap water lotion. Prescription given for new socket due to shrinkage of his BKA stump. I discussed the no antibiotics are indicated and once he gets his new prosthesis he can work on weight loss continue work on improving his diabetic control and also following a diet to help lose some weight.  Follow-Up Instructions: No Follow-up on file.   Orders:  No orders of the defined types were placed in this encounter.  No orders of the defined types were placed in this encounter.     Procedures: No procedures performed   Clinical Data: No additional findings.   Subjective: Chief Complaint  Patient presents with  . Left Leg - Callouses    HPI patient had a left BKA on 02/06/2016 due to diabetes with the foot infection and osteomyelitis. He is up to 20 applied on his sock and has had pressure area over the distal tibia anteriorly with skin cracking. He's been applying lotion once a day. He had some bleeding earlier today and presents for concern. [facility] and due to stump shrinkage they're requesting a prescription for a new socket. The rest of his prosthesis is in good condition.  Review of Systems review of systems updated from last years surgery. He does have problems with renal failure, kidney stones, previous osteomyelitis of the left foot status post BKA 2017. Kidney stones history acute pancreatitis and current stump ulcer, past history of the knee chondromalacia and degenerative  medial meniscus with right knee anterior cruciate ligament ganglion otherwise 14 point review systems is negative.   Objective: Vital Signs: BP (!) 145/88 (BP Location: Right Arm, Patient Position: Sitting)   Pulse 80   Ht 5\' 10"  (1.778 m)   Wt 300 lb (136.1 kg)   BMI 43.05 kg/m   Physical Exam  Constitutional: He is oriented to person, place, and time. He appears well-developed and well-nourished.  HENT:  Head: Normocephalic and atraumatic.  Eyes: Pupils are equal, round, and reactive to light. EOM are normal.  Neck: No tracheal deviation present. No thyromegaly present.  Cardiovascular: Normal rate.   Pulmonary/Chest: Effort normal. He has no wheezes.  Abdominal: Soft. Bowel sounds are normal.  Obesity no tenderness  Musculoskeletal:  Patient has a left BKA with 20 July and bolt fixation. There is scar-shaped area of skin cracking which currently is not bleeding but is the site where he elderly leading the last several days. No cellulitis no lymphadenopathy. No purulent drainage.  Neurological: He is alert and oriented to person, place, and time.  Skin: Skin is warm and dry. Capillary refill takes less than 2 seconds.  Psychiatric: He has a normal mood and affect. His behavior is normal. Judgment and thought content normal.    Ortho Exam  Specialty Comments:  No specialty comments available.  Imaging:  No results found.   PMFS History: Patient Active Problem List   Diagnosis Date Noted  . Acute osteomyelitis of left foot ([place])   . Diabetic foot ([place]) 02/02/2016  . Osteomyelitis ([place]) 02/02/2016  . Osteomyelitis of foot, acute, left 02/02/2016  . Diabetic foot infection ([place]) 02/02/2016  . CKD (chronic kidney disease), stage II 02/02/2016  . Anemia 02/02/2016  . Kidney stones 11/30/2013  . Cholelithiasis: PER CT ABD/PELVIS AND ABD [place] 11/30/13 11/30/2013  . Acute renal failure ([place]) 11/29/2013  . Acute pancreatitis 11/29/2013  . Asthma exacerbation 11/09/2013  . CAP  (community acquired pneumonia) 07/18/2013  . Diabetes ([place]) 07/18/2013  . Hypertension    Past Medical History:  Diagnosis Date  . Asthma   . Bronchitis   . CKD (chronic kidney disease)   . Diabetes mellitus   . Diabetic Charcot foot ([place])    Left  . Gout   . Hypertension   . Kidney stones 11/30/2013  . Neuromuscular disorder ([place])   . Obesity   . Polyneuropathy in diabetes [facility])     Family History  Problem Relation Age of Onset  . Diabetes Mellitus II Father   . Diabetes Father   . Hypertension Father   . Diabetes Mother   . Hypertension Mother   . CAD Neg Hx     Past Surgical History:  Procedure Laterality Date  . AMPUTATION Left 02/06/2016   Procedure: AMPUTATION BELOW KNEE;  Surgeon: Marybelle Killings, MD;  Location: [facility];  Service: Orthopedics;  Laterality: Left;  . CHOLECYSTECTOMY N/A 12/03/2013   Procedure: LAPAROSCOPIC CHOLECYSTECTOMY WITH INTRAOPERATIVE CHOLANGIOGRAM;  Surgeon: Rolm Bookbinder, MD;  Location: [facility];  Service: General;  Laterality: N/A;  . NO PAST SURGERIES     Social History   Occupational History  . Not on file.   Social History Main Topics  . Smoking status: Never Smoker  . Smokeless tobacco: Never Used  . Alcohol use Yes     Comment: occasionally  . Drug use: No  . Sexual activity: Not on file

## 2017-03-18 ENCOUNTER — Ambulatory Visit (INDEPENDENT_AMBULATORY_CARE_PROVIDER_SITE_OTHER): Payer: BLUE CROSS/BLUE SHIELD | Admitting: Orthopaedic Surgery

## 2017-04-10 ENCOUNTER — Emergency Department (HOSPITAL_COMMUNITY)
Admission: EM | Admit: 2017-04-10 | Discharge: 2017-04-10 | Disposition: A | Discharge status: Home / Self Care | Payer: BLUE CROSS/BLUE SHIELD | Attending: Emergency Medicine | Admitting: Emergency Medicine

## 2017-04-10 ENCOUNTER — Emergency Department (HOSPITAL_COMMUNITY): Payer: BLUE CROSS/BLUE SHIELD

## 2017-04-10 ENCOUNTER — Encounter (HOSPITAL_COMMUNITY): Payer: Self-pay | Admitting: *Deleted

## 2017-04-10 DIAGNOSIS — K8500 Idiopathic acute pancreatitis without necrosis or infection: Secondary | ICD-10-CM | POA: Diagnosis not present

## 2017-04-10 DIAGNOSIS — N182 Chronic kidney disease, stage 2 (mild): Secondary | ICD-10-CM | POA: Insufficient documentation

## 2017-04-10 DIAGNOSIS — Z7984 Long term (current) use of oral hypoglycemic drugs: Secondary | ICD-10-CM | POA: Diagnosis not present

## 2017-04-10 DIAGNOSIS — E119 Type 2 diabetes mellitus without complications: Secondary | ICD-10-CM | POA: Insufficient documentation

## 2017-04-10 DIAGNOSIS — R0789 Other chest pain: Secondary | ICD-10-CM

## 2017-04-10 DIAGNOSIS — R079 Chest pain, unspecified: Secondary | ICD-10-CM | POA: Diagnosis present

## 2017-04-10 DIAGNOSIS — Z7982 Long term (current) use of aspirin: Secondary | ICD-10-CM | POA: Diagnosis not present

## 2017-04-10 DIAGNOSIS — Z79899 Other long term (current) drug therapy: Secondary | ICD-10-CM | POA: Insufficient documentation

## 2017-04-10 DIAGNOSIS — J45909 Unspecified asthma, uncomplicated: Secondary | ICD-10-CM | POA: Diagnosis not present

## 2017-04-10 DIAGNOSIS — I129 Hypertensive chronic kidney disease with stage 1 through stage 4 chronic kidney disease, or unspecified chronic kidney disease: Secondary | ICD-10-CM | POA: Diagnosis not present

## 2017-04-10 HISTORY — DX: Acute pancreatitis without necrosis or infection, unspecified: K85.90

## 2017-04-10 LAB — CBC
HCT: 40.2 % (ref 39.0–52.0)
Hemoglobin: 13.6 g/dL (ref 13.0–17.0)
MCH: 29.5 pg (ref 26.0–34.0)
MCHC: 33.8 g/dL (ref 30.0–36.0)
MCV: 87.2 fL (ref 78.0–100.0)
Platelets: 182 10*3/uL (ref 150–400)
RBC: 4.61 MIL/uL (ref 4.22–5.81)
RDW: 14.1 % (ref 11.5–15.5)
WBC: 8.1 10*3/uL (ref 4.0–10.5)

## 2017-04-10 LAB — I-STAT TROPONIN, ED
TROPONIN I, POC: 0 ng/mL (ref 0.00–0.08)
TROPONIN I, POC: 0.01 ng/mL (ref 0.00–0.08)

## 2017-04-10 LAB — BASIC METABOLIC PANEL
Anion gap: 11 (ref 5–15)
BUN: 19 mg/dL (ref 6–20)
CALCIUM: 8.7 mg/dL — AB (ref 8.9–10.3)
CO2: 21 mmol/L — AB (ref 22–32)
Chloride: 106 mmol/L (ref 101–111)
Creatinine, Ser: 1.14 mg/dL (ref 0.61–1.24)
GFR calc Af Amer: >60 mL/min (ref >60)
GLUCOSE: 109 mg/dL — AB (ref 65–99)
Potassium: 4.2 mmol/L (ref 3.5–5.1)
Sodium: 138 mmol/L (ref 135–145)

## 2017-04-10 LAB — D-DIMER, QUANTITATIVE: D-Dimer, Quant: 1.23 ug/mL-FEU — ABNORMAL HIGH (ref 0.00–0.50)

## 2017-04-10 LAB — HEPATIC FUNCTION PANEL
ALK PHOS: 84 U/L (ref 38–126)
ALT: 130 U/L — ABNORMAL HIGH (ref 17–63)
AST: 54 U/L — ABNORMAL HIGH (ref 15–41)
Albumin: 4 g/dL (ref 3.5–5.0)
BILIRUBIN DIRECT: 0.2 mg/dL (ref 0.1–0.5)
BILIRUBIN TOTAL: 0.7 mg/dL (ref 0.3–1.2)
Indirect Bilirubin: 0.5 mg/dL (ref 0.3–0.9)
Total Protein: 7.8 g/dL (ref 6.5–8.1)

## 2017-04-10 LAB — LIPASE, BLOOD: Lipase: 136 U/L — ABNORMAL HIGH (ref 11–51)

## 2017-04-10 MED ORDER — HYDROCODONE-ACETAMINOPHEN 5-325 MG PO TABS: 1.0000 | ORAL_TABLET | ORAL | 0 refills | Status: DC | PRN

## 2017-04-10 MED ORDER — FENTANYL CITRATE (PF) 100 MCG/2ML IJ SOLN
50.0000 ug | Freq: Once | INTRAMUSCULAR | Status: AC
  Administered 2017-04-10: 50 ug via INTRAVENOUS
  Filled 2017-04-10: qty 2

## 2017-04-10 MED ORDER — PANTOPRAZOLE SODIUM 40 MG IV SOLR
40.0000 mg | Freq: Once | INTRAVENOUS | Status: AC
  Administered 2017-04-10: 40 mg via INTRAVENOUS
  Filled 2017-04-10: qty 40

## 2017-04-10 MED ORDER — IOPAMIDOL (ISOVUE-370) INJECTION 76%
INTRAVENOUS | Status: AC
  Administered 2017-04-10: 100 mL
  Filled 2017-04-10: qty 100

## 2017-04-10 MED ORDER — ONDANSETRON 4 MG PO TBDP: ORAL_TABLET | ORAL | 0 refills | Status: DC

## 2017-04-10 NOTE — ED Provider Notes (Signed)
[facility] DEPT Provider Note   CSN: [id number] Arrival date & time: 04/10/17  0830     History   Chief Complaint Chief Complaint  Patient presents with  . Chest Pain    HPI Troy [name] is a 51 y.o. male.  Patient is a 51 year old male with a history of diabetes, chronic kidney disease, hypertensionwho presents with chest pain. He states it's been intermittent for about 2-3 days. He describes as a sharp pain that starts in his epigastric area and radiates to his right chest and at times goes through to his back. He states he's had a history of pancreatitis in the past and this feels similar. He states the pain is exacerbated by eating. It's not exertional. He states that she sometimes gets better. Gets up and walks around. He occasionally has some shortness of breath with it but no persistent shortness of breath. No pleuritic pain. He reports some pain in his right shoulder but it seems that his shoulder pain is made worse with movement of the shoulder. No diaphoresis. He's had some nausea but no vomiting. He took some tramadol last night which improved his symptoms. He is status post cholecystectomy.      Past Medical History:  Diagnosis Date  . Asthma   . Bronchitis   . CKD (chronic kidney disease)   . Diabetes mellitus   . Diabetic Charcot foot ([place])    Left  . Gout   . Hypertension   . Kidney stones 11/30/2013  . Neuromuscular disorder ([place])   . Obesity   . Pancreatitis   . Polyneuropathy in diabetes [facility])     Patient Active Problem List   Diagnosis Date Noted  . Acute osteomyelitis of left foot ([place])   . Diabetic foot ([place]) 02/02/2016  . Osteomyelitis ([place]) 02/02/2016  . Osteomyelitis of foot, acute, left 02/02/2016  . Diabetic foot infection ([place]) 02/02/2016  . CKD (chronic kidney disease), stage II 02/02/2016  . Anemia 02/02/2016  . Kidney stones 11/30/2013  . Cholelithiasis: PER CT ABD/PELVIS AND ABD [place] 11/30/13 11/30/2013  . Acute renal failure ([place])  11/29/2013  . Acute pancreatitis 11/29/2013  . Asthma exacerbation 11/09/2013  . CAP (community acquired pneumonia) 07/18/2013  . Diabetes ([place]) 07/18/2013  . Hypertension     Past Surgical History:  Procedure Laterality Date  . AMPUTATION Left 02/06/2016   Procedure: AMPUTATION BELOW KNEE;  Surgeon: Marybelle Killings, MD;  Location: [facility];  Service: Orthopedics;  Laterality: Left;  . CHOLECYSTECTOMY N/A 12/03/2013   Procedure: LAPAROSCOPIC CHOLECYSTECTOMY WITH INTRAOPERATIVE CHOLANGIOGRAM;  Surgeon: Rolm Bookbinder, MD;  Location: [facility];  Service: General;  Laterality: N/A;  . NO PAST SURGERIES         Home Medications    Prior to Admission medications   Medication Sig Start Date End Date Taking? Authorizing Provider  acetaminophen (TYLENOL) 500 MG tablet Take 1,000 mg by mouth every 6 (six) hours as needed for moderate pain.   Yes [provider]  albuterol (PROVENTIL HFA;VENTOLIN HFA) 108 (90 Base) MCG/ACT inhaler Inhale 1-2 puffs into the lungs every 6 (six) hours as needed for wheezing or shortness of breath. 08/25/15  Yes Laisure, Nira Conn, PA-C  albuterol (PROVENTIL) (2.5 MG/3ML) 0.083% nebulizer solution Take 2.5 mg by nebulization every 4 (four) hours as needed for wheezing.  06/11/16  Yes [provider]  aspirin (ASPIRIN EC) 81 MG EC tablet Take 81 mg by mouth daily. Swallow whole.   Yes [provider]  atorvastatin (LIPITOR) 10 MG  tablet Take 10 mg by mouth daily. 03/06/17  Yes [provider]  BREO ELLIPTA 100-25 MCG/INH AEPB Inhale 1 puff into the lungs daily.  07/31/15  Yes [provider]  citalopram (CELEXA) 10 MG tablet Take 10 mg by mouth daily. 03/06/17  Yes [provider]  CONTOUR NEXT TEST test strip 1 each daily as needed. 03/24/17  Yes [provider]  fluticasone (FLONASE) 50 MCG/ACT nasal spray Place 2 sprays into both nostrils daily. 67/61/95  Yes Delora Fuel, MD  glimepiride (AMARYL) 4 MG tablet Take 4  mg by mouth daily with breakfast.   Yes [provider]  LANTUS SOLOSTAR 100 UNIT/ML Solostar Pen Inject 10 Units into the skin every morning.  01/10/16  Yes [provider]  lisinopril-hydrochlorothiazide (PRINZIDE,ZESTORETIC) 10-12.5 MG tablet Take 1 tablet by mouth daily.  05/06/16  Yes [provider]  meloxicam (MOBIC) 15 MG tablet Take 15 mg by mouth every morning. 03/06/17  Yes [provider]  metFORMIN (GLUCOPHAGE) 1000 MG tablet Take 1,000 mg by mouth 2 (two) times daily. 03/16/17  Yes [provider]  MICROLET LANCETS MISC 1 each daily as needed. 02/16/17  Yes [provider]  Multiple Vitamin (MULTIVITAMIN WITH MINERALS) TABS tablet Take 1 tablet by mouth daily.   Yes [provider]  pantoprazole (PROTONIX) 40 MG tablet Take 40 mg by mouth daily.  06/11/16  Yes [provider]  traMADol (ULTRAM) 50 MG tablet Take 50 mg by mouth every 12 (twelve) hours as needed for moderate pain.  03/19/17  Yes [provider]  traZODone (DESYREL) 50 MG tablet Take 50 mg by mouth at bedtime. 03/24/17  Yes [provider]  VIAGRA 100 MG tablet Take 100 mg by mouth as needed for erectile dysfunction.  06/19/14  Yes [provider]  cetirizine (ZYRTEC) 10 MG tablet Take 10 mg by mouth daily.    [provider]  cyclobenzaprine (FLEXERIL) 10 MG tablet Take 0.5-1 tablets (5-10 mg total) by mouth 2 (two) times daily as needed. 06/03/15   Delos Haring, PA-C  doxycycline (VIBRA-TABS) 100 MG tablet Take 1 tablet (100 mg total) by mouth 2 (two) times daily. 09/12/16   Robyn Haber, MD  HYDROcodone-acetaminophen (NORCO/VICODIN) 5-325 MG tablet Take 1-2 tablets by mouth every 4 (four) hours as needed. 04/10/17   Malvin Johns, MD  metFORMIN (GLUCOPHAGE) 500 MG tablet Take 1,000 mg by mouth 2 (two) times daily.  06/29/14   [provider]  mupirocin ointment (BACTROBAN) 2 % Apply 1 application topically  3 (three) times daily. 09/12/16   Robyn Haber, MD  ondansetron (ZOFRAN ODT) 4 MG disintegrating tablet 4mg  ODT q4 hours prn nausea/vomit 04/10/17   Malvin Johns, MD    Family History Family History  Problem Relation Age of Onset  . Diabetes Mellitus II Father   . Diabetes Father   . Hypertension Father   . Diabetes Mother   . Hypertension Mother   . CAD Neg Hx     Social History Social History  Substance Use Topics  . Smoking status: Never Smoker  . Smokeless tobacco: Never Used  . Alcohol use Yes     Comment: occasionally     Allergies   No known allergies   Review of Systems Review of Systems  Constitutional: Negative for chills, diaphoresis, fatigue and fever.  HENT: Negative for congestion, rhinorrhea and sneezing.   Eyes: Negative.   Respiratory: Positive for shortness of breath. Negative for cough and chest tightness.  Cardiovascular: Positive for chest pain. Negative for leg swelling.  Gastrointestinal: Positive for abdominal pain and nausea. Negative for blood in stool, diarrhea and vomiting.  Genitourinary: Negative for difficulty urinating, flank pain, frequency and hematuria.  Musculoskeletal: Positive for back pain. Negative for arthralgias.  Skin: Negative for rash.  Neurological: Negative for dizziness, speech difficulty, weakness, numbness and headaches.     Physical Exam Updated Vital Signs BP 133/87   Pulse 84   Temp 98.8 F (37.1 C) (Oral)   Resp 14   SpO2 95%   Physical Exam  Constitutional: He is oriented to person, place, and time. He appears well-developed and well-nourished.  HENT:  Head: Normocephalic and atraumatic.  Eyes: Pupils are equal, round, and reactive to light.  Neck: Normal range of motion. Neck supple.  Cardiovascular: Normal rate, regular rhythm and normal heart sounds.   Pulmonary/Chest: Effort normal and breath sounds normal. No respiratory distress. He has no wheezes. He has no rales. He exhibits no tenderness.    Abdominal: Soft. Bowel sounds are normal. There is tenderness. There is no rebound and no guarding.  Mild tenderness to his epigastrium  Musculoskeletal: Normal range of motion. He exhibits no edema.  Mild pain on range of motion the right shoulder and palpation of the anterior shoulder. There is no swelling or deformity noted.status post leftleg amputation  Lymphadenopathy:    He has no cervical adenopathy.  Neurological: He is alert and oriented to person, place, and time.  Skin: Skin is warm and dry. No rash noted.  Psychiatric: He has a normal mood and affect.     ED Treatments / Results  Labs (all labs ordered are listed, but only abnormal results are displayed) Labs Reviewed  BASIC METABOLIC PANEL - Abnormal; Notable for the following:       Result Value   CO2 21 (*)    Glucose, Bld 109 (*)    Calcium 8.7 (*)    All other components within normal limits  D-DIMER, QUANTITATIVE (NOT AT [facility]) - Abnormal; Notable for the following:    D-Dimer, Quant 1.23 (*)    All other components within normal limits  HEPATIC FUNCTION PANEL - Abnormal; Notable for the following:    AST 54 (*)    ALT 130 (*)    All other components within normal limits  LIPASE, BLOOD - Abnormal; Notable for the following:    Lipase 136 (*)    All other components within normal limits  CBC  I-STAT TROPONIN, ED  I-STAT TROPONIN, ED    EKG  EKG Interpretation  Date/Time:  Friday April 10 2017 08:29:16 EDT Ventricular Rate:  87 PR Interval:  184 QRS Duration: 96 QT Interval:  396 QTC Calculation: 476 R Axis:   117 Text Interpretation:  Normal sinus rhythm Right axis deviation Low voltage QRS Cannot rule out Anterior infarct , age undetermined Abnormal ECG similar to EKG from 04/01/2004 Confirmed by Malvin Johns [phone]) on 04/10/2017 9:18:19 AM       Radiology Dg Chest 2 View  Result Date: 04/10/2017 CLINICAL DATA:  Chest pain. EXAM: CHEST  2 VIEW COMPARISON:  May 23, 2016. FINDINGS: The  cardiomediastinal silhouette is borderline enlarged. Normal pulmonary vascularity. No focal consolidation, pleural effusion, or pneumothorax. No acute osseous abnormality. IMPRESSION: Borderline cardiomegaly.  No active cardiopulmonary disease. Electronically Signed   By: Titus Dubin M.D.   On: 04/10/2017 08:55   Ct Angio Chest Pe W/cm &/or Wo Cm  Result Date: 04/10/2017 CLINICAL DATA:  Chest  and upper abdominal pain when eating with pain radiating to back EXAM: CT ANGIOGRAPHY CHEST WITH CONTRAST TECHNIQUE: Multidetector CT imaging of the chest was performed using the standard protocol during bolus administration of intravenous contrast. Multiplanar CT image reconstructions and MIPs were obtained to evaluate the vascular anatomy. CONTRAST:  100 cc Isovue 370 IV COMPARISON:  08/25/2015 FINDINGS: Cardiovascular: Aorta normal caliber without aneurysm or dissection. No pericardial effusion. Pulmonary arteries adequately opacified and patent. No definite evidence of pulmonary embolism. Mediastinum/Nodes: Esophagus unremarkable. Base of cervical region normal appearance. Few scattered normal sized mediastinal lymph nodes without thoracic adenopathy. Lungs/Pleura: Subsegmental atelectasis at LEFT lower lobe. Minimal patchy mosaic attenuation in both lungs, nonspecific but could represent minimal edema or early infection. No additional segmental consolidation, pleural effusion, or pneumothorax. Upper Abdomen: Gallbladder surgically absent. Visualized upper abdomen otherwise unremarkable. Musculoskeletal: No acute osseous findings. Review of the MIP images confirms the above findings. IMPRESSION: No evidence of pulmonary embolism. Atelectasis in LEFT lower lobe with scattered patchy mosaic attenuation which could represent minimal edema or alveolitis. Electronically Signed   By: Lavonia Dana M.D.   On: 04/10/2017 13:06   US Abdomen Complete  Result Date: 04/10/2017 CLINICAL DATA:  Abdominal pain and nausea 5 days.  Previous cholecystectomy. History of pancreatitis. EXAM: ABDOMEN ULTRASOUND COMPLETE COMPARISON:  11/30/2013 and CT 11/30/2013 FINDINGS: Gallbladder: Previous cholecystectomy. Common bile duct: Diameter: 4.9 mm. Liver: Mild diffuse increased parenchymal echogenicity without focal mass Portal vein is patent on color Doppler imaging with normal direction of blood flow towards the liver. IVC: No abnormality visualized. Pancreas: Visualized portion unremarkable. Spleen: Size and appearance within normal limits. Right Kidney: Length: 13.0 cm. Echogenicity within normal limits. 1.9 cm stone over the lower pole. No mass or hydronephrosis visualized. Left Kidney: Length: 11.5 cm. Echogenicity within normal limits. No mass or hydronephrosis visualized. Abdominal aorta: Proximal and midportion not visualized due to abundant overlying bowel gas. Distal aorta measures 1.8 cm in AP diameter. Other findings: None. IMPRESSION: No acute findings.  Previous cholecystectomy. Hepatic steatosis. 1.9 cm lower pole right renal stone.  No hydronephrosis. Electronically Signed   By: Marin Olp M.D.   On: 04/10/2017 13:29    Procedures Procedures (including critical care time)  Medications Ordered in ED Medications  pantoprazole (PROTONIX) injection 40 mg (40 mg Intravenous Given 04/10/17 1128)  fentaNYL (SUBLIMAZE) injection 50 mcg (50 mcg Intravenous Given 04/10/17 1125)  iopamidol (ISOVUE-370) 76 % injection (100 mLs  Contrast Given 04/10/17 1224)     Initial Impression / Assessment and Plan / ED Course  I have reviewed the triage vital signs and the nursing notes.  Pertinent labs & imaging results that were available during my care of the patient were reviewed by me and considered in my medical decision making (see chart for details).     Patient presents with epigastric pain radiating into the chest.  He has reproducible tenderness to the epigastrium. EKG doesn't show any ischemic changes in his had 2 negative  troponins. It does not sound suspicious for acute coronary syndrome. His lipase is elevated indicating likely pancreatitis. He has some mild elevation of his liver enzymes. He does admit to drinking about 8 beers once or twice a week. He is status post cholecystectomy. An ultrasound of the upper abdomen was performed which not show any acute abnormalities. His d-dimer was elevated and a CT chest was performed which shows no evidence of pulmonary embolus. He's feeling much better and his pain is controlled in the ED. He's had  no vomiting. No fevers. He's otherwise well-appearing. I feel he can try outpatient therapy at this point. He was discharged home in good condition. He was given a prescription for Vicodin and Zofran. He was encouraged to use a clear liquid diet for the next 48 hours and slowly progress after this. He was counseled that he needs to refrain from drinking alcohol. He was advised that his LFTs were mildly elevated and need to be rechecked by his PCP. He was encouraged to make a follow-up appointment with his PCP next week. Return precautions were given.  Final Clinical Impressions(s) / ED Diagnoses   Final diagnoses:  Idiopathic acute pancreatitis without infection or necrosis  Atypical chest pain    New Prescriptions New Prescriptions   HYDROCODONE-ACETAMINOPHEN (NORCO/VICODIN) 5-325 MG TABLET    Take 1-2 tablets by mouth every 4 (four) hours as needed.   ONDANSETRON (ZOFRAN ODT) 4 MG DISINTEGRATING TABLET    4mg  ODT q4 hours prn nausea/vomit     Malvin Johns, MD 04/10/17 1531

## 2017-04-10 NOTE — Discharge Instructions (Signed)
Your liver enzymes were elevated and need to by rechecked by your primary care provider.

## 2017-04-10 NOTE — ED Triage Notes (Signed)
Pt reports mid chest pain x 2 days that is sharp stabbing pains. Radiates into neck/upper back and right side of chest. Denies sob and n/v.

## 2017-04-10 NOTE — ED Notes (Signed)
Doctor at bedside.

## 2017-04-10 NOTE — ED Notes (Signed)
Pt  Did well with sprite

## 2017-04-15 ENCOUNTER — Telehealth (INDEPENDENT_AMBULATORY_CARE_PROVIDER_SITE_OTHER): Payer: Self-pay | Admitting: Orthopedic Surgery

## 2017-04-15 NOTE — Telephone Encounter (Signed)
OV NOTES FAXED TO [facility] [phone]

## 2017-08-11 ENCOUNTER — Other Ambulatory Visit: Payer: Self-pay | Admitting: Gastroenterology

## 2017-08-21 ENCOUNTER — Encounter (HOSPITAL_COMMUNITY): Payer: Self-pay

## 2017-08-28 ENCOUNTER — Other Ambulatory Visit: Payer: Self-pay | Admitting: Gastroenterology

## 2017-09-01 ENCOUNTER — Encounter (HOSPITAL_COMMUNITY)
Admission: RE | Disposition: A | Discharge status: Home / Self Care | Payer: Self-pay | Source: Ambulatory Visit | Attending: Gastroenterology

## 2017-09-01 ENCOUNTER — Other Ambulatory Visit: Payer: Self-pay

## 2017-09-01 ENCOUNTER — Ambulatory Visit (HOSPITAL_COMMUNITY): Payer: BLUE CROSS/BLUE SHIELD | Admitting: Anesthesiology

## 2017-09-01 ENCOUNTER — Ambulatory Visit (HOSPITAL_COMMUNITY)
Admission: RE | Admit: 2017-09-01 | Discharge: 2017-09-01 | Disposition: A | Discharge status: Home / Self Care | Payer: BLUE CROSS/BLUE SHIELD | Source: Ambulatory Visit | Attending: Gastroenterology | Admitting: Gastroenterology

## 2017-09-01 ENCOUNTER — Encounter (HOSPITAL_COMMUNITY): Payer: Self-pay | Admitting: Emergency Medicine

## 2017-09-01 DIAGNOSIS — Z794 Long term (current) use of insulin: Secondary | ICD-10-CM | POA: Insufficient documentation

## 2017-09-01 DIAGNOSIS — Z79899 Other long term (current) drug therapy: Secondary | ICD-10-CM | POA: Diagnosis not present

## 2017-09-01 DIAGNOSIS — E119 Type 2 diabetes mellitus without complications: Secondary | ICD-10-CM | POA: Insufficient documentation

## 2017-09-01 DIAGNOSIS — Z6841 Body Mass Index (BMI) 40.0 and over, adult: Secondary | ICD-10-CM | POA: Diagnosis not present

## 2017-09-01 DIAGNOSIS — K219 Gastro-esophageal reflux disease without esophagitis: Secondary | ICD-10-CM | POA: Diagnosis not present

## 2017-09-01 DIAGNOSIS — R1310 Dysphagia, unspecified: Secondary | ICD-10-CM

## 2017-09-01 DIAGNOSIS — Z7982 Long term (current) use of aspirin: Secondary | ICD-10-CM | POA: Insufficient documentation

## 2017-09-01 DIAGNOSIS — D122 Benign neoplasm of ascending colon: Secondary | ICD-10-CM | POA: Diagnosis not present

## 2017-09-01 DIAGNOSIS — Z7984 Long term (current) use of oral hypoglycemic drugs: Secondary | ICD-10-CM | POA: Diagnosis not present

## 2017-09-01 DIAGNOSIS — K640 First degree hemorrhoids: Secondary | ICD-10-CM | POA: Insufficient documentation

## 2017-09-01 DIAGNOSIS — Z1211 Encounter for screening for malignant neoplasm of colon: Secondary | ICD-10-CM | POA: Diagnosis not present

## 2017-09-01 DIAGNOSIS — B3781 Candidal esophagitis: Secondary | ICD-10-CM | POA: Insufficient documentation

## 2017-09-01 DIAGNOSIS — K5730 Diverticulosis of large intestine without perforation or abscess without bleeding: Secondary | ICD-10-CM | POA: Insufficient documentation

## 2017-09-01 DIAGNOSIS — I10 Essential (primary) hypertension: Secondary | ICD-10-CM | POA: Diagnosis not present

## 2017-09-01 HISTORY — PX: ESOPHAGOGASTRODUODENOSCOPY (EGD) WITH PROPOFOL: SHX5813

## 2017-09-01 HISTORY — PX: COLONOSCOPY WITH PROPOFOL: SHX5780

## 2017-09-01 LAB — GLUCOSE, CAPILLARY: Glucose-Capillary: 101 mg/dL — ABNORMAL HIGH (ref 65–99)

## 2017-09-01 SURGERY — COLONOSCOPY WITH PROPOFOL
Anesthesia: Monitor Anesthesia Care

## 2017-09-01 MED ORDER — PROPOFOL 10 MG/ML IV BOLUS
INTRAVENOUS | Status: AC
  Filled 2017-09-01: qty 40

## 2017-09-01 MED ORDER — LACTATED RINGERS IV SOLN
INTRAVENOUS | Status: DC
  Administered 2017-09-01: 10:00:00 via INTRAVENOUS

## 2017-09-01 MED ORDER — SODIUM CHLORIDE 0.9 % IV SOLN: INTRAVENOUS | Status: DC

## 2017-09-01 MED ORDER — PROPOFOL 10 MG/ML IV BOLUS
INTRAVENOUS | Status: AC
  Filled 2017-09-01: qty 20

## 2017-09-01 MED ORDER — LIDOCAINE 2% (20 MG/ML) 5 ML SYRINGE
INTRAMUSCULAR | Status: DC | PRN
  Administered 2017-09-01: 100 mg via INTRAVENOUS

## 2017-09-01 MED ORDER — ONDANSETRON HCL 4 MG/2ML IJ SOLN
INTRAMUSCULAR | Status: DC | PRN
  Administered 2017-09-01: 4 mg via INTRAVENOUS

## 2017-09-01 MED ORDER — PROPOFOL 500 MG/50ML IV EMUL
INTRAVENOUS | Status: DC | PRN
  Administered 2017-09-01: 200 ug/kg/min via INTRAVENOUS

## 2017-09-01 MED ORDER — PROPOFOL 10 MG/ML IV BOLUS
INTRAVENOUS | Status: DC | PRN
  Administered 2017-09-01: 20 mg via INTRAVENOUS
  Administered 2017-09-01: 10 mg via INTRAVENOUS

## 2017-09-01 SURGICAL SUPPLY — 25 items

## 2017-09-01 NOTE — Transfer of Care (Signed)
Immediate Anesthesia Transfer of Care Note  Patient: Troy [name]  Procedure(s) Performed: COLONOSCOPY WITH PROPOFOL (N/A ) ESOPHAGOGASTRODUODENOSCOPY (EGD) WITH PROPOFOL (N/A )  Patient Location: PACU  Anesthesia Type:MAC  Level of Consciousness: awake, alert  and oriented  Airway & Oxygen Therapy: Patient Spontanous Breathing and Patient connected to nasal cannula oxygen  Post-op Assessment: Report given to RN and Post -op Vital signs reviewed and stable  Post vital signs: Reviewed and stable  Last Vitals:  Vitals:   09/01/17 0926  BP: (!) 153/86  Pulse: 79  Resp: 14  Temp: 36.6 C  SpO2: 99%    Last Pain:  Vitals:   09/01/17 0926  TempSrc: Oral         Complications: No apparent anesthesia complications

## 2017-09-01 NOTE — Anesthesia Postprocedure Evaluation (Signed)
Anesthesia Post Note  Patient: Troy [name]  Procedure(s) Performed: COLONOSCOPY WITH PROPOFOL (N/A ) ESOPHAGOGASTRODUODENOSCOPY (EGD) WITH PROPOFOL (N/A )     Patient location during evaluation: Endoscopy Anesthesia Type: MAC Level of consciousness: awake Pain management: pain level controlled Vital Signs Assessment: post-procedure vital signs reviewed and stable Respiratory status: spontaneous breathing Cardiovascular status: stable Postop Assessment: no apparent nausea or vomiting Anesthetic complications: no    Last Vitals:  Vitals:   09/01/17 1150 09/01/17 1200  BP: (!) 171/107 (!) 171/108  Pulse: 79 77  Resp: 13 17  Temp:    SpO2: 98% 99%    Last Pain:  Vitals:   09/01/17 1130  TempSrc: Oral   Pain Goal:                 Troy [name],Troy [name]

## 2017-09-01 NOTE — Discharge Instructions (Signed)

## 2017-09-01 NOTE — Anesthesia Preprocedure Evaluation (Signed)
Anesthesia Evaluation  Patient identified by MRN, date of birth, ID band Patient awake    Reviewed: Allergy & Precautions, NPO status , Patient's Chart, lab work & pertinent test results  Airway Mallampati: II       Dental no notable dental hx. (+) Teeth Intact   Pulmonary    Pulmonary exam normal breath sounds clear to auscultation       Cardiovascular hypertension, Pt. on medications Normal cardiovascular exam Rhythm:Regular Rate:Normal     Neuro/Psych negative psych ROS   GI/Hepatic   Endo/Other  diabetes, Oral Hypoglycemic AgentsMorbid obesity  Renal/GU      Musculoskeletal   Abdominal (+) + obese,   Peds  Hematology   Anesthesia Other Findings   Reproductive/Obstetrics                             Anesthesia Physical Anesthesia Plan  ASA: III  Anesthesia Plan: MAC   Post-op Pain Management:    Induction:   PONV Risk Score and Plan: 1 and Ondansetron  Airway Management Planned: Natural Airway, Nasal Cannula and Simple Face Mask  Additional Equipment:   Intra-op Plan:   Post-operative Plan:   Informed Consent: I have reviewed the patients History and Physical, chart, labs and discussed the procedure including the risks, benefits and alternatives for the proposed anesthesia with the patient or authorized representative who has indicated his/her understanding and acceptance.     Plan Discussed with: CRNA  Anesthesia Plan Comments:         Anesthesia Quick Evaluation

## 2017-09-01 NOTE — Op Note (Signed)
[facility] Patient Name: Troy [name] Procedure Date: 09/01/2017 MRN: [id number] Attending MD: Lear Ng , MD Date of Birth: 1966/07/26 CSN: [id number] Age: 52 Admit Type: Outpatient Procedure:                Upper GI endoscopy Indications:              Dysphagia, Esophageal reflux Providers:                Lear Ng, MD, Elmer Ramp. Tilden Dome, RN,                            Nevin Bloodgood, Technician, Virgia Land, CRNA Referring MD:             Seward Carol Medicines:                Propofol per Anesthesia, Monitored Anesthesia Care Complications:            No immediate complications. Estimated Blood Loss:     Estimated blood loss: none. Procedure:                Pre-Anesthesia Assessment:                           - Prior to the procedure, a History and Physical                            was performed, and patient medications and                            allergies were reviewed. The patient's tolerance of                            previous anesthesia was also reviewed. The risks                            and benefits of the procedure and the sedation                            options and risks were discussed with the patient.                            All questions were answered, and informed consent                            was obtained. Prior Anticoagulants: The patient has                            taken no previous anticoagulant or antiplatelet                            agents. ASA Grade Assessment: III - A patient with                            severe systemic disease. After reviewing the risks  and benefits, the patient was deemed in                            satisfactory condition to undergo the procedure.                           After obtaining informed consent, the endoscope was                            passed under direct vision. Throughout the                            procedure, the patient's  blood pressure, pulse, and                            oxygen saturations were monitored continuously. The                            [id number] ([id number]) scope was introduced through the                            mouth, and advanced to the second part of duodenum.                            The upper GI endoscopy was accomplished without                            difficulty. The patient tolerated the procedure                            well. Scope In: Scope Out: Findings:      Patchy candidiasis was found in the mid esophagus.      The exam of the esophagus was otherwise normal.      The Z-line was regular and was found 42 cm from the incisors.      The entire examined stomach was normal.      The examined duodenum was normal. Impression:               - Monilial esophagitis.                           - Z-line regular, 42 cm from the incisors.                           - Normal stomach.                           - Normal examined duodenum.                           - No specimens collected. Moderate Sedation:      N/A- Per Anesthesia Care Recommendation:           - Patient has a contact number available for  emergencies. The signs and symptoms of potential                            delayed complications were discussed with the                            patient. Return to normal activities tomorrow.                            Written discharge instructions were provided to the                            patient.                           - Resume previous diet.                           - Nystatin suspension 100,000 units PO QID for 2                            weeks.                           - Post procedure medication orders were given. Procedure Code(s):        --- Professional ---                           [phone], Esophagogastroduodenoscopy, flexible,                            transoral; diagnostic, including collection of                             specimen(s) by brushing or washing, when performed                            (separate procedure) Diagnosis Code(s):        --- Professional ---                           R13.10, Dysphagia, unspecified                           K21.9, Gastro-esophageal reflux disease without                            esophagitis                           B37.81, Candidal esophagitis CPT copyright 2016 [employer]. All rights reserved. The codes documented in this report are preliminary and upon coder review may  be revised to meet current compliance requirements. Lear Ng, MD 09/01/2017 11:30:54 AM This report has been signed electronically. Number of Addenda: 0

## 2017-09-01 NOTE — H&P (Signed)
Date of Initial H&P: 08/11/17  History reviewed, patient examined, no change in status, stable for surgery.

## 2017-09-01 NOTE — Op Note (Signed)
[facility] Patient Name: Troy [name] Procedure Date: 09/01/2017 MRN: [id number] Attending MD: Lear Ng , MD Date of Birth: 1965/10/08 CSN: [id number] Age: 52 Admit Type: Outpatient Procedure:                Colonoscopy Indications:              Screening for colorectal malignant neoplasm, This                            is the patient's first colonoscopy Providers:                Lear Ng, MD, Elmer Ramp. Tilden Dome, RN,                            Nevin Bloodgood, Technician, Virgia Land, CRNA Referring MD:             Seward Carol Medicines:                Propofol per Anesthesia, Monitored Anesthesia Care Complications:            No immediate complications. Estimated Blood Loss:     Estimated blood loss: none. Procedure:                Pre-Anesthesia Assessment:                           - Prior to the procedure, a History and Physical                            was performed, and patient medications and                            allergies were reviewed. The patient's tolerance of                            previous anesthesia was also reviewed. The risks                            and benefits of the procedure and the sedation                            options and risks were discussed with the patient.                            All questions were answered, and informed consent                            was obtained. Prior Anticoagulants: The patient has                            taken no previous anticoagulant or antiplatelet                            agents. ASA Grade Assessment: III - A patient with  severe systemic disease. After reviewing the risks                            and benefits, the patient was deemed in                            satisfactory condition to undergo the procedure.                           After obtaining informed consent, the colonoscope                            was passed under  direct vision. Throughout the                            procedure, the patient's blood pressure, pulse, and                            oxygen saturations were monitored continuously. The                            EC-[id number] ([id number]) scope was introduced through                            the anus and advanced to the the cecum, identified                            by appendiceal orifice and ileocecal valve. The                            colonoscopy was performed without difficulty. The                            patient tolerated the procedure well. The quality                            of the bowel preparation was fair. The ileocecal                            valve, appendiceal orifice, and rectum were                            photographed. Scope In: 11:03:05 AM Scope Out: 11:23:06 AM Scope Withdrawal Time: 0 hours 14 minutes 10 seconds  Total Procedure Duration: 0 hours 20 minutes 1 second  Findings:      The perianal and digital rectal examinations were normal.      A 20 mm polyp was found in the ascending colon. The polyp was       pedunculated. The polyp was removed with a hot snare. Resection and       retrieval were complete. Estimated blood loss: none.      Scattered small-mouthed diverticula were found in the sigmoid colon.      Internal hemorrhoids were found during retroflexion. The hemorrhoids       were small and  Grade I (internal hemorrhoids that do not prolapse). Impression:               - Preparation of the colon was fair.                           - One 20 mm polyp in the ascending colon, removed                            with a hot snare. Resected and retrieved.                           - Diverticulosis in the sigmoid colon.                           - Internal hemorrhoids. Moderate Sedation:      N/A- Per Anesthesia Care Recommendation:           - Await pathology results.                           - No aspirin, ibuprofen, naproxen, or other                             non-steroidal anti-inflammatory drugs for 1 week.                           - Repeat colonoscopy for surveillance based on                            pathology results.                           - High fiber diet.                           - Patient has a contact number available for                            emergencies. The signs and symptoms of potential                            delayed complications were discussed with the                            patient. Return to normal activities tomorrow.                            Written discharge instructions were provided to the                            patient. Procedure Code(s):        --- Professional ---                           [phone], Colonoscopy, flexible; with removal of  tumor(s), polyp(s), or other lesion(s) by snare                            technique Diagnosis Code(s):        --- Professional ---                           Z12.11, Encounter for screening for malignant                            neoplasm of colon                           D12.2, Benign neoplasm of ascending colon                           K64.0, First degree hemorrhoids                           K57.30, Diverticulosis of large intestine without                            perforation or abscess without bleeding CPT copyright 2016 American Medical Association. All rights reserved. The codes documented in this report are preliminary and upon coder review may  be revised to meet current compliance requirements. Lear Ng, MD 09/01/2017 11:35:32 AM This report has been signed electronically. Number of Addenda: 0

## 2017-09-01 NOTE — Interval H&P Note (Signed)
History and Physical Interval Note:  09/01/2017 9:13 AM  Troy [name]  has presented today for surgery, with the diagnosis of Screening/dysphagia/reflux  The various methods of treatment have been discussed with the patient and family. After consideration of risks, benefits and other options for treatment, the patient has consented to  Procedure(s): COLONOSCOPY WITH PROPOFOL (N/A) ESOPHAGOGASTRODUODENOSCOPY (EGD) WITH PROPOFOL (N/A) as a surgical intervention .  The patient's history has been reviewed, patient examined, no change in status, stable for surgery.  I have reviewed the patient's chart and labs.  Questions were answered to the patient's satisfaction.     [place] C.

## 2017-10-05 ENCOUNTER — Encounter: Payer: Self-pay | Admitting: Podiatry

## 2017-10-05 ENCOUNTER — Ambulatory Visit: Payer: BLUE CROSS/BLUE SHIELD | Admitting: Podiatry

## 2017-10-05 DIAGNOSIS — M79675 Pain in left toe(s): Secondary | ICD-10-CM | POA: Diagnosis not present

## 2017-10-05 DIAGNOSIS — E0841 Diabetes mellitus due to underlying condition with diabetic mononeuropathy: Secondary | ICD-10-CM | POA: Diagnosis not present

## 2017-10-05 DIAGNOSIS — B351 Tinea unguium: Secondary | ICD-10-CM

## 2017-10-05 DIAGNOSIS — M79674 Pain in right toe(s): Secondary | ICD-10-CM | POA: Diagnosis not present

## 2017-10-12 ENCOUNTER — Encounter: Payer: Self-pay | Admitting: Podiatry

## 2017-10-12 NOTE — Progress Notes (Signed)
Subjective:   Patient ID: Troy [name], male   DOB: 52 y.o.   MRN: [id number]   HPI Patient presents with significant elongated nailbeds 1-5 both feet with long-term history of diabetes   ROS      Objective:  Physical Exam  Neurovascular status intact with thick yellow brittle nailbeds 1-5 both feet that are incurvated in the corners and tender     Assessment:  Mycotic nail infection with pain 1-5 both feet     Plan:  Debride painful nailbeds 1-5 both feet with no iatrogenic bleeding noted

## 2017-10-16 ENCOUNTER — Telehealth: Payer: Self-pay | Admitting: Podiatry

## 2017-10-16 NOTE — Telephone Encounter (Signed)
Called pt to discuss benefits but voicemail is full.

## 2017-11-04 NOTE — Telephone Encounter (Signed)
Pt returned call and I was on the other line and he left a message for me to call him back.  I returned call and pt said he did get prosthetic and shoes at [facility] but not at the same time. But when called insurance they are saying diabetic shoes/inserts are not covered so I think they may have billed the shoes and prosthetic at the same time to get it covered at [facility]. Pt would like a rx to go to [facility]. I can notify pt when ready to be picked up.

## 2017-11-04 NOTE — Telephone Encounter (Signed)
Left message for pt to call to discuss benefit coverage for diabetic shoes/inserts.

## 2017-11-06 NOTE — Telephone Encounter (Signed)
Notified pt that rx ready for [facility].

## 2017-11-19 ENCOUNTER — Encounter: Payer: Self-pay | Admitting: Podiatry

## 2017-11-19 NOTE — Progress Notes (Signed)
Troy [name] with [employer] requested medical records. Those medical records were e-mailed to [email or web address] at 10:00 am.

## 2018-01-04 ENCOUNTER — Other Ambulatory Visit: Payer: BLUE CROSS/BLUE SHIELD

## 2018-03-26 ENCOUNTER — Encounter (INDEPENDENT_AMBULATORY_CARE_PROVIDER_SITE_OTHER): Payer: Self-pay | Admitting: Orthopaedic Surgery

## 2018-03-26 ENCOUNTER — Ambulatory Visit (INDEPENDENT_AMBULATORY_CARE_PROVIDER_SITE_OTHER): Payer: BLUE CROSS/BLUE SHIELD | Admitting: Orthopaedic Surgery

## 2018-03-26 ENCOUNTER — Telehealth (INDEPENDENT_AMBULATORY_CARE_PROVIDER_SITE_OTHER): Payer: Self-pay

## 2018-03-26 VITALS — BP 154/92 | HR 82 | Ht 70.0 in | Wt 300.0 lb

## 2018-03-26 DIAGNOSIS — Z89512 Acquired absence of left leg below knee: Secondary | ICD-10-CM

## 2018-03-26 NOTE — Telephone Encounter (Signed)
Called and left VM advising patient to return our call to schedule an appointment for left stump.

## 2018-03-26 NOTE — Progress Notes (Signed)
Office Visit Note   Patient: Troy [name]           Date of Birth: Apr 12, 1966           MRN: [id number] Visit Date: 03/26/2018              Requested by: Seward Carol, MD [address] [place], [place] [address] PCP: Seward Carol, MD   Assessment & Plan: Visit Diagnoses:  1. History of left below knee amputation ([place])       With callus skin cracking and some intermittent bleeding.  Plan: He will go back to [facility] see if they can do some modification possibly grind out the stump if there is enough room.  Follow-Up Instructions: No follow-ups on file.   Orders:  No orders of the defined types were placed in this encounter.  No orders of the defined types were placed in this encounter.     Procedures: No procedures performed   Clinical Data: No additional findings.   Subjective: Chief Complaint  Patient presents with  . Left Leg - Wound Check    02/06/16 Left BKA    HPI 52 year old male post right BKA with diabetes on 10 units of Lantus insulin.  He has had problems with the tip of his stump over the tibia with callus formation and then cracking and intermittent bleeding this been going on for a few weeks.  He has a new socket that he got about 6 months ago.  He denies fever chills no purulent drainage.  Last A1c was 8 patient relates.  Review of Systems reviewed updated unchanged from previous last office visit.   Objective: Vital Signs: BP (!) 154/92   Pulse 82   Ht 5\' 10"  (1.778 m)   Wt 300 lb (136.1 kg)   BMI 43.05 kg/m   Physical Exam  Constitutional: He is oriented to person, place, and time. He appears well-developed and well-nourished.  HENT:  Head: Normocephalic and atraumatic.  Eyes: Pupils are equal, round, and reactive to light. EOM are normal.  Neck: No tracheal deviation present. No thyromegaly present.  Cardiovascular: Normal rate.  Pulmonary/Chest: Effort normal. He has no wheezes.  Abdominal: Soft. Bowel sounds are  normal.  Neurological: He is alert and oriented to person, place, and time.  Skin: Skin is warm and dry. Capillary refill takes less than 2 seconds.  Psychiatric: He has a normal mood and affect. His behavior is normal. Judgment and thought content normal.    Ortho Exam 2.  Shows a 2.5 cm callus formation with 2 skin cracks anterior posterior which right now is not bleeding no cellulitis.  Has some scarring over the tip of the stump from past skin problems.  Into the tibia and into the fibula shows no bony prominences.  No cellulitis.  No inguinal lymphadenopathy. Specialty Comments:  No specialty comments available.  Imaging: No results found.   PMFS History: Patient Active Problem List   Diagnosis Date Noted  . Dysphagia 09/01/2017  . Special screening for malignant neoplasms, colon 09/01/2017  . Esophageal reflux 09/01/2017  . Acute osteomyelitis of left foot ([place])   . Diabetic foot ([place]) 02/02/2016  . Osteomyelitis ([place]) 02/02/2016  . Osteomyelitis of foot, acute, left 02/02/2016  . Diabetic foot infection ([place]) 02/02/2016  . CKD (chronic kidney disease), stage II 02/02/2016  . Anemia 02/02/2016  . Kidney stones 11/30/2013  . Cholelithiasis: PER CT ABD/PELVIS AND ABD [place] 11/30/13 11/30/2013  . Acute renal failure ([place]) 11/29/2013  .  Acute pancreatitis 11/29/2013  . Asthma exacerbation 11/09/2013  . CAP (community acquired pneumonia) 07/18/2013  . Diabetes ([place]) 07/18/2013  . Hypertension    Past Medical History:  Diagnosis Date  . Asthma   . Bronchitis   . CKD (chronic kidney disease)   . Diabetes mellitus   . Diabetic Charcot foot ([place])    Left  . Gout   . Hypertension   . Kidney stones 11/30/2013  . Neuromuscular disorder ([place])   . Obesity   . Pancreatitis   . Polyneuropathy in diabetes [facility])     Family History  Problem Relation Age of Onset  . Diabetes Mellitus II Father   . Diabetes Father   . Hypertension Father   . Diabetes Mother   . Hypertension Mother    . CAD Neg Hx     Past Surgical History:  Procedure Laterality Date  . AMPUTATION Left 02/06/2016   Procedure: AMPUTATION BELOW KNEE;  Surgeon: Marybelle Killings, MD;  Location: [facility];  Service: Orthopedics;  Laterality: Left;  . CHOLECYSTECTOMY N/A 12/03/2013   Procedure: LAPAROSCOPIC CHOLECYSTECTOMY WITH INTRAOPERATIVE CHOLANGIOGRAM;  Surgeon: Rolm Bookbinder, MD;  Location: [place];  Service: General;  Laterality: N/A;  . COLONOSCOPY WITH PROPOFOL N/A 09/01/2017   Procedure: COLONOSCOPY WITH PROPOFOL;  Surgeon: Wilford Corner, MD;  Location: [facility];  Service: Endoscopy;  Laterality: N/A;  . ESOPHAGOGASTRODUODENOSCOPY (EGD) WITH PROPOFOL N/A 09/01/2017   Procedure: ESOPHAGOGASTRODUODENOSCOPY (EGD) WITH PROPOFOL;  Surgeon: Wilford Corner, MD;  [facility];  Service: Endoscopy;  Laterality: N/A;  . NO PAST SURGERIES     Social History   Occupational History  . Not on file  Tobacco Use  . Smoking status: Never Smoker  . Smokeless tobacco: Never Used  Substance and Sexual Activity  . Alcohol use: Yes    Comment: occasionally  . Drug use: No  . Sexual activity: Not on file

## 2018-04-16 ENCOUNTER — Ambulatory Visit (INDEPENDENT_AMBULATORY_CARE_PROVIDER_SITE_OTHER): Payer: BLUE CROSS/BLUE SHIELD | Admitting: Orthopaedic Surgery

## 2018-05-28 ENCOUNTER — Other Ambulatory Visit: Payer: Self-pay

## 2018-05-28 ENCOUNTER — Emergency Department (HOSPITAL_COMMUNITY)
Admission: EM | Admit: 2018-05-28 | Discharge: 2018-05-29 | Disposition: A | Discharge status: Home / Self Care | Payer: BLUE CROSS/BLUE SHIELD | Attending: Emergency Medicine | Admitting: Emergency Medicine

## 2018-05-28 ENCOUNTER — Encounter (HOSPITAL_COMMUNITY): Payer: Self-pay | Admitting: Emergency Medicine

## 2018-05-28 ENCOUNTER — Emergency Department (HOSPITAL_COMMUNITY): Payer: BLUE CROSS/BLUE SHIELD

## 2018-05-28 DIAGNOSIS — R05 Cough: Secondary | ICD-10-CM | POA: Insufficient documentation

## 2018-05-28 DIAGNOSIS — I129 Hypertensive chronic kidney disease with stage 1 through stage 4 chronic kidney disease, or unspecified chronic kidney disease: Secondary | ICD-10-CM | POA: Diagnosis not present

## 2018-05-28 DIAGNOSIS — R0602 Shortness of breath: Secondary | ICD-10-CM | POA: Diagnosis not present

## 2018-05-28 DIAGNOSIS — Z794 Long term (current) use of insulin: Secondary | ICD-10-CM | POA: Diagnosis not present

## 2018-05-28 DIAGNOSIS — E1122 Type 2 diabetes mellitus with diabetic chronic kidney disease: Secondary | ICD-10-CM | POA: Diagnosis not present

## 2018-05-28 DIAGNOSIS — Z79899 Other long term (current) drug therapy: Secondary | ICD-10-CM | POA: Insufficient documentation

## 2018-05-28 DIAGNOSIS — N182 Chronic kidney disease, stage 2 (mild): Secondary | ICD-10-CM | POA: Insufficient documentation

## 2018-05-28 DIAGNOSIS — R079 Chest pain, unspecified: Secondary | ICD-10-CM | POA: Diagnosis present

## 2018-05-28 DIAGNOSIS — J189 Pneumonia, unspecified organism: Secondary | ICD-10-CM

## 2018-05-28 DIAGNOSIS — E1140 Type 2 diabetes mellitus with diabetic neuropathy, unspecified: Secondary | ICD-10-CM | POA: Diagnosis not present

## 2018-05-28 DIAGNOSIS — R0789 Other chest pain: Secondary | ICD-10-CM | POA: Diagnosis not present

## 2018-05-28 DIAGNOSIS — J181 Lobar pneumonia, unspecified organism: Secondary | ICD-10-CM | POA: Diagnosis not present

## 2018-05-28 DIAGNOSIS — R509 Fever, unspecified: Secondary | ICD-10-CM | POA: Insufficient documentation

## 2018-05-28 LAB — BASIC METABOLIC PANEL
Anion gap: 10 (ref 5–15)
BUN: 19 mg/dL (ref 6–20)
CHLORIDE: 99 mmol/L (ref 98–111)
CO2: 24 mmol/L (ref 22–32)
Calcium: 8 mg/dL — ABNORMAL LOW (ref 8.9–10.3)
Creatinine, Ser: 1.18 mg/dL (ref 0.61–1.24)
GFR calc Af Amer: >60 mL/min (ref >60)
GFR calc non Af Amer: >60 mL/min (ref >60)
Glucose, Bld: 201 mg/dL — ABNORMAL HIGH (ref 70–99)
Potassium: 4.1 mmol/L (ref 3.5–5.1)
SODIUM: 133 mmol/L — AB (ref 135–145)

## 2018-05-28 LAB — CBC
HEMATOCRIT: 38.3 % — AB (ref 39.0–52.0)
HEMOGLOBIN: 12.6 g/dL — AB (ref 13.0–17.0)
MCH: 29.2 pg (ref 26.0–34.0)
MCHC: 32.9 g/dL (ref 30.0–36.0)
MCV: 88.7 fL (ref 80.0–100.0)
Platelets: 235 10*3/uL (ref 150–400)
RBC: 4.32 MIL/uL (ref 4.22–5.81)
RDW: 13.2 % (ref 11.5–15.5)
WBC: 13 10*3/uL — AB (ref 4.0–10.5)
nRBC: 0 % (ref 0.0–0.2)

## 2018-05-28 LAB — I-STAT TROPONIN, ED: Troponin i, poc: 0.03 ng/mL (ref 0.00–0.08)

## 2018-05-28 NOTE — ED Triage Notes (Signed)
C/o sharp pain across chest since yesterday with SOB.  Denies nausea and vomiting.  Reports cough x 1 week.  Seen by PCP and finished 5 days of antibiotics for URI.  Cough initially productive but now dry.

## 2018-05-29 MED ORDER — HYDROCOD POLST-CPM POLST ER 10-8 MG/5ML PO SUER
5.0000 mL | Freq: Once | ORAL | Status: AC
  Administered 2018-05-29: 5 mL via ORAL
  Filled 2018-05-29: qty 5

## 2018-05-29 MED ORDER — DOXYCYCLINE HYCLATE 100 MG PO CAPS: 100.0000 mg | ORAL_CAPSULE | Freq: Two times a day (BID) | ORAL | 0 refills | Status: DC

## 2018-05-29 MED ORDER — HYDROCOD POLST-CPM POLST ER 10-8 MG/5ML PO SUER: 5.0000 mL | Freq: Two times a day (BID) | ORAL | 0 refills | Status: DC | PRN

## 2018-05-29 MED ORDER — DOXYCYCLINE HYCLATE 100 MG PO TABS
100.0000 mg | ORAL_TABLET | Freq: Once | ORAL | Status: AC
  Administered 2018-05-29: 100 mg via ORAL
  Filled 2018-05-29: qty 1

## 2018-05-29 NOTE — ED Provider Notes (Signed)
[facility] EMERGENCY DEPARTMENT Provider Note   CSN: [id number] Arrival date & time: 05/28/18  2040     History   Chief Complaint Chief Complaint  Patient presents with  . Chest Pain  . Cough    HPI Ronav Furney is a 52 y.o. male.  Patient presents with chest pain across chest with cough or movement. He has had a cough for the past week with a low grade temperature of Tmax 99. He reports SOB with cough and with activity. No nausea, vomiting. He was seen by his doctor one week ago when symptoms started and was started on Zithromax which he finished today. He reports no improvement since beginning the medication. He has Albuterol nebulizer as well as inhaler at home which has become less effective on his cough.   The history is provided by the patient. No language interpreter was used.  Chest Pain   Associated symptoms include cough and shortness of breath. Pertinent negatives include no abdominal pain, no diaphoresis, no fever, no nausea and no vomiting.  Cough  Associated symptoms include chest pain and shortness of breath. Pertinent negatives include no myalgias.    Past Medical History:  Diagnosis Date  . Asthma   . Bronchitis   . CKD (chronic kidney disease)   . Diabetes mellitus   . Diabetic Charcot foot ([place])    Left  . Gout   . Hypertension   . Kidney stones 11/30/2013  . Neuromuscular disorder ([place])   . Obesity   . Pancreatitis   . Polyneuropathy in diabetes [facility])     Patient Active Problem List   Diagnosis Date Noted  . History of left below knee amputation ([place]) 03/26/2018  . Dysphagia 09/01/2017  . Special screening for malignant neoplasms, colon 09/01/2017  . Esophageal reflux 09/01/2017  . Acute osteomyelitis of left foot ([place])   . Diabetic foot ([place]) 02/02/2016  . Osteomyelitis ([place]) 02/02/2016  . Osteomyelitis of foot, acute, left 02/02/2016  . CKD (chronic kidney disease), stage II 02/02/2016  . Anemia 02/02/2016  . Kidney  stones 11/30/2013  . Cholelithiasis: PER CT ABD/PELVIS AND ABD [place] 11/30/13 11/30/2013  . Acute renal failure ([place]) 11/29/2013  . Acute pancreatitis 11/29/2013  . Asthma exacerbation 11/09/2013  . CAP (community acquired pneumonia) 07/18/2013  . Diabetes ([place]) 07/18/2013  . Hypertension     Past Surgical History:  Procedure Laterality Date  . AMPUTATION Left 02/06/2016   Procedure: AMPUTATION BELOW KNEE;  Surgeon: Marybelle Killings, MD;  Location: [place];  Service: Orthopedics;  Laterality: Left;  . CHOLECYSTECTOMY N/A 12/03/2013   Procedure: LAPAROSCOPIC CHOLECYSTECTOMY WITH INTRAOPERATIVE CHOLANGIOGRAM;  Surgeon: Rolm Bookbinder, MD;  Location: [place];  Service: General;  Laterality: N/A;  . COLONOSCOPY WITH PROPOFOL N/A 09/01/2017   Procedure: COLONOSCOPY WITH PROPOFOL;  Surgeon: Wilford Corner, MD;  [facility];  Service: Endoscopy;  Laterality: N/A;  . ESOPHAGOGASTRODUODENOSCOPY (EGD) WITH PROPOFOL N/A 09/01/2017   Procedure: ESOPHAGOGASTRODUODENOSCOPY (EGD) WITH PROPOFOL;  Surgeon: Wilford Corner, MD;  [facility];  Service: Endoscopy;  Laterality: N/A;  . NO PAST SURGERIES          Home Medications    Prior to Admission medications   Medication Sig Start Date End Date Taking? Authorizing Provider  acetaminophen (TYLENOL) 500 MG tablet Take 1,000 mg by mouth every 6 (six) hours as needed for moderate pain.    [provider]  albuterol (PROVENTIL HFA;VENTOLIN HFA) 108 (90 Base) MCG/ACT inhaler Inhale 1-2 puffs into the  lungs every 6 (six) hours as needed for wheezing or shortness of breath. 08/25/15   Hyman Bible, PA-C  albuterol (PROVENTIL) (2.5 MG/3ML) 0.083% nebulizer solution Take 2.5 mg by nebulization every 4 (four) hours as needed for wheezing.  06/11/16   [provider]  aspirin (ASPIRIN EC) 81 MG EC tablet Take 81 mg by mouth daily. Swallow whole.    [provider]  atorvastatin (LIPITOR) 10 MG tablet Take 10 mg by mouth  daily. 03/06/17   [provider]  BREO ELLIPTA 100-25 MCG/INH AEPB Inhale 1 puff into the lungs daily.  07/31/15   [provider]  citalopram (CELEXA) 10 MG tablet Take 10 mg by mouth daily. 03/06/17   [provider]  COLCRYS 0.6 MG tablet Take 0.6 mg by mouth daily as needed. 06/23/17   [provider]  CONTOUR NEXT TEST test strip 1 each daily as needed. 03/24/17   [provider]  cyclobenzaprine (FLEXERIL) 10 MG tablet Take 0.5-1 tablets (5-10 mg total) by mouth 2 (two) times daily as needed. Patient not taking: Reported on 03/26/2018 06/03/15   Delos Haring, PA-C  doxycycline (VIBRA-TABS) 100 MG tablet Take 1 tablet (100 mg total) by mouth 2 (two) times daily. Patient not taking: Reported on 03/26/2018 09/12/16   Robyn Haber, MD  fluticasone [place]) 50 MCG/ACT nasal spray Place 2 sprays into both nostrils daily. 03/47/42   Delora Fuel, MD  glimepiride (AMARYL) 4 MG tablet Take 4 mg by mouth daily with breakfast.    [provider]  LANTUS SOLOSTAR 100 UNIT/ML Solostar Pen Inject 10 Units into the skin every morning.  01/10/16   [provider]  lisinopril-hydrochlorothiazide (PRINZIDE,ZESTORETIC) 10-12.5 MG tablet Take 1 tablet by mouth daily.  05/06/16   [provider]  metFORMIN (GLUCOPHAGE) 1000 MG tablet Take 1,000 mg by mouth 2 (two) times daily. 03/16/17   [provider]  MICROLET LANCETS MISC 1 each daily as needed. 02/16/17   [provider]  Multiple Vitamin (MULTIVITAMIN WITH MINERALS) TABS tablet Take 1 tablet by mouth daily.    [provider]  mupirocin ointment (BACTROBAN) 2 % Apply 1 application topically 3 (three) times daily. 09/12/16   Robyn Haber, MD  ondansetron (ZOFRAN ODT) 4 MG disintegrating tablet 4mg  ODT q4 hours prn nausea/vomit 04/10/17   Malvin Johns, MD  pantoprazole (PROTONIX) 40 MG tablet Take 40 mg by mouth daily.  06/11/16   [provider]    traMADol (ULTRAM) 50 MG tablet Take 50 mg by mouth every 12 (twelve) hours as needed for moderate pain.  03/19/17   [provider]  traZODone (DESYREL) 50 MG tablet Take 50 mg by mouth at bedtime. 03/24/17   [provider]  VIAGRA 100 MG tablet Take 100 mg by mouth as needed for erectile dysfunction.  06/19/14   [provider]    Family History Family History  Problem Relation Age of Onset  . Diabetes Mellitus II Father   . Diabetes Father   . Hypertension Father   . Diabetes Mother   . Hypertension Mother   . CAD Neg Hx     Social History Social History   Tobacco Use  . Smoking status: Never Smoker  . Smokeless tobacco: Never Used  Substance Use Topics  . Alcohol use: Yes    Comment: occasionally  . Drug use: No     Allergies   No known allergies   Review of Systems Review of Systems  Constitutional: Negative for  diaphoresis and fever.  HENT: Negative for congestion.   Respiratory: Positive for cough and shortness of breath.   Cardiovascular: Positive for chest pain. Negative for leg swelling.  Gastrointestinal: Negative for abdominal pain, nausea and vomiting.  Musculoskeletal: Negative for myalgias.  Skin: Negative for rash.     Physical Exam Updated Vital Signs BP (!) 146/87 (BP Location: Left Arm)   Pulse 92   Temp 99.5 F (37.5 C) (Oral)   Resp 18   Ht 5\' 10"  (1.778 m)   Wt (!) 140.6 kg   SpO2 94%   BMI 44.48 kg/m   Physical Exam  Constitutional: He is oriented to person, place, and time. He appears well-developed and well-nourished.  HENT:  Head: Normocephalic.  Neck: Normal range of motion. Neck supple.  Cardiovascular: Normal rate and regular rhythm.  No murmur heard. Pulmonary/Chest: Effort normal. He has decreased breath sounds in the left lower field. He has rales in the left lower field.  Abdominal: Soft. Bowel sounds are normal. There is no tenderness. There is no rebound and no guarding.  Musculoskeletal:  Normal range of motion.  Left BKA  Neurological: He is alert and oriented to person, place, and time.  Skin: Skin is warm and dry. No rash noted.  Psychiatric: He has a normal mood and affect.     ED Treatments / Results  Labs (all labs ordered are listed, but only abnormal results are displayed) Labs Reviewed  BASIC METABOLIC PANEL - Abnormal; Notable for the following components:      Result Value   Sodium 133 (*)    Glucose, Bld 201 (*)    Calcium 8.0 (*)    All other components within normal limits  CBC - Abnormal; Notable for the following components:   WBC 13.0 (*)    Hemoglobin 12.6 (*)    HCT 38.3 (*)    All other components within normal limits  I-STAT TROPONIN, ED    EKG None  Radiology Dg Chest 2 View  Result Date: 05/28/2018 CLINICAL DATA:  Chest pain and shortness of breath EXAM: CHEST - 2 VIEW COMPARISON:  04/10/2017 FINDINGS: Cardiac shadows within normal limits. The lungs are well aerated bilaterally. Mild left basilar atelectasis/infiltrate is seen. No sizable effusion is noted. No bony abnormality is seen. IMPRESSION: Left basilar infiltrate/atelectasis is noted. Electronically Signed   By: Inez Catalina M.D.   On: 05/28/2018 21:29    Procedures Procedures (including critical care time)  Medications Ordered in ED Medications  chlorpheniramine-HYDROcodone (TUSSIONEX) 10-8 MG/5ML suspension 5 mL (has no administration in time range)     Initial Impression / Assessment and Plan / ED Course  I have reviewed the triage vital signs and the nursing notes.  Pertinent labs & imaging results that were available during my care of the patient were reviewed by me and considered in my medical decision making (see chart for details).     Patient to ED with persistent, nonproductive cough that causes chest discomfort and SOB. No fever above 99. Treated over the last 5 days with Z-Pack that was finished today.   The patient has no evidence of sepsis. He is  breathing easily without wheezing, hypoxia or tachypnea. He is ambulated and maintains 92-96% O2 saturations.   His chest x-ray is showing infiltrates requiring continuation of antibiotics. Will start Doxycycline x 10 days. Will provide Tussionex for cough and chest discomfort. He will need to have close follow up with his PCP this coming week for recheck to insure  improvement. Strict return precautions discussed. The patient is comfortable with plan of discharge.  Final Clinical Impressions(s) / ED Diagnoses   Final diagnoses:  None   1.  Pneumonia  ED Discharge Orders    None       Charlann Lange, PA-C 05/29/18 0217    Ward, Delice Bison, DO 05/29/18 [phone]

## 2018-05-29 NOTE — Discharge Instructions (Addendum)
Call your doctor to make an appointment for recheck in 3-4 days. Return to the emergency department with any worsening symptoms or new concerns.

## 2018-05-29 NOTE — ED Notes (Signed)
SPO2 99% at rest on RA. While ambulating SPO2 89-90% RA. Up to 97% with rest on RA

## 2018-05-29 NOTE — ED Notes (Signed)
SPO2 99% at rest on room air. Dropped to 89-90% while ambulating on RA.

## 2018-06-02 ENCOUNTER — Encounter (HOSPITAL_COMMUNITY): Payer: Self-pay | Admitting: Pharmacy Technician

## 2018-06-02 ENCOUNTER — Emergency Department (HOSPITAL_COMMUNITY): Payer: BLUE CROSS/BLUE SHIELD

## 2018-06-02 ENCOUNTER — Other Ambulatory Visit: Payer: Self-pay

## 2018-06-02 ENCOUNTER — Emergency Department (HOSPITAL_COMMUNITY)
Admission: EM | Admit: 2018-06-02 | Discharge: 2018-06-02 | Disposition: A | Discharge status: Home / Self Care | Payer: BLUE CROSS/BLUE SHIELD | Attending: Emergency Medicine | Admitting: Emergency Medicine

## 2018-06-02 DIAGNOSIS — J45909 Unspecified asthma, uncomplicated: Secondary | ICD-10-CM | POA: Insufficient documentation

## 2018-06-02 DIAGNOSIS — Z7982 Long term (current) use of aspirin: Secondary | ICD-10-CM | POA: Insufficient documentation

## 2018-06-02 DIAGNOSIS — R0602 Shortness of breath: Secondary | ICD-10-CM | POA: Diagnosis not present

## 2018-06-02 DIAGNOSIS — E119 Type 2 diabetes mellitus without complications: Secondary | ICD-10-CM | POA: Insufficient documentation

## 2018-06-02 DIAGNOSIS — N182 Chronic kidney disease, stage 2 (mild): Secondary | ICD-10-CM | POA: Diagnosis not present

## 2018-06-02 DIAGNOSIS — J189 Pneumonia, unspecified organism: Secondary | ICD-10-CM | POA: Diagnosis not present

## 2018-06-02 DIAGNOSIS — I129 Hypertensive chronic kidney disease with stage 1 through stage 4 chronic kidney disease, or unspecified chronic kidney disease: Secondary | ICD-10-CM | POA: Insufficient documentation

## 2018-06-02 DIAGNOSIS — Z7984 Long term (current) use of oral hypoglycemic drugs: Secondary | ICD-10-CM | POA: Diagnosis not present

## 2018-06-02 DIAGNOSIS — J181 Lobar pneumonia, unspecified organism: Secondary | ICD-10-CM

## 2018-06-02 DIAGNOSIS — R05 Cough: Secondary | ICD-10-CM | POA: Diagnosis present

## 2018-06-02 LAB — COMPREHENSIVE METABOLIC PANEL
ALBUMIN: 3.1 g/dL — AB (ref 3.5–5.0)
ALT: 22 U/L (ref 0–44)
AST: 20 U/L (ref 15–41)
Alkaline Phosphatase: 55 U/L (ref 38–126)
Anion gap: 11 (ref 5–15)
BUN: 19 mg/dL (ref 6–20)
CO2: 23 mmol/L (ref 22–32)
Calcium: 8.7 mg/dL — ABNORMAL LOW (ref 8.9–10.3)
Chloride: 101 mmol/L (ref 98–111)
Creatinine, Ser: 1.14 mg/dL (ref 0.61–1.24)
GFR calc Af Amer: >60 mL/min (ref >60)
GFR calc non Af Amer: >60 mL/min (ref >60)
GLUCOSE: 202 mg/dL — AB (ref 70–99)
POTASSIUM: 4.4 mmol/L (ref 3.5–5.1)
Sodium: 135 mmol/L (ref 135–145)
Total Bilirubin: 0.6 mg/dL (ref 0.3–1.2)
Total Protein: 7.8 g/dL (ref 6.5–8.1)

## 2018-06-02 LAB — I-STAT TROPONIN, ED: Troponin i, poc: 0.01 ng/mL (ref 0.00–0.08)

## 2018-06-02 LAB — CBC WITH DIFFERENTIAL/PLATELET
ABS IMMATURE GRANULOCYTES: 0.12 10*3/uL — AB (ref 0.00–0.07)
BASOS ABS: 0 10*3/uL (ref 0.0–0.1)
BASOS PCT: 0 %
EOS ABS: 0.2 10*3/uL (ref 0.0–0.5)
Eosinophils Relative: 2 %
HCT: 37.2 % — ABNORMAL LOW (ref 39.0–52.0)
Hemoglobin: 11.9 g/dL — ABNORMAL LOW (ref 13.0–17.0)
IMMATURE GRANULOCYTES: 1 %
Lymphocytes Relative: 21 %
Lymphs Abs: 2.7 10*3/uL (ref 0.7–4.0)
MCH: 28.1 pg (ref 26.0–34.0)
MCHC: 32 g/dL (ref 30.0–36.0)
MCV: 87.7 fL (ref 80.0–100.0)
MONOS PCT: 8 %
Monocytes Absolute: 1.1 10*3/uL — ABNORMAL HIGH (ref 0.1–1.0)
NEUTROS PCT: 68 %
Neutro Abs: 8.6 10*3/uL — ABNORMAL HIGH (ref 1.7–7.7)
PLATELETS: 352 10*3/uL (ref 150–400)
RBC: 4.24 MIL/uL (ref 4.22–5.81)
RDW: 13.2 % (ref 11.5–15.5)
WBC: 12.7 10*3/uL — ABNORMAL HIGH (ref 4.0–10.5)
nRBC: 0 % (ref 0.0–0.2)

## 2018-06-02 LAB — BRAIN NATRIURETIC PEPTIDE: B NATRIURETIC PEPTIDE 5: 77.4 pg/mL (ref 0.0–100.0)

## 2018-06-02 MED ORDER — PREDNISONE 10 MG PO TABS: 40.0000 mg | ORAL_TABLET | Freq: Every day | ORAL | 0 refills | Status: AC

## 2018-06-02 MED ORDER — IOPAMIDOL (ISOVUE-370) INJECTION 76%
INTRAVENOUS | Status: AC
  Filled 2018-06-02: qty 100

## 2018-06-02 MED ORDER — CEPHALEXIN 500 MG PO CAPS: 500.0000 mg | ORAL_CAPSULE | Freq: Three times a day (TID) | ORAL | 0 refills | Status: AC

## 2018-06-02 MED ORDER — IOPAMIDOL (ISOVUE-370) INJECTION 76%
100.0000 mL | Freq: Once | INTRAVENOUS | Status: AC | PRN
  Administered 2018-06-02: 77 mL via INTRAVENOUS

## 2018-06-02 MED ORDER — DOXYCYCLINE HYCLATE 50 MG PO CAPS: 50.0000 mg | ORAL_CAPSULE | Freq: Two times a day (BID) | ORAL | 0 refills | Status: AC

## 2018-06-02 NOTE — ED Triage Notes (Signed)
Pt arrives POV from MD office after recheck for PNA. Pt reports abx are not working, feels worse. CO SOB and L sided rib pain and fever. 99.5tmax. Pt in NAD upon arrival.

## 2018-06-02 NOTE — ED Provider Notes (Signed)
[place] EMERGENCY DEPARTMENT Provider Note   CSN: [id number] Arrival date & time: 06/02/18  1106     History   Chief Complaint Chief Complaint  Patient presents with  . Shortness of Breath    HPI Troy [name] is a 52 y.o. male.  HPI  Presents with concern for continuing cough, dyspnea, and chest pain after recent pneumonia diagnosis and treatment. Had seen physician greater than 1 week ago and was given azithromycin, however symptoms continued and presented to the ED 11/1. XR concerning for pneumonia at that time and given rx for doxycycline but has not felt improvement. Continues to have CP which comes and goes, sharp, there with cough, movements, leaning over, sometimes opain in neck.  No asymmetric leg swelling. Hx of asthma and dyspnea improved with nebulizer at home. No hx of PE/DVT.   Past Medical History:  Diagnosis Date  . Asthma   . Bronchitis   . CKD (chronic kidney disease)   . Diabetes mellitus   . Diabetic Charcot foot ([place])    Left  . Gout   . Hypertension   . Kidney stones 11/30/2013  . Neuromuscular disorder ([place])   . Obesity   . Pancreatitis   . Polyneuropathy in diabetes [facility])     Patient Active Problem List   Diagnosis Date Noted  . History of left below knee amputation ([place]) 03/26/2018  . Dysphagia 09/01/2017  . Special screening for malignant neoplasms, colon 09/01/2017  . Esophageal reflux 09/01/2017  . Acute osteomyelitis of left foot ([place])   . Diabetic foot ([place]) 02/02/2016  . Osteomyelitis ([place]) 02/02/2016  . Osteomyelitis of foot, acute, left 02/02/2016  . CKD (chronic kidney disease), stage II 02/02/2016  . Anemia 02/02/2016  . Kidney stones 11/30/2013  . Cholelithiasis: PER CT ABD/PELVIS AND ABD [place] 11/30/13 11/30/2013  . Acute renal failure ([place]) 11/29/2013  . Acute pancreatitis 11/29/2013  . Asthma exacerbation 11/09/2013  . CAP (community acquired pneumonia) 07/18/2013  . Diabetes ([place]) 07/18/2013  .  Hypertension     Past Surgical History:  Procedure Laterality Date  . AMPUTATION Left 02/06/2016   Procedure: AMPUTATION BELOW KNEE;  Surgeon: Marybelle Killings, MD;  Location: [place];  Service: Orthopedics;  Laterality: Left;  . CHOLECYSTECTOMY N/A 12/03/2013   Procedure: LAPAROSCOPIC CHOLECYSTECTOMY WITH INTRAOPERATIVE CHOLANGIOGRAM;  Surgeon: Rolm Bookbinder, MD;  Location: [place];  Service: General;  Laterality: N/A;  . COLONOSCOPY WITH PROPOFOL N/A 09/01/2017   Procedure: COLONOSCOPY WITH PROPOFOL;  Surgeon: Wilford Corner, MD;  Location: [facility];  Service: Endoscopy;  Laterality: N/A;  . ESOPHAGOGASTRODUODENOSCOPY (EGD) WITH PROPOFOL N/A 09/01/2017   Procedure: ESOPHAGOGASTRODUODENOSCOPY (EGD) WITH PROPOFOL;  Surgeon: Wilford Corner, MD;  Location: [facility];  Service: Endoscopy;  Laterality: N/A;  . NO PAST SURGERIES          Home Medications    Prior to Admission medications   Medication Sig Start Date End Date Taking? Authorizing Provider  albuterol (PROVENTIL HFA;VENTOLIN HFA) 108 (90 Base) MCG/ACT inhaler Inhale 1-2 puffs into the lungs every 6 (six) hours as needed for wheezing or shortness of breath. 08/25/15   Hyman Bible, PA-C  albuterol (PROVENTIL) (2.5 MG/3ML) 0.083% nebulizer solution Take 2.5 mg by nebulization every 4 (four) hours as needed for wheezing.  06/11/16   [provider]  aspirin (ASPIRIN EC) 81 MG EC tablet Take 81 mg by mouth daily. Swallow whole.    [provider]  atorvastatin (LIPITOR) 10 MG tablet Take 10 mg by  mouth daily. 03/06/17   [provider]  azithromycin (ZITHROMAX) 250 MG tablet Take 250-500 mg by mouth See admin instructions. Take 2 tablets on day 1 then take 1 tablet the next 4 days 05/24/18   [provider]  BREO ELLIPTA 100-25 MCG/INH AEPB Inhale 1 puff into the lungs daily.  07/31/15   [provider]  cephALEXin (KEFLEX) 500 MG capsule Take 1 capsule (500 mg total) by mouth 3 (three)  times daily for 10 days. 06/02/18 06/12/18  Gareth Morgan, MD  chlorpheniramine-HYDROcodone (TUSSIONEX PENNKINETIC ER) 10-8 MG/5ML SUER Take 5 mLs by mouth every 12 (twelve) hours as needed for cough. 05/29/18   Charlann Lange, PA-C  citalopram (CELEXA) 10 MG tablet Take 10 mg by mouth daily. 03/06/17   [provider]  CONTOUR NEXT TEST test strip 1 each daily as needed. 03/24/17   [provider]  cyclobenzaprine (FLEXERIL) 10 MG tablet Take 0.5-1 tablets (5-10 mg total) by mouth 2 (two) times daily as needed. Patient not taking: Reported on 03/26/2018 06/03/15   Delos Haring, PA-C  doxycycline (VIBRAMYCIN) 100 MG capsule Take 1 capsule (100 mg total) by mouth 2 (two) times daily. 05/29/18   Charlann Lange, PA-C  doxycycline (VIBRAMYCIN) 50 MG capsule Take 1 capsule (50 mg total) by mouth 2 (two) times daily for 4 days. Additional days to previous prescription of doxycycline (continue for 4 more days) 06/02/18 06/06/18  Gareth Morgan, MD  fluticasone (FLONASE) 50 MCG/ACT nasal spray Place 2 sprays into both nostrils daily. 25/95/63   Delora Fuel, MD  glimepiride (AMARYL) 4 MG tablet Take 4 mg by mouth daily with breakfast.    [provider]  HYDROcodone-homatropine (HYCODAN) 5-1.5 MG/5ML syrup Take 5 mLs by mouth 2 (two) times daily. 05/24/18   [provider]  LANTUS SOLOSTAR 100 UNIT/ML Solostar Pen Inject 10 Units into the skin at bedtime.  01/10/16   [provider]  lisinopril-hydrochlorothiazide (PRINZIDE,ZESTORETIC) 10-12.5 MG tablet Take 1 tablet by mouth daily.  05/06/16   [provider]  metFORMIN (GLUCOPHAGE) 1000 MG tablet Take 1,000 mg by mouth 2 (two) times daily. 03/16/17   [provider]  MICROLET LANCETS MISC 1 each daily as needed. 02/16/17   [provider]  Multiple Vitamin (MULTIVITAMIN WITH MINERALS) TABS tablet Take 1 tablet by mouth daily.    [provider]  mupirocin ointment (BACTROBAN) 2 %  Apply 1 application topically 3 (three) times daily. Patient not taking: Reported on 05/29/2018 09/12/16   Robyn Haber, MD  ondansetron (ZOFRAN ODT) 4 MG disintegrating tablet 4mg  ODT q4 hours prn nausea/vomit Patient not taking: Reported on 05/29/2018 04/10/17   Malvin Johns, MD  pantoprazole (PROTONIX) 40 MG tablet Take 40 mg by mouth daily.  06/11/16   [provider]  predniSONE (DELTASONE) 10 MG tablet Take 4 tablets (40 mg total) by mouth daily for 4 days. 06/02/18 06/06/18  Gareth Morgan, MD    Family History Family History  Problem Relation Age of Onset  . Diabetes Mellitus II Father   . Diabetes Father   . Hypertension Father   . Diabetes Mother   . Hypertension Mother   . CAD Neg Hx     Social History Social History   Tobacco Use  . Smoking status: Never Smoker  . Smokeless tobacco: Never Used  Substance Use Topics  . Alcohol use: Yes    Comment: occasionally  . Drug use: No     Allergies   No known allergies  Review of Systems Review of Systems  Constitutional: Negative for fever. Diaphoresis: at night, mild.  HENT: Negative for sore throat.   Eyes: Negative for visual disturbance.  Respiratory: Positive for cough and shortness of breath.   Cardiovascular: Positive for chest pain.  Gastrointestinal: Positive for nausea. Negative for abdominal pain, diarrhea and vomiting.  Genitourinary: Negative for difficulty urinating.  Musculoskeletal: Negative for back pain and neck stiffness.  Skin: Negative for rash.  Neurological: Negative for syncope and headaches.     Physical Exam Updated Vital Signs BP (!) 148/87   Pulse 93   Temp 98.5 F (36.9 C) (Oral)   Resp 17   Ht 5\' 10"  (1.778 m)   Wt (!) 140 kg   SpO2 97%   BMI 44.29 kg/m   Physical Exam  Constitutional: He is oriented to person, place, and time. He appears well-developed and well-nourished. No distress.  HENT:  Head: Normocephalic and atraumatic.  Eyes: Conjunctivae and  EOM are normal.  Neck: Normal range of motion.  Cardiovascular: Normal rate, regular rhythm, normal heart sounds and intact distal pulses. Exam reveals no gallop and no friction rub.  No murmur heard. Pulmonary/Chest: Effort normal and breath sounds normal. No respiratory distress. He has no wheezes. He has no rales.  Abdominal: Soft. He exhibits no distension. There is no tenderness. There is no guarding.  Musculoskeletal: He exhibits no edema.       Right lower leg: He exhibits no edema.  Neurological: He is alert and oriented to person, place, and time.  Skin: Skin is warm and dry. He is not diaphoretic.  Nursing note and vitals reviewed.    ED Treatments / Results  Labs (all labs ordered are listed, but only abnormal results are displayed) Labs Reviewed  CBC WITH DIFFERENTIAL/PLATELET - Abnormal; Notable for the following components:      Result Value   WBC 12.7 (*)    Hemoglobin 11.9 (*)    HCT 37.2 (*)    Neutro Abs 8.6 (*)    Monocytes Absolute 1.1 (*)    Abs Immature Granulocytes 0.12 (*)    All other components within normal limits  COMPREHENSIVE METABOLIC PANEL - Abnormal; Notable for the following components:   Glucose, Bld 202 (*)    Calcium 8.7 (*)    Albumin 3.1 (*)    All other components within normal limits  BRAIN NATRIURETIC PEPTIDE  I-STAT TROPONIN, ED    EKG EKG Interpretation  Date/Time:  Wednesday June 02 2018 11:17:12 EST Ventricular Rate:  103 PR Interval:    QRS Duration: 101 QT Interval:  371 QTC Calculation: 486 R Axis:   62 Text Interpretation:  Sinus tachycardia Low voltage with right axis deviation Borderline prolonged QT interval Baseline wander in lead(s) V1 V5 No significant change since last tracing Confirmed by Gareth Morgan [phone]) on 06/02/2018 3:02:44 PM   Radiology Ct Angio Chest Pe W And/or Wo Contrast  Result Date: 06/02/2018 CLINICAL DATA:  Shortness of breath and left chest pain for 3 days. Elevated white blood cell  count. EXAM: CT ANGIOGRAPHY CHEST WITH CONTRAST TECHNIQUE: Multidetector CT imaging of the chest was performed using the standard protocol during bolus administration of intravenous contrast. Multiplanar CT image reconstructions and MIPs were obtained to evaluate the vascular anatomy. CONTRAST:  77 mL ISOVUE-370 IOPAMIDOL (ISOVUE-370) INJECTION 76% COMPARISON:  PA and lateral chest 05/28/2018.  CT chest 04/10/2017. FINDINGS: Cardiovascular: No pulmonary embolus is identified. There is cardiomegaly. No pericardial effusion. No calcific atherosclerosis is identified.  No aneurysm. Mediastinum/Nodes: No enlarged mediastinal, hilar, or axillary lymph nodes. Thyroid gland, trachea, and esophagus demonstrate no significant findings. Lungs/Pleura: The patient has a small left pleural effusion. No right effusion. Airspace opacity in the inferior left lower lobe has an appearance worrisome for pneumonia. Minimal dependent atelectasis on the right is also seen. Upper Abdomen: Fatty infiltration of the liver is seen. No acute or focal abnormality. The patient is status post cholecystectomy. Musculoskeletal: No acute or focal abnormality. Flowing ossification of the anterior longitudinal ligament at multiple levels consistent with DISH noted. Review of the MIP images confirms the above findings. IMPRESSION: Left lower lobe airspace has an appearance most compatible with pneumonia. Small left effusion noted. Negative for pulmonary embolus. Cardiomegaly. Fatty infiltration of the liver. Electronically Signed   By: Inge Rise M.D.   On: 06/02/2018 13:57    Procedures Procedures (including critical care time)  Medications Ordered in ED Medications  iopamidol (ISOVUE-370) 76 % injection 100 mL (77 mLs Intravenous Contrast Given 06/02/18 1343)     Initial Impression / Assessment and Plan / ED Course  I have reviewed the triage vital signs and the nursing notes.  Pertinent labs & imaging results that were available  during my care of the patient were reviewed by me and considered in my medical decision making (see chart for details).     52yo male with history of asthma, htn, DM, L BKA,presents with concern for continuing cough, chest pain, dyspnea after initiating outpt abx for pneumonia.  DDx include pneumonia, PE, bronchitis, asthma exacerbation, pneumothorax, ACS.    EKG shows sinus tachycardia. Troponin negative. CT PE study done showing no evidence of PE, does show left lower lobe opacity consistent with pneumonia.  CP atypical, worse with small movements, more likely secondary to pneumonia and MSK.  Discussed it is reasonable to admit the patient for failure of outpt abx for treatment of pneumonia, however given no hypoxia, normal blood pressures, no tachypnea, no renal failure, I feel it is also reasonable to continue outpatient management.  Pt would like to continue outpt management. Able to ambulate to bathroom independently without significant symptoms.    Given rx for keflex, additional doxycycline to complete course, and rx for prednisone and rec for more albuerol given possible component of asthma/bronchitis. Patient discharged in stable condition with understanding of reasons to return.   Final Clinical Impressions(s) / ED Diagnoses   Final diagnoses:  Community acquired pneumonia of left lower lobe of lung [facility])    ED Discharge Orders         Ordered    predniSONE (DELTASONE) 10 MG tablet  Daily     06/02/18 1457    cephALEXin (KEFLEX) 500 MG capsule  3 times daily     06/02/18 1457    doxycycline (VIBRAMYCIN) 50 MG capsule  2 times daily     06/02/18 1457           Gareth Morgan, MD 06/02/18 2155

## 2018-06-02 NOTE — ED Notes (Signed)
Patient verbalizes understanding of discharge instructions. Opportunity for questioning and answers were provided. 

## 2018-06-02 NOTE — ED Notes (Signed)
Patient transported to CT 

## 2018-06-02 NOTE — ED Notes (Signed)
Pt ambulated to bathroom. Upon standing, Pt initially seem unbalanced but was able to walk slowly and steadily to restroom. No complaints of dizziness or weakness at this time. Pt did not need assistance at any point.

## 2018-06-02 NOTE — ED Notes (Signed)
Pt transported to CT ?

## 2018-06-02 NOTE — ED Notes (Signed)
Pt returned from CT °

## 2018-06-09 ENCOUNTER — Ambulatory Visit (INDEPENDENT_AMBULATORY_CARE_PROVIDER_SITE_OTHER): Payer: BLUE CROSS/BLUE SHIELD | Admitting: Podiatry

## 2018-06-09 ENCOUNTER — Encounter: Payer: Self-pay | Admitting: Podiatry

## 2018-06-09 DIAGNOSIS — B351 Tinea unguium: Secondary | ICD-10-CM | POA: Diagnosis not present

## 2018-06-09 DIAGNOSIS — M79674 Pain in right toe(s): Secondary | ICD-10-CM | POA: Diagnosis not present

## 2018-06-09 DIAGNOSIS — M79675 Pain in left toe(s): Secondary | ICD-10-CM

## 2018-06-09 DIAGNOSIS — M21619 Bunion of unspecified foot: Secondary | ICD-10-CM

## 2018-06-10 NOTE — Progress Notes (Signed)
Subjective:   Patient ID: Troy [name], male   DOB: 52 y.o.   MRN: [id number]   HPI Patient has diabetes and was worried about several blisters that he had which have resolved but he wants to have them checked   ROS      Objective:  Physical Exam  Neurovascular status intact with patient found to have slight blisters of the right hallux but it is localized with no drainage noted no proximal edema erythema or drainage noted.     Assessment:  Probable reaction to a tighter type of shoes that he were at work with some reactivity that is localized     Plan:  Advised him on this and explained to him the continuation of conservative treatment with soaks bandage therapy and he will be seen back on an as-needed basis but I do not see anything to be concerned about here currently

## 2018-06-27 ENCOUNTER — Emergency Department (HOSPITAL_COMMUNITY): Payer: BLUE CROSS/BLUE SHIELD

## 2018-06-27 ENCOUNTER — Encounter (HOSPITAL_COMMUNITY): Payer: Self-pay

## 2018-06-27 ENCOUNTER — Emergency Department (HOSPITAL_COMMUNITY)
Admission: EM | Admit: 2018-06-27 | Discharge: 2018-06-27 | Disposition: A | Discharge status: Home / Self Care | Payer: BLUE CROSS/BLUE SHIELD | Attending: Emergency Medicine | Admitting: Emergency Medicine

## 2018-06-27 DIAGNOSIS — N182 Chronic kidney disease, stage 2 (mild): Secondary | ICD-10-CM | POA: Insufficient documentation

## 2018-06-27 DIAGNOSIS — Z7982 Long term (current) use of aspirin: Secondary | ICD-10-CM | POA: Insufficient documentation

## 2018-06-27 DIAGNOSIS — E1122 Type 2 diabetes mellitus with diabetic chronic kidney disease: Secondary | ICD-10-CM | POA: Insufficient documentation

## 2018-06-27 DIAGNOSIS — Z79899 Other long term (current) drug therapy: Secondary | ICD-10-CM | POA: Insufficient documentation

## 2018-06-27 DIAGNOSIS — N200 Calculus of kidney: Secondary | ICD-10-CM

## 2018-06-27 DIAGNOSIS — I129 Hypertensive chronic kidney disease with stage 1 through stage 4 chronic kidney disease, or unspecified chronic kidney disease: Secondary | ICD-10-CM | POA: Insufficient documentation

## 2018-06-27 DIAGNOSIS — J45909 Unspecified asthma, uncomplicated: Secondary | ICD-10-CM | POA: Insufficient documentation

## 2018-06-27 DIAGNOSIS — Z794 Long term (current) use of insulin: Secondary | ICD-10-CM | POA: Diagnosis not present

## 2018-06-27 DIAGNOSIS — R1032 Left lower quadrant pain: Secondary | ICD-10-CM | POA: Diagnosis present

## 2018-06-27 LAB — CBC WITH DIFFERENTIAL/PLATELET
Abs Immature Granulocytes: 0.12 10*3/uL — ABNORMAL HIGH (ref 0.00–0.07)
BASOS ABS: 0 10*3/uL (ref 0.0–0.1)
Basophils Relative: 0 %
EOS ABS: 0.6 10*3/uL — AB (ref 0.0–0.5)
EOS PCT: 5 %
HEMATOCRIT: 37.3 % — AB (ref 39.0–52.0)
Hemoglobin: 11.9 g/dL — ABNORMAL LOW (ref 13.0–17.0)
Immature Granulocytes: 1 %
Lymphocytes Relative: 38 %
Lymphs Abs: 4 10*3/uL (ref 0.7–4.0)
MCH: 27.9 pg (ref 26.0–34.0)
MCHC: 31.9 g/dL (ref 30.0–36.0)
MCV: 87.4 fL (ref 80.0–100.0)
Monocytes Absolute: 0.5 10*3/uL (ref 0.1–1.0)
Monocytes Relative: 5 %
Neutro Abs: 5.5 10*3/uL (ref 1.7–7.7)
Neutrophils Relative %: 51 %
Platelets: 267 10*3/uL (ref 150–400)
RBC: 4.27 MIL/uL (ref 4.22–5.81)
RDW: 13.9 % (ref 11.5–15.5)
WBC: 10.7 10*3/uL — AB (ref 4.0–10.5)
nRBC: 0 % (ref 0.0–0.2)

## 2018-06-27 LAB — URINALYSIS, ROUTINE W REFLEX MICROSCOPIC
BACTERIA UA: NONE SEEN
BILIRUBIN URINE: NEGATIVE
Glucose, UA: NEGATIVE mg/dL
KETONES UR: NEGATIVE mg/dL
Leukocytes, UA: NEGATIVE
NITRITE: NEGATIVE
Protein, ur: 30 mg/dL — AB
SPECIFIC GRAVITY, URINE: 1.012 (ref 1.005–1.030)
pH: 5 (ref 5.0–8.0)

## 2018-06-27 LAB — COMPREHENSIVE METABOLIC PANEL
ALBUMIN: 3.3 g/dL — AB (ref 3.5–5.0)
ALT: 17 U/L (ref 0–44)
ANION GAP: 16 — AB (ref 5–15)
AST: 16 U/L (ref 15–41)
Alkaline Phosphatase: 55 U/L (ref 38–126)
BILIRUBIN TOTAL: 0.3 mg/dL (ref 0.3–1.2)
BUN: 30 mg/dL — ABNORMAL HIGH (ref 6–20)
CO2: 18 mmol/L — ABNORMAL LOW (ref 22–32)
Calcium: 8.7 mg/dL — ABNORMAL LOW (ref 8.9–10.3)
Chloride: 102 mmol/L (ref 98–111)
Creatinine, Ser: 1.12 mg/dL (ref 0.61–1.24)
GFR calc non Af Amer: >60 mL/min (ref >60)
GLUCOSE: 193 mg/dL — AB (ref 70–99)
POTASSIUM: 4 mmol/L (ref 3.5–5.1)
Sodium: 136 mmol/L (ref 135–145)
Total Protein: 7.5 g/dL (ref 6.5–8.1)

## 2018-06-27 LAB — LIPASE, BLOOD: Lipase: 59 U/L — ABNORMAL HIGH (ref 11–51)

## 2018-06-27 MED ORDER — TAMSULOSIN HCL 0.4 MG PO CAPS
0.4000 mg | ORAL_CAPSULE | Freq: Once | ORAL | Status: AC
  Administered 2018-06-27: 0.4 mg via ORAL
  Filled 2018-06-27: qty 1

## 2018-06-27 MED ORDER — OXYCODONE-ACETAMINOPHEN 5-325 MG PO TABS: 1.0000 | ORAL_TABLET | Freq: Four times a day (QID) | ORAL | 0 refills | Status: DC | PRN

## 2018-06-27 MED ORDER — ONDANSETRON HCL 4 MG/2ML IJ SOLN
4.0000 mg | Freq: Once | INTRAMUSCULAR | Status: AC
  Administered 2018-06-27: 4 mg via INTRAVENOUS
  Filled 2018-06-27: qty 2

## 2018-06-27 MED ORDER — HYDROMORPHONE HCL 1 MG/ML IJ SOLN
1.0000 mg | Freq: Once | INTRAMUSCULAR | Status: AC
  Administered 2018-06-27: 1 mg via INTRAVENOUS
  Filled 2018-06-27: qty 1

## 2018-06-27 MED ORDER — KETOROLAC TROMETHAMINE 15 MG/ML IJ SOLN: 15.0000 mg | Freq: Once | INTRAMUSCULAR | Status: DC

## 2018-06-27 MED ORDER — ONDANSETRON 4 MG PO TBDP: 4.0000 mg | ORAL_TABLET | Freq: Three times a day (TID) | ORAL | 0 refills | Status: DC | PRN

## 2018-06-27 MED ORDER — SODIUM CHLORIDE 0.9 % IV BOLUS
1000.0000 mL | Freq: Once | INTRAVENOUS | Status: AC
  Administered 2018-06-27: 1000 mL via INTRAVENOUS

## 2018-06-27 MED ORDER — HYDROCODONE-ACETAMINOPHEN 5-325 MG PO TABS
2.0000 | ORAL_TABLET | Freq: Once | ORAL | Status: AC
  Administered 2018-06-27: 2 via ORAL
  Filled 2018-06-27: qty 2

## 2018-06-27 MED ORDER — KETOROLAC TROMETHAMINE 15 MG/ML IJ SOLN
15.0000 mg | Freq: Once | INTRAMUSCULAR | Status: AC
  Administered 2018-06-27: 15 mg via INTRAVENOUS
  Filled 2018-06-27: qty 1

## 2018-06-27 MED ORDER — TAMSULOSIN HCL 0.4 MG PO CAPS: 0.4000 mg | ORAL_CAPSULE | Freq: Every day | ORAL | 0 refills | Status: AC

## 2018-06-27 MED ORDER — TAMSULOSIN HCL 0.4 MG PO CAPS: 0.4000 mg | ORAL_CAPSULE | Freq: Every day | ORAL | 0 refills | Status: DC

## 2018-06-27 NOTE — ED Triage Notes (Signed)
Pt states that around 1am he began having lower abd pain, worse on the left, with vomiting x 1 , denies diarrhea, denies fevers.

## 2018-06-27 NOTE — ED Notes (Signed)
Discharge instructions given to pt. Prescriptions given to pt. Pt verbalized understanding.

## 2018-06-27 NOTE — Discharge Instructions (Addendum)
Drink plenty of fluids.  Take the pain medications as prescribed.  Call to set up an appointment with urology.

## 2018-06-27 NOTE — ED Provider Notes (Signed)
[place] EMERGENCY DEPARTMENT Provider Note   CSN: [id number] Arrival date & time: 06/27/18  0408     History   Chief Complaint Chief Complaint  Patient presents with  . Abdominal Pain    HPI Troy [name] is a 52 y.o. male.  HPI 52 year old male with past medical history as below including diabetes, history of diabetic Charcot foot status post amputation, history of nephrolithiasis here with severe left flank pain.  Patient states he expressed acute onset of severe, aching, gnawing, left flank pain at around 1 AM.  The pain began acutely and was immediately severe.  Since then, he has had some radiation of the pain down towards his left testicle and groin.  Denies actual testicular pain.  He is had some urinary frequency but no overt hematuria.  He has a history of kidney stones but has never required instrumentation.  No specific alleviating factors.  Pain seems worse with palpation of the left abdomen and certain positions.  He has had nausea and vomiting today night as well.  Past Medical History:  Diagnosis Date  . Asthma   . Bronchitis   . CKD (chronic kidney disease)   . Diabetes mellitus   . Diabetic Charcot foot (Troy [name])    Left  . Gout   . Hypertension   . Kidney stones 11/30/2013  . Neuromuscular disorder (Troy [name])   . Obesity   . Pancreatitis   . Polyneuropathy in diabetes [facility])     Patient Active Problem List   Diagnosis Date Noted  . History of left below knee amputation (Troy [name]) 03/26/2018  . Dysphagia 09/01/2017  . Special screening for malignant neoplasms, colon 09/01/2017  . Esophageal reflux 09/01/2017  . Acute osteomyelitis of left foot ([place])   . Diabetic foot ([place]) 02/02/2016  . Osteomyelitis ([place]) 02/02/2016  . Osteomyelitis of foot, acute, left 02/02/2016  . CKD (chronic kidney disease), stage II 02/02/2016  . Anemia 02/02/2016  . Kidney stones 11/30/2013  . Cholelithiasis: PER CT ABD/PELVIS AND ABD [place] 11/30/13 11/30/2013  . Acute  renal failure (Troy [name]) 11/29/2013  . Acute pancreatitis 11/29/2013  . Asthma exacerbation 11/09/2013  . CAP (community acquired pneumonia) 07/18/2013  . Diabetes (Troy [name]) 07/18/2013  . Hypertension     Past Surgical History:  Procedure Laterality Date  . AMPUTATION Left 02/06/2016   Procedure: AMPUTATION BELOW KNEE;  Surgeon: Marybelle Killings, MD;  Location: [place];  Service: Orthopedics;  Laterality: Left;  . CHOLECYSTECTOMY N/A 12/03/2013   Procedure: LAPAROSCOPIC CHOLECYSTECTOMY WITH INTRAOPERATIVE CHOLANGIOGRAM;  Surgeon: Rolm Bookbinder, MD;  Location: [place];  Service: General;  Laterality: N/A;  . COLONOSCOPY WITH PROPOFOL N/A 09/01/2017   Procedure: COLONOSCOPY WITH PROPOFOL;  Surgeon: Wilford Corner, MD;  Location: [facility];  Service: Endoscopy;  Laterality: N/A;  . ESOPHAGOGASTRODUODENOSCOPY (EGD) WITH PROPOFOL N/A 09/01/2017   Procedure: ESOPHAGOGASTRODUODENOSCOPY (EGD) WITH PROPOFOL;  Surgeon: Wilford Corner, MD;  Location: [facility];  Service: Endoscopy;  Laterality: N/A;  . NO PAST SURGERIES          Home Medications    Prior to Admission medications   Medication Sig Start Date End Date Taking? Authorizing Provider  albuterol (PROVENTIL HFA;VENTOLIN HFA) 108 (90 Base) MCG/ACT inhaler Inhale 1-2 puffs into the lungs every 6 (six) hours as needed for wheezing or shortness of breath. 08/25/15  Yes Laisure, Nira Conn, PA-C  albuterol (PROVENTIL) (2.5 MG/3ML) 0.083% nebulizer solution Take 2.5 mg by nebulization every 4 (four) hours as needed for wheezing.  06/11/16  Yes  [provider]  aspirin (ASPIRIN EC) 81 MG EC tablet Take 81 mg by mouth daily. Swallow whole.   Yes [provider]  atorvastatin (LIPITOR) 10 MG tablet Take 10 mg by mouth daily. 03/06/17  Yes [provider]  BREO ELLIPTA 100-25 MCG/INH AEPB Inhale 1 puff into the lungs daily.  07/31/15  Yes [provider]  citalopram (CELEXA) 10 MG tablet Take 10 mg by mouth daily. 03/06/17   Yes [provider]  fluticasone (FLONASE) 50 MCG/ACT nasal spray Place 2 sprays into both nostrils daily. 78/93/81  Yes Delora Fuel, MD  glimepiride (AMARYL) 4 MG tablet Take 4 mg by mouth daily with breakfast.   Yes [provider]  LANTUS SOLOSTAR 100 UNIT/ML Solostar Pen Inject 10 Units into the skin at bedtime.  01/10/16  Yes [provider]  lisinopril-hydrochlorothiazide (PRINZIDE,ZESTORETIC) 10-12.5 MG tablet Take 1 tablet by mouth daily.  05/06/16  Yes [provider]  metFORMIN (GLUCOPHAGE) 1000 MG tablet Take 1,000 mg by mouth 2 (two) times daily. 03/16/17  Yes [provider]  Multiple Vitamin (MULTIVITAMIN WITH MINERALS) TABS tablet Take 1 tablet by mouth daily.   Yes [provider]  pantoprazole (PROTONIX) 40 MG tablet Take 40 mg by mouth daily.  06/11/16  Yes [provider]  chlorpheniramine-HYDROcodone (TUSSIONEX PENNKINETIC ER) 10-8 MG/5ML SUER Take 5 mLs by mouth every 12 (twelve) hours as needed for cough. Patient not taking: Reported on 06/27/2018 05/29/18   Charlann Lange, PA-C  CONTOUR NEXT TEST test strip 1 each daily as needed. 03/24/17   [provider]  cyclobenzaprine (FLEXERIL) 10 MG tablet Take 0.5-1 tablets (5-10 mg total) by mouth 2 (two) times daily as needed. Patient not taking: Reported on 06/27/2018 06/03/15   Delos Haring, PA-C  doxycycline (VIBRAMYCIN) 100 MG capsule Take 1 capsule (100 mg total) by mouth 2 (two) times daily. Patient not taking: Reported on 06/27/2018 05/29/18   Charlann Lange, PA-C  MICROLET LANCETS MISC 1 each daily as needed. 02/16/17   [provider]  mupirocin ointment (BACTROBAN) 2 % Apply 1 application topically 3 (three) times daily. Patient not taking: Reported on 06/27/2018 09/12/16   Robyn Haber, MD  ondansetron (ZOFRAN ODT) 4 MG disintegrating tablet Take 1 tablet (4 mg total) by mouth every 8 (eight) hours as needed for nausea or vomiting. 06/27/18    Duffy Bruce, MD  oxyCODONE-acetaminophen (PERCOCET/ROXICET) 5-325 MG tablet Take 1-2 tablets by mouth every 6 (six) hours as needed for moderate pain or severe pain. 06/27/18   Duffy Bruce, MD  tamsulosin (FLOMAX) 0.4 MG CAPS capsule Take 1 capsule (0.4 mg total) by mouth daily for 7 days. 06/27/18 07/04/18  Duffy Bruce, MD    Family History Family History  Problem Relation Age of Onset  . Diabetes Mellitus II Father   . Diabetes Father   . Hypertension Father   . Diabetes Mother   . Hypertension Mother   . CAD Neg Hx     Social History Social History   Tobacco Use  . Smoking status: Never Smoker  . Smokeless tobacco: Never Used  Substance Use Topics  . Alcohol use: Yes    Comment: occasionally  . Drug use: No     Allergies   No known allergies   Review of Systems Review of Systems  Constitutional: Negative for chills, fatigue and fever.  HENT: Negative for congestion and rhinorrhea.   Eyes: Negative for visual disturbance.  Respiratory: Negative for cough, shortness of breath and  wheezing.   Cardiovascular: Negative for chest pain and leg swelling.  Gastrointestinal: Positive for abdominal pain, nausea and vomiting. Negative for diarrhea.  Genitourinary: Positive for frequency and testicular pain. Negative for dysuria and flank pain.  Musculoskeletal: Negative for neck pain and neck stiffness.  Skin: Negative for rash and wound.  Allergic/Immunologic: Negative for immunocompromised state.  Neurological: Negative for syncope, weakness and headaches.  All other systems reviewed and are negative.    Physical Exam Updated Vital Signs BP (!) 168/94   Pulse 98   Temp 97.6 F (36.4 C) (Oral)   Resp 20   SpO2 100%   Physical Exam  Constitutional: He is oriented to person, place, and time. He appears well-developed and well-nourished. He appears distressed.  HENT:  Head: Normocephalic and atraumatic.  Eyes: Conjunctivae are normal.  Neck: Neck supple.    Cardiovascular: Normal rate, regular rhythm and normal heart sounds. Exam reveals no friction rub.  No murmur heard. Pulmonary/Chest: Effort normal and breath sounds normal. No respiratory distress. He has no wheezes. He has no rales.  Abdominal: He exhibits no distension.  Moderate, diffuse left-sided abdominal tenderness.  No overt CVA tenderness.  No rebound or guarding.  Obese.  Genitourinary:  Genitourinary Comments: Testes descended bilaterally.  Minimal tenderness with palpation of left testicle, but no significant erythema, swelling, or other abnormality.  Musculoskeletal: He exhibits no edema.  Neurological: He is alert and oriented to person, place, and time. He exhibits normal muscle tone.  Skin: Skin is warm. Capillary refill takes less than 2 seconds.  Psychiatric: He has a normal mood and affect.  Nursing note and vitals reviewed.    ED Treatments / Results  Labs (all labs ordered are listed, but only abnormal results are displayed) Labs Reviewed  CBC WITH DIFFERENTIAL/PLATELET - Abnormal; Notable for the following components:      Result Value   WBC 10.7 (*)    Hemoglobin 11.9 (*)    HCT 37.3 (*)    Eosinophils Absolute 0.6 (*)    Abs Immature Granulocytes 0.12 (*)    All other components within normal limits  COMPREHENSIVE METABOLIC PANEL - Abnormal; Notable for the following components:   CO2 18 (*)    Glucose, Bld 193 (*)    BUN 30 (*)    Calcium 8.7 (*)    Albumin 3.3 (*)    Anion gap 16 (*)    All other components within normal limits  LIPASE, BLOOD - Abnormal; Notable for the following components:   Lipase 59 (*)    All other components within normal limits  URINALYSIS, ROUTINE W REFLEX MICROSCOPIC - Abnormal; Notable for the following components:   APPearance CLOUDY (*)    Hgb urine dipstick LARGE (*)    Protein, ur 30 (*)    RBC / HPF >50 (*)    All other components within normal limits    EKG None  Radiology Ct Renal Stone Study  Result  Date: 06/27/2018 CLINICAL DATA:  Initial evaluation for acute flank pain, recurrent stone disease suspected. EXAM: CT ABDOMEN AND PELVIS WITHOUT CONTRAST TECHNIQUE: Multidetector CT imaging of the abdomen and pelvis was performed following the standard protocol without IV contrast. COMPARISON:  Prior CT from 11/30/2013. FINDINGS: Lower chest: Prominent extrapleural fat at the lung bases with associated atelectatic changes, left greater than right. Visualized lung bases are otherwise clear. Hepatobiliary: Limited noncontrast evaluation of the liver is unremarkable. Gallbladder surgically absent. No biliary dilatation. Pancreas: Pancreas within normal limits. Spleen: Spleen within  normal limits. Adrenals/Urinary Tract: Adrenal glands are normal. 5 mm obstructive stone at the left UVJ with secondary mild to moderate left hydroureteronephrosis. Associated left perinephric and periureteral fat stranding. No other radiopaque calculi seen within the dilated left ureter. Additional punctate 3 mm stone at the lower pole left kidney. On the right, there is a 12 mm nonobstructive stone at the lower pole. No other radiopaque calculi seen along the course of the right renal collecting system. No right-sided hydronephrosis or hydroureter. Bladder largely decompressed with no other acute abnormality. Stomach/Bowel: Stomach within normal limits. No evidence for bowel obstruction. Normal appendix. Mild colonic diverticulosis without evidence for acute diverticulitis. No acute inflammatory changes seen about the bowels. Vascular/Lymphatic: Intra-abdominal aorta of normal caliber. No adenopathy. Reproductive: Prostate normal. Other: No free air or fluid. Small fat containing bilateral inguinal hernias noted. Musculoskeletal: Intramuscular lipoma noted anterior to the right hip. Bones are somewhat diffusely sclerotic in appearance, suspected be related history of chronic kidney disease. No discrete lytic or blastic osseous lesions. No  acute osseous abnormality. IMPRESSION: 1. 5 mm obstructive stone at the left UVJ with secondary mild to moderate left hydroureteronephrosis. 2. Additional bilateral nonobstructive nephrolithiasis as above. 3. Mild colonic diverticulosis without evidence for acute diverticulitis. Electronically Signed   By: Jeannine Boga M.D.   On: 06/27/2018 06:03    Procedures Procedures (including critical care time)  Medications Ordered in ED Medications  HYDROmorphone (DILAUDID) injection 1 mg (1 mg Intravenous Given 06/27/18 0514)  ondansetron (ZOFRAN) injection 4 mg (4 mg Intravenous Given 06/27/18 0514)  sodium chloride 0.9 % bolus 1,000 mL (0 mLs Intravenous Stopped 06/27/18 0620)  HYDROmorphone (DILAUDID) injection 1 mg (1 mg Intravenous Given 06/27/18 0558)  ketorolac (TORADOL) 15 MG/ML injection 15 mg (15 mg Intravenous Given 06/27/18 0557)  sodium chloride 0.9 % bolus 1,000 mL (1,000 mLs Intravenous New Bag/Given 06/27/18 0654)  tamsulosin (FLOMAX) capsule 0.4 mg (0.4 mg Oral Given 06/27/18 0654)  HYDROcodone-acetaminophen (NORCO/VICODIN) 5-325 MG per tablet 2 tablet (2 tablets Oral Given 06/27/18 0746)     Initial Impression / Assessment and Plan / ED Course  I have reviewed the triage vital signs and the nursing notes.  Pertinent labs & imaging results that were available during my care of the patient were reviewed by me and considered in my medical decision making (see chart for details).     52 yo M with PMHx as above here with L flank pain. Suspect acute renal colic 2/2 obstructing stone. Imaging shows 5 mm stone at UVJ. Creatinine is at baseline. No fever or signs of superinfection clinically with no bacteria on UA. Pt feeling better with IVF, analgesics, tolerating PO. Will d/c with outpatient follow-up.  Final Clinical Impressions(s) / ED Diagnoses   Final diagnoses:  Nephrolithiasis    ED Discharge Orders         Ordered    oxyCODONE-acetaminophen (PERCOCET/ROXICET) 5-325 MG  tablet  Every 6 hours PRN,   Status:  Discontinued     06/27/18 0726    ondansetron (ZOFRAN ODT) 4 MG disintegrating tablet  Every 8 hours PRN,   Status:  Discontinued     06/27/18 0726    tamsulosin (FLOMAX) 0.4 MG CAPS capsule  Daily,   Status:  Discontinued     06/27/18 0726    ondansetron (ZOFRAN ODT) 4 MG disintegrating tablet  Every 8 hours PRN     06/27/18 0750    oxyCODONE-acetaminophen (PERCOCET/ROXICET) 5-325 MG tablet  Every 6 hours PRN  06/27/18 0750    tamsulosin (FLOMAX) 0.4 MG CAPS capsule  Daily     06/27/18 0750           Duffy Bruce, MD 06/27/18 [phone]

## 2018-07-09 ENCOUNTER — Other Ambulatory Visit: Payer: Self-pay | Admitting: Internal Medicine

## 2018-07-09 ENCOUNTER — Ambulatory Visit
Admission: RE | Admit: 2018-07-09 | Discharge: 2018-07-09 | Disposition: A | Discharge status: Home / Self Care | Payer: BLUE CROSS/BLUE SHIELD | Source: Ambulatory Visit | Attending: Internal Medicine | Admitting: Internal Medicine

## 2018-07-09 DIAGNOSIS — L97509 Non-pressure chronic ulcer of other part of unspecified foot with unspecified severity: Principal | ICD-10-CM

## 2018-07-09 DIAGNOSIS — E11621 Type 2 diabetes mellitus with foot ulcer: Secondary | ICD-10-CM

## 2018-07-15 ENCOUNTER — Ambulatory Visit (INDEPENDENT_AMBULATORY_CARE_PROVIDER_SITE_OTHER): Payer: BLUE CROSS/BLUE SHIELD

## 2018-07-15 ENCOUNTER — Other Ambulatory Visit: Payer: Self-pay | Admitting: Podiatry

## 2018-07-15 ENCOUNTER — Ambulatory Visit: Payer: BLUE CROSS/BLUE SHIELD | Admitting: Podiatry

## 2018-07-15 ENCOUNTER — Encounter: Payer: Self-pay | Admitting: Podiatry

## 2018-07-15 DIAGNOSIS — L97311 Non-pressure chronic ulcer of right ankle limited to breakdown of skin: Secondary | ICD-10-CM

## 2018-07-15 DIAGNOSIS — L97301 Non-pressure chronic ulcer of unspecified ankle limited to breakdown of skin: Secondary | ICD-10-CM

## 2018-07-15 DIAGNOSIS — M79671 Pain in right foot: Secondary | ICD-10-CM | POA: Diagnosis not present

## 2018-07-15 MED ORDER — DOXYCYCLINE HYCLATE 100 MG PO TABS: 100.0000 mg | ORAL_TABLET | Freq: Two times a day (BID) | ORAL | 0 refills | Status: DC

## 2018-07-17 NOTE — Progress Notes (Signed)
Subjective:   Patient ID: Troy [name], male   DOB: 52 y.o.   MRN: [id number]   HPI Patient presents with ulceration of the right first metatarsal with a long history of problems and BKA amputation left leg 2017 with patient being a diabetic who is not in good control and also has kidney disease.  States he is noticed this over the last couple months and that it has not really changed but it is not improving   Review of Systems  All other systems reviewed and are negative.       Objective:  Physical Exam Vitals signs and nursing note reviewed.  Constitutional:      Appearance: He is well-developed.  Pulmonary:     Effort: Pulmonary effort is normal.  Musculoskeletal: Normal range of motion.  Skin:    General: Skin is warm.  Neurological:     Mental Status: He is alert.     Vascular status appears to be intact with diminishment sharp dull vibratory DTR reflexes.  Patient is found to have breakdown of tissue right first metatarsal measuring about 1 x 1 cm with subcutaneous exposure with no proximal edema erythema noted currently.  He is just finished an antibiotic and does have BK left amputation after having had an infection in his left foot that was not resolved     Assessment:  High risk patient with ulceration right first metatarsal with history of amputation left lower leg     Plan:  H&P education reviewed and at this point I did clean the area flushed and some debridement of tissue and applied meta honey which the patient is skinny use along with surgical shoe to keep pressure off the first metatarsal.  I gave strict instructions of any proximal edema erythema drainage or other pathology were to occur to go straight to the hospital and I did place back on doxycycline 100 mg twice daily and again to have him see Dr. March Rummage next week and he understands surgery may be necessary for this and that ultimately amputation may be possible for his condition.  The area of ulceration  measures approximately 1.3 cm x 1.3 cm with 2 mm of depth  X-ray was currently negative for signs of ostial lysis condition or indications of osteomyelitis

## 2018-07-22 ENCOUNTER — Ambulatory Visit (INDEPENDENT_AMBULATORY_CARE_PROVIDER_SITE_OTHER): Payer: BLUE CROSS/BLUE SHIELD | Admitting: Podiatry

## 2018-07-22 DIAGNOSIS — E1151 Type 2 diabetes mellitus with diabetic peripheral angiopathy without gangrene: Secondary | ICD-10-CM | POA: Diagnosis not present

## 2018-07-22 DIAGNOSIS — E08621 Diabetes mellitus due to underlying condition with foot ulcer: Secondary | ICD-10-CM

## 2018-07-22 DIAGNOSIS — I739 Peripheral vascular disease, unspecified: Secondary | ICD-10-CM

## 2018-07-22 DIAGNOSIS — M2011 Hallux valgus (acquired), right foot: Secondary | ICD-10-CM | POA: Diagnosis not present

## 2018-07-22 DIAGNOSIS — M8580 Other specified disorders of bone density and structure, unspecified site: Secondary | ICD-10-CM

## 2018-07-22 DIAGNOSIS — I998 Other disorder of circulatory system: Secondary | ICD-10-CM | POA: Diagnosis not present

## 2018-07-22 DIAGNOSIS — L97411 Non-pressure chronic ulcer of right heel and midfoot limited to breakdown of skin: Secondary | ICD-10-CM | POA: Diagnosis not present

## 2018-07-22 NOTE — Progress Notes (Signed)
Subjective:  Patient ID: Troy [name], male    DOB: 11-13-1965,  MRN: [id number]  Chief Complaint  Patient presents with  . Foot Ulcer    right first metatarsal    52 y.o. male presents for wound care. Reports right foot wound present for about 2 months.  Previously seen Dr. Paulla Dolly for this issue was doing meta honey and applying dressing daily. Referred for surgical evaluation.  Denies warmth or redness at the ulcer site denies pain.  History of left BKA  Review of Systems: Negative except as noted in the HPI. Denies N/V/F/Ch.  Past Medical History:  Diagnosis Date  . Asthma   . Bronchitis   . CKD (chronic kidney disease)   . Diabetes mellitus   . Diabetic Charcot foot ([place])    Left  . Gout   . Hypertension   . Kidney stones 11/30/2013  . Neuromuscular disorder ([place])   . Obesity   . Pancreatitis   . Polyneuropathy in diabetes [facility])     Current Outpatient Medications:  .  albuterol (PROVENTIL HFA;VENTOLIN HFA) 108 (90 Base) MCG/ACT inhaler, Inhale 1-2 puffs into the lungs every 6 (six) hours as needed for wheezing or shortness of breath., Disp: 1 Inhaler, Rfl: 0 .  albuterol (PROVENTIL) (2.5 MG/3ML) 0.083% nebulizer solution, Take 2.5 mg by nebulization every 4 (four) hours as needed for wheezing. , Disp: , Rfl: 5 .  aspirin (ASPIRIN EC) 81 MG EC tablet, Take 81 mg by mouth daily. Swallow whole., Disp: , Rfl:  .  atorvastatin (LIPITOR) 10 MG tablet, Take 10 mg by mouth daily., Disp: , Rfl: 10 .  BREO ELLIPTA 100-25 MCG/INH AEPB, Inhale 1 puff into the lungs daily. , Disp: , Rfl: 5 .  chlorpheniramine-HYDROcodone (TUSSIONEX PENNKINETIC ER) 10-8 MG/5ML SUER, Take 5 mLs by mouth every 12 (twelve) hours as needed for cough., Disp: 75 mL, Rfl: 0 .  citalopram (CELEXA) 10 MG tablet, Take 10 mg by mouth daily., Disp: , Rfl: 5 .  CONTOUR NEXT TEST test strip, 1 each daily as needed., Disp: , Rfl: 5 .  cyclobenzaprine (FLEXERIL) 10 MG tablet, Take 0.5-1 tablets (5-10 mg total) by  mouth 2 (two) times daily as needed., Disp: 20 tablet, Rfl: 0 .  doxycycline (VIBRA-TABS) 100 MG tablet, Take 1 tablet (100 mg total) by mouth 2 (two) times daily., Disp: 20 tablet, Rfl: 0 .  doxycycline (VIBRAMYCIN) 100 MG capsule, Take 1 capsule (100 mg total) by mouth 2 (two) times daily., Disp: 20 capsule, Rfl: 0 .  fluticasone (FLONASE) 50 MCG/ACT nasal spray, Place 2 sprays into both nostrils daily., Disp: 16 g, Rfl: 0 .  glimepiride (AMARYL) 4 MG tablet, Take 4 mg by mouth daily with breakfast., Disp: , Rfl:  .  LANTUS SOLOSTAR 100 UNIT/ML Solostar Pen, Inject 10 Units into the skin at bedtime. , Disp: , Rfl: 11 .  lisinopril-hydrochlorothiazide (PRINZIDE,ZESTORETIC) 10-12.5 MG tablet, Take 1 tablet by mouth daily. , Disp: , Rfl: 2 .  metFORMIN (GLUCOPHAGE) 1000 MG tablet, Take 1,000 mg by mouth 2 (two) times daily., Disp: , Rfl: 10 .  MICROLET LANCETS MISC, 1 each daily as needed., Disp: , Rfl: 5 .  Multiple Vitamin (MULTIVITAMIN WITH MINERALS) TABS tablet, Take 1 tablet by mouth daily., Disp: , Rfl:  .  mupirocin ointment (BACTROBAN) 2 %, Apply 1 application topically 3 (three) times daily., Disp: 30 g, Rfl: 1 .  ondansetron (ZOFRAN ODT) 4 MG disintegrating tablet, Take 1 tablet (4 mg total) by mouth  every 8 (eight) hours as needed for nausea or vomiting., Disp: 15 tablet, Rfl: 0 .  oxyCODONE-acetaminophen (PERCOCET/ROXICET) 5-325 MG tablet, Take 1-2 tablets by mouth every 6 (six) hours as needed for moderate pain or severe pain., Disp: 20 tablet, Rfl: 0 .  pantoprazole (PROTONIX) 40 MG tablet, Take 40 mg by mouth daily. , Disp: , Rfl: 0  Social History   Tobacco Use  Smoking Status Never Smoker  Smokeless Tobacco Never Used    Allergies  Allergen Reactions  . No Known Allergies    Objective:  There were no vitals filed for this visit. There is no height or weight on file to calculate BMI. Constitutional Well developed. Well nourished.  Vascular Dorsalis pedis pulses barely  palpable right. Posterior tibial pulses non-palpable right. Capillary refill normal to all digits.  No cyanosis or clubbing noted. Pedal hair growth normal.  Neurologic Normal speech. Oriented to person, place, and time. Protective sensation absent  Dermatologic Wound Location: R 1st MPJ dorsalmedial Wound Base: Mixed Granular/Fibrotic Peri-wound: Calloused Exudate: Scant/small amount Serosanguinous exudate Wound Measurements: -1.5x1.5 No probe to bone no warmth or erythema   Orthopedic: No pain to palpation either foot. HAV deformity right   Radiographs: Prior x-rays reviewed possible erosion of the first metatarsal.  Vascular calcifications noted Assessment:   1. Diabetic ulcer of right midfoot associated with diabetes mellitus due to underlying condition, limited to breakdown of skin ([place])   2. Diabetes mellitus type 2 with peripheral artery disease ([place])   3. PAD (peripheral artery disease) ([place])   4. Vascular calcification   5. Hallux abducto valgus, right   6. Bone erosion determined by x-ray    Plan:  Patient was evaluated and treated and all questions answered.  Ulcer right first MPJ -Debridement as below. -Dressed with medihoney, offloading pad, DSD. -Continue off-loading with surgical shoe.  Procedure: Selective Debridement of Wound Rationale: Removal of devitalized tissue from the wound to promote healing.  Pre-Debridement Wound Measurements: 1.5 cm x 1.5 cm x 0.1 cm  Post-Debridement Wound Measurements: same as pre-debridement. Type of Debridement: sharp selective Tissue Removed: Devitalized soft-tissue Dressing: Dry, sterile, compression dressing. Disposition: Patient tolerated procedure well. Patient to return in 1 week for follow-up.  Hallux valgus, PAD, concern for bone erosion -We will order noninvasive vascular studies to further evaluate prior surgical intervention as patient has vascular calcifications noted on x-rays and perfusion does appear  slightly decreased.  Concern for delayed healing should we proceed with surgery intervention however he will likely need surgical intervention at some point.  As is not acutely infected will proceed with vascular work-up and if vascular status intact will proceed with surgical intervention, likely first metatarsal ostectomy, bone biopsy.  Return in about 1 week (around 07/29/2018) for Wound Care.

## 2018-07-23 ENCOUNTER — Telehealth: Payer: Self-pay | Admitting: *Deleted

## 2018-07-23 DIAGNOSIS — L97411 Non-pressure chronic ulcer of right heel and midfoot limited to breakdown of skin: Secondary | ICD-10-CM

## 2018-07-23 DIAGNOSIS — E08621 Diabetes mellitus due to underlying condition with foot ulcer: Secondary | ICD-10-CM

## 2018-07-23 DIAGNOSIS — I998 Other disorder of circulatory system: Secondary | ICD-10-CM

## 2018-07-23 DIAGNOSIS — E1151 Type 2 diabetes mellitus with diabetic peripheral angiopathy without gangrene: Secondary | ICD-10-CM

## 2018-07-23 DIAGNOSIS — I739 Peripheral vascular disease, unspecified: Secondary | ICD-10-CM

## 2018-07-23 NOTE — Telephone Encounter (Signed)
-----   Message from Evelina Bucy, DPM sent at 07/22/2018  3:31 PM EST ----- Can we order STAT ABIs. He sees me next week so they need to be done before then

## 2018-07-23 NOTE — Telephone Encounter (Signed)
Faxed orders to [facility].

## 2018-07-26 ENCOUNTER — Ambulatory Visit (HOSPITAL_COMMUNITY)
Admission: RE | Admit: 2018-07-26 | Discharge: 2018-07-26 | Disposition: A | Discharge status: Home / Self Care | Payer: BLUE CROSS/BLUE SHIELD | Source: Ambulatory Visit | Attending: Cardiology | Admitting: Cardiology

## 2018-07-26 DIAGNOSIS — I739 Peripheral vascular disease, unspecified: Secondary | ICD-10-CM

## 2018-07-26 DIAGNOSIS — I998 Other disorder of circulatory system: Secondary | ICD-10-CM | POA: Diagnosis present

## 2018-07-26 DIAGNOSIS — E08621 Diabetes mellitus due to underlying condition with foot ulcer: Secondary | ICD-10-CM

## 2018-07-26 DIAGNOSIS — E1151 Type 2 diabetes mellitus with diabetic peripheral angiopathy without gangrene: Secondary | ICD-10-CM | POA: Diagnosis present

## 2018-07-26 DIAGNOSIS — L97411 Non-pressure chronic ulcer of right heel and midfoot limited to breakdown of skin: Secondary | ICD-10-CM | POA: Diagnosis present

## 2018-07-29 ENCOUNTER — Ambulatory Visit: Payer: BLUE CROSS/BLUE SHIELD | Admitting: Podiatry

## 2018-07-29 ENCOUNTER — Ambulatory Visit: Payer: Medicare Other | Admitting: Podiatry

## 2018-07-29 ENCOUNTER — Encounter: Payer: Self-pay | Admitting: Podiatry

## 2018-07-29 DIAGNOSIS — E08621 Diabetes mellitus due to underlying condition with foot ulcer: Secondary | ICD-10-CM | POA: Diagnosis not present

## 2018-07-29 DIAGNOSIS — M2011 Hallux valgus (acquired), right foot: Secondary | ICD-10-CM

## 2018-07-29 DIAGNOSIS — Z79899 Other long term (current) drug therapy: Secondary | ICD-10-CM | POA: Diagnosis not present

## 2018-07-29 DIAGNOSIS — I739 Peripheral vascular disease, unspecified: Secondary | ICD-10-CM | POA: Diagnosis not present

## 2018-07-29 DIAGNOSIS — L97411 Non-pressure chronic ulcer of right heel and midfoot limited to breakdown of skin: Secondary | ICD-10-CM

## 2018-07-29 DIAGNOSIS — E1151 Type 2 diabetes mellitus with diabetic peripheral angiopathy without gangrene: Secondary | ICD-10-CM | POA: Diagnosis not present

## 2018-07-29 NOTE — Progress Notes (Signed)
Subjective:  Patient ID: Troy [name], male    DOB: 1966/06/01,  MRN: [id number]  Chief Complaint  Patient presents with  . Wound Check    Follow-up; Right foot; Dorsal-submet 1; pt stated, "My foot feels better; I am not in any discomfort"; pt Diabetic Type 2; Sugar=did not check today; A1C=pt stated, "8.0" - not in chart    53 y.o. male presents for wound care. Had vascular studies done here for review.  Review of Systems: Negative except as noted in the HPI. Denies N/V/F/Ch.  Past Medical History:  Diagnosis Date  . Asthma   . Bronchitis   . CKD (chronic kidney disease)   . Diabetes mellitus   . Diabetic Charcot foot ([place])    Left  . Gout   . Hypertension   . Kidney stones 11/30/2013  . Neuromuscular disorder ([place])   . Obesity   . Pancreatitis   . Polyneuropathy in diabetes [facility])     Current Outpatient Medications:  .  albuterol (PROVENTIL HFA;VENTOLIN HFA) 108 (90 Base) MCG/ACT inhaler, Inhale 1-2 puffs into the lungs every 6 (six) hours as needed for wheezing or shortness of breath., Disp: 1 Inhaler, Rfl: 0 .  albuterol (PROVENTIL) (2.5 MG/3ML) 0.083% nebulizer solution, Take 2.5 mg by nebulization every 4 (four) hours as needed for wheezing. , Disp: , Rfl: 5 .  aspirin (ASPIRIN EC) 81 MG EC tablet, Take 81 mg by mouth daily. Swallow whole., Disp: , Rfl:  .  atorvastatin (LIPITOR) 10 MG tablet, Take 10 mg by mouth daily., Disp: , Rfl: 10 .  BREO ELLIPTA 100-25 MCG/INH AEPB, Inhale 1 puff into the lungs daily. , Disp: , Rfl: 5 .  chlorpheniramine-HYDROcodone (TUSSIONEX PENNKINETIC ER) 10-8 MG/5ML SUER, Take 5 mLs by mouth every 12 (twelve) hours as needed for cough., Disp: 75 mL, Rfl: 0 .  citalopram (CELEXA) 10 MG tablet, Take 10 mg by mouth daily., Disp: , Rfl: 5 .  CONTOUR NEXT TEST test strip, 1 each daily as needed., Disp: , Rfl: 5 .  cyclobenzaprine (FLEXERIL) 10 MG tablet, Take 0.5-1 tablets (5-10 mg total) by mouth 2 (two) times daily as needed., Disp: 20  tablet, Rfl: 0 .  doxycycline (VIBRA-TABS) 100 MG tablet, Take 1 tablet (100 mg total) by mouth 2 (two) times daily., Disp: 20 tablet, Rfl: 0 .  fluticasone (FLONASE) 50 MCG/ACT nasal spray, Place 2 sprays into both nostrils daily., Disp: 16 g, Rfl: 0 .  glimepiride (AMARYL) 4 MG tablet, Take 4 mg by mouth daily with breakfast., Disp: , Rfl:  .  LANTUS SOLOSTAR 100 UNIT/ML Solostar Pen, Inject 10 Units into the skin at bedtime. , Disp: , Rfl: 11 .  lisinopril-hydrochlorothiazide (PRINZIDE,ZESTORETIC) 10-12.5 MG tablet, Take 1 tablet by mouth daily. , Disp: , Rfl: 2 .  metFORMIN (GLUCOPHAGE) 1000 MG tablet, Take 1,000 mg by mouth 2 (two) times daily., Disp: , Rfl: 10 .  MICROLET LANCETS MISC, 1 each daily as needed., Disp: , Rfl: 5 .  Multiple Vitamin (MULTIVITAMIN WITH MINERALS) TABS tablet, Take 1 tablet by mouth daily., Disp: , Rfl:  .  mupirocin ointment (BACTROBAN) 2 %, Apply 1 application topically 3 (three) times daily., Disp: 30 g, Rfl: 1 .  ondansetron (ZOFRAN ODT) 4 MG disintegrating tablet, Take 1 tablet (4 mg total) by mouth every 8 (eight) hours as needed for nausea or vomiting., Disp: 15 tablet, Rfl: 0 .  pantoprazole (PROTONIX) 40 MG tablet, Take 40 mg by mouth daily. , Disp: , Rfl: 0  Social History   Tobacco Use  Smoking Status Never Smoker  Smokeless Tobacco Never Used    Allergies  Allergen Reactions  . No Known Allergies    Objective:  There were no vitals filed for this visit. There is no height or weight on file to calculate BMI. Constitutional Well developed. Well nourished.  Vascular Dorsalis pedis pulses barely palpable right. Posterior tibial pulses non-palpable right. Capillary refill normal to all digits.  No cyanosis or clubbing noted. Pedal hair growth normal.  Neurologic Normal speech. Oriented to person, place, and time. Protective sensation absent  Dermatologic Wound Location: R 1st MPJ dorsalmedial Wound Base: Mixed Granular/Fibrotic Peri-wound:  Calloused Exudate: Scant/small amount Serosanguinous exudate Wound Measurements: -1.5x1.5 No probe to bone no warmth or erythema  Orthopedic: No pain to palpation either foot. HAV deformity right   Radiographs: Prior x-rays reviewed possible erosion of the first metatarsal.  Vascular calcifications noted Assessment:   No diagnosis found. Plan:  Patient was evaluated and treated and all questions answered.  Ulcer right first MPJ -No debridement today. -Dressed with medihoney, offloading pad, DSD. -Continue off-loading with surgical shoe.   Hallux valgus, PAD, concern for bone erosion -Vascular studies reviewed. TBI appears near normal but with calcification of the leg arteries. Will refer for vascular eval to see if he would benefit from attempt at reperfusion prior to surgery. Wound appears stable and without signs of infection, no urgent need for surgical intervention at this time.   No follow-ups on file.

## 2018-08-12 ENCOUNTER — Ambulatory Visit: Payer: Medicare Other | Admitting: Podiatry

## 2018-08-12 DIAGNOSIS — I998 Other disorder of circulatory system: Secondary | ICD-10-CM

## 2018-08-12 DIAGNOSIS — M8580 Other specified disorders of bone density and structure, unspecified site: Secondary | ICD-10-CM | POA: Diagnosis not present

## 2018-08-12 DIAGNOSIS — I739 Peripheral vascular disease, unspecified: Secondary | ICD-10-CM | POA: Diagnosis not present

## 2018-08-12 DIAGNOSIS — L97411 Non-pressure chronic ulcer of right heel and midfoot limited to breakdown of skin: Secondary | ICD-10-CM

## 2018-08-12 DIAGNOSIS — E08621 Diabetes mellitus due to underlying condition with foot ulcer: Secondary | ICD-10-CM | POA: Diagnosis not present

## 2018-08-12 NOTE — Patient Instructions (Signed)
Pre-Operative Instructions  Congratulations, you have decided to take an important step towards improving your quality of life.  You can be assured that the doctors and staff at [facility] will be with you every step of the way.  Here are some important things you should know:  1. Plan to be at the surgery center/hospital at least 1 (one) hour prior to your scheduled time, unless otherwise directed by the surgical center/hospital staff.  You must have a responsible adult accompany you, remain during the surgery and drive you home.  Make sure you have directions to the surgical center/hospital to ensure you arrive on time. 2. If you are having surgery at [facility] or [facility] hospitals, you will need a copy of your medical history and physical form from your family physician within one month prior to the date of surgery. We will give you a form for your primary physician to complete.  3. We make every effort to accommodate the date you request for surgery.  However, there are times where surgery dates or times have to be moved.  We will contact you as soon as possible if a change in schedule is required.   4. No aspirin/ibuprofen for one week before surgery.  If you are on aspirin, any non-steroidal anti-inflammatory medications (Mobic, Aleve, Ibuprofen) should not be taken seven (7) days prior to your surgery.  You make take Tylenol for pain prior to surgery.  5. Medications - If you are taking daily heart and blood pressure medications, seizure, reflux, allergy, asthma, anxiety, pain or diabetes medications, make sure you notify the surgery center/hospital before the day of surgery so they can tell you which medications you should take or avoid the day of surgery. 6. No food or drink after midnight the night before surgery unless directed otherwise by surgical center/hospital staff. 7. No alcoholic beverages 24-hours prior to surgery.  No smoking 24-hours prior or 24-hours after  surgery. 8. Wear loose pants or shorts. They should be loose enough to fit over bandages, boots, and casts. 9. Don't wear slip-on shoes. Sneakers are preferred. 10. Bring your boot with you to the surgery center/hospital.  Also bring crutches or a walker if your physician has prescribed it for you.  If you do not have this equipment, it will be provided for you after surgery. 11. If you have not been contacted by the surgery center/hospital by the day before your surgery, call to confirm the date and time of your surgery. 12. Leave-time from work may vary depending on the type of surgery you have.  Appropriate arrangements should be made prior to surgery with your employer. 13. Prescriptions will be provided immediately following surgery by your doctor.  Fill these as soon as possible after surgery and take the medication as directed. Pain medications will not be refilled on weekends and must be approved by the doctor. 14. Remove nail polish on the operative foot and avoid getting pedicures prior to surgery. 15. Wash the night before surgery.  The night before surgery wash the foot and leg well with water and the antibacterial soap provided. Be sure to pay special attention to beneath the toenails and in between the toes.  Wash for at least three (3) minutes. Rinse thoroughly with water and dry well with a towel.  Perform this wash unless told not to do so by your physician.  Enclosed: 1 Ice pack (please put in freezer the night before surgery)   1 Hibiclens skin cleaner     Pre-op instructions  If you have any questions regarding the instructions, please do not hesitate to call our office.  [place]: [address], [place], [place] [address] -- [phone]  [place]: [address] [place], [place] [address] -- [phone]  [place]: [address]  [place], [place] [address] -- [phone]  [place]: [address], [address], [place], [place] [address] -- [phone]  Website:  [email or web address] 

## 2018-08-15 NOTE — Progress Notes (Signed)
Subjective:  Patient ID: Troy [name], male    DOB: 01-Jun-1966,  MRN: [id number]  Chief Complaint  Patient presents with  . Diabetic Ulcer    2 week follow up right foot ulcer   53 y.o. male presents for wound care. States the ulcer is doing about the same.  Review of Systems: Negative except as noted in the HPI. Denies N/V/F/Ch.  Past Medical History:  Diagnosis Date  . Asthma   . Bronchitis   . CKD (chronic kidney disease)   . Diabetes mellitus   . Diabetic Charcot foot ([place])    Left  . Gout   . Hypertension   . Kidney stones 11/30/2013  . Neuromuscular disorder ([place])   . Obesity   . Pancreatitis   . Polyneuropathy in diabetes [facility])     Current Outpatient Medications:  .  albuterol (PROVENTIL HFA;VENTOLIN HFA) 108 (90 Base) MCG/ACT inhaler, Inhale 1-2 puffs into the lungs every 6 (six) hours as needed for wheezing or shortness of breath., Disp: 1 Inhaler, Rfl: 0 .  albuterol (PROVENTIL) (2.5 MG/3ML) 0.083% nebulizer solution, Take 2.5 mg by nebulization every 4 (four) hours as needed for wheezing. , Disp: , Rfl: 5 .  aspirin (ASPIRIN EC) 81 MG EC tablet, Take 81 mg by mouth daily. Swallow whole., Disp: , Rfl:  .  atorvastatin (LIPITOR) 10 MG tablet, Take 10 mg by mouth daily., Disp: , Rfl: 10 .  BREO ELLIPTA 100-25 MCG/INH AEPB, Inhale 1 puff into the lungs daily. , Disp: , Rfl: 5 .  chlorpheniramine-HYDROcodone (TUSSIONEX PENNKINETIC ER) 10-8 MG/5ML SUER, Take 5 mLs by mouth every 12 (twelve) hours as needed for cough., Disp: 75 mL, Rfl: 0 .  citalopram (CELEXA) 10 MG tablet, Take 10 mg by mouth daily., Disp: , Rfl: 5 .  CONTOUR NEXT TEST test strip, 1 each daily as needed., Disp: , Rfl: 5 .  cyclobenzaprine (FLEXERIL) 10 MG tablet, Take 0.5-1 tablets (5-10 mg total) by mouth 2 (two) times daily as needed., Disp: 20 tablet, Rfl: 0 .  doxycycline (VIBRA-TABS) 100 MG tablet, Take 1 tablet (100 mg total) by mouth 2 (two) times daily., Disp: 20 tablet, Rfl: 0 .  fluticasone  (FLONASE) 50 MCG/ACT nasal spray, Place 2 sprays into both nostrils daily., Disp: 16 g, Rfl: 0 .  glimepiride (AMARYL) 4 MG tablet, Take 4 mg by mouth daily with breakfast., Disp: , Rfl:  .  LANTUS SOLOSTAR 100 UNIT/ML Solostar Pen, Inject 10 Units into the skin at bedtime. , Disp: , Rfl: 11 .  lisinopril-hydrochlorothiazide (PRINZIDE,ZESTORETIC) 10-12.5 MG tablet, Take 1 tablet by mouth daily. , Disp: , Rfl: 2 .  metFORMIN (GLUCOPHAGE) 1000 MG tablet, Take 1,000 mg by mouth 2 (two) times daily., Disp: , Rfl: 10 .  MICROLET LANCETS MISC, 1 each daily as needed., Disp: , Rfl: 5 .  Multiple Vitamin (MULTIVITAMIN WITH MINERALS) TABS tablet, Take 1 tablet by mouth daily., Disp: , Rfl:  .  mupirocin ointment (BACTROBAN) 2 %, Apply 1 application topically 3 (three) times daily., Disp: 30 g, Rfl: 1 .  ondansetron (ZOFRAN ODT) 4 MG disintegrating tablet, Take 1 tablet (4 mg total) by mouth every 8 (eight) hours as needed for nausea or vomiting., Disp: 15 tablet, Rfl: 0 .  pantoprazole (PROTONIX) 40 MG tablet, Take 40 mg by mouth daily. , Disp: , Rfl: 0  Social History   Tobacco Use  Smoking Status Never Smoker  Smokeless Tobacco Never Used    Allergies  Allergen Reactions  .  No Known Allergies    Objective:  There were no vitals filed for this visit. There is no height or weight on file to calculate BMI. Constitutional Well developed. Well nourished.  Vascular Dorsalis pedis pulses barely palpable right. Posterior tibial pulses non-palpable right. Capillary refill normal to all digits.  No cyanosis or clubbing noted. Pedal hair growth normal.  Neurologic Normal speech. Oriented to person, place, and time. Protective sensation absent  Dermatologic Wound Location: R 1st MPJ dorsalmedial Wound Base: Mixed Granular/Fibrotic Peri-wound: Calloused Exudate: Scant/small amount Serosanguinous exudate Wound Measurements: -1.5x1.5 No probe to bone no warmth or erythema  Orthopedic: No pain to  palpation either foot. HAV deformity right   Radiographs:  Prior x-rays possible erosion of the first metatarsal.  Vascular calcifications noted Assessment:   1. Diabetic ulcer of right midfoot associated with diabetes mellitus due to underlying condition, limited to breakdown of skin ([place])   2. PAD (peripheral artery disease) ([facility])   3. Vascular calcification   4. Bone erosion determined by x-ray    Plan:  Patient was evaluated and treated and all questions answered.  Ulcer right first MPJ -No debridement today. -Dressed with medihoney, offloading pad, DSD. -Continue off-loading with surgical shoe.  Hallux valgus, PAD, concern for bone erosion -Vascular studies reviewed at length with patient. Again discussed that though the TBI appears near normal the calcification of the leg arteries does show signs. He does have a faintly palpable DP pulse. -Will refer to Dr. Rolley Sims for eval. Patient may benefit from angio prior to surgical intervention. While surgery is of higher risk given concern for PAD and contralateral BKA I do not think his wound will ever heal without surgery due to the bony prominence contributing to ulceration.  Surveillance XR at next visit . No follow-ups on file.

## 2018-08-16 ENCOUNTER — Telehealth: Payer: Self-pay | Admitting: *Deleted

## 2018-08-16 DIAGNOSIS — I998 Other disorder of circulatory system: Secondary | ICD-10-CM

## 2018-08-16 DIAGNOSIS — I739 Peripheral vascular disease, unspecified: Secondary | ICD-10-CM

## 2018-08-16 DIAGNOSIS — E08621 Diabetes mellitus due to underlying condition with foot ulcer: Secondary | ICD-10-CM

## 2018-08-16 DIAGNOSIS — L97411 Non-pressure chronic ulcer of right heel and midfoot limited to breakdown of skin: Secondary | ICD-10-CM

## 2018-08-16 DIAGNOSIS — M8580 Other specified disorders of bone density and structure, unspecified site: Secondary | ICD-10-CM

## 2018-08-16 NOTE — Telephone Encounter (Signed)
-----   Message from Evelina Bucy, DPM sent at 08/15/2018  4:17 PM EST ----- Can we refer to Dr. Rolley Sims for Eval? Not urgent but I would like him to be seen in the next couple weeks if possible

## 2018-08-16 NOTE — Telephone Encounter (Signed)
Referral faxed to [facility] - Dr. Berry. 

## 2018-08-17 ENCOUNTER — Telehealth: Payer: Self-pay | Admitting: Podiatry

## 2018-08-17 NOTE — Telephone Encounter (Signed)
Cathy from [facility] surgical center called requesting medical records on patient in order to prepare for procedure. She is needing the notes from the patients last visit as well as any information we have on drainage of the ulcer.  Fax# [phone]   Attn: Tye [place]

## 2018-08-17 NOTE — Telephone Encounter (Signed)
Troy [name], I don't have anything on this patient.

## 2018-08-18 ENCOUNTER — Encounter: Payer: Self-pay | Admitting: *Deleted

## 2018-08-18 NOTE — Progress Notes (Signed)
Per Dr. March Rummage,

## 2018-08-26 ENCOUNTER — Telehealth: Payer: Self-pay | Admitting: *Deleted

## 2018-08-26 MED ORDER — MEDIHONEY WOUND/BURN DRESSING EX GEL: CUTANEOUS | 5 refills | Status: DC

## 2018-08-26 NOTE — Telephone Encounter (Signed)
I am calling you in regards to your surgery.  We received papers from your primary care doctor for your surgery, however, the date is not acceptable.  You last saw him in December, that date does not fall within the 30 day window that's allowed for [facility].  You physical has to be done within 30 days of your surgery date.  "I don't have a date scheduled yet."  I know.  I am just letting you know the process to schedule your surgery, for example, if you had scheduled surgery for January 11,  the window for your physical would have been between the dates of July 08, 2018 to August 07, 2018.  "Oh, I see what you are saying.  Dr. March Rummage wants me to see a Cardiologist first, that's scheduled for September 07, 2018.  Once I see him, I'll call Dr. Delfina Redwood and schedule an appointment."  Okay, keep me informed.  "I will."

## 2018-08-26 NOTE — Telephone Encounter (Signed)
Pt requested refill of the Medihoney to be [facility] to [facility] [address].

## 2018-08-31 ENCOUNTER — Telehealth: Payer: Self-pay | Admitting: Podiatry

## 2018-08-31 NOTE — Telephone Encounter (Signed)
I spoke to the nurse last week and she was going to send a prescription of medihoney to [facility], [facility]. When I went to pick it up they said they didn't have it. Please call me back.

## 2018-08-31 NOTE — Telephone Encounter (Signed)
I called pt an informed the Medihoney had been confirmed [facility] by [facility] [address]. Pt [facility], neither carried the Medihoney, but [facility] would have in store tomorrow.

## 2018-09-07 ENCOUNTER — Encounter: Payer: Self-pay | Admitting: Cardiovascular Disease

## 2018-09-07 ENCOUNTER — Ambulatory Visit: Payer: Medicare Other | Admitting: Cardiovascular Disease

## 2018-09-07 VITALS — BP 110/66 | HR 92 | Ht 70.0 in | Wt 305.0 lb

## 2018-09-07 DIAGNOSIS — L97509 Non-pressure chronic ulcer of other part of unspecified foot with unspecified severity: Secondary | ICD-10-CM

## 2018-09-07 DIAGNOSIS — E785 Hyperlipidemia, unspecified: Secondary | ICD-10-CM | POA: Insufficient documentation

## 2018-09-07 DIAGNOSIS — L97519 Non-pressure chronic ulcer of other part of right foot with unspecified severity: Secondary | ICD-10-CM

## 2018-09-07 DIAGNOSIS — E11621 Type 2 diabetes mellitus with foot ulcer: Secondary | ICD-10-CM | POA: Diagnosis not present

## 2018-09-07 DIAGNOSIS — I739 Peripheral vascular disease, unspecified: Secondary | ICD-10-CM

## 2018-09-07 NOTE — Assessment & Plan Note (Signed)
History of essential hypertension her blood pressure measured today at 110/66.  He is on lisinopril and hydrochlorothiazide.  Continue current medications.

## 2018-09-07 NOTE — Assessment & Plan Note (Signed)
History of hyperlipidemia on statin therapy with lipid profile performed 07/09/2018 revealing cholesterol 120, LDL 43 and HDL of 32.

## 2018-09-07 NOTE — Progress Notes (Signed)
09/07/2018 Troy [name] [place]   06-Jul-1966  [id number]  Primary Physician Seward Carol, MD Primary Cardiologist: Lorretta Harp MD [place], [place]  HPI:  Troy [name] is a 53 y.o. severely overweight married African-American male father of 50, grandfather 2 grandchildren who was referred by Dr. March Rummage, his podiatrist, for peripheral vascular valuation because of right great toe ulcer.  His cardiovascular risk factor profile is notable for treated hypertension, diabetes and hyperlipidemia.  There is no family history for heart disease.  Is never had a heart attack or stroke.  Denies chest pain or shortness of breath.  He did have a left BKA performed by Dr. [name] 7/17 for Charcot foot and osteomyelitis.  He developed an ulcer on his right great toe several months ago probably related to his shoe and local trauma.  This has been offloaded.  He has been getting local wound care by Dr. March Rummage and apparently this has been improving.  Recent segmental pressures performed in our office 07/26/2018 showed noncompressible vessels on the right with triphasic waveforms.  ABI could not be put obtained although the TBI was 0.85 suggesting adequate circulation for healing.   Current Meds  Medication Sig  . albuterol (PROVENTIL HFA;VENTOLIN HFA) 108 (90 Base) MCG/ACT inhaler Inhale 1-2 puffs into the lungs every 6 (six) hours as needed for wheezing or shortness of breath.  Mar[place] Kitchen albuterol (PROVENTIL) (2.5 MG/3ML) 0.083% nebulizer solution Take 2.5 mg by nebulization every 4 (four) hours as needed for wheezing.   [place] Kitchen aspirin (ASPIRIN EC) 81 MG EC tablet Take 81 mg by mouth daily. Swallow whole.  [place] Kitchen atorvastatin (LIPITOR) 10 MG tablet Take 10 mg by mouth daily.  [place] Kitchen BREO ELLIPTA 100-25 MCG/INH AEPB Inhale 1 puff into the lungs daily.   . citalopram (CELEXA) 10 MG tablet Take 10 mg by mouth daily.  . CONTOUR NEXT TEST test strip 1 each daily as needed.  . fluticasone (FLONASE) 50 MCG/ACT nasal spray Place  2 sprays into both nostrils daily.  [place] Kitchen glimepiride (AMARYL) 4 MG tablet Take 4 mg by mouth daily with breakfast.  . LANTUS SOLOSTAR 100 UNIT/ML Solostar Pen Inject 10 Units into the skin at bedtime.   [place] Kitchen lisinopril-hydrochlorothiazide (PRINZIDE,ZESTORETIC) 10-12.5 MG tablet Take 1 tablet by mouth daily.   . metFORMIN (GLUCOPHAGE) 1000 MG tablet Take 1,000 mg by mouth 2 (two) times daily.  [place] Kitchen MICROLET LANCETS MISC 1 each daily as needed.  . Multiple Vitamin (MULTIVITAMIN WITH MINERALS) TABS tablet Take 1 tablet by mouth daily.  . pantoprazole (PROTONIX) 40 MG tablet Take 40 mg by mouth daily.   . Wound Dressings (MEDIHONEY WOUND/BURN DRESSING) GEL Apply to affected are 3 times a week, and cover with sterile dressing.     Allergies  Allergen Reactions  . No Known Allergies     Social History   Socioeconomic History  . Marital status: Married    Spouse name: Not on file  . Number of children: Not on file  . Years of education: Not on file  . Highest education level: Not on file  Occupational History  . Not on file  Social Needs  . Financial resource strain: Not on file  . Food insecurity:    Worry: Not on file    Inability: Not on file  . Transportation needs:    Medical: Not on file    Non-medical: Not on file  Tobacco Use  . Smoking status: Never Smoker  . Smokeless tobacco: Never Used  Substance and Sexual Activity  . Alcohol use: Yes    Comment: occasionally  . Drug use: No  . Sexual activity: Not on file  Lifestyle  . Physical activity:    Days per week: Not on file    Minutes per session: Not on file  . Stress: Not on file  Relationships  . Social connections:    Talks on phone: Not on file    Gets together: Not on file    Attends religious service: Not on file    Active member of club or organization: Not on file    Attends meetings of clubs or organizations: Not on file    Relationship status: Not on file  . Intimate partner violence:    Fear of current or ex  partner: Not on file    Emotionally abused: Not on file    Physically abused: Not on file    Forced sexual activity: Not on file  Other Topics Concern  . Not on file  Social History Narrative  . Not on file     Review of Systems: General: negative for chills, fever, night sweats or weight changes.  Cardiovascular: negative for chest pain, dyspnea on exertion, edema, orthopnea, palpitations, paroxysmal nocturnal dyspnea or shortness of breath Dermatological: negative for rash Respiratory: negative for cough or wheezing Urologic: negative for hematuria Abdominal: negative for nausea, vomiting, diarrhea, bright red blood per rectum, melena, or hematemesis Neurologic: negative for visual changes, syncope, or dizziness All other systems reviewed and are otherwise negative except as noted above.    Blood pressure 110/66, pulse 92, height 5\' 10"  (1.778 m), weight (!) 305 lb (138.3 kg).  General appearance: alert and no distress Neck: no adenopathy, no carotid bruit, no JVD, supple, symmetrical, trachea midline and thyroid not enlarged, symmetric, no tenderness/mass/nodules Lungs: clear to auscultation bilaterally Heart: regular rate and rhythm, S1, S2 normal, no murmur, click, rub or gallop Extremities: extremities normal, atraumatic, no cyanosis or edema Pulses: Left BKA, 2+ right dorsalis pedis pulse, faint posterior tibial pulse on the right. Skin: Diabetic ulcer right great toe Neurologic: Alert and oriented X 3, normal strength and tone. Normal symmetric reflexes. Normal coordination and gait  EKG not performed today  ASSESSMENT AND PLAN:   Hypertension History of essential hypertension her blood pressure measured today at 110/66.  He is on lisinopril and hydrochlorothiazide.  Continue current medications.  Hyperlipidemia History of hyperlipidemia on statin therapy with lipid profile performed 07/09/2018 revealing cholesterol 120, LDL 43 and HDL of 32.  Diabetic foot ulcer  [facility]) Mr. Rigel was referred to me by Dr. March Rummage, his podiatrist for a diabetic foot ulcer on his first hallux.  He is status post left BKA by Dr. Inda Merlin 7/17 for Charcot foot and osteomyelitis.  He does have risk factors include diabetes, hypertension hyperlipidemia.  He had recent Doppler studies that showed triphasic signals down to his foot with ABIs that could not be calculated because of noncompressible vessels.  Apparently his wound was related to trauma from a shoe.  He is being offloaded.  He has been seen frequently Dr. March Rummage for wound care and apparently the ulcer is improving.  He has a strong dorsalis pedis on that side.  I am going to get lower extremity arterial Doppler studies to further evaluate.  Apparently Dr. March Rummage has the intention of performing an invasive procedure to "shave the bone".  We will need to see whether his circulation will allow healing of that.  Lorretta Harp MD FACP,FACC,FAHA, [facility] 09/07/2018 4:20 PM

## 2018-09-07 NOTE — Assessment & Plan Note (Signed)
Mr. Hawkes was referred to me by Dr. March Rummage, his podiatrist for a diabetic foot ulcer on his first hallux.  He is status post left BKA by Dr. Inda Merlin 7/17 for Charcot foot and osteomyelitis.  He does have risk factors include diabetes, hypertension hyperlipidemia.  He had recent Doppler studies that showed triphasic signals down to his foot with ABIs that could not be calculated because of noncompressible vessels.  Apparently his wound was related to trauma from a shoe.  He is being offloaded.  He has been seen frequently Dr. March Rummage for wound care and apparently the ulcer is improving.  He has a strong dorsalis pedis on that side.  I am going to get lower extremity arterial Doppler studies to further evaluate.  Apparently Dr. March Rummage has the intention of performing an invasive procedure to "shave the bone".  We will need to see whether his circulation will allow healing of that.

## 2018-09-07 NOTE — Patient Instructions (Signed)
Medication Instructions:  Your physician recommends that you continue on your current medications as directed. Please refer to the Current Medication list given to you today.  If you need a refill on your cardiac medications before your next appointment, please call your pharmacy.   Lab work: NONE If you have labs (blood work) drawn today and your tests are completely normal, you will receive your results only by: Marland Kitchen MyChart Message (if you have MyChart) OR . A paper copy in the mail If you have any lab test that is abnormal or we need to change your treatment, we will call you to review the results.  Testing/Procedures: Your physician has requested that you have a lower or upper extremity arterial duplex. This test is an ultrasound of the arteries in the legs or arms. It looks at arterial blood flow in the legs and arms. Allow one hour for Lower and Upper Arterial scans. There are no restrictions or special instructions  Your physician has requested that you have an ankle brachial index (ABI). During this test an ultrasound and blood pressure cuff are used to evaluate the arteries that supply the arms and legs with blood. Allow thirty minutes for this exam. There are no restrictions or special instructions.   Follow-Up: At [facility], you and your health needs are our priority.  As part of our continuing mission to provide you with exceptional heart care, we have created designated Provider Care Teams.  These Care Teams include your primary Cardiologist (physician) and Advanced Practice Providers (APPs -  Physician Assistants and Nurse Practitioners) who all work together to provide you with the care you need, when you need it. . You will need a follow up appointment in 3 months.  Please call our office 2 months in advance to schedule this appointment.  You may see Dr. Gwenlyn Found or one of the following Advanced Practice Providers on your designated Care Team:   . Kerin Ransom, [place] . Almyra Deforest,  PA-C . Fabian Sharp, PA-C . Jory Sims, DNP . Rosaria Ferries, PA-C . Roby Lofts, PA-C . Sande Rives, PA-C

## 2018-09-29 ENCOUNTER — Ambulatory Visit (HOSPITAL_COMMUNITY)
Admission: RE | Admit: 2018-09-29 | Discharge: 2018-09-29 | Disposition: A | Discharge status: Home / Self Care | Payer: Medicare Other | Source: Ambulatory Visit | Attending: Cardiovascular Disease | Admitting: Cardiovascular Disease

## 2018-09-29 ENCOUNTER — Encounter (HOSPITAL_COMMUNITY): Payer: Self-pay

## 2018-09-29 DIAGNOSIS — I739 Peripheral vascular disease, unspecified: Secondary | ICD-10-CM | POA: Insufficient documentation

## 2018-10-28 ENCOUNTER — Ambulatory Visit: Payer: Medicare Other | Admitting: Podiatry

## 2018-11-04 ENCOUNTER — Ambulatory Visit: Payer: Medicare Other | Admitting: Podiatry

## 2018-11-04 ENCOUNTER — Other Ambulatory Visit: Payer: Medicare Other | Admitting: Orthotics

## 2018-11-10 MED FILL — UNIFINE PENTIPS 6MM 31G: 31G X 6 MM | 100 days supply | Qty: 100 | Fill #0

## 2018-11-11 ENCOUNTER — Other Ambulatory Visit: Payer: Medicare Other | Admitting: Orthotics

## 2018-11-11 ENCOUNTER — Telehealth (INDEPENDENT_AMBULATORY_CARE_PROVIDER_SITE_OTHER): Payer: Medicare Other | Admitting: Podiatry

## 2018-11-11 ENCOUNTER — Other Ambulatory Visit: Payer: Self-pay

## 2018-11-11 DIAGNOSIS — L97519 Non-pressure chronic ulcer of other part of right foot with unspecified severity: Secondary | ICD-10-CM | POA: Diagnosis not present

## 2018-11-11 DIAGNOSIS — E11621 Type 2 diabetes mellitus with foot ulcer: Secondary | ICD-10-CM

## 2018-11-11 NOTE — Progress Notes (Signed)
Virtual Visit via Video Note  I connected with Boyce Keltner Karn on 11/11/18 at  2:15 PM EDT by a video enabled telemedicine application and verified that I am speaking with the correct person using two identifiers.   I discussed the limitations of evaluation and management by telemedicine and the availability of in person appointments. The patient expressed understanding and agreed to proceed.  The patient's connection was poor and after several minutes the video cut out. The visit was then completed by telephone.   History of Present Illness: Patient states that the wound has been doing very well and appears to be healed.  He states he has been trying to wear normal shoes on and off.   Observations/Objective: Wound visualized appears healed.  No apparent drainage no signs of infection  Assessment and Plan: Diabetic ulcer appears to be healed.  Encourage patient to continue to trial normal shoe gear.  Patient requested a surgical shoe.  Will have one on hold for him to pick up tomorrow  Follow Up Instructions: We will have patient follow-up as needed for the wound.  We will get him an appointment to come back in a month for diabetic shoes   I discussed the assessment and treatment plan with the patient. The patient was provided an opportunity to ask questions and all were answered. The patient agreed with the plan and demonstrated an understanding of the instructions.   The patient was advised to call back or seek an in-person evaluation if the symptoms worsen or if the condition fails to improve as anticipated.  I provided 10 minutes of non-face-to-face time during this encounter.   Evelina Bucy, DPM

## 2018-11-15 ENCOUNTER — Telehealth: Payer: Self-pay | Admitting: Podiatry

## 2018-11-15 NOTE — Telephone Encounter (Signed)
Pts wife came to pick up a surgery shoe for patient and she was given a shoe for the wrong foot. Pts wife will be bringing the wrong shoe back today to switch them out. Patient needs a Large Right surgery shoe.

## 2018-12-21 ENCOUNTER — Ambulatory Visit: Payer: Medicare Other | Admitting: Orthotics

## 2018-12-21 ENCOUNTER — Other Ambulatory Visit: Payer: Self-pay

## 2018-12-21 DIAGNOSIS — L97519 Non-pressure chronic ulcer of other part of right foot with unspecified severity: Secondary | ICD-10-CM

## 2018-12-21 DIAGNOSIS — L97411 Non-pressure chronic ulcer of right heel and midfoot limited to breakdown of skin: Secondary | ICD-10-CM

## 2018-12-21 DIAGNOSIS — E11621 Type 2 diabetes mellitus with foot ulcer: Secondary | ICD-10-CM

## 2018-12-21 DIAGNOSIS — E08621 Diabetes mellitus due to underlying condition with foot ulcer: Secondary | ICD-10-CM

## 2018-12-21 DIAGNOSIS — I739 Peripheral vascular disease, unspecified: Secondary | ICD-10-CM

## 2018-12-21 NOTE — Progress Notes (Signed)

## 2018-12-22 ENCOUNTER — Ambulatory Visit: Payer: Medicare Other | Admitting: Cardiovascular Disease

## 2018-12-31 ENCOUNTER — Encounter: Payer: Self-pay | Admitting: Cardiovascular Disease

## 2018-12-31 ENCOUNTER — Ambulatory Visit: Payer: Medicare Other | Admitting: Cardiovascular Disease

## 2018-12-31 ENCOUNTER — Other Ambulatory Visit: Payer: Self-pay

## 2018-12-31 DIAGNOSIS — L97519 Non-pressure chronic ulcer of other part of right foot with unspecified severity: Secondary | ICD-10-CM

## 2018-12-31 DIAGNOSIS — I10 Essential (primary) hypertension: Secondary | ICD-10-CM | POA: Diagnosis not present

## 2018-12-31 DIAGNOSIS — E782 Mixed hyperlipidemia: Secondary | ICD-10-CM

## 2018-12-31 DIAGNOSIS — E11621 Type 2 diabetes mellitus with foot ulcer: Secondary | ICD-10-CM

## 2018-12-31 NOTE — Patient Instructions (Signed)
Medication Instructions:  Your physician recommends that you continue on your current medications as directed. Please refer to the Current Medication list given to you today.  If you need a refill on your cardiac medications before your next appointment, please call your pharmacy.   Lab work: NONE If you have labs (blood work) drawn today and your tests are completely normal, you will receive your results only by: . MyChart Message (if you have MyChart) OR . A paper copy in the mail If you have any lab test that is abnormal or we need to change your treatment, we will call you to review the results.  Testing/Procedures: NONE  Follow-Up: At [facility], you and your health needs are our priority.  As part of our continuing mission to provide you with exceptional heart care, we have created designated Provider Care Teams.  These Care Teams include your primary Cardiologist (physician) and Advanced Practice Providers (APPs -  Physician Assistants and Nurse Practitioners) who all work together to provide you with the care you need, when you need it. . You will need a follow up appointment AS NEEDED. You may see Dr. Berry or one of the following Advanced Practice Providers on your designated Care Team:   . Luke Kilroy, PA-C . Hao Meng, PA-C . Angela Duke, PA-C . Kathryn Lawrence, DNP . Rhonda Barrett, PA-C . Krista Kroeger, PA-C . Callie Goodrich, PA-C    

## 2018-12-31 NOTE — Assessment & Plan Note (Signed)
History of hyperlipidemia on atorvastatin with lipid profile performed 07/09/2018 revealing total cholesterol 120, LDL 43 and HDL 32.

## 2018-12-31 NOTE — Assessment & Plan Note (Signed)
History of essential hypertension with blood pressure measured today at 136/83.  He is on lisinopril thiazide.

## 2018-12-31 NOTE — Addendum Note (Signed)
Addended by: Zebedee Iba on: 12/31/2018 03:12 PM   Modules accepted: Orders

## 2018-12-31 NOTE — Assessment & Plan Note (Signed)
History of diabetic foot ulcer on his right great metatarsal head which has healed nicely since I saw him 4 months ago.  Dopplers performed 09/30/2018 revealed noncompressible right lower extremity pedal vessels with a right TBI of 1.16 triphasic waveforms tibial vessels.  I suspect he potentially has small vessel diabetic disease.  I will see him back as needed.

## 2018-12-31 NOTE — Progress Notes (Signed)
12/31/2018 Troy [name] [place]   1966/03/05  [id number]  Primary Physician Seward Carol, MD Primary Cardiologist: Troy Harp MD Lupe Carney, [place]  HPI:  Troy [name] is a 53 y.o.  severely overweight married African-American male father of 62, grandfather 2 grandchildren who was referred by Dr. March [name], his podiatrist, for peripheral vascular valuation because of right great toe ulcer.  I last saw him in the office 09/07/2018. His cardiovascular risk factor profile is notable for treated hypertension, diabetes and hyperlipidemia.  There is no family history for heart disease.  Is never had a heart attack or stroke.  Denies chest pain or shortness of breath.  He did have a left BKA performed by Dr. Lorin [name] 7/17 for Charcot foot and osteomyelitis.  He developed an ulcer on his right great toe several months ago probably related to his shoe and local trauma.  This has been offloaded.  He has been getting local wound care by Dr. March [name] and apparently this has been improving.  Recent segmental pressures performed in our office 07/26/2018 showed noncompressible vessels on the right with triphasic waveforms.  ABI could not be put obtained although the TBI was 0.85 suggesting adequate circulation for healing.  Since I saw him 4 months ago he has had repeat Doppler studies performed 09/30/2018 that showed a right TBI 1.16 with normal first digit waveforms and triphasic waveforms above.  His wound has almost completely healed.  He walks with a prosthesis on the left.  He denies pain in his toe or foot, shortness of breath or chest pain.   Current Meds  Medication Sig  . albuterol (PROVENTIL HFA;VENTOLIN HFA) 108 (90 Base) MCG/ACT inhaler Inhale 1-2 puffs into the lungs every 6 (six) hours as needed for wheezing or shortness of breath.  Marland Kitchen albuterol (PROVENTIL) (2.5 MG/3ML) 0.083% nebulizer solution Take 2.5 mg by nebulization every 4 (four) hours as needed for wheezing.   Marland Kitchen aspirin (ASPIRIN EC) 81  MG EC tablet Take 81 mg by mouth daily. Swallow whole.  Marland Kitchen atorvastatin (LIPITOR) 10 MG tablet Take 10 mg by mouth daily.  Marland Kitchen BREO ELLIPTA 100-25 MCG/INH AEPB Inhale 1 puff into the lungs daily.   . CONTOUR NEXT TEST test strip 1 each daily as needed.  . fluticasone (FLONASE) 50 MCG/ACT nasal spray Place 2 sprays into both nostrils daily.  Marland Kitchen glimepiride (AMARYL) 4 MG tablet Take 4 mg by mouth daily with breakfast.  . LANTUS SOLOSTAR 100 UNIT/ML Solostar Pen Inject 10 Units into the skin at bedtime.   Marland Kitchen lisinopril-hydrochlorothiazide (PRINZIDE,ZESTORETIC) 10-12.5 MG tablet Take 1 tablet by mouth daily.   . metFORMIN (GLUCOPHAGE) 1000 MG tablet Take 1,000 mg by mouth 2 (two) times daily.  Marland Kitchen MICROLET LANCETS MISC 1 each daily as needed.  . Multiple Vitamin (MULTIVITAMIN WITH MINERALS) TABS tablet Take 1 tablet by mouth daily.  . pantoprazole (PROTONIX) 40 MG tablet Take 40 mg by mouth daily.   . Wound Dressings (MEDIHONEY WOUND/BURN DRESSING) GEL Apply to affected are 3 times a week, and cover with sterile dressing.     Allergies  Allergen Reactions  . No Known Allergies     Social History   Socioeconomic History  . Marital status: Married    Spouse name: Not on file  . Number of children: Not on file  . Years of education: Not on file  . Highest education level: Not on file  Occupational History  . Not on file  Social Needs  .  Financial resource strain: Not on file  . Food insecurity:    Worry: Not on file    Inability: Not on file  . Transportation needs:    Medical: Not on file    Non-medical: Not on file  Tobacco Use  . Smoking status: Never Smoker  . Smokeless tobacco: Never Used  Substance and Sexual Activity  . Alcohol use: Yes    Comment: occasionally  . Drug use: No  . Sexual activity: Not on file  Lifestyle  . Physical activity:    Days per week: Not on file    Minutes per session: Not on file  . Stress: Not on file  Relationships  . Social connections:     Talks on phone: Not on file    Gets together: Not on file    Attends religious service: Not on file    Active member of club or organization: Not on file    Attends meetings of clubs or organizations: Not on file    Relationship status: Not on file  . Intimate partner violence:    Fear of current or ex partner: Not on file    Emotionally abused: Not on file    Physically abused: Not on file    Forced sexual activity: Not on file  Other Topics Concern  . Not on file  Social History Narrative  . Not on file     Review of Systems: General: negative for chills, fever, night sweats or weight changes.  Cardiovascular: negative for chest pain, dyspnea on exertion, edema, orthopnea, palpitations, paroxysmal nocturnal dyspnea or shortness of breath Dermatological: negative for rash Respiratory: negative for cough or wheezing Urologic: negative for hematuria Abdominal: negative for nausea, vomiting, diarrhea, bright red blood per rectum, melena, or hematemesis Neurologic: negative for visual changes, syncope, or dizziness All other systems reviewed and are otherwise negative except as noted above.    Blood pressure 136/83, pulse 92, height 5\' 10"  (1.778 m), weight (!) 302 lb (137 kg).  General appearance: alert and no distress Neck: no adenopathy, no carotid bruit, no JVD, supple, symmetrical, trachea midline and thyroid not enlarged, symmetric, no tenderness/mass/nodules Lungs: clear to auscultation bilaterally Heart: regular rate and rhythm, S1, S2 normal, no murmur, click, rub or gallop Extremities: extremities normal, atraumatic, no cyanosis or edema Pulses: 2+ and symmetric Skin: Diabetic ulcer on the right fifth metatarsal head is almost completely healed Neurologic: Alert and oriented X 3, normal strength and tone. Normal symmetric reflexes. Normal coordination and gait  EKG sinus rhythm at 92 without ST or T wave changes.  There was poor R wave progression.  I personally reviewed  this EKG.  ASSESSMENT AND PLAN:   Hypertension History of essential hypertension with blood pressure measured today at 136/83.  He is on lisinopril thiazide.  Hyperlipidemia History of hyperlipidemia on atorvastatin with lipid profile performed 07/09/2018 revealing total cholesterol 120, LDL 43 and HDL 32.  Diabetic foot ulcer ([place]) History of diabetic foot ulcer on his right great metatarsal head which has healed nicely since I saw him 4 months ago.  Dopplers performed 09/30/2018 revealed noncompressible right lower extremity pedal vessels with a right TBI of 1.16 triphasic waveforms tibial vessels.  I suspect he potentially has small vessel diabetic disease.  I will see him back as needed.      Troy Harp MD FACP,FACC,FAHA, [facility] 12/31/2018 1:34 PM

## 2019-02-01 ENCOUNTER — Other Ambulatory Visit: Payer: Self-pay | Admitting: Podiatry

## 2019-02-01 ENCOUNTER — Telehealth: Payer: Self-pay | Admitting: *Deleted

## 2019-02-01 MED ORDER — CEPHALEXIN 500 MG PO CAPS: 500.0000 mg | ORAL_CAPSULE | Freq: Two times a day (BID) | ORAL | 0 refills | Status: DC

## 2019-02-01 NOTE — Telephone Encounter (Signed)
Doesn't look acutely infected but we can send an antibiotic just in case. I'll see him on Thursday. I'll send can you inform?

## 2019-02-01 NOTE — Telephone Encounter (Signed)
I called pt to inform that the photo had not come to my email.

## 2019-02-01 NOTE — Telephone Encounter (Signed)
I informed pt of Dr. Eleanora Neighbor orders and that he would still like to see pt on Thursday. Pt states understanding and requested the antibiotic be sent to [facility] on [address]. I changed to the preferred [id number].

## 2019-02-01 NOTE — Telephone Encounter (Signed)
I spoke with pt and offered an appt in [place] tomorrow afternoon and pt states he can not make it there. Pt denies fever, drainage, redness or streaking, or swelling. I gave pt my email address and I will send to Dr. March Rummage.

## 2019-02-01 NOTE — Telephone Encounter (Signed)
Received a call and pt's voice was difficult to understand.

## 2019-02-02 ENCOUNTER — Telehealth: Payer: Self-pay | Admitting: Podiatry

## 2019-02-02 NOTE — Telephone Encounter (Signed)
Pt left message checking on status of diabetic shoes.  I returned call and they shoes should be shipping later this week or early next week. I told pt I would call when they come in. Pt also asked about a possible cost he is going to have to pay. I explained that we cannot bill them until they are picked up and it would be estimated to be around 80.00.

## 2019-02-03 ENCOUNTER — Other Ambulatory Visit: Payer: Self-pay

## 2019-02-03 ENCOUNTER — Ambulatory Visit (INDEPENDENT_AMBULATORY_CARE_PROVIDER_SITE_OTHER): Payer: Medicare Other | Admitting: Orthotics

## 2019-02-03 ENCOUNTER — Ambulatory Visit: Payer: Medicare Other | Admitting: Podiatry

## 2019-02-03 ENCOUNTER — Encounter: Payer: Self-pay | Admitting: Podiatry

## 2019-02-03 VITALS — Temp 97.2°F

## 2019-02-03 DIAGNOSIS — E08621 Diabetes mellitus due to underlying condition with foot ulcer: Secondary | ICD-10-CM

## 2019-02-03 DIAGNOSIS — I739 Peripheral vascular disease, unspecified: Secondary | ICD-10-CM

## 2019-02-03 DIAGNOSIS — L97411 Non-pressure chronic ulcer of right heel and midfoot limited to breakdown of skin: Secondary | ICD-10-CM

## 2019-02-03 DIAGNOSIS — E1151 Type 2 diabetes mellitus with diabetic peripheral angiopathy without gangrene: Secondary | ICD-10-CM

## 2019-02-03 DIAGNOSIS — Z89432 Acquired absence of left foot: Secondary | ICD-10-CM

## 2019-02-03 DIAGNOSIS — E1169 Type 2 diabetes mellitus with other specified complication: Secondary | ICD-10-CM

## 2019-02-03 DIAGNOSIS — B351 Tinea unguium: Secondary | ICD-10-CM

## 2019-02-03 DIAGNOSIS — E1140 Type 2 diabetes mellitus with diabetic neuropathy, unspecified: Secondary | ICD-10-CM | POA: Diagnosis not present

## 2019-02-03 DIAGNOSIS — M2011 Hallux valgus (acquired), right foot: Secondary | ICD-10-CM

## 2019-02-03 DIAGNOSIS — E1342 Other specified diabetes mellitus with diabetic polyneuropathy: Secondary | ICD-10-CM

## 2019-02-03 DIAGNOSIS — L89610 Pressure ulcer of right heel, unstageable: Secondary | ICD-10-CM | POA: Diagnosis not present

## 2019-02-03 NOTE — Progress Notes (Signed)
Patient came in today to pick up diabetic shoes and custom inserts.  Same was well pleased with fit and function.   The patient could ambulate without any discomfort; there were no signs of any quality issues. The foot ortheses offered full contact with plantar surface and contoured the arch well.   The shoes fit well with no heel slippage and areas of pressure concern.   Patient advised to contact us if any problems arise.  Patient also advised on how to report any issues.  Due to patient BKA left; only 3 right f/o were dispensed.

## 2019-02-03 NOTE — Progress Notes (Signed)
Subjective:  Patient ID: Troy [name], male    DOB: [birth year],  MRN: [id number]  Chief Complaint  Patient presents with  . Foot Ulcer    the spot on the right bunion is doig better and not any draining and there is two new spots that my wife found 2 days ago    53 y.o. male presents for wound care. History as above.  Review of Systems: Negative except as noted in the HPI. Denies N/V/F/Ch.  Past Medical History:  Diagnosis Date  . Asthma   . Bronchitis   . CKD (chronic kidney disease)   . Diabetes mellitus   . Diabetic Charcot foot ([place])    Left  . Gout   . Hypertension   . Kidney stones 11/30/2013  . Neuromuscular disorder ([place])   . Obesity   . Pancreatitis   . Polyneuropathy in diabetes [facility])     Current Outpatient Medications:  .  albuterol (PROVENTIL HFA;VENTOLIN HFA) 108 (90 Base) MCG/ACT inhaler, Inhale 1-2 puffs into the lungs every 6 (six) hours as needed for wheezing or shortness of breath., Disp: 1 Inhaler, Rfl: 0 .  albuterol (PROVENTIL) (2.5 MG/3ML) 0.083% nebulizer solution, Take 2.5 mg by nebulization every 4 (four) hours as needed for wheezing. , Disp: , Rfl: 5 .  aspirin (ASPIRIN EC) 81 MG EC tablet, Take 81 mg by mouth daily. Swallow whole., Disp: , Rfl:  .  atorvastatin (LIPITOR) 10 MG tablet, Take 10 mg by mouth daily., Disp: , Rfl: 10 .  BREO ELLIPTA 100-25 MCG/INH AEPB, Inhale 1 puff into the lungs daily. , Disp: , Rfl: 5 .  cephALEXin (KEFLEX) 500 MG capsule, Take 1 capsule (500 mg total) by mouth 2 (two) times daily., Disp: 14 capsule, Rfl: 0 .  CONTOUR NEXT TEST test strip, 1 each daily as needed., Disp: , Rfl: 5 .  fluticasone (FLONASE) 50 MCG/ACT nasal spray, Place 2 sprays into both nostrils daily., Disp: 16 g, Rfl: 0 .  glimepiride (AMARYL) 4 MG tablet, Take 4 mg by mouth daily with breakfast., Disp: , Rfl:  .  LANTUS SOLOSTAR 100 UNIT/ML Solostar Pen, Inject 10 Units into the skin at bedtime. , Disp: , Rfl: 11 .  lisinopril-hydrochlorothiazide  (PRINZIDE,ZESTORETIC) 10-12.5 MG tablet, Take 1 tablet by mouth daily. , Disp: , Rfl: 2 .  metFORMIN (GLUCOPHAGE) 1000 MG tablet, Take 1,000 mg by mouth 2 (two) times daily., Disp: , Rfl: 10 .  MICROLET LANCETS MISC, 1 each daily as needed., Disp: , Rfl: 5 .  Multiple Vitamin (MULTIVITAMIN WITH MINERALS) TABS tablet, Take 1 tablet by mouth daily., Disp: , Rfl:  .  pantoprazole (PROTONIX) 40 MG tablet, Take 40 mg by mouth daily. , Disp: , Rfl: 0 .  Wound Dressings (MEDIHONEY WOUND/BURN DRESSING) GEL, Apply to affected are 3 times a week, and cover with sterile dressing., Disp: 1 Tube, Rfl: 5  Social History   Tobacco Use  Smoking Status Never Smoker  Smokeless Tobacco Never Used    Allergies  Allergen Reactions  . No Known Allergies    Objective:   Vitals:   02/03/19 0917  Temp: (!) 97.2 F (36.2 C)   There is no height or weight on file to calculate BMI. Constitutional Well developed. Well nourished.  Vascular Dorsalis pedis pulses faintly palpable . Posterior tibial pulses non-palpable . Capillary refill normal to all digits.  No cyanosis or clubbing noted. Pedal hair growth normal.  Neurologic Normal speech. Oriented to person, place, and time. Protective sensation  absent  Dermatologic HPK plantar aspect of 2nd MPJ. HPK plantar right heel, no open ulcerations at either area. Healed ulcer right 1st MPJ.  Right 2nd toenail with lysis and healing wound bed with granular base.  Orthopedic: No pain to palpation right foot. History of left BKA,   Radiographs: None today. Assessment:   1. Diabetic ulcer of right midfoot associated with diabetes mellitus due to underlying condition, limited to breakdown of skin ([place])   2. PAD (peripheral artery disease) ([facility])   3. Hallux abducto valgus, right   4. Pressure ulcer, heel, right, unstageable ([place])   5. Onychomycosis of multiple toenails with type 2 diabetes mellitus and peripheral angiopathy ([place])    Plan:  Patient was  evaluated and treated and all questions answered.  Healed ulcer right 1st MPJ, HPK without ulceration right 2nd met and heel. -No open ulcerations noted. -DM shoes dispensed.  Nail lysis right 2nd toenail, onychomycosis right with hx of amputation left -Nails debrided x5 -Abx ointment and band-aid applied to right 2nd toe. Pt to apply daily.    Return in about 2 weeks (around 02/17/2019) for Wound Care, Right.

## 2019-02-17 ENCOUNTER — Ambulatory Visit: Payer: Medicare Other | Admitting: Podiatry

## 2019-02-18 ENCOUNTER — Other Ambulatory Visit: Payer: Self-pay

## 2019-02-18 ENCOUNTER — Ambulatory Visit: Payer: Medicare Other | Admitting: Podiatry

## 2019-02-18 VITALS — Temp 97.7°F

## 2019-02-18 DIAGNOSIS — M2011 Hallux valgus (acquired), right foot: Secondary | ICD-10-CM | POA: Diagnosis not present

## 2019-02-18 DIAGNOSIS — I739 Peripheral vascular disease, unspecified: Secondary | ICD-10-CM

## 2019-02-18 DIAGNOSIS — E08621 Diabetes mellitus due to underlying condition with foot ulcer: Secondary | ICD-10-CM

## 2019-02-18 DIAGNOSIS — L97411 Non-pressure chronic ulcer of right heel and midfoot limited to breakdown of skin: Secondary | ICD-10-CM | POA: Diagnosis not present

## 2019-02-18 NOTE — Progress Notes (Signed)
Subjective:  Patient ID: Troy [name], male    DOB: 04/20/1966,  MRN: [id number]  Chief Complaint  Patient presents with  . Wound Check    Right plantar heel wound check. Pt states no drainage. Pt denies fever/nausea/vomiting/chills. Some dead skin around wound. Pt states no concerns.    53 y.o. male presents for wound care. History as above.  Review of Systems: Negative except as noted in the HPI. Denies N/V/F/Ch.  Past Medical History:  Diagnosis Date  . Asthma   . Bronchitis   . CKD (chronic kidney disease)   . Diabetes mellitus   . Diabetic Charcot foot ([place])    Left  . Gout   . Hypertension   . Kidney stones 11/30/2013  . Neuromuscular disorder ([place])   . Obesity   . Pancreatitis   . Polyneuropathy in diabetes [facility])     Current Outpatient Medications:  .  albuterol (PROVENTIL HFA;VENTOLIN HFA) 108 (90 Base) MCG/ACT inhaler, Inhale 1-2 puffs into the lungs every 6 (six) hours as needed for wheezing or shortness of breath., Disp: 1 Inhaler, Rfl: 0 .  albuterol (PROVENTIL) (2.5 MG/3ML) 0.083% nebulizer solution, Take 2.5 mg by nebulization every 4 (four) hours as needed for wheezing. , Disp: , Rfl: 5 .  aspirin (ASPIRIN EC) 81 MG EC tablet, Take 81 mg by mouth daily. Swallow whole., Disp: , Rfl:  .  atorvastatin (LIPITOR) 10 MG tablet, Take 10 mg by mouth daily., Disp: , Rfl: 10 .  Blood Glucose Monitoring Suppl (ACCU-CHEK AVIVA PLUS) w/Device KIT, USE TO TEST YOUR BLOOD SUGAR THREE TIMES A DAY, Disp: , Rfl:  .  BREO ELLIPTA 100-25 MCG/INH AEPB, Inhale 1 puff into the lungs daily. , Disp: , Rfl: 5 .  cephALEXin (KEFLEX) 500 MG capsule, Take 1 capsule (500 mg total) by mouth 2 (two) times daily., Disp: 14 capsule, Rfl: 0 .  Colchicine 0.6 MG CAPS, , Disp: , Rfl:  .  CONTOUR NEXT TEST test strip, 1 each daily as needed., Disp: , Rfl: 5 .  fluticasone (FLONASE) 50 MCG/ACT nasal spray, Place 2 sprays into both nostrils daily., Disp: 16 g, Rfl: 0 .  glimepiride (AMARYL) 4 MG  tablet, Take 4 mg by mouth daily with breakfast., Disp: , Rfl:  .  LANTUS SOLOSTAR 100 UNIT/ML Solostar Pen, Inject 10 Units into the skin at bedtime. , Disp: , Rfl: 11 .  lisinopril-hydrochlorothiazide (PRINZIDE,ZESTORETIC) 10-12.5 MG tablet, Take 1 tablet by mouth daily. , Disp: , Rfl: 2 .  metFORMIN (GLUCOPHAGE) 1000 MG tablet, Take 1,000 mg by mouth 2 (two) times daily., Disp: , Rfl: 10 .  MICROLET LANCETS MISC, 1 each daily as needed., Disp: , Rfl: 5 .  Multiple Vitamin (MULTIVITAMIN WITH MINERALS) TABS tablet, Take 1 tablet by mouth daily., Disp: , Rfl:  .  pantoprazole (PROTONIX) 40 MG tablet, Take 40 mg by mouth daily. , Disp: , Rfl: 0 .  sildenafil (VIAGRA) 100 MG tablet, , Disp: , Rfl:  .  traMADol (ULTRAM) 50 MG tablet, , Disp: , Rfl:  .  Wound Dressings (MEDIHONEY WOUND/BURN DRESSING) GEL, Apply to affected are 3 times a week, and cover with sterile dressing., Disp: 1 Tube, Rfl: 5  Social History   Tobacco Use  Smoking Status Never Smoker  Smokeless Tobacco Never Used    Allergies  Allergen Reactions  . No Known Allergies    Objective:   Vitals:   02/18/19 1036  Temp: 97.7 F (36.5 C)   There is no  height or weight on file to calculate BMI. Constitutional Well developed. Well nourished.  Vascular Dorsalis pedis pulses faintly palpable . Posterior tibial pulses non-palpable . Capillary refill normal to all digits.  No cyanosis or clubbing noted. Pedal hair growth normal.  Neurologic Normal speech. Oriented to person, place, and time. Protective sensation absent  Dermatologic HPK plantar aspect of 2nd MPJ. HPK plantar right heel, no open ulcerations at either area. Right 1st MPJ with abrasion without open ulcer.  Right 2nd toenail site well healed.  Orthopedic: No pain to palpation right foot. History of left BKA,   Radiographs: None today. Assessment:   No diagnosis found. Plan:  Patient was evaluated and treated and all questions answered.  Healed ulcer  right 1st MPJ, HPK without ulceration right 2nd met and heel. -Skin abrasion right 1st MPJ. Offloaded with Tube foam. -No other open ulceration.  Nail lysis right 2nd toenail, onychomycosis right with hx of amputation left -Nail appears healed.   No follow-ups on file.

## 2019-03-31 ENCOUNTER — Other Ambulatory Visit: Payer: Self-pay

## 2019-03-31 ENCOUNTER — Ambulatory Visit: Payer: Medicare Other | Admitting: Podiatry

## 2019-03-31 DIAGNOSIS — M2011 Hallux valgus (acquired), right foot: Secondary | ICD-10-CM | POA: Diagnosis not present

## 2019-03-31 DIAGNOSIS — E08621 Diabetes mellitus due to underlying condition with foot ulcer: Secondary | ICD-10-CM

## 2019-03-31 DIAGNOSIS — L97419 Non-pressure chronic ulcer of right heel and midfoot with unspecified severity: Secondary | ICD-10-CM

## 2019-03-31 DIAGNOSIS — I739 Peripheral vascular disease, unspecified: Secondary | ICD-10-CM

## 2019-03-31 NOTE — Patient Instructions (Signed)
Pre-Operative Instructions  Congratulations, you have decided to take an important step towards improving your quality of life.  You can be assured that the doctors and staff at [facility] will be with you every step of the way.  Here are some important things you should know:  1. Plan to be at the surgery center/hospital at least 1 (one) hour prior to your scheduled time, unless otherwise directed by the surgical center/hospital staff.  You must have a responsible adult accompany you, remain during the surgery and drive you home.  Make sure you have directions to the surgical center/hospital to ensure you arrive on time. 2. If you are having surgery at [facility] or [facility], you will need a copy of your medical history and physical form from your family physician within one month prior to the date of surgery. We will give you a form for your primary physician to complete.  3. We make every effort to accommodate the date you request for surgery.  However, there are times where surgery dates or times have to be moved.  We will contact you as soon as possible if a change in schedule is required.   4. No aspirin/ibuprofen for one week before surgery.  If you are on aspirin, any non-steroidal anti-inflammatory medications (Mobic, Aleve, Ibuprofen) should not be taken seven (7) days prior to your surgery.  You make take Tylenol for pain prior to surgery.  5. Medications - If you are taking daily heart and blood pressure medications, seizure, reflux, allergy, asthma, anxiety, pain or diabetes medications, make sure you notify the surgery center/hospital before the day of surgery so they can tell you which medications you should take or avoid the day of surgery. 6. No food or drink after midnight the night before surgery unless directed otherwise by surgical center/hospital staff. 7. No alcoholic beverages 24-hours prior to surgery.  No smoking 24-hours prior or 24-hours after  surgery. 8. Wear loose pants or shorts. They should be loose enough to fit over bandages, boots, and casts. 9. Don't wear slip-on shoes. Sneakers are preferred. 10. Bring your boot with you to the surgery center/hospital.  Also bring crutches or a walker if your physician has prescribed it for you.  If you do not have this equipment, it will be provided for you after surgery. 11. If you have not been contacted by the surgery center/hospital by the day before your surgery, call to confirm the date and time of your surgery. 12. Leave-time from work may vary depending on the type of surgery you have.  Appropriate arrangements should be made prior to surgery with your employer. 13. Prescriptions will be provided immediately following surgery by your doctor.  Fill these as soon as possible after surgery and take the medication as directed. Pain medications will not be refilled on weekends and must be approved by the doctor. 14. Remove nail polish on the operative foot and avoid getting pedicures prior to surgery. 15. Wash the night before surgery.  The night before surgery wash the foot and leg well with water and the antibacterial soap provided. Be sure to pay special attention to beneath the toenails and in between the toes.  Wash for at least three (3) minutes. Rinse thoroughly with water and dry well with a towel.  Perform this wash unless told not to do so by your physician.  Enclosed: 1 Ice pack (please put in freezer the night before surgery)   1 Hibiclens skin cleaner     Pre-op instructions  If you have any questions regarding the instructions, please do not hesitate to call our office.  [place]: [address], [place], [place] [address] -- [phone]  [place]: [address] [place], [place] [address] -- [phone]  [place]: [address]  [place], [place] [address] -- [phone]  [place]: [address], [address], [place], [place] [address] -- [phone]  Website:  [email or web address] 

## 2019-04-01 ENCOUNTER — Ambulatory Visit: Payer: Medicare Other | Admitting: Podiatry

## 2019-04-03 NOTE — Progress Notes (Signed)
Subjective:  Patient ID: Troy [name], male    DOB: 1965-09-03,  MRN: [id number]  Chief Complaint  Patient presents with  . Diabetic Ulcer    right foot 6 week follow up - patient belives it looks better, he has been wearing tube foam as instructed    53 y.o. male presents for wound care. History as above.  Review of Systems: Negative except as noted in the HPI. Denies N/V/F/Ch.  Past Medical History:  Diagnosis Date  . Asthma   . Bronchitis   . CKD (chronic kidney disease)   . Diabetes mellitus   . Diabetic Charcot foot ([place])    Left  . Gout   . Hypertension   . Kidney stones 11/30/2013  . Neuromuscular disorder ([place])   . Obesity   . Pancreatitis   . Polyneuropathy in diabetes [facility])     Current Outpatient Medications:  .  albuterol (PROVENTIL HFA;VENTOLIN HFA) 108 (90 Base) MCG/ACT inhaler, Inhale 1-2 puffs into the lungs every 6 (six) hours as needed for wheezing or shortness of breath., Disp: 1 Inhaler, Rfl: 0 .  albuterol (PROVENTIL) (2.5 MG/3ML) 0.083% nebulizer solution, Take 2.5 mg by nebulization every 4 (four) hours as needed for wheezing. , Disp: , Rfl: 5 .  aspirin (ASPIRIN EC) 81 MG EC tablet, Take 81 mg by mouth daily. Swallow whole., Disp: , Rfl:  .  atorvastatin (LIPITOR) 10 MG tablet, Take 10 mg by mouth daily., Disp: , Rfl: 10 .  Blood Glucose Monitoring Suppl (ACCU-CHEK AVIVA PLUS) w/Device KIT, USE TO TEST YOUR BLOOD SUGAR THREE TIMES A DAY, Disp: , Rfl:  .  BREO ELLIPTA 100-25 MCG/INH AEPB, Inhale 1 puff into the lungs daily. , Disp: , Rfl: 5 .  cephALEXin (KEFLEX) 500 MG capsule, Take 1 capsule (500 mg total) by mouth 2 (two) times daily., Disp: 14 capsule, Rfl: 0 .  Colchicine 0.6 MG CAPS, , Disp: , Rfl:  .  CONTOUR NEXT TEST test strip, 1 each daily as needed., Disp: , Rfl: 5 .  fluticasone (FLONASE) 50 MCG/ACT nasal spray, Place 2 sprays into both nostrils daily., Disp: 16 g, Rfl: 0 .  glimepiride (AMARYL) 4 MG tablet, Take 4 mg by mouth daily with  breakfast., Disp: , Rfl:  .  LANTUS SOLOSTAR 100 UNIT/ML Solostar Pen, Inject 10 Units into the skin at bedtime. , Disp: , Rfl: 11 .  lisinopril-hydrochlorothiazide (PRINZIDE,ZESTORETIC) 10-12.5 MG tablet, Take 1 tablet by mouth daily. , Disp: , Rfl: 2 .  metFORMIN (GLUCOPHAGE) 1000 MG tablet, Take 1,000 mg by mouth 2 (two) times daily., Disp: , Rfl: 10 .  MICROLET LANCETS MISC, 1 each daily as needed., Disp: , Rfl: 5 .  Multiple Vitamin (MULTIVITAMIN WITH MINERALS) TABS tablet, Take 1 tablet by mouth daily., Disp: , Rfl:  .  pantoprazole (PROTONIX) 40 MG tablet, Take 40 mg by mouth daily. , Disp: , Rfl: 0 .  sildenafil (VIAGRA) 100 MG tablet, , Disp: , Rfl:  .  traMADol (ULTRAM) 50 MG tablet, , Disp: , Rfl:  .  Wound Dressings (MEDIHONEY WOUND/BURN DRESSING) GEL, Apply to affected are 3 times a week, and cover with sterile dressing., Disp: 1 Tube, Rfl: 5  Social History   Tobacco Use  Smoking Status Never Smoker  Smokeless Tobacco Never Used    Allergies  Allergen Reactions  . No Known Allergies    Objective:   There were no vitals filed for this visit. There is no height or weight on file to  calculate BMI. Constitutional Well developed. Well nourished.  Vascular Dorsalis pedis pulses faintly palpable . Posterior tibial pulses non-palpable . Capillary refill normal to all digits.  No cyanosis or clubbing noted. Pedal hair growth normal.  Neurologic Normal speech. Oriented to person, place, and time. Protective sensation absent  Dermatologic HPK plantar aspect of 2nd MPJ. HPK plantar right heel, no open ulcerations at either area. Right 1st MPJ with abrasion without open ulcer.   Orthopedic: No pain to palpation right foot. History of left BKA,   Radiographs: None today. Assessment:   1. Hallux abducto valgus, right   2. PAD (peripheral artery disease) ([place])   3. Diabetic ulcer of right midfoot associated with diabetes mellitus due to underlying condition, unspecified  ulcer stage ([place])    Plan:  Patient was evaluated and treated and all questions answered.  Healed ulcer right 1st MPJ, HPK without ulceration right 2nd met and heel. -Remains healed. New tube foam dispensed.  No follow-ups on file.

## 2019-04-25 ENCOUNTER — Other Ambulatory Visit: Payer: Self-pay

## 2019-04-25 ENCOUNTER — Telehealth: Payer: Self-pay | Admitting: Podiatry

## 2019-04-25 ENCOUNTER — Ambulatory Visit: Payer: Medicare Other | Admitting: Orthotics

## 2019-04-25 DIAGNOSIS — M2011 Hallux valgus (acquired), right foot: Secondary | ICD-10-CM

## 2019-04-25 DIAGNOSIS — I739 Peripheral vascular disease, unspecified: Secondary | ICD-10-CM

## 2019-04-25 DIAGNOSIS — E08621 Diabetes mellitus due to underlying condition with foot ulcer: Secondary | ICD-10-CM

## 2019-04-25 NOTE — Progress Notes (Signed)
Offloaded 2 of 3 DBS inserts to take pressure off previous ulcer.

## 2019-04-25 NOTE — Telephone Encounter (Signed)
Pt is asking for some toe sleeves to pick up today when he see's Rick. His appointment is scheduled for 3:00 pm.

## 2019-04-28 ENCOUNTER — Telehealth: Payer: Self-pay | Admitting: Podiatry

## 2019-04-28 NOTE — Telephone Encounter (Signed)
Patient called regarding toe protectors he was waiting on. Would like to know when they would be available for him to pick up at our [facility] location.

## 2019-05-30 NOTE — Telephone Encounter (Signed)
Left message informing pt the large tube foam is on back orders, to call again or check at [facility].

## 2019-05-30 NOTE — Telephone Encounter (Signed)
I believe we still don't have these in... they are backordered. Can we let him know?  He's looking at large tubefoam

## 2019-06-16 ENCOUNTER — Ambulatory Visit: Payer: Self-pay

## 2019-06-16 ENCOUNTER — Ambulatory Visit (INDEPENDENT_AMBULATORY_CARE_PROVIDER_SITE_OTHER): Payer: Medicare Other | Admitting: Surgery

## 2019-06-16 ENCOUNTER — Encounter: Payer: Self-pay | Admitting: Surgery

## 2019-06-16 ENCOUNTER — Other Ambulatory Visit: Payer: Self-pay

## 2019-06-16 DIAGNOSIS — Z89512 Acquired absence of left leg below knee: Secondary | ICD-10-CM

## 2019-06-16 DIAGNOSIS — T8789 Other complications of amputation stump: Secondary | ICD-10-CM

## 2019-06-16 DIAGNOSIS — L89899 Pressure ulcer of other site, unspecified stage: Secondary | ICD-10-CM

## 2019-06-16 NOTE — Progress Notes (Signed)
Office Visit Note   Patient: Troy [name]           Date of Birth: 01/18/1966           MRN: [id number] Visit Date: 06/16/2019              Requested by: Seward Carol, MD [address] [place],  [place] [address] PCP: Seward Carol, MD   Assessment & Plan: Visit Diagnoses:  1. History of left below knee amputation ([place])   2. Pressure ulcer of below knee amputation stump ([employer])     Plan: I spoke with Dr. [name] today regarding patient's issue.  States that oral antibiotic is not indicated.  He recommended me trimming the callus but I do not think that there was enough callus around the area that warranted this.  Patient states that he is going to see his prosthetic person tomorrow to make adjustments.  I recommend that he return back to see Dr. [name] next week to make sure that this area is looking better.  All questions answered.   Insthe isructions: Return in about 6 days (around 06/22/2019) for with dr yates per Jeneen Rinks.   Orders:  Orders Placed This Encounter  Procedures  . XR Knee 1-2 Views Left   No orders of the defined types were placed in this encounter.     Procedures: No procedures performed   Clinical Data: No additional findings.   Subjective: Chief Complaint  Patient presents with  . Wound Check    HPI 53 year old black male who is status post left BKA by Dr. [name] in 2017 returns.  States that since his surgery he has had a small callus at the distal stump.  A couple of days ago he began having some bleeding from a small open wound where the callus was.  Denies any purulent drainage.  No complaints of fever chills.  States that he is scheduled to see his prosthetic person tomorrow for adjustments.  Last had his stop refitted 2019. Review of Systems No current cardiac pulmonary GI GU issues  Objective: Vital Signs: There were no vitals taken for this visit.  Physical Exam HENT:     Head: Normocephalic and atraumatic.  Eyes:      Extraocular Movements: Extraocular movements intact.     Pupils: Pupils are equal, round, and reactive to light.  Musculoskeletal:     Comments: Patient has a pressure ulcer at the end of his stump.  This area is somewhat tender.  No gross signs of infection.  No purulent drainage.  Areas maybe a couple of millimeters deep.  No exposed bone.   Skin:    General: Skin is warm and dry.  Neurological:     General: No focal deficit present.     Mental Status: He is alert and oriented to person, place, and time.  Psychiatric:        Mood and Affect: Mood normal.     Ortho Exam  Specialty Comments:  No specialty comments available.  Imaging: No results found.   PMFS History: Patient Active Problem List   Diagnosis Date Noted  . Hyperlipidemia 09/07/2018  . Diabetic foot ulcer ([place]) 09/07/2018  . History of left below knee amputation ([place]) 03/26/2018  . Dysphagia 09/01/2017  . Special screening for malignant neoplasms, colon 09/01/2017  . Esophageal reflux 09/01/2017  . Acute osteomyelitis of left foot ([place])   . Diabetic foot ([place]) 02/02/2016  . Osteomyelitis ([place]) 02/02/2016  . Osteomyelitis of  foot, acute, left 02/02/2016  . CKD (chronic kidney disease), stage II 02/02/2016  . Anemia 02/02/2016  . Kidney stones 11/30/2013  . Cholelithiasis: PER CT ABD/PELVIS AND ABD [place] 11/30/13 11/30/2013  . Acute renal failure ([place]) 11/29/2013  . Acute pancreatitis 11/29/2013  . Asthma exacerbation 11/09/2013  . CAP (community acquired pneumonia) 07/18/2013  . Diabetes ([place]) 07/18/2013  . Hypertension    Past Medical History:  Diagnosis Date  . Asthma   . Bronchitis   . CKD (chronic kidney disease)   . Diabetes mellitus   . Diabetic Charcot foot ([place])    Left  . Gout   . Hypertension   . Kidney stones 11/30/2013  . Neuromuscular disorder ([place])   . Obesity   . Pancreatitis   . Polyneuropathy in diabetes [facility])     Family History  Problem Relation Age of Onset  . Diabetes Mellitus  II Father   . Diabetes Father   . Hypertension Father   . Diabetes Mother   . Hypertension Mother   . CAD Neg Hx     Past Surgical History:  Procedure Laterality Date  . AMPUTATION Left 02/06/2016   Procedure: AMPUTATION BELOW KNEE;  Surgeon: Marybelle Killings, MD;  Location: [facility];  Service: Orthopedics;  Laterality: Left;  . CHOLECYSTECTOMY N/A 12/03/2013   Procedure: LAPAROSCOPIC CHOLECYSTECTOMY WITH INTRAOPERATIVE CHOLANGIOGRAM;  Surgeon: Rolm Bookbinder, MD;  Location: [facility];  Service: General;  Laterality: N/A;  . COLONOSCOPY WITH PROPOFOL N/A 09/01/2017   Procedure: COLONOSCOPY WITH PROPOFOL;  Surgeon: Wilford Corner, MD;  [facility];  Service: Endoscopy;  Laterality: N/A;  . ESOPHAGOGASTRODUODENOSCOPY (EGD) WITH PROPOFOL N/A 09/01/2017   Procedure: ESOPHAGOGASTRODUODENOSCOPY (EGD) WITH PROPOFOL;  Surgeon: Wilford Corner, MD;  [facility];  Service: Endoscopy;  Laterality: N/A;  . NO PAST SURGERIES     Social History   Occupational History  . Not on file  Tobacco Use  . Smoking status: Never Smoker  . Smokeless tobacco: Never Used  Substance and Sexual Activity  . Alcohol use: Yes    Comment: occasionally  . Drug use: No  . Sexual activity: Not on file

## 2019-06-30 ENCOUNTER — Other Ambulatory Visit: Payer: Self-pay

## 2019-06-30 ENCOUNTER — Ambulatory Visit: Payer: Medicare Other | Admitting: Podiatry

## 2019-06-30 DIAGNOSIS — M2011 Hallux valgus (acquired), right foot: Secondary | ICD-10-CM

## 2019-06-30 DIAGNOSIS — E1151 Type 2 diabetes mellitus with diabetic peripheral angiopathy without gangrene: Secondary | ICD-10-CM

## 2019-06-30 DIAGNOSIS — E1169 Type 2 diabetes mellitus with other specified complication: Secondary | ICD-10-CM

## 2019-06-30 DIAGNOSIS — B351 Tinea unguium: Secondary | ICD-10-CM | POA: Diagnosis not present

## 2019-07-01 ENCOUNTER — Ambulatory Visit: Payer: Medicare Other | Admitting: Orthopaedic Surgery

## 2019-07-01 ENCOUNTER — Encounter: Payer: Self-pay | Admitting: Orthopaedic Surgery

## 2019-07-01 DIAGNOSIS — M79609 Pain in unspecified limb: Secondary | ICD-10-CM | POA: Diagnosis not present

## 2019-07-01 DIAGNOSIS — T8789 Other complications of amputation stump: Secondary | ICD-10-CM

## 2019-07-01 NOTE — Progress Notes (Signed)
Office Visit Note   Patient: Troy [name]           Date of Birth: [birth year]           MRN: [id number] Visit Date: 07/01/2019              Requested by: Seward Carol, MD [address] [place],  [place] [address] PCP: Seward Carol, MD   Assessment & Plan: Visit Diagnoses:  1. Painful amputation stump (HCC)     Plan: Prescription given for new stump liner.  Painful calluses debrided removing skin and thickening.  Small Band-Aid applied 1 mm point we had trace bloody drainage.  He will go to biotech for his new liner and will follow up here as needed we discussed stump care and he has a pumice stone that he can use to work on it 2 or 3 times a week until the callus is completely down to soft tissue.  He will return if he has problems.  Follow-Up Instructions: No follow-ups on file.   Orders:  No orders of the defined types were placed in this encounter.  No orders of the defined types were placed in this encounter.     Procedures: No procedures performed   Clinical Data: No additional findings.   Subjective: Chief Complaint  Patient presents with  . Left Knee - Follow-up    02/06/16 Left BKA    HPI 53 year old male returns post left BKA with callus painful thickening which is cracked.  He also has some problems with the opposite right foot with a callus which is getting treated by his podiatrist.  Patient seen by taking needs a prescription for a new liner.  He denies fever chills no drainage.  Patient is no longer working he was when his [employer] for 25+ years.  Review of Systems 14 point update unchanged from previous visit.  Objective: Vital Signs: BP (!) 172/94   Pulse 86   Ht 5\' 10"  (1.778 m)   Wt 300 lb (136.1 kg)   BMI 43.05 kg/m   Physical Exam Constitutional:      Appearance: He is well-developed.  HENT:     Head: Normocephalic and atraumatic.  Eyes:     Pupils: Pupils are equal, round, and reactive to light.  Neck:   Thyroid: No thyromegaly.     Trachea: No tracheal deviation.  Cardiovascular:     Rate and Rhythm: Normal rate.  Pulmonary:     Effort: Pulmonary effort is normal.     Breath sounds: No wheezing.  Abdominal:     General: Bowel sounds are normal.     Palpations: Abdomen is soft.  Skin:    General: Skin is warm and dry.     Capillary Refill: Capillary refill takes less than 2 seconds.  Neurological:     Mental Status: He is alert and oriented to person, place, and time.  Psychiatric:        Behavior: Behavior normal.        Thought Content: Thought content normal.        Judgment: Judgment normal.     Ortho Exam patient has 2.5 cm round hard callus buildup with some cracking of the tip of the stump overlying the tibia.  He may have some subcutaneous fluid underneath no drainage no cellulitis.  Specialty Comments:  No specialty comments available.  Imaging: No results found.   PMFS History: Patient Active Problem List   Diagnosis Date Noted  .  Painful amputation stump ([place]) 07/01/2019  . Hyperlipidemia 09/07/2018  . Diabetic foot ulcer ([place]) 09/07/2018  . History of left below knee amputation ([place]) 03/26/2018  . Dysphagia 09/01/2017  . Special screening for malignant neoplasms, colon 09/01/2017  . Esophageal reflux 09/01/2017  . Acute osteomyelitis of left foot ([place])   . Diabetic foot ([place]) 02/02/2016  . Osteomyelitis ([place]) 02/02/2016  . Osteomyelitis of foot, acute, left 02/02/2016  . CKD (chronic kidney disease), stage II 02/02/2016  . Anemia 02/02/2016  . Kidney stones 11/30/2013  . Cholelithiasis: PER CT ABD/PELVIS AND ABD [place] 11/30/13 11/30/2013  . Acute renal failure ([place]) 11/29/2013  . Acute pancreatitis 11/29/2013  . Asthma exacerbation 11/09/2013  . CAP (community acquired pneumonia) 07/18/2013  . Diabetes ([place]) 07/18/2013  . Hypertension    Past Medical History:  Diagnosis Date  . Asthma   . Bronchitis   . CKD (chronic kidney disease)   . Diabetes  mellitus   . Diabetic Charcot foot ([place])    Left  . Gout   . Hypertension   . Kidney stones 11/30/2013  . Neuromuscular disorder ([place])   . Obesity   . Pancreatitis   . Polyneuropathy in diabetes [facility])     Family History  Problem Relation Age of Onset  . Diabetes Mellitus II Father   . Diabetes Father   . Hypertension Father   . Diabetes Mother   . Hypertension Mother   . CAD Neg Hx     Past Surgical History:  Procedure Laterality Date  . AMPUTATION Left 02/06/2016   Procedure: AMPUTATION BELOW KNEE;  Surgeon: Marybelle Killings, MD;  Location: [facility];  Service: Orthopedics;  Laterality: Left;  . CHOLECYSTECTOMY N/A 12/03/2013   Procedure: LAPAROSCOPIC CHOLECYSTECTOMY WITH INTRAOPERATIVE CHOLANGIOGRAM;  Surgeon: Rolm Bookbinder, MD;  Location: [facility];  Service: General;  Laterality: N/A;  . COLONOSCOPY WITH PROPOFOL N/A 09/01/2017   Procedure: COLONOSCOPY WITH PROPOFOL;  Surgeon: Wilford Corner, MD;  [facility];  Service: Endoscopy;  Laterality: N/A;  . ESOPHAGOGASTRODUODENOSCOPY (EGD) WITH PROPOFOL N/A 09/01/2017   Procedure: ESOPHAGOGASTRODUODENOSCOPY (EGD) WITH PROPOFOL;  Surgeon: Wilford Corner, MD;  [facility];  Service: Endoscopy;  Laterality: N/A;  . NO PAST SURGERIES     Social History   Occupational History  . Not on file  Tobacco Use  . Smoking status: Never Smoker  . Smokeless tobacco: Never Used  Substance and Sexual Activity  . Alcohol use: Yes    Comment: occasionally  . Drug use: No  . Sexual activity: Not on file

## 2019-07-31 NOTE — Progress Notes (Signed)
Subjective:  Patient ID: Troy [name], male    DOB: 1965-08-14,  MRN: [id number]  Chief Complaint  Patient presents with  . Nail Problem    Nail trim right 1-5  . Callouses    Right plantar callous trim    54 y.o. male presents for wound care. History as above.  Requesting new to perform bunion shields for care of his bunion  Review of Systems: Negative except as noted in the HPI. Denies N/V/F/Ch.  Past Medical History:  Diagnosis Date  . Asthma   . Bronchitis   . CKD (chronic kidney disease)   . Diabetes mellitus   . Diabetic Charcot foot ([place])    Left  . Gout   . Hypertension   . Kidney stones 11/30/2013  . Neuromuscular disorder ([place])   . Obesity   . Pancreatitis   . Polyneuropathy in diabetes [facility])     Current Outpatient Medications:  .  albuterol (PROVENTIL HFA;VENTOLIN HFA) 108 (90 Base) MCG/ACT inhaler, Inhale 1-2 puffs into the lungs every 6 (six) hours as needed for wheezing or shortness of breath., Disp: 1 Inhaler, Rfl: 0 .  albuterol (PROVENTIL) (2.5 MG/3ML) 0.083% nebulizer solution, Take 2.5 mg by nebulization every 4 (four) hours as needed for wheezing. , Disp: , Rfl: 5 .  aspirin (ASPIRIN EC) 81 MG EC tablet, Take 81 mg by mouth daily. Swallow whole., Disp: , Rfl:  .  atorvastatin (LIPITOR) 10 MG tablet, Take 10 mg by mouth daily., Disp: , Rfl: 10 .  Blood Glucose Monitoring Suppl (ACCU-CHEK AVIVA PLUS) w/Device KIT, USE TO TEST YOUR BLOOD SUGAR THREE TIMES A DAY, Disp: , Rfl:  .  BREO ELLIPTA 100-25 MCG/INH AEPB, Inhale 1 puff into the lungs daily. , Disp: , Rfl: 5 .  cephALEXin (KEFLEX) 500 MG capsule, Take 1 capsule (500 mg total) by mouth 2 (two) times daily., Disp: 14 capsule, Rfl: 0 .  Colchicine 0.6 MG CAPS, , Disp: , Rfl:  .  CONTOUR NEXT TEST test strip, 1 each daily as needed., Disp: , Rfl: 5 .  fluticasone (FLONASE) 50 MCG/ACT nasal spray, Place 2 sprays into both nostrils daily., Disp: 16 g, Rfl: 0 .  glimepiride (AMARYL) 4 MG tablet, Take 4  mg by mouth daily with breakfast., Disp: , Rfl:  .  LANTUS SOLOSTAR 100 UNIT/ML Solostar Pen, Inject 10 Units into the skin at bedtime. , Disp: , Rfl: 11 .  lisinopril-hydrochlorothiazide (PRINZIDE,ZESTORETIC) 10-12.5 MG tablet, Take 1 tablet by mouth daily. , Disp: , Rfl: 2 .  metFORMIN (GLUCOPHAGE) 1000 MG tablet, Take 1,000 mg by mouth 2 (two) times daily., Disp: , Rfl: 10 .  MICROLET LANCETS MISC, 1 each daily as needed., Disp: , Rfl: 5 .  Multiple Vitamin (MULTIVITAMIN WITH MINERALS) TABS tablet, Take 1 tablet by mouth daily., Disp: , Rfl:  .  oxyCODONE-acetaminophen (PERCOCET/ROXICET) 5-325 MG tablet, Take 1 tablet by mouth every 4 (four) hours as needed., Disp: , Rfl:  .  pantoprazole (PROTONIX) 40 MG tablet, Take 40 mg by mouth daily. , Disp: , Rfl: 0 .  sildenafil (VIAGRA) 100 MG tablet, , Disp: , Rfl:  .  traMADol (ULTRAM) 50 MG tablet, , Disp: , Rfl:  .  Wound Dressings (MEDIHONEY WOUND/BURN DRESSING) GEL, Apply to affected are 3 times a week, and cover with sterile dressing., Disp: 1 Tube, Rfl: 5  Social History   Tobacco Use  Smoking Status Never Smoker  Smokeless Tobacco Never Used    Allergies  Allergen Reactions  .  No Known Allergies    Objective:   There were no vitals filed for this visit. There is no height or weight on file to calculate BMI. Constitutional Well developed. Well nourished.  Vascular Dorsalis pedis pulses faintly palpable . Posterior tibial pulses non-palpable . Capillary refill normal to all digits.  No cyanosis or clubbing noted. Pedal hair growth normal.  Neurologic Normal speech. Oriented to person, place, and time. Protective sensation absent  Dermatologic  no open ulceration but preulcerative hyperkeratotic areas of the plantar right first MPJ and dorsal right first MPJ.  Nails elongated thickened dystrophic  Orthopedic: No pain to palpation right foot. History of left BKA,   Radiographs: None today. Assessment:   1. Hallux abducto  valgus, right   2. Onychomycosis of multiple toenails with type 2 diabetes mellitus and peripheral angiopathy ([place])    Plan:  Patient was evaluated and treated and all questions answered.  Healed ulcer right 1st MPJ, HPK without ulceration right 2nd met and heel. -Remains healed, 2 from bunion shields dispensed  DM with PAD, onychomycosis -Nails debrided x5   No follow-ups on file.

## 2019-08-08 DIAGNOSIS — E1165 Type 2 diabetes mellitus with hyperglycemia: Secondary | ICD-10-CM | POA: Diagnosis not present

## 2019-08-08 DIAGNOSIS — Z1389 Encounter for screening for other disorder: Secondary | ICD-10-CM | POA: Diagnosis not present

## 2019-08-08 DIAGNOSIS — I10 Essential (primary) hypertension: Secondary | ICD-10-CM | POA: Diagnosis not present

## 2019-08-08 DIAGNOSIS — E11621 Type 2 diabetes mellitus with foot ulcer: Secondary | ICD-10-CM | POA: Diagnosis not present

## 2019-08-08 DIAGNOSIS — E1151 Type 2 diabetes mellitus with diabetic peripheral angiopathy without gangrene: Secondary | ICD-10-CM | POA: Diagnosis not present

## 2019-08-08 DIAGNOSIS — Z0000 Encounter for general adult medical examination without abnormal findings: Secondary | ICD-10-CM | POA: Diagnosis not present

## 2019-08-08 DIAGNOSIS — Z125 Encounter for screening for malignant neoplasm of prostate: Secondary | ICD-10-CM | POA: Diagnosis not present

## 2019-08-08 DIAGNOSIS — E7800 Pure hypercholesterolemia, unspecified: Secondary | ICD-10-CM | POA: Diagnosis not present

## 2019-08-17 DIAGNOSIS — E113511 Type 2 diabetes mellitus with proliferative diabetic retinopathy with macular edema, right eye: Secondary | ICD-10-CM | POA: Diagnosis not present

## 2019-08-17 DIAGNOSIS — H35371 Puckering of macula, right eye: Secondary | ICD-10-CM | POA: Diagnosis not present

## 2019-08-17 DIAGNOSIS — H2511 Age-related nuclear cataract, right eye: Secondary | ICD-10-CM | POA: Diagnosis not present

## 2019-08-17 DIAGNOSIS — H4311 Vitreous hemorrhage, right eye: Secondary | ICD-10-CM | POA: Diagnosis not present

## 2019-08-17 DIAGNOSIS — H2513 Age-related nuclear cataract, bilateral: Secondary | ICD-10-CM | POA: Diagnosis not present

## 2019-08-17 DIAGNOSIS — H4313 Vitreous hemorrhage, bilateral: Secondary | ICD-10-CM | POA: Diagnosis not present

## 2019-08-17 DIAGNOSIS — H3343 Traction detachment of retina, bilateral: Secondary | ICD-10-CM | POA: Diagnosis not present

## 2019-08-17 DIAGNOSIS — E113513 Type 2 diabetes mellitus with proliferative diabetic retinopathy with macular edema, bilateral: Secondary | ICD-10-CM | POA: Diagnosis not present

## 2019-08-17 DIAGNOSIS — H3582 Retinal ischemia: Secondary | ICD-10-CM | POA: Diagnosis not present

## 2019-08-18 DIAGNOSIS — E113522 Type 2 diabetes mellitus with proliferative diabetic retinopathy with traction retinal detachment involving the macula, left eye: Secondary | ICD-10-CM | POA: Diagnosis not present

## 2019-08-18 DIAGNOSIS — H2511 Age-related nuclear cataract, right eye: Secondary | ICD-10-CM | POA: Diagnosis not present

## 2019-08-18 DIAGNOSIS — H3581 Retinal edema: Secondary | ICD-10-CM | POA: Diagnosis not present

## 2019-08-18 DIAGNOSIS — H4312 Vitreous hemorrhage, left eye: Secondary | ICD-10-CM | POA: Diagnosis not present

## 2019-08-18 DIAGNOSIS — E113511 Type 2 diabetes mellitus with proliferative diabetic retinopathy with macular edema, right eye: Secondary | ICD-10-CM | POA: Diagnosis not present

## 2019-08-18 DIAGNOSIS — H25811 Combined forms of age-related cataract, right eye: Secondary | ICD-10-CM | POA: Diagnosis not present

## 2019-08-18 DIAGNOSIS — H268 Other specified cataract: Secondary | ICD-10-CM | POA: Diagnosis not present

## 2019-08-18 DIAGNOSIS — H35371 Puckering of macula, right eye: Secondary | ICD-10-CM | POA: Diagnosis not present

## 2019-08-18 DIAGNOSIS — E113521 Type 2 diabetes mellitus with proliferative diabetic retinopathy with traction retinal detachment involving the macula, right eye: Secondary | ICD-10-CM | POA: Diagnosis not present

## 2019-08-18 DIAGNOSIS — H4311 Vitreous hemorrhage, right eye: Secondary | ICD-10-CM | POA: Diagnosis not present

## 2019-08-19 DIAGNOSIS — H4311 Vitreous hemorrhage, right eye: Secondary | ICD-10-CM | POA: Diagnosis not present

## 2019-08-19 DIAGNOSIS — E113511 Type 2 diabetes mellitus with proliferative diabetic retinopathy with macular edema, right eye: Secondary | ICD-10-CM | POA: Diagnosis not present

## 2019-08-26 DIAGNOSIS — H35373 Puckering of macula, bilateral: Secondary | ICD-10-CM | POA: Diagnosis not present

## 2019-08-26 DIAGNOSIS — E113522 Type 2 diabetes mellitus with proliferative diabetic retinopathy with traction retinal detachment involving the macula, left eye: Secondary | ICD-10-CM | POA: Diagnosis not present

## 2019-08-26 DIAGNOSIS — H4312 Vitreous hemorrhage, left eye: Secondary | ICD-10-CM | POA: Diagnosis not present

## 2019-08-30 DIAGNOSIS — H2512 Age-related nuclear cataract, left eye: Secondary | ICD-10-CM | POA: Diagnosis not present

## 2019-09-01 ENCOUNTER — Ambulatory Visit: Payer: Medicare Other | Admitting: Podiatry

## 2019-09-01 DIAGNOSIS — H2512 Age-related nuclear cataract, left eye: Secondary | ICD-10-CM | POA: Diagnosis not present

## 2019-09-01 DIAGNOSIS — E113522 Type 2 diabetes mellitus with proliferative diabetic retinopathy with traction retinal detachment involving the macula, left eye: Secondary | ICD-10-CM | POA: Diagnosis not present

## 2019-09-01 DIAGNOSIS — H4312 Vitreous hemorrhage, left eye: Secondary | ICD-10-CM | POA: Diagnosis not present

## 2019-09-07 DIAGNOSIS — E113591 Type 2 diabetes mellitus with proliferative diabetic retinopathy without macular edema, right eye: Secondary | ICD-10-CM | POA: Diagnosis not present

## 2019-09-07 DIAGNOSIS — E113522 Type 2 diabetes mellitus with proliferative diabetic retinopathy with traction retinal detachment involving the macula, left eye: Secondary | ICD-10-CM | POA: Diagnosis not present

## 2019-09-07 DIAGNOSIS — S0501XA Injury of conjunctiva and corneal abrasion without foreign body, right eye, initial encounter: Secondary | ICD-10-CM | POA: Diagnosis not present

## 2019-09-15 ENCOUNTER — Ambulatory Visit: Payer: Medicare HMO | Admitting: Podiatry

## 2019-09-19 DIAGNOSIS — M109 Gout, unspecified: Secondary | ICD-10-CM | POA: Diagnosis not present

## 2019-09-19 DIAGNOSIS — E119 Type 2 diabetes mellitus without complications: Secondary | ICD-10-CM | POA: Diagnosis not present

## 2019-09-19 DIAGNOSIS — M064 Inflammatory polyarthropathy: Secondary | ICD-10-CM | POA: Diagnosis not present

## 2019-09-19 DIAGNOSIS — M1990 Unspecified osteoarthritis, unspecified site: Secondary | ICD-10-CM | POA: Diagnosis not present

## 2019-09-19 DIAGNOSIS — J45909 Unspecified asthma, uncomplicated: Secondary | ICD-10-CM | POA: Diagnosis not present

## 2019-09-19 DIAGNOSIS — M79641 Pain in right hand: Secondary | ICD-10-CM | POA: Diagnosis not present

## 2019-09-19 DIAGNOSIS — M79671 Pain in right foot: Secondary | ICD-10-CM | POA: Diagnosis not present

## 2019-09-19 DIAGNOSIS — M79642 Pain in left hand: Secondary | ICD-10-CM | POA: Diagnosis not present

## 2019-09-19 DIAGNOSIS — M25571 Pain in right ankle and joints of right foot: Secondary | ICD-10-CM | POA: Diagnosis not present

## 2019-09-21 DIAGNOSIS — E113522 Type 2 diabetes mellitus with proliferative diabetic retinopathy with traction retinal detachment involving the macula, left eye: Secondary | ICD-10-CM | POA: Diagnosis not present

## 2019-09-21 DIAGNOSIS — H35371 Puckering of macula, right eye: Secondary | ICD-10-CM | POA: Diagnosis not present

## 2019-09-23 ENCOUNTER — Other Ambulatory Visit (HOSPITAL_COMMUNITY): Payer: Self-pay | Admitting: Ophthalmology

## 2019-09-28 DIAGNOSIS — R6889 Other general symptoms and signs: Secondary | ICD-10-CM | POA: Diagnosis not present

## 2019-09-29 ENCOUNTER — Other Ambulatory Visit: Payer: Self-pay

## 2019-09-29 ENCOUNTER — Ambulatory Visit: Payer: Medicare HMO | Admitting: Podiatry

## 2019-09-29 DIAGNOSIS — B351 Tinea unguium: Secondary | ICD-10-CM

## 2019-09-29 DIAGNOSIS — E1151 Type 2 diabetes mellitus with diabetic peripheral angiopathy without gangrene: Secondary | ICD-10-CM | POA: Diagnosis not present

## 2019-09-29 DIAGNOSIS — E1169 Type 2 diabetes mellitus with other specified complication: Secondary | ICD-10-CM | POA: Diagnosis not present

## 2019-09-29 DIAGNOSIS — E1142 Type 2 diabetes mellitus with diabetic polyneuropathy: Secondary | ICD-10-CM

## 2019-09-29 DIAGNOSIS — L84 Corns and callosities: Secondary | ICD-10-CM

## 2019-09-29 DIAGNOSIS — R6889 Other general symptoms and signs: Secondary | ICD-10-CM | POA: Diagnosis not present

## 2019-09-29 NOTE — Progress Notes (Signed)
  Subjective:  Patient ID: Troy [name], male    DOB: 10-14-1965,  MRN: [id number]  Chief Complaint  Patient presents with  . Callouses    Right plantar callous trim  . Nail Problem    Right 1-5 nail trim   54 y.o. male presents with the above complaint. History confirmed with patient.   Objective:  Physical Exam: warm, good capillary refill, nail exam onychomycosis of the toenails, DP pulses palpable, PT pulses palpable and protective sensation absent Left Foot: Hx BKA  Right Foot: HPK submet 2   No images are attached to the encounter.  Assessment:   1. Onychomycosis of multiple toenails with type 2 diabetes mellitus and peripheral angiopathy ([place])   2. DM type 2 with diabetic peripheral neuropathy (H[facility])   3. Callus    Plan:  Patient was evaluated and treated and all questions answered.  Onychomycosis, Diabetes and DPN -Patient is diabetic with a qualifying condition for at risk foot care.  Procedure: Nail Debridement Rationale: Patient meets criteria for routine foot care due to amputation hx Type of Debridement: manual, sharp debridement. Instrumentation: Nail nipper, rotary burr. Number of Nails: 5  Procedure: Paring of Lesion Rationale: painful hyperkeratotic lesion Type of Debridement: manual, sharp debridement. Instrumentation: 312 blade Number of Lesions: 1  No follow-ups on file.

## 2019-10-10 DIAGNOSIS — J45909 Unspecified asthma, uncomplicated: Secondary | ICD-10-CM | POA: Diagnosis not present

## 2019-10-10 DIAGNOSIS — M109 Gout, unspecified: Secondary | ICD-10-CM | POA: Diagnosis not present

## 2019-10-10 DIAGNOSIS — M1990 Unspecified osteoarthritis, unspecified site: Secondary | ICD-10-CM | POA: Diagnosis not present

## 2019-10-10 DIAGNOSIS — M064 Inflammatory polyarthropathy: Secondary | ICD-10-CM | POA: Diagnosis not present

## 2019-10-10 DIAGNOSIS — E119 Type 2 diabetes mellitus without complications: Secondary | ICD-10-CM | POA: Diagnosis not present

## 2019-10-12 DIAGNOSIS — E113522 Type 2 diabetes mellitus with proliferative diabetic retinopathy with traction retinal detachment involving the macula, left eye: Secondary | ICD-10-CM | POA: Diagnosis not present

## 2019-10-12 DIAGNOSIS — Z961 Presence of intraocular lens: Secondary | ICD-10-CM | POA: Diagnosis not present

## 2019-10-12 DIAGNOSIS — E113591 Type 2 diabetes mellitus with proliferative diabetic retinopathy without macular edema, right eye: Secondary | ICD-10-CM | POA: Diagnosis not present

## 2019-10-18 DIAGNOSIS — G4733 Obstructive sleep apnea (adult) (pediatric): Secondary | ICD-10-CM | POA: Diagnosis not present

## 2019-10-19 DIAGNOSIS — Z20828 Contact with and (suspected) exposure to other viral communicable diseases: Secondary | ICD-10-CM | POA: Diagnosis not present

## 2019-10-24 ENCOUNTER — Ambulatory Visit: Payer: Medicare HMO | Admitting: Orthotics

## 2019-10-24 ENCOUNTER — Other Ambulatory Visit: Payer: Self-pay

## 2019-10-24 DIAGNOSIS — E1142 Type 2 diabetes mellitus with diabetic polyneuropathy: Secondary | ICD-10-CM

## 2019-10-24 DIAGNOSIS — L84 Corns and callosities: Secondary | ICD-10-CM

## 2019-10-24 DIAGNOSIS — I739 Peripheral vascular disease, unspecified: Secondary | ICD-10-CM

## 2019-10-24 DIAGNOSIS — E1169 Type 2 diabetes mellitus with other specified complication: Secondary | ICD-10-CM

## 2019-10-24 DIAGNOSIS — E08621 Diabetes mellitus due to underlying condition with foot ulcer: Secondary | ICD-10-CM

## 2019-10-24 DIAGNOSIS — B351 Tinea unguium: Secondary | ICD-10-CM

## 2019-10-24 DIAGNOSIS — L97419 Non-pressure chronic ulcer of right heel and midfoot with unspecified severity: Secondary | ICD-10-CM

## 2019-10-24 NOTE — Progress Notes (Signed)

## 2019-10-25 DIAGNOSIS — I10 Essential (primary) hypertension: Secondary | ICD-10-CM | POA: Diagnosis not present

## 2019-10-25 DIAGNOSIS — J45909 Unspecified asthma, uncomplicated: Secondary | ICD-10-CM | POA: Diagnosis not present

## 2019-10-25 DIAGNOSIS — E1151 Type 2 diabetes mellitus with diabetic peripheral angiopathy without gangrene: Secondary | ICD-10-CM | POA: Diagnosis not present

## 2019-10-25 DIAGNOSIS — E1140 Type 2 diabetes mellitus with diabetic neuropathy, unspecified: Secondary | ICD-10-CM | POA: Diagnosis not present

## 2019-10-25 DIAGNOSIS — E11621 Type 2 diabetes mellitus with foot ulcer: Secondary | ICD-10-CM | POA: Diagnosis not present

## 2019-10-25 DIAGNOSIS — N182 Chronic kidney disease, stage 2 (mild): Secondary | ICD-10-CM | POA: Diagnosis not present

## 2019-10-25 DIAGNOSIS — J45901 Unspecified asthma with (acute) exacerbation: Secondary | ICD-10-CM | POA: Diagnosis not present

## 2019-10-25 DIAGNOSIS — E782 Mixed hyperlipidemia: Secondary | ICD-10-CM | POA: Diagnosis not present

## 2019-10-25 DIAGNOSIS — F3341 Major depressive disorder, recurrent, in partial remission: Secondary | ICD-10-CM | POA: Diagnosis not present

## 2019-11-23 ENCOUNTER — Telehealth: Payer: Self-pay | Admitting: *Deleted

## 2019-11-23 NOTE — Telephone Encounter (Signed)
Pt called and asked if I had gotten the photo of his foot.

## 2019-11-23 NOTE — Telephone Encounter (Signed)
I called pt and told him I did not see the photo in my email, and asked him to describe his situation. Pt states the ulcer feels bigger. I told pt that regardless of my getting the photo he would need an appt and I transferred to scheduler.

## 2019-11-24 ENCOUNTER — Other Ambulatory Visit: Payer: Self-pay

## 2019-11-24 ENCOUNTER — Ambulatory Visit: Payer: Medicare HMO | Admitting: Podiatry

## 2019-11-24 DIAGNOSIS — M21961 Unspecified acquired deformity of right lower leg: Secondary | ICD-10-CM

## 2019-12-14 DIAGNOSIS — E113522 Type 2 diabetes mellitus with proliferative diabetic retinopathy with traction retinal detachment involving the macula, left eye: Secondary | ICD-10-CM | POA: Diagnosis not present

## 2019-12-14 DIAGNOSIS — T85398D Other mechanical complication of other ocular prosthetic devices, implants and grafts, subsequent encounter: Secondary | ICD-10-CM | POA: Diagnosis not present

## 2019-12-14 DIAGNOSIS — H4052X3 Glaucoma secondary to other eye disorders, left eye, severe stage: Secondary | ICD-10-CM | POA: Diagnosis not present

## 2019-12-14 DIAGNOSIS — E113591 Type 2 diabetes mellitus with proliferative diabetic retinopathy without macular edema, right eye: Secondary | ICD-10-CM | POA: Diagnosis not present

## 2019-12-15 DIAGNOSIS — H44752 Retained (nonmagnetic) (old) foreign body in vitreous body, left eye: Secondary | ICD-10-CM | POA: Diagnosis not present

## 2019-12-15 DIAGNOSIS — H4052X3 Glaucoma secondary to other eye disorders, left eye, severe stage: Secondary | ICD-10-CM | POA: Diagnosis not present

## 2019-12-15 DIAGNOSIS — E113522 Type 2 diabetes mellitus with proliferative diabetic retinopathy with traction retinal detachment involving the macula, left eye: Secondary | ICD-10-CM | POA: Diagnosis not present

## 2019-12-15 DIAGNOSIS — T85398A Other mechanical complication of other ocular prosthetic devices, implants and grafts, initial encounter: Secondary | ICD-10-CM | POA: Diagnosis not present

## 2019-12-16 DIAGNOSIS — E113522 Type 2 diabetes mellitus with proliferative diabetic retinopathy with traction retinal detachment involving the macula, left eye: Secondary | ICD-10-CM | POA: Diagnosis not present

## 2019-12-21 ENCOUNTER — Other Ambulatory Visit (HOSPITAL_COMMUNITY): Payer: Self-pay | Admitting: Ophthalmology

## 2019-12-21 DIAGNOSIS — E113522 Type 2 diabetes mellitus with proliferative diabetic retinopathy with traction retinal detachment involving the macula, left eye: Secondary | ICD-10-CM | POA: Diagnosis not present

## 2019-12-30 ENCOUNTER — Other Ambulatory Visit: Payer: Self-pay

## 2019-12-30 ENCOUNTER — Ambulatory Visit: Payer: Medicare HMO | Admitting: Podiatry

## 2019-12-30 DIAGNOSIS — E1151 Type 2 diabetes mellitus with diabetic peripheral angiopathy without gangrene: Secondary | ICD-10-CM | POA: Diagnosis not present

## 2019-12-30 DIAGNOSIS — M21961 Unspecified acquired deformity of right lower leg: Secondary | ICD-10-CM | POA: Diagnosis not present

## 2019-12-30 DIAGNOSIS — R6889 Other general symptoms and signs: Secondary | ICD-10-CM | POA: Diagnosis not present

## 2019-12-30 DIAGNOSIS — E1169 Type 2 diabetes mellitus with other specified complication: Secondary | ICD-10-CM | POA: Diagnosis not present

## 2019-12-30 DIAGNOSIS — B351 Tinea unguium: Secondary | ICD-10-CM | POA: Diagnosis not present

## 2019-12-30 NOTE — Progress Notes (Signed)
  Subjective:  Patient ID: Troy [name], male    DOB: 1966-03-05,  MRN: [id number]  Chief Complaint  Patient presents with  . Callouses    Right plantar callous. Pt is concerned "It looks different". Denies fever/chills/nausea/vomiting.   54 y.o. male presents with the above complaint. History confirmed with patient.   Objective:  Physical Exam: warm, good capillary refill, nail exam onychomycosis of the toenails, DP pulses palpable, PT pulses palpable and protective sensation absent Left Foot: Hx BKA  Right Foot: HPK submet 2   No images are attached to the encounter.  Assessment:   1. Deformity of metatarsal bone of right foot    Plan:  Patient was evaluated and treated and all questions answered.  Onychomycosis, Diabetes and DPN -Callus debrided, pre-ulcerative. No open ulcer. Offloaded. F/u in 1 month   Return in about 1 month (around 12/24/2019).

## 2019-12-31 ENCOUNTER — Other Ambulatory Visit: Payer: Self-pay

## 2019-12-31 ENCOUNTER — Ambulatory Visit (HOSPITAL_COMMUNITY)
Admission: EM | Admit: 2019-12-31 | Discharge: 2019-12-31 | Disposition: A | Discharge status: Home / Self Care | Payer: Medicare HMO | Attending: Family Medicine | Admitting: Family Medicine

## 2019-12-31 ENCOUNTER — Ambulatory Visit (INDEPENDENT_AMBULATORY_CARE_PROVIDER_SITE_OTHER): Payer: Medicare HMO

## 2019-12-31 ENCOUNTER — Encounter (HOSPITAL_COMMUNITY): Payer: Self-pay

## 2019-12-31 DIAGNOSIS — R1011 Right upper quadrant pain: Secondary | ICD-10-CM

## 2019-12-31 DIAGNOSIS — R1013 Epigastric pain: Secondary | ICD-10-CM

## 2019-12-31 DIAGNOSIS — R110 Nausea: Secondary | ICD-10-CM | POA: Diagnosis not present

## 2019-12-31 DIAGNOSIS — R197 Diarrhea, unspecified: Secondary | ICD-10-CM

## 2019-12-31 DIAGNOSIS — Z89512 Acquired absence of left leg below knee: Secondary | ICD-10-CM | POA: Diagnosis not present

## 2019-12-31 LAB — CBG MONITORING, ED: Glucose-Capillary: 210 mg/dL — ABNORMAL HIGH (ref 70–99)

## 2019-12-31 MED ORDER — SUCRALFATE 1 GM/10ML PO SUSP
1.0000 g | Freq: Three times a day (TID) | ORAL | 0 refills | Status: DC
Start: 2019-12-31 — End: 2020-09-25

## 2019-12-31 NOTE — ED Provider Notes (Signed)
[place]    CSN: [id number] Arrival date & time: 12/31/19  1010      History   Chief Complaint Chief Complaint  Patient presents with  . Abdominal Pain    HPI JOSHAUA EPPLE is a 54 y.o. male.   HPI  Abdominal Pain: Patient complains of abdominal pain. The pain is described as cramping and worsens with intake of food. Pain is located in the RUQ, epigastric pain  without radiation. Onset was 1 weeks ago. Patient has history of gallbladder removal and fatty liver disease. Alleviating factors: avoiding food intake. Associated symptoms: none. The patient denies constipation, diarrhea, fever, headache, nausea and vomiting.    Past Medical History:  Diagnosis Date  . Asthma   . Bronchitis   . CKD (chronic kidney disease)   . Diabetes mellitus   . Diabetic Charcot foot ([place])    Left  . Gout   . Hypertension   . Kidney stones 11/30/2013  . Neuromuscular disorder ([place])   . Obesity   . Pancreatitis   . Polyneuropathy in diabetes [facility])     Patient Active Problem List   Diagnosis Date Noted  . Painful amputation stump ([place]) 07/01/2019  . Hyperlipidemia 09/07/2018  . Diabetic foot ulcer ([place]) 09/07/2018  . History of left below knee amputation ([place]) 03/26/2018  . Dysphagia 09/01/2017  . Special screening for malignant neoplasms, colon 09/01/2017  . Esophageal reflux 09/01/2017  . Acute osteomyelitis of left foot ([place])   . Diabetic foot ([place]) 02/02/2016  . Osteomyelitis ([place]) 02/02/2016  . Osteomyelitis of foot, acute, left 02/02/2016  . CKD (chronic kidney disease), stage II 02/02/2016  . Anemia 02/02/2016  . Kidney stones 11/30/2013  . Cholelithiasis: PER CT ABD/PELVIS AND ABD [place] 11/30/13 11/30/2013  . Acute renal failure ([place]) 11/29/2013  . Acute pancreatitis 11/29/2013  . Asthma exacerbation 11/09/2013  . CAP (community acquired pneumonia) 07/18/2013  . Diabetes ([place]) 07/18/2013  . Hypertension     Past Surgical History:  Procedure Laterality Date   . AMPUTATION Left 02/06/2016   Procedure: AMPUTATION BELOW KNEE;  Surgeon: Marybelle Killings, MD;  Location: [place];  Service: Orthopedics;  Laterality: Left;  . CHOLECYSTECTOMY N/A 12/03/2013   Procedure: LAPAROSCOPIC CHOLECYSTECTOMY WITH INTRAOPERATIVE CHOLANGIOGRAM;  Surgeon: Rolm Bookbinder, MD;  Location: [place];  Service: General;  Laterality: N/A;  . COLONOSCOPY WITH PROPOFOL N/A 09/01/2017   Procedure: COLONOSCOPY WITH PROPOFOL;  Surgeon: Wilford Corner, MD;  Location: [facility];  Service: Endoscopy;  Laterality: N/A;  . ESOPHAGOGASTRODUODENOSCOPY (EGD) WITH PROPOFOL N/A 09/01/2017   Procedure: ESOPHAGOGASTRODUODENOSCOPY (EGD) WITH PROPOFOL;  Surgeon: Wilford Corner, MD;  Location: [facility];  Service: Endoscopy;  Laterality: N/A;  . NO PAST SURGERIES         Home Medications    Prior to Admission medications   Medication Sig Start Date End Date Taking? Authorizing Provider  ADVAIR DISKUS 250-50 MCG/DOSE AEPB  08/09/19   [provider]  albuterol (PROVENTIL HFA;VENTOLIN HFA) 108 (90 Base) MCG/ACT inhaler Inhale 1-2 puffs into the lungs every 6 (six) hours as needed for wheezing or shortness of breath. 08/25/15   Hyman Bible, PA-C  albuterol (PROVENTIL) (2.5 MG/3ML) 0.083% nebulizer solution Take 2.5 mg by nebulization every 4 (four) hours as needed for wheezing.  06/11/16   [provider]  Alcohol Swabs (B-D SINGLE USE SWABS REGULAR) PADS  07/29/19   [provider]  allopurinol (ZYLOPRIM) 100 MG tablet  09/21/19   [provider]  allopurinol (ZYLOPRIM) 300  MG tablet  10/05/19   [provider]  aspirin (ASPIRIN EC) 81 MG EC tablet Take 81 mg by mouth daily. Swallow whole.    [provider]  atorvastatin (LIPITOR) 10 MG tablet Take 10 mg by mouth daily. 03/06/17   [provider]  Blood Glucose Calibration (ACCU-CHEK AVIVA) SOLN  07/29/19   [provider]  Blood Glucose Monitoring Suppl (ACCU-CHEK AVIVA  PLUS) w/Device KIT USE TO TEST YOUR BLOOD SUGAR THREE TIMES A DAY 01/12/19   [provider]  BREO ELLIPTA 100-25 MCG/INH AEPB Inhale 1 puff into the lungs daily.  07/31/15   [provider]  cephALEXin (KEFLEX) 500 MG capsule Take 1 capsule (500 mg total) by mouth 2 (two) times daily. 02/01/19   Evelina Bucy, DPM  Colchicine 0.6 MG CAPS  02/07/19   [provider]  CONTOUR NEXT TEST test strip 1 each daily as needed. 03/24/17   [provider]  dorzolamide-timolol (COSOPT) 22.3-6.8 MG/ML ophthalmic solution  08/19/19   [provider]  DROPLET PEN NEEDLES 31G X 6 MM [place]  07/29/19   [provider]  DUREZOL 0.05 % EMUL  12/14/19   [provider]  fluticasone (FLONASE) 50 MCG/ACT nasal spray Place 2 sprays into both nostrils daily. 65/03/54   Delora Fuel, MD  glimepiride (AMARYL) 4 MG tablet Take 4 mg by mouth daily with breakfast.    [provider]  LANTUS SOLOSTAR 100 UNIT/ML Solostar Pen Inject 10 Units into the skin at bedtime.  01/10/16   [provider]  lisinopril (ZESTRIL) 20 MG tablet  08/17/19   [provider]  lisinopril-hydrochlorothiazide (PRINZIDE,ZESTORETIC) 10-12.5 MG tablet Take 1 tablet by mouth daily.  05/06/16   [provider]  metFORMIN (GLUCOPHAGE) 1000 MG tablet Take 1,000 mg by mouth 2 (two) times daily. 03/16/17   [provider]  MICROLET LANCETS MISC 1 each daily as needed. 02/16/17   [provider]  Multiple Vitamin (MULTIVITAMIN WITH MINERALS) TABS tablet Take 1 tablet by mouth daily.    [provider]  ofloxacin (OCUFLOX) 0.3 % ophthalmic solution  12/14/19   [provider]  oxyCODONE-acetaminophen (PERCOCET/ROXICET) 5-325 MG tablet Take 1 tablet by mouth every 4 (four) hours as needed. 06/22/19   [provider]  pantoprazole (PROTONIX) 40 MG tablet Take 40 mg by mouth daily.  06/11/16   [provider]  prednisoLONE  acetate (PRED FORTE) 1 % ophthalmic suspension  09/21/19   [provider]  sildenafil (VIAGRA) 100 MG tablet  01/04/19   [provider]  traMADol (ULTRAM) 50 MG tablet  12/02/18   [provider]  Wound Dressings (MEDIHONEY WOUND/BURN DRESSING) GEL Apply to affected are 3 times a week, and cover with sterile dressing. 08/26/18   Evelina Bucy, DPM    Family History Family History  Problem Relation Age of Onset  . Diabetes Mellitus II Father   . Diabetes Father   . Hypertension Father   . Diabetes Mother   . Hypertension Mother   . CAD Neg Hx     Social History Social History   Tobacco Use  . Smoking status: Never Smoker  . Smokeless tobacco: Never Used  Substance Use Topics  . Alcohol use: Yes    Comment: occasionally  . Drug use: No     Allergies   Patient has no known allergies.   Review of Systems Review of Systems Pertinent negatives listed in HPI  Physical Exam Triage Vital  Signs ED Triage Vitals  Enc Vitals Group     BP 12/31/19 1059 140/90     Pulse Rate 12/31/19 1059 98     Resp 12/31/19 1059 20     Temp 12/31/19 1059 98.6 F (37 C)     Temp Source 12/31/19 1059 Oral     SpO2 12/31/19 1059 99 %     Weight --      Height --      Head Circumference --      Peak Flow --      Pain Score 12/31/19 1100 10     Pain Loc --      Pain Edu? --      Excl. in [place]? --    No data found.  Updated Vital Signs BP 140/90 (BP Location: Right Arm)   Pulse 98   Temp 98.6 F (37 C) (Oral)   Resp 20   SpO2 99%   Visual Acuity Right Eye Distance:   Left Eye Distance:   Bilateral Distance:    Right Eye Near:   Left Eye Near:    Bilateral Near:     Physical Exam Constitutional:      Appearance: He is well-developed.  HENT:     Head: Normocephalic.  Cardiovascular:     Rate and Rhythm: Normal rate and regular rhythm.  Pulmonary:     Effort: Pulmonary effort is normal.     Breath sounds: Normal breath sounds.  Abdominal:      General: Bowel sounds are decreased. There is distension.     Tenderness: There is no abdominal tenderness.  Skin:    General: Skin is warm.  Neurological:     Mental Status: He is alert and oriented to person, place, and time.    UC Treatments / Results  Labs (all labs ordered are listed, but only abnormal results are displayed) Labs Reviewed - No data to display  EKG   Radiology DG Abd 2 Views  Result Date: 12/31/2019 CLINICAL DATA:  Abdominal pain especially RIGHT upper quadrant for 1 week with nausea and slight diarrhea EXAM: ABDOMEN - 2 VIEW COMPARISON:  CT abdomen and pelvis 06/27/2018 FINDINGS: Surgical clips RIGHT upper quadrant from cholecystectomy. Nonobstructive bowel gas pattern. No bowel dilatation, bowel wall thickening, or free air. Streaky atelectasis LEFT base. Mild scattered degenerative disc disease changes lumbar spine. Minimal atherosclerotic calcifications in pelvis. No urinary tract calcification. IMPRESSION: No acute abnormalities. Electronically Signed   By: Lavonia Dana M.D.   On: 12/31/2019 12:59    Procedures Procedures (including critical care time)  Medications Ordered in UC Medications - No data to display  Initial Impression / Assessment and Plan / UC Course  I have reviewed the triage vital signs and the nursing notes.  Pertinent labs & imaging results that were available during my care of the patient were reviewed by me and considered in my medical decision making (see chart for details).     Abdominal exam reassuring today. Suspect symptoms are related to underlying acid reflux and gastric irritation. Encouraged to continue Protonix. Will trial Carafate for management of cramping and burning related to food. Advised to follow-up with PCP on Monday to schedule follow-up for further work including labs. Imaging results normal. Patient notified of imaging results via phone.  An After Visit Summary was printed and given to the patient.Precautions  discussed. Red flags discussed. Questions invited and answered.They voiced understanding and agreement. Final Clinical Impressions(s) / UC Diagnoses   Final diagnoses:  Epigastric pain  RUQ cramping     Discharge Instructions     Follow-up with your primary care on Monday as I have no definitive cause or source of your pain symptoms. If symptoms worsen, go immediately to the Emergency Department. Take Carafate as prescribed prior to meals to help with cramping related to food.    ED Prescriptions    Medication Sig Dispense Auth. Provider   sucralfate (CARAFATE) 1 GM/10ML suspension Take 10 mLs (1 g total) by mouth 4 (four) times daily -  with meals and at bedtime. 420 mL Scot Jun, FNP     PDMP not reviewed this encounter.   Scot Jun, FNP 12/31/19 [id number]

## 2019-12-31 NOTE — ED Triage Notes (Signed)
Pt reports abdominal pain x 1 week. Pain is worse after eating. Tums gives somewhat relief.

## 2019-12-31 NOTE — Discharge Instructions (Signed)
Follow-up with your primary care on Monday as I have no definitive cause or source of your pain symptoms. If symptoms worsen, go immediately to the Emergency Department. Take Carafate as prescribed prior to meals to help with cramping related to food.

## 2019-12-31 NOTE — ED Notes (Signed)
Notified Lavell Anchors, NP of [facility] of 210

## 2020-01-02 DIAGNOSIS — E119 Type 2 diabetes mellitus without complications: Secondary | ICD-10-CM | POA: Diagnosis not present

## 2020-01-02 DIAGNOSIS — M1990 Unspecified osteoarthritis, unspecified site: Secondary | ICD-10-CM | POA: Diagnosis not present

## 2020-01-02 DIAGNOSIS — M064 Inflammatory polyarthropathy: Secondary | ICD-10-CM | POA: Diagnosis not present

## 2020-01-02 DIAGNOSIS — J45909 Unspecified asthma, uncomplicated: Secondary | ICD-10-CM | POA: Diagnosis not present

## 2020-01-02 DIAGNOSIS — M109 Gout, unspecified: Secondary | ICD-10-CM | POA: Diagnosis not present

## 2020-01-03 DIAGNOSIS — R748 Abnormal levels of other serum enzymes: Secondary | ICD-10-CM | POA: Diagnosis not present

## 2020-01-03 DIAGNOSIS — R109 Unspecified abdominal pain: Secondary | ICD-10-CM | POA: Diagnosis not present

## 2020-01-06 DIAGNOSIS — R109 Unspecified abdominal pain: Secondary | ICD-10-CM | POA: Diagnosis not present

## 2020-01-15 NOTE — Progress Notes (Signed)
  Subjective:  Patient ID: Troy [name], male    DOB: 12-02-1965,  MRN: [id number]  Chief Complaint  Patient presents with  . Foot Pain    pt is here for routine foot care, pt is also a diabetic type 2, pt also has an amputation of the left foot.   54 y.o. male presents with the above complaint. History confirmed with patient.   Objective:  Physical Exam: warm, good capillary refill, nail exam onychomycosis of the toenails, DP pulses palpable, PT pulses palpable and protective sensation absent Left Foot: Hx BKA  Right Foot: HPK submet 2   No images are attached to the encounter.  Assessment:   1. Deformity of metatarsal bone of right foot   2. Onychomycosis of multiple toenails with type 2 diabetes mellitus and peripheral angiopathy ([place])    Plan:  Patient was evaluated and treated and all questions answered.  Onychomycosis, Diabetes and DPN -Callus debrided, pre-ulcerative. No open ulcer. Offloaded. F/u in 5 weeks  Return in about 5 weeks (around 02/03/2020).

## 2020-01-16 DIAGNOSIS — R748 Abnormal levels of other serum enzymes: Secondary | ICD-10-CM | POA: Diagnosis not present

## 2020-01-18 DIAGNOSIS — E113522 Type 2 diabetes mellitus with proliferative diabetic retinopathy with traction retinal detachment involving the macula, left eye: Secondary | ICD-10-CM | POA: Diagnosis not present

## 2020-01-19 DIAGNOSIS — G4733 Obstructive sleep apnea (adult) (pediatric): Secondary | ICD-10-CM | POA: Diagnosis not present

## 2020-01-20 ENCOUNTER — Ambulatory Visit: Payer: Medicare HMO | Admitting: Orthotics

## 2020-01-20 ENCOUNTER — Other Ambulatory Visit: Payer: Self-pay

## 2020-01-20 DIAGNOSIS — L84 Corns and callosities: Secondary | ICD-10-CM | POA: Diagnosis not present

## 2020-01-20 DIAGNOSIS — E1142 Type 2 diabetes mellitus with diabetic polyneuropathy: Secondary | ICD-10-CM | POA: Diagnosis not present

## 2020-01-20 DIAGNOSIS — Z89432 Acquired absence of left foot: Secondary | ICD-10-CM | POA: Diagnosis not present

## 2020-02-03 ENCOUNTER — Other Ambulatory Visit: Payer: Self-pay

## 2020-02-03 ENCOUNTER — Ambulatory Visit: Payer: Medicare HMO | Admitting: Podiatry

## 2020-02-03 DIAGNOSIS — L84 Corns and callosities: Secondary | ICD-10-CM | POA: Diagnosis not present

## 2020-02-03 DIAGNOSIS — E1142 Type 2 diabetes mellitus with diabetic polyneuropathy: Secondary | ICD-10-CM | POA: Diagnosis not present

## 2020-02-04 NOTE — Progress Notes (Signed)
  Subjective:  Patient ID: Troy [name], male    DOB: [birth year],  MRN: [id number]  Chief Complaint  Patient presents with  . Callouses    Right plantar callous trim   54 y.o. male presents with the above complaint. History confirmed with patient.   Objective:  Physical Exam: warm, good capillary refill, nail exam onychomycosis of the toenails, DP pulses palpable, PT pulses palpable and protective sensation absent Left Foot: Hx BKA  Right Foot: HPK submet 2   No images are attached to the encounter.  Assessment:   1. DM type 2 with diabetic peripheral neuropathy ([place])   2. Callus    Plan:  Patient was evaluated and treated and all questions answered.  Onychomycosis, Diabetes and DPN -Callus debridement with 312 blade without incident.  Dispensed tubefoam bunion shields  Procedure: Paring of Lesion Rationale: painful hyperkeratotic lesion Type of Debridement: manual, sharp debridement. Instrumentation: 312 blade Number of Lesions: 1    No follow-ups on file.

## 2020-02-06 DIAGNOSIS — E1140 Type 2 diabetes mellitus with diabetic neuropathy, unspecified: Secondary | ICD-10-CM | POA: Diagnosis not present

## 2020-02-06 DIAGNOSIS — E7800 Pure hypercholesterolemia, unspecified: Secondary | ICD-10-CM | POA: Diagnosis not present

## 2020-02-06 DIAGNOSIS — I10 Essential (primary) hypertension: Secondary | ICD-10-CM | POA: Diagnosis not present

## 2020-02-06 DIAGNOSIS — Z89512 Acquired absence of left leg below knee: Secondary | ICD-10-CM | POA: Diagnosis not present

## 2020-02-06 DIAGNOSIS — Z7984 Long term (current) use of oral hypoglycemic drugs: Secondary | ICD-10-CM | POA: Diagnosis not present

## 2020-02-06 DIAGNOSIS — M1A9XX1 Chronic gout, unspecified, with tophus (tophi): Secondary | ICD-10-CM | POA: Diagnosis not present

## 2020-02-06 DIAGNOSIS — E1151 Type 2 diabetes mellitus with diabetic peripheral angiopathy without gangrene: Secondary | ICD-10-CM | POA: Diagnosis not present

## 2020-02-06 DIAGNOSIS — G4730 Sleep apnea, unspecified: Secondary | ICD-10-CM | POA: Diagnosis not present

## 2020-02-09 DIAGNOSIS — R6889 Other general symptoms and signs: Secondary | ICD-10-CM | POA: Diagnosis not present

## 2020-02-10 DIAGNOSIS — R6889 Other general symptoms and signs: Secondary | ICD-10-CM | POA: Diagnosis not present

## 2020-02-10 DIAGNOSIS — Z961 Presence of intraocular lens: Secondary | ICD-10-CM | POA: Diagnosis not present

## 2020-02-10 DIAGNOSIS — H3582 Retinal ischemia: Secondary | ICD-10-CM | POA: Diagnosis not present

## 2020-02-10 DIAGNOSIS — E113513 Type 2 diabetes mellitus with proliferative diabetic retinopathy with macular edema, bilateral: Secondary | ICD-10-CM | POA: Diagnosis not present

## 2020-02-10 DIAGNOSIS — H40053 Ocular hypertension, bilateral: Secondary | ICD-10-CM | POA: Diagnosis not present

## 2020-02-24 DIAGNOSIS — I10 Essential (primary) hypertension: Secondary | ICD-10-CM | POA: Diagnosis not present

## 2020-02-24 DIAGNOSIS — J45909 Unspecified asthma, uncomplicated: Secondary | ICD-10-CM | POA: Diagnosis not present

## 2020-02-24 DIAGNOSIS — N182 Chronic kidney disease, stage 2 (mild): Secondary | ICD-10-CM | POA: Diagnosis not present

## 2020-02-24 DIAGNOSIS — E11621 Type 2 diabetes mellitus with foot ulcer: Secondary | ICD-10-CM | POA: Diagnosis not present

## 2020-02-24 DIAGNOSIS — E782 Mixed hyperlipidemia: Secondary | ICD-10-CM | POA: Diagnosis not present

## 2020-02-24 DIAGNOSIS — J45901 Unspecified asthma with (acute) exacerbation: Secondary | ICD-10-CM | POA: Diagnosis not present

## 2020-02-24 DIAGNOSIS — F3341 Major depressive disorder, recurrent, in partial remission: Secondary | ICD-10-CM | POA: Diagnosis not present

## 2020-02-24 DIAGNOSIS — E1140 Type 2 diabetes mellitus with diabetic neuropathy, unspecified: Secondary | ICD-10-CM | POA: Diagnosis not present

## 2020-02-24 DIAGNOSIS — E7800 Pure hypercholesterolemia, unspecified: Secondary | ICD-10-CM | POA: Diagnosis not present

## 2020-03-09 ENCOUNTER — Other Ambulatory Visit: Payer: Self-pay

## 2020-03-09 ENCOUNTER — Ambulatory Visit: Payer: Medicare HMO | Admitting: Podiatry

## 2020-03-09 DIAGNOSIS — E782 Mixed hyperlipidemia: Secondary | ICD-10-CM | POA: Diagnosis not present

## 2020-03-09 DIAGNOSIS — J45901 Unspecified asthma with (acute) exacerbation: Secondary | ICD-10-CM | POA: Diagnosis not present

## 2020-03-09 DIAGNOSIS — E7800 Pure hypercholesterolemia, unspecified: Secondary | ICD-10-CM | POA: Diagnosis not present

## 2020-03-09 DIAGNOSIS — K219 Gastro-esophageal reflux disease without esophagitis: Secondary | ICD-10-CM | POA: Diagnosis not present

## 2020-03-09 DIAGNOSIS — F3341 Major depressive disorder, recurrent, in partial remission: Secondary | ICD-10-CM | POA: Diagnosis not present

## 2020-03-09 DIAGNOSIS — N182 Chronic kidney disease, stage 2 (mild): Secondary | ICD-10-CM | POA: Diagnosis not present

## 2020-03-09 DIAGNOSIS — M21961 Unspecified acquired deformity of right lower leg: Secondary | ICD-10-CM

## 2020-03-09 DIAGNOSIS — E1142 Type 2 diabetes mellitus with diabetic polyneuropathy: Secondary | ICD-10-CM

## 2020-03-09 DIAGNOSIS — J45909 Unspecified asthma, uncomplicated: Secondary | ICD-10-CM | POA: Diagnosis not present

## 2020-03-09 DIAGNOSIS — I10 Essential (primary) hypertension: Secondary | ICD-10-CM | POA: Diagnosis not present

## 2020-03-09 DIAGNOSIS — E1140 Type 2 diabetes mellitus with diabetic neuropathy, unspecified: Secondary | ICD-10-CM | POA: Diagnosis not present

## 2020-03-12 NOTE — Progress Notes (Signed)
  Subjective:  Patient ID: Troy [name], male    DOB: Apr 13, 1966,  MRN: [id number]  Chief Complaint  Patient presents with  . Callouses    Right plantar foot callous    54 y.o. male presents with the above complaint. History confirmed with patient.   Objective:  Physical Exam: warm, good capillary refill, nail exam onychomycosis of the toenails, DP pulses palpable, PT pulses palpable and protective sensation absent Left Foot: Hx BKA  Right Foot: HPK submet 2, HAV with pre-ulcerative area 1st MPJ  No images are attached to the encounter.  Assessment:   1. DM type 2 with diabetic peripheral neuropathy ([place])   2. Deformity of metatarsal bone of right foot    Plan:  Patient was evaluated and treated and all questions answered.  Onychomycosis, Diabetes and DPN -Debridement of lesion plantarly, offloaded with tubeboam bunion shield medially  No follow-ups on file.

## 2020-03-24 DIAGNOSIS — W19XXXA Unspecified fall, initial encounter: Secondary | ICD-10-CM | POA: Diagnosis not present

## 2020-03-24 DIAGNOSIS — F10929 Alcohol use, unspecified with intoxication, unspecified: Secondary | ICD-10-CM | POA: Diagnosis not present

## 2020-03-24 DIAGNOSIS — R55 Syncope and collapse: Secondary | ICD-10-CM | POA: Diagnosis not present

## 2020-04-06 DIAGNOSIS — E782 Mixed hyperlipidemia: Secondary | ICD-10-CM | POA: Diagnosis not present

## 2020-04-06 DIAGNOSIS — J45901 Unspecified asthma with (acute) exacerbation: Secondary | ICD-10-CM | POA: Diagnosis not present

## 2020-04-06 DIAGNOSIS — J45909 Unspecified asthma, uncomplicated: Secondary | ICD-10-CM | POA: Diagnosis not present

## 2020-04-06 DIAGNOSIS — M1990 Unspecified osteoarthritis, unspecified site: Secondary | ICD-10-CM | POA: Diagnosis not present

## 2020-04-06 DIAGNOSIS — K219 Gastro-esophageal reflux disease without esophagitis: Secondary | ICD-10-CM | POA: Diagnosis not present

## 2020-04-06 DIAGNOSIS — E7800 Pure hypercholesterolemia, unspecified: Secondary | ICD-10-CM | POA: Diagnosis not present

## 2020-04-06 DIAGNOSIS — R6889 Other general symptoms and signs: Secondary | ICD-10-CM | POA: Diagnosis not present

## 2020-04-06 DIAGNOSIS — M109 Gout, unspecified: Secondary | ICD-10-CM | POA: Diagnosis not present

## 2020-04-06 DIAGNOSIS — M1A9XX1 Chronic gout, unspecified, with tophus (tophi): Secondary | ICD-10-CM | POA: Diagnosis not present

## 2020-04-06 DIAGNOSIS — E1140 Type 2 diabetes mellitus with diabetic neuropathy, unspecified: Secondary | ICD-10-CM | POA: Diagnosis not present

## 2020-04-06 DIAGNOSIS — E1151 Type 2 diabetes mellitus with diabetic peripheral angiopathy without gangrene: Secondary | ICD-10-CM | POA: Diagnosis not present

## 2020-04-06 DIAGNOSIS — E119 Type 2 diabetes mellitus without complications: Secondary | ICD-10-CM | POA: Diagnosis not present

## 2020-04-06 DIAGNOSIS — F3341 Major depressive disorder, recurrent, in partial remission: Secondary | ICD-10-CM | POA: Diagnosis not present

## 2020-04-06 DIAGNOSIS — M064 Inflammatory polyarthropathy: Secondary | ICD-10-CM | POA: Diagnosis not present

## 2020-04-06 DIAGNOSIS — I10 Essential (primary) hypertension: Secondary | ICD-10-CM | POA: Diagnosis not present

## 2020-04-10 DIAGNOSIS — R6889 Other general symptoms and signs: Secondary | ICD-10-CM | POA: Diagnosis not present

## 2020-04-13 ENCOUNTER — Other Ambulatory Visit: Payer: Self-pay

## 2020-04-13 ENCOUNTER — Ambulatory Visit: Payer: Medicare HMO | Admitting: Podiatry

## 2020-04-13 ENCOUNTER — Ambulatory Visit (INDEPENDENT_AMBULATORY_CARE_PROVIDER_SITE_OTHER): Payer: Medicare HMO | Admitting: Podiatry

## 2020-04-13 DIAGNOSIS — M21961 Unspecified acquired deformity of right lower leg: Secondary | ICD-10-CM

## 2020-04-13 DIAGNOSIS — L84 Corns and callosities: Secondary | ICD-10-CM

## 2020-04-13 DIAGNOSIS — R6889 Other general symptoms and signs: Secondary | ICD-10-CM | POA: Diagnosis not present

## 2020-04-13 DIAGNOSIS — E1142 Type 2 diabetes mellitus with diabetic polyneuropathy: Secondary | ICD-10-CM

## 2020-04-13 NOTE — Progress Notes (Signed)
  Subjective:  Patient ID: Troy [name], male    DOB: March 29, 1966,  MRN: [id number]  Chief Complaint  Patient presents with  . Diabetic Ulcer    5WK F/U- pt states he is doing alot better-    54 y.o. male presents with the above complaint. History confirmed with patient.   Objective:  Physical Exam: warm, good capillary refill, nail exam onychomycosis of the toenails, DP pulses palpable, PT pulses palpable and protective sensation absent Left Foot: Hx BKA  Right Foot: HPK submet 2, HAV with pre-ulcerative area 1st MPJ  No images are attached to the encounter.  Assessment:   1. DM type 2 with diabetic peripheral neuropathy ([place])   2. Deformity of metatarsal bone of right foot   3. Callus    Plan:  Patient was evaluated and treated and all questions answered.  Onychomycosis, Diabetes and DPN -Debridement of lesion as below -Dispense tubefoam  Procedure: Paring of Lesion Rationale: painful hyperkeratotic lesion Type of Debridement: manual, sharp debridement. Instrumentation: 312 blade Number of Lesions: 1   No follow-ups on file.

## 2020-04-18 DIAGNOSIS — H4052X3 Glaucoma secondary to other eye disorders, left eye, severe stage: Secondary | ICD-10-CM | POA: Diagnosis not present

## 2020-04-18 DIAGNOSIS — E113522 Type 2 diabetes mellitus with proliferative diabetic retinopathy with traction retinal detachment involving the macula, left eye: Secondary | ICD-10-CM | POA: Diagnosis not present

## 2020-04-20 DIAGNOSIS — G4733 Obstructive sleep apnea (adult) (pediatric): Secondary | ICD-10-CM | POA: Diagnosis not present

## 2020-05-07 DIAGNOSIS — R6889 Other general symptoms and signs: Secondary | ICD-10-CM | POA: Diagnosis not present

## 2020-05-07 DIAGNOSIS — M1A9XX1 Chronic gout, unspecified, with tophus (tophi): Secondary | ICD-10-CM | POA: Diagnosis not present

## 2020-05-21 DIAGNOSIS — J45909 Unspecified asthma, uncomplicated: Secondary | ICD-10-CM | POA: Diagnosis not present

## 2020-05-21 DIAGNOSIS — E7800 Pure hypercholesterolemia, unspecified: Secondary | ICD-10-CM | POA: Diagnosis not present

## 2020-05-21 DIAGNOSIS — F3341 Major depressive disorder, recurrent, in partial remission: Secondary | ICD-10-CM | POA: Diagnosis not present

## 2020-05-21 DIAGNOSIS — E1151 Type 2 diabetes mellitus with diabetic peripheral angiopathy without gangrene: Secondary | ICD-10-CM | POA: Diagnosis not present

## 2020-05-21 DIAGNOSIS — J45901 Unspecified asthma with (acute) exacerbation: Secondary | ICD-10-CM | POA: Diagnosis not present

## 2020-05-21 DIAGNOSIS — K219 Gastro-esophageal reflux disease without esophagitis: Secondary | ICD-10-CM | POA: Diagnosis not present

## 2020-05-21 DIAGNOSIS — I10 Essential (primary) hypertension: Secondary | ICD-10-CM | POA: Diagnosis not present

## 2020-05-21 DIAGNOSIS — E782 Mixed hyperlipidemia: Secondary | ICD-10-CM | POA: Diagnosis not present

## 2020-05-21 DIAGNOSIS — M1A9XX1 Chronic gout, unspecified, with tophus (tophi): Secondary | ICD-10-CM | POA: Diagnosis not present

## 2020-05-21 DIAGNOSIS — E1140 Type 2 diabetes mellitus with diabetic neuropathy, unspecified: Secondary | ICD-10-CM | POA: Diagnosis not present

## 2020-05-22 ENCOUNTER — Other Ambulatory Visit: Payer: Self-pay

## 2020-05-22 ENCOUNTER — Ambulatory Visit (INDEPENDENT_AMBULATORY_CARE_PROVIDER_SITE_OTHER): Payer: Medicare HMO | Admitting: Podiatry

## 2020-05-22 DIAGNOSIS — M21961 Unspecified acquired deformity of right lower leg: Secondary | ICD-10-CM

## 2020-05-22 DIAGNOSIS — L84 Corns and callosities: Secondary | ICD-10-CM

## 2020-05-22 DIAGNOSIS — E1142 Type 2 diabetes mellitus with diabetic polyneuropathy: Secondary | ICD-10-CM

## 2020-05-27 NOTE — Progress Notes (Signed)
°  Subjective:  Patient ID: Troy [name], male    DOB: 1965-12-01,  MRN: [id number]  Chief Complaint  Patient presents with   Foot Ulcer    Ulcer check   54 y.o. male presents with the above complaint. History confirmed with patient. Denies new issues  Objective:  Physical Exam: warm, good capillary refill, nail exam onychomycosis of the toenails, DP pulses palpable, PT pulses palpable and protective sensation absent Left Foot: Hx BKA  Right Foot: HPK submet 2, HAV with pre-ulcerative area 1st MPJ  No images are attached to the encounter.  Assessment:   1. DM type 2 with diabetic peripheral neuropathy ([place])   2. Deformity of metatarsal bone of right foot   3. Callus    Plan:  Patient was evaluated and treated and all questions answered.  Onychomycosis, Diabetes and DPN -Lesion debrided courtesy. Additional offloading padding discussed the patient no new active ulceration follow-up in 5 weeks for recheck  No follow-ups on file.

## 2020-05-30 DIAGNOSIS — M1A9XX1 Chronic gout, unspecified, with tophus (tophi): Secondary | ICD-10-CM | POA: Diagnosis not present

## 2020-05-30 DIAGNOSIS — M064 Inflammatory polyarthropathy: Secondary | ICD-10-CM | POA: Diagnosis not present

## 2020-05-30 DIAGNOSIS — Z23 Encounter for immunization: Secondary | ICD-10-CM | POA: Diagnosis not present

## 2020-05-30 DIAGNOSIS — M1990 Unspecified osteoarthritis, unspecified site: Secondary | ICD-10-CM | POA: Diagnosis not present

## 2020-05-30 DIAGNOSIS — J45909 Unspecified asthma, uncomplicated: Secondary | ICD-10-CM | POA: Diagnosis not present

## 2020-05-30 DIAGNOSIS — E119 Type 2 diabetes mellitus without complications: Secondary | ICD-10-CM | POA: Diagnosis not present

## 2020-06-04 DIAGNOSIS — R6889 Other general symptoms and signs: Secondary | ICD-10-CM | POA: Diagnosis not present

## 2020-06-04 DIAGNOSIS — M1A9XX1 Chronic gout, unspecified, with tophus (tophi): Secondary | ICD-10-CM | POA: Diagnosis not present

## 2020-06-11 DIAGNOSIS — H3582 Retinal ischemia: Secondary | ICD-10-CM | POA: Diagnosis not present

## 2020-06-11 DIAGNOSIS — E113513 Type 2 diabetes mellitus with proliferative diabetic retinopathy with macular edema, bilateral: Secondary | ICD-10-CM | POA: Diagnosis not present

## 2020-06-11 DIAGNOSIS — H40053 Ocular hypertension, bilateral: Secondary | ICD-10-CM | POA: Diagnosis not present

## 2020-06-11 DIAGNOSIS — Z961 Presence of intraocular lens: Secondary | ICD-10-CM | POA: Diagnosis not present

## 2020-06-19 DIAGNOSIS — M1A9XX1 Chronic gout, unspecified, with tophus (tophi): Secondary | ICD-10-CM | POA: Diagnosis not present

## 2020-06-25 DIAGNOSIS — E782 Mixed hyperlipidemia: Secondary | ICD-10-CM | POA: Diagnosis not present

## 2020-06-25 DIAGNOSIS — N182 Chronic kidney disease, stage 2 (mild): Secondary | ICD-10-CM | POA: Diagnosis not present

## 2020-06-25 DIAGNOSIS — J45901 Unspecified asthma with (acute) exacerbation: Secondary | ICD-10-CM | POA: Diagnosis not present

## 2020-06-25 DIAGNOSIS — I10 Essential (primary) hypertension: Secondary | ICD-10-CM | POA: Diagnosis not present

## 2020-06-25 DIAGNOSIS — F3341 Major depressive disorder, recurrent, in partial remission: Secondary | ICD-10-CM | POA: Diagnosis not present

## 2020-06-25 DIAGNOSIS — K219 Gastro-esophageal reflux disease without esophagitis: Secondary | ICD-10-CM | POA: Diagnosis not present

## 2020-06-25 DIAGNOSIS — J45909 Unspecified asthma, uncomplicated: Secondary | ICD-10-CM | POA: Diagnosis not present

## 2020-06-25 DIAGNOSIS — E1140 Type 2 diabetes mellitus with diabetic neuropathy, unspecified: Secondary | ICD-10-CM | POA: Diagnosis not present

## 2020-06-25 DIAGNOSIS — E7800 Pure hypercholesterolemia, unspecified: Secondary | ICD-10-CM | POA: Diagnosis not present

## 2020-06-28 ENCOUNTER — Other Ambulatory Visit (HOSPITAL_COMMUNITY): Payer: Self-pay | Admitting: Family

## 2020-06-28 ENCOUNTER — Other Ambulatory Visit: Payer: Self-pay

## 2020-06-28 ENCOUNTER — Ambulatory Visit (HOSPITAL_COMMUNITY)
Admission: EM | Admit: 2020-06-28 | Discharge: 2020-06-28 | Disposition: A | Discharge status: Home / Self Care | Payer: Medicare HMO | Attending: Family | Admitting: Family

## 2020-06-28 ENCOUNTER — Encounter (HOSPITAL_COMMUNITY): Payer: Self-pay

## 2020-06-28 DIAGNOSIS — R059 Cough, unspecified: Secondary | ICD-10-CM

## 2020-06-28 DIAGNOSIS — R0981 Nasal congestion: Secondary | ICD-10-CM | POA: Diagnosis not present

## 2020-06-28 DIAGNOSIS — R062 Wheezing: Secondary | ICD-10-CM | POA: Diagnosis not present

## 2020-06-28 DIAGNOSIS — Z20822 Contact with and (suspected) exposure to covid-19: Secondary | ICD-10-CM | POA: Diagnosis not present

## 2020-06-28 MED ORDER — BENZONATATE 100 MG PO CAPS
100.0000 mg | ORAL_CAPSULE | Freq: Three times a day (TID) | ORAL | 0 refills | Status: DC | PRN
Start: 2020-06-28 — End: 2020-09-25

## 2020-06-28 MED ORDER — ALBUTEROL SULFATE (2.5 MG/3ML) 0.083% IN NEBU: 2.5000 mg | INHALATION_SOLUTION | RESPIRATORY_TRACT | 1 refills | Status: DC | PRN

## 2020-06-28 NOTE — ED Provider Notes (Signed)
[place]    CSN: [id number] Arrival date & time: 06/28/20  7408      History   Chief Complaint Chief Complaint  Patient presents with  . Cough    HPI CORNELIS KLUVER is a 54 y.o. male.   54 year old male presents with cough that started yesterday. Woke up with sore throat today and now having more nasal congestion. Unable to sleep last night due to cough. Denies any fever, nausea, or vomiting. Had some diarrhea yesterday but not unusual. Has taken Robitussin honey with minimal relief. Has had 2 doses of COVID 19 vaccine plus booster as well as flu vaccine. No known exposure to COVID 19. Has a nebulizer machine but Albuterol nebules have expired- requests refill. Has multiple chronic health issues including HTN, hyperlipidemia, type 2 DM, GERD, sleep apnea, gout, environmental allergies, asthma, and  chronic kidney disease. On over 15 medications including Lisinopril/HCTZ, Lipitor, aspirin, Lantus, Metformin, Amaryl, Allopurinol, Gabapentin, Protonix, Advair, Flonase and various eye drops daily and Colchicine, Albuterol, Tramadol, Carafate, Viagra, and Percocet prn.   The history is provided by the patient.    Past Medical History:  Diagnosis Date  . Asthma   . Bronchitis   . CKD (chronic kidney disease)   . Diabetes mellitus   . Diabetic Charcot foot ([place])    Left  . Gout   . Hypertension   . Kidney stones 11/30/2013  . Neuromuscular disorder ([place])   . Obesity   . Pancreatitis   . Polyneuropathy in diabetes [facility])     Patient Active Problem List   Diagnosis Date Noted  . Painful amputation stump ([place]) 07/01/2019  . Hyperlipidemia 09/07/2018  . Diabetic foot ulcer ([place]) 09/07/2018  . History of left below knee amputation ([place]) 03/26/2018  . Dysphagia 09/01/2017  . Special screening for malignant neoplasms, colon 09/01/2017  . Esophageal reflux 09/01/2017  . Acute osteomyelitis of left foot ([place])   . Diabetic foot ([place]) 02/02/2016  . Osteomyelitis ([place])  02/02/2016  . Osteomyelitis of foot, acute, left 02/02/2016  . CKD (chronic kidney disease), stage II 02/02/2016  . Anemia 02/02/2016  . Kidney stones 11/30/2013  . Cholelithiasis: PER CT ABD/PELVIS AND ABD [place] 11/30/13 11/30/2013  . Acute renal failure ([place]) 11/29/2013  . Acute pancreatitis 11/29/2013  . Asthma exacerbation 11/09/2013  . CAP (community acquired pneumonia) 07/18/2013  . Diabetes ([place]) 07/18/2013  . Hypertension     Past Surgical History:  Procedure Laterality Date  . AMPUTATION Left 02/06/2016   Procedure: AMPUTATION BELOW KNEE;  Surgeon: Marybelle Killings, MD;  Location: [place];  Service: Orthopedics;  Laterality: Left;  . CHOLECYSTECTOMY N/A 12/03/2013   Procedure: LAPAROSCOPIC CHOLECYSTECTOMY WITH INTRAOPERATIVE CHOLANGIOGRAM;  Surgeon: Rolm Bookbinder, MD;  Location: [place];  Service: General;  Laterality: N/A;  . COLONOSCOPY WITH PROPOFOL N/A 09/01/2017   Procedure: COLONOSCOPY WITH PROPOFOL;  Surgeon: Wilford Corner, MD;  Location: [facility];  Service: Endoscopy;  Laterality: N/A;  . ESOPHAGOGASTRODUODENOSCOPY (EGD) WITH PROPOFOL N/A 09/01/2017   Procedure: ESOPHAGOGASTRODUODENOSCOPY (EGD) WITH PROPOFOL;  Surgeon: Wilford Corner, MD;  Location: [facility];  Service: Endoscopy;  Laterality: N/A;  . NO PAST SURGERIES         Home Medications    Prior to Admission medications   Medication Sig Start Date End Date Taking? Authorizing Provider  ADVAIR DISKUS 250-50 MCG/DOSE AEPB  08/09/19   [provider]  albuterol (PROVENTIL HFA;VENTOLIN HFA) 108 (90 Base) MCG/ACT inhaler Inhale 1-2 puffs into the lungs every  6 (six) hours as needed for wheezing or shortness of breath. 08/25/15   Hyman Bible, PA-C  albuterol (PROVENTIL) (2.5 MG/3ML) 0.083% nebulizer solution Take 3 mLs (2.5 mg total) by nebulization every 4 (four) hours as needed for wheezing. 06/28/20   Katy Apo, NP  Alcohol Swabs (B-D SINGLE USE SWABS REGULAR) PADS  07/29/19   [provider]  allopurinol (ZYLOPRIM) 100 MG tablet  09/21/19   [provider]  allopurinol (ZYLOPRIM) 300 MG tablet  10/05/19   [provider]  aspirin (ASPIRIN EC) 81 MG EC tablet Take 81 mg by mouth daily. Swallow whole.    [provider]  atorvastatin (LIPITOR) 10 MG tablet Take 10 mg by mouth daily. 03/06/17   [provider]  benzonatate (TESSALON) 100 MG capsule Take 1 capsule (100 mg total) by mouth 3 (three) times daily as needed for cough. 06/28/20   Katy Apo, NP  Blood Glucose Calibration (ACCU-CHEK AVIVA) SOLN  07/29/19   [provider]  Blood Glucose Monitoring Suppl (ACCU-CHEK AVIVA PLUS) w/Device KIT USE TO TEST YOUR BLOOD SUGAR THREE TIMES A DAY 01/12/19   [provider]  Colchicine 0.6 MG CAPS  02/07/19   [provider]  CONTOUR NEXT TEST test strip 1 each daily as needed. 03/24/17   [provider]  dorzolamide-timolol (COSOPT) 22.3-6.8 MG/ML ophthalmic solution  08/19/19   [provider]  DROPLET PEN NEEDLES 31G X 6 MM [place]  07/29/19   [provider]  DUREZOL 0.05 % EMUL  12/14/19   [provider]  fluticasone (FLONASE) 50 MCG/ACT nasal spray Place 2 sprays into both nostrils daily. 82/42/35   Delora Fuel, MD  gabapentin (NEURONTIN) 300 MG capsule  02/03/20   [provider]  glimepiride (AMARYL) 4 MG tablet Take 4 mg by mouth daily with breakfast.    [provider]  LANTUS SOLOSTAR 100 UNIT/ML Solostar Pen Inject 10 Units into the skin at bedtime.  01/10/16   [provider]  lisinopril (ZESTRIL) 20 MG tablet  08/17/19   [provider]  lisinopril-hydrochlorothiazide (PRINZIDE,ZESTORETIC) 10-12.5 MG tablet Take 1 tablet by mouth daily.  05/06/16   [provider]  metFORMIN (GLUCOPHAGE) 1000 MG tablet Take 1,000 mg by mouth 2 (two) times daily. 03/16/17   [provider]  MICROLET LANCETS MISC 1 each daily as needed.  02/16/17   [provider]  Multiple Vitamin (MULTIVITAMIN WITH MINERALS) TABS tablet Take 1 tablet by mouth daily.    [provider]  nystatin (MYCOSTATIN) 100000 UNIT/ML suspension  01/04/20   [provider]  ofloxacin (OCUFLOX) 0.3 % ophthalmic solution  12/14/19   [provider]  oxyCODONE-acetaminophen (PERCOCET/ROXICET) 5-325 MG tablet Take 1 tablet by mouth every 4 (four) hours as needed. 06/22/19   [provider]  pantoprazole (PROTONIX) 40 MG tablet Take 40 mg by mouth daily.  06/11/16   [provider]  prednisoLONE acetate (PRED FORTE) 1 % ophthalmic suspension  09/21/19   [provider]  sildenafil (VIAGRA) 100 MG tablet  01/04/19   [provider]  sucralfate (CARAFATE) 1 GM/10ML suspension Take 10 mLs (1 g total) by mouth 4 (four) times daily -  with meals and at bedtime. 12/31/19   Scot Jun, FNP  traMADol Veatrice [place]bon) 50 MG tablet  12/02/18   [provider]  Wound Dressings (MEDIHONEY WOUND/BURN DRESSING) GEL Apply to affected are 3 times a week, and cover with sterile dressing. 08/26/18  Evelina Bucy, DPM    Family History Family History  Problem Relation Age of Onset  . Diabetes Mellitus II Father   . Diabetes Father   . Hypertension Father   . Diabetes Mother   . Hypertension Mother   . CAD Neg Hx     Social History Social History   Tobacco Use  . Smoking status: Never Smoker  . Smokeless tobacco: Never Used  Substance Use Topics  . Alcohol use: Yes    Comment: occasionally  . Drug use: No     Allergies   Patient has no known allergies.   Review of Systems Review of Systems  Constitutional: Positive for fatigue. Negative for activity change, appetite change, chills and fever.  HENT: Positive for congestion, postnasal drip, rhinorrhea, sinus pressure, sneezing and sore throat. Negative for ear discharge, ear pain, facial swelling, mouth sores and trouble swallowing.    Eyes: Negative for pain, discharge, redness and itching.  Respiratory: Positive for cough. Negative for chest tightness, shortness of breath and wheezing.   Gastrointestinal: Positive for diarrhea. Negative for nausea and vomiting.  Musculoskeletal: Positive for arthralgias and myalgias. Negative for neck pain and neck stiffness.  Skin: Negative for color change and rash.  Allergic/Immunologic: Positive for environmental allergies. Negative for food allergies.  Neurological: Negative for dizziness, tremors, seizures, syncope, speech difficulty and light-headedness.  Hematological: Negative for adenopathy. Does not bruise/bleed easily.  Psychiatric/Behavioral: Positive for sleep disturbance.     Physical Exam Triage Vital Signs ED Triage Vitals  Enc Vitals Group     BP 06/28/20 1101 (!) 143/92     Pulse Rate 06/28/20 1101 88     Resp 06/28/20 1101 19     Temp 06/28/20 1101 98.7 F (37.1 C)     Temp src --      SpO2 06/28/20 1101 98 %     Weight --      Height --      Head Circumference --      Peak Flow --      Pain Score 06/28/20 1059 4     Pain Loc --      Pain Edu? --      Excl. in [place]? --    No data found.  Updated Vital Signs BP (!) 143/92   Pulse 88   Temp 98.7 F (37.1 C)   Resp 19   SpO2 98%   Visual Acuity Right Eye Distance:   Left Eye Distance:   Bilateral Distance:    Right Eye Near:   Left Eye Near:    Bilateral Near:     Physical Exam Vitals and nursing note reviewed.  Constitutional:      General: He is awake. He is not in acute distress.    Appearance: He is well-developed and well-groomed. He is ill-appearing.     Comments: He is sitting comfortably on the exam table in no acute distress but appears tired and ill.   HENT:     Head: Normocephalic and atraumatic.     Right Ear: Hearing, tympanic membrane, ear canal and external ear normal.     Left Ear: Hearing, tympanic membrane, ear canal and external ear normal.     Nose: Mucosal edema  and congestion present.     Right Turbinates: Enlarged.     Left Turbinates: Enlarged.     Right Sinus: No maxillary sinus tenderness or frontal sinus tenderness.     Left Sinus: No maxillary sinus tenderness or frontal sinus  tenderness.     Mouth/Throat:     Lips: Pink.     Mouth: Mucous membranes are moist.     Pharynx: Uvula midline. Posterior oropharyngeal erythema present. No pharyngeal swelling, oropharyngeal exudate or uvula swelling.     Comments: Clear to yellow post nasal drainage present.  Eyes:     Extraocular Movements: Extraocular movements intact.     Conjunctiva/sclera: Conjunctivae normal.  Cardiovascular:     Rate and Rhythm: Normal rate and regular rhythm.     Heart sounds: Normal heart sounds. No murmur heard.   Pulmonary:     Effort: Pulmonary effort is normal. No respiratory distress.     Breath sounds: Normal air entry. No stridor or decreased air movement. Examination of the right-upper field reveals wheezing. Examination of the left-upper field reveals wheezing. Wheezing present. No decreased breath sounds, rhonchi or rales.  Musculoskeletal:     Cervical back: Normal range of motion and neck supple. No rigidity.  Lymphadenopathy:     Cervical: No cervical adenopathy.  Skin:    General: Skin is warm and dry.     Capillary Refill: Capillary refill takes less than 2 seconds.     Findings: No rash.  Neurological:     General: No focal deficit present.     Mental Status: He is alert and oriented to person, place, and time.  Psychiatric:        Mood and Affect: Mood normal.        Behavior: Behavior normal. Behavior is cooperative.        Thought Content: Thought content normal.        Judgment: Judgment normal.      UC Treatments / Results  Labs (all labs ordered are listed, but only abnormal results are displayed) Labs Reviewed  NOVEL CORONAVIRUS, NAA (HOSP ORDER, SEND-OUT TO REF LAB; TAT 18-24 HRS)    EKG   Radiology No results  found.  Procedures Procedures (including critical care time)  Medications Ordered in UC Medications - No data to display  Initial Impression / Assessment and Plan / UC Course  I have reviewed the triage vital signs and the nursing notes.  Pertinent labs & imaging results that were available during my care of the patient were reviewed by me and considered in my medical decision making (see chart for details).     Reviewed with patient that he probably has a viral upper respiratory illness. Doubt flu since no fever and minimal body aches. Recommend start Tessalon cough pills 1 every 8 hours as needed. Continue to push fluids to help loosen up mucus in sinuses and chest. Encouraged use of Advair daily and use Albuterol inhaler 2 puffs every 6 hours or Albuterol nebulizer every 4 to 6 hours as needed- use before bedtime to help with cough and congestion and to help with sleep. Avoid decongestants due to HTN. Continue Flonase use. Rest. Follow-up pending COVID 19 test results and in 3 to 4 days if not improving.   Final Clinical Impressions(s) / UC Diagnoses   Final diagnoses:  Cough  Nasal congestion  Wheezing     Discharge Instructions     Recommend start Tessalon cough pills 1 every 8 hours as needed. May use Albuterol nebulizer solution- every 4 to 6 hours as needed for wheezing and cough. Continue to push fluids to help loosen up mucus in sinuses and chest. Avoid decongestants since you have high blood pressure. Rest. Follow-up pending COVID 19 test results and in 3 to  4 days if not improving.     ED Prescriptions    Medication Sig Dispense Auth. Provider   albuterol (PROVENTIL) (2.5 MG/3ML) 0.083% nebulizer solution Take 3 mLs (2.5 mg total) by nebulization every 4 (four) hours as needed for wheezing. 75 mL Katy Apo, NP   benzonatate (TESSALON) 100 MG capsule Take 1 capsule (100 mg total) by mouth 3 (three) times daily as needed for cough. 21 capsule [facility], Nicholes Stairs, NP      PDMP not reviewed this encounter.   Katy Apo, NP 06/29/20 1009

## 2020-06-28 NOTE — ED Triage Notes (Addendum)
Pt presents with complaints of cough that started yesterday afternoon. Reports he coughed bad all night. This morning he woke up feeling congested. Pt also endorses a sore throat.

## 2020-06-28 NOTE — Discharge Instructions (Signed)
Recommend start Tessalon cough pills 1 every 8 hours as needed. May use Albuterol nebulizer solution- every 4 to 6 hours as needed for wheezing and cough. Continue to push fluids to help loosen up mucus in sinuses and chest. Avoid decongestants since you have high blood pressure. Rest. Follow-up pending COVID 19 test results and in 3 to 4 days if not improving.

## 2020-06-29 ENCOUNTER — Other Ambulatory Visit (HOSPITAL_COMMUNITY): Payer: Self-pay | Admitting: Internal Medicine

## 2020-06-29 LAB — NOVEL CORONAVIRUS, NAA (HOSP ORDER, SEND-OUT TO REF LAB; TAT 18-24 HRS): SARS-CoV-2, NAA: NOT DETECTED

## 2020-07-03 ENCOUNTER — Ambulatory Visit (INDEPENDENT_AMBULATORY_CARE_PROVIDER_SITE_OTHER): Payer: Medicare HMO | Admitting: Podiatry

## 2020-07-03 ENCOUNTER — Other Ambulatory Visit: Payer: Self-pay

## 2020-07-03 DIAGNOSIS — L84 Corns and callosities: Secondary | ICD-10-CM

## 2020-07-03 DIAGNOSIS — E1142 Type 2 diabetes mellitus with diabetic polyneuropathy: Secondary | ICD-10-CM

## 2020-07-03 DIAGNOSIS — M1A9XX1 Chronic gout, unspecified, with tophus (tophi): Secondary | ICD-10-CM | POA: Diagnosis not present

## 2020-07-03 DIAGNOSIS — R6889 Other general symptoms and signs: Secondary | ICD-10-CM | POA: Diagnosis not present

## 2020-07-03 NOTE — Progress Notes (Signed)
  Subjective:  Patient ID: Troy [name], male    DOB: 03/20/1966,  MRN: [id number]  Chief Complaint  Patient presents with  . Diabetic Ulcer    right foot Hallux medial    54 y.o. male presents with the above complaint. History confirmed with patient.   Objective:  Physical Exam: warm, good capillary refill, nail exam onychomycosis of the toenails, DP pulses palpable, PT pulses palpable and protective sensation absent Left Foot: Hx BKA  Right Foot: HPK submet 2, HAV with pre-ulcerative area 1st MPJ  No images are attached to the encounter.  Assessment:   1. DM type 2 with diabetic peripheral neuropathy ([place])   2. Callus    Plan:  Patient was evaluated and treated and all questions answered.  Onychomycosis, Diabetes and DPN -Lesion debrided plantar foot. No ulcerations noted.  Procedure: Paring of Lesion Rationale: painful hyperkeratotic lesion Type of Debridement: manual, sharp debridement. Instrumentation: 312 blade Number of Lesions: 1   No follow-ups on file.

## 2020-07-16 ENCOUNTER — Other Ambulatory Visit (HOSPITAL_COMMUNITY): Payer: Self-pay | Admitting: Rheumatology

## 2020-07-16 DIAGNOSIS — Z79899 Other long term (current) drug therapy: Secondary | ICD-10-CM | POA: Diagnosis not present

## 2020-07-16 DIAGNOSIS — E119 Type 2 diabetes mellitus without complications: Secondary | ICD-10-CM | POA: Diagnosis not present

## 2020-07-16 DIAGNOSIS — M1990 Unspecified osteoarthritis, unspecified site: Secondary | ICD-10-CM | POA: Diagnosis not present

## 2020-07-16 DIAGNOSIS — M1A9XX1 Chronic gout, unspecified, with tophus (tophi): Secondary | ICD-10-CM | POA: Diagnosis not present

## 2020-07-16 DIAGNOSIS — J45909 Unspecified asthma, uncomplicated: Secondary | ICD-10-CM | POA: Diagnosis not present

## 2020-07-16 DIAGNOSIS — M064 Inflammatory polyarthropathy: Secondary | ICD-10-CM | POA: Diagnosis not present

## 2020-07-18 DIAGNOSIS — R6889 Other general symptoms and signs: Secondary | ICD-10-CM | POA: Diagnosis not present

## 2020-07-18 DIAGNOSIS — M1A9XX1 Chronic gout, unspecified, with tophus (tophi): Secondary | ICD-10-CM | POA: Diagnosis not present

## 2020-07-23 DIAGNOSIS — F3341 Major depressive disorder, recurrent, in partial remission: Secondary | ICD-10-CM | POA: Diagnosis not present

## 2020-07-23 DIAGNOSIS — E7800 Pure hypercholesterolemia, unspecified: Secondary | ICD-10-CM | POA: Diagnosis not present

## 2020-07-23 DIAGNOSIS — K219 Gastro-esophageal reflux disease without esophagitis: Secondary | ICD-10-CM | POA: Diagnosis not present

## 2020-07-23 DIAGNOSIS — E1140 Type 2 diabetes mellitus with diabetic neuropathy, unspecified: Secondary | ICD-10-CM | POA: Diagnosis not present

## 2020-07-23 DIAGNOSIS — I10 Essential (primary) hypertension: Secondary | ICD-10-CM | POA: Diagnosis not present

## 2020-07-23 DIAGNOSIS — J45909 Unspecified asthma, uncomplicated: Secondary | ICD-10-CM | POA: Diagnosis not present

## 2020-07-23 DIAGNOSIS — J45901 Unspecified asthma with (acute) exacerbation: Secondary | ICD-10-CM | POA: Diagnosis not present

## 2020-07-23 DIAGNOSIS — E782 Mixed hyperlipidemia: Secondary | ICD-10-CM | POA: Diagnosis not present

## 2020-07-23 DIAGNOSIS — E1151 Type 2 diabetes mellitus with diabetic peripheral angiopathy without gangrene: Secondary | ICD-10-CM | POA: Diagnosis not present

## 2020-07-31 ENCOUNTER — Other Ambulatory Visit: Payer: Self-pay

## 2020-07-31 ENCOUNTER — Ambulatory Visit (INDEPENDENT_AMBULATORY_CARE_PROVIDER_SITE_OTHER): Payer: Medicare HMO | Admitting: Podiatry

## 2020-07-31 DIAGNOSIS — L84 Corns and callosities: Secondary | ICD-10-CM | POA: Diagnosis not present

## 2020-07-31 DIAGNOSIS — E1142 Type 2 diabetes mellitus with diabetic polyneuropathy: Secondary | ICD-10-CM | POA: Diagnosis not present

## 2020-07-31 NOTE — Progress Notes (Signed)
  Subjective:  Patient ID: Troy [name], male    DOB: 1965-08-26,  MRN: [id number]  No chief complaint on file.  55 y.o. male presents for f/u. Denies new complaints.  Objective:  Physical Exam: warm, good capillary refill, nail exam onychomycosis of the toenails, DP pulses palpable, PT pulses palpable and protective sensation absent Left Foot: Hx BKA  Right Foot: HPK submet 2, HAV with pre-ulcerative area 1st MPJ  No images are attached to the encounter.  Assessment:   1. DM type 2 with diabetic peripheral neuropathy (HCC)   2. Callus    Plan:  Patient was evaluated and treated and all questions answered.  Onychomycosis, Diabetes and DPN -Lesion again debrided. No active ulcerations noted. Will monitor closely given BKA left and PAD.  No follow-ups on file.

## 2020-08-01 DIAGNOSIS — Z79899 Other long term (current) drug therapy: Secondary | ICD-10-CM | POA: Diagnosis not present

## 2020-08-01 DIAGNOSIS — M1A9XX1 Chronic gout, unspecified, with tophus (tophi): Secondary | ICD-10-CM | POA: Diagnosis not present

## 2020-08-01 DIAGNOSIS — R6889 Other general symptoms and signs: Secondary | ICD-10-CM | POA: Diagnosis not present

## 2020-08-01 DIAGNOSIS — M064 Inflammatory polyarthropathy: Secondary | ICD-10-CM | POA: Diagnosis not present

## 2020-08-16 DIAGNOSIS — E119 Type 2 diabetes mellitus without complications: Secondary | ICD-10-CM | POA: Diagnosis not present

## 2020-08-16 DIAGNOSIS — Z79899 Other long term (current) drug therapy: Secondary | ICD-10-CM | POA: Diagnosis not present

## 2020-08-16 DIAGNOSIS — M1990 Unspecified osteoarthritis, unspecified site: Secondary | ICD-10-CM | POA: Diagnosis not present

## 2020-08-16 DIAGNOSIS — J45909 Unspecified asthma, uncomplicated: Secondary | ICD-10-CM | POA: Diagnosis not present

## 2020-08-16 DIAGNOSIS — R6889 Other general symptoms and signs: Secondary | ICD-10-CM | POA: Diagnosis not present

## 2020-08-16 DIAGNOSIS — M1A9XX1 Chronic gout, unspecified, with tophus (tophi): Secondary | ICD-10-CM | POA: Diagnosis not present

## 2020-08-23 DIAGNOSIS — J45909 Unspecified asthma, uncomplicated: Secondary | ICD-10-CM | POA: Diagnosis not present

## 2020-08-23 DIAGNOSIS — Z89512 Acquired absence of left leg below knee: Secondary | ICD-10-CM | POA: Diagnosis not present

## 2020-08-23 DIAGNOSIS — E1151 Type 2 diabetes mellitus with diabetic peripheral angiopathy without gangrene: Secondary | ICD-10-CM | POA: Diagnosis not present

## 2020-08-23 DIAGNOSIS — E7800 Pure hypercholesterolemia, unspecified: Secondary | ICD-10-CM | POA: Diagnosis not present

## 2020-08-23 DIAGNOSIS — Z0001 Encounter for general adult medical examination with abnormal findings: Secondary | ICD-10-CM | POA: Diagnosis not present

## 2020-08-23 DIAGNOSIS — M1A9XX1 Chronic gout, unspecified, with tophus (tophi): Secondary | ICD-10-CM | POA: Diagnosis not present

## 2020-08-23 DIAGNOSIS — N182 Chronic kidney disease, stage 2 (mild): Secondary | ICD-10-CM | POA: Diagnosis not present

## 2020-08-23 DIAGNOSIS — I10 Essential (primary) hypertension: Secondary | ICD-10-CM | POA: Diagnosis not present

## 2020-08-23 DIAGNOSIS — Z125 Encounter for screening for malignant neoplasm of prostate: Secondary | ICD-10-CM | POA: Diagnosis not present

## 2020-08-27 DIAGNOSIS — E782 Mixed hyperlipidemia: Secondary | ICD-10-CM | POA: Diagnosis not present

## 2020-08-27 DIAGNOSIS — F3341 Major depressive disorder, recurrent, in partial remission: Secondary | ICD-10-CM | POA: Diagnosis not present

## 2020-08-27 DIAGNOSIS — J45909 Unspecified asthma, uncomplicated: Secondary | ICD-10-CM | POA: Diagnosis not present

## 2020-08-27 DIAGNOSIS — E1151 Type 2 diabetes mellitus with diabetic peripheral angiopathy without gangrene: Secondary | ICD-10-CM | POA: Diagnosis not present

## 2020-08-27 DIAGNOSIS — E7800 Pure hypercholesterolemia, unspecified: Secondary | ICD-10-CM | POA: Diagnosis not present

## 2020-08-27 DIAGNOSIS — K219 Gastro-esophageal reflux disease without esophagitis: Secondary | ICD-10-CM | POA: Diagnosis not present

## 2020-08-27 DIAGNOSIS — I10 Essential (primary) hypertension: Secondary | ICD-10-CM | POA: Diagnosis not present

## 2020-08-27 DIAGNOSIS — E1140 Type 2 diabetes mellitus with diabetic neuropathy, unspecified: Secondary | ICD-10-CM | POA: Diagnosis not present

## 2020-08-27 DIAGNOSIS — J45901 Unspecified asthma with (acute) exacerbation: Secondary | ICD-10-CM | POA: Diagnosis not present

## 2020-08-28 ENCOUNTER — Other Ambulatory Visit: Payer: Self-pay

## 2020-08-28 ENCOUNTER — Encounter: Payer: Self-pay | Admitting: Podiatry

## 2020-08-28 ENCOUNTER — Ambulatory Visit: Payer: Medicare HMO | Admitting: Podiatry

## 2020-08-28 DIAGNOSIS — L84 Corns and callosities: Secondary | ICD-10-CM

## 2020-08-28 DIAGNOSIS — M21961 Unspecified acquired deformity of right lower leg: Secondary | ICD-10-CM

## 2020-08-28 DIAGNOSIS — E1142 Type 2 diabetes mellitus with diabetic polyneuropathy: Secondary | ICD-10-CM | POA: Diagnosis not present

## 2020-08-28 NOTE — Progress Notes (Signed)
  Subjective:  Patient ID: Troy [name], male    DOB: [birth year],  MRN: [id number]  Chief Complaint  Patient presents with  . Wound Check    Right foot wound check. Pt. Has no complaints.   55 y.o. male presents for f/u. Denies new complaints. Requests new DM shoes.  Objective:  Physical Exam: warm, good capillary refill, nail exam onychomycosis of the toenails, DP pulses palpable, PT pulses palpable and protective sensation absent Left Foot: Hx BKA  Right Foot: HPK submet 2, HAV with pre-ulcerative area 1st MPJ  No images are attached to the encounter.  Assessment:   1. DM type 2 with diabetic peripheral neuropathy (HCC)   2. Callus   3. Deformity of metatarsal bone of right foot    Plan:  Patient was evaluated and treated and all questions answered.  Onychomycosis, Diabetes and DPN -No active wound noted today. Would benefit from new DM shoes. Will make appt. Lesion gently pared. Tubefoam dispensed to offload the wound.  Return in about 4 weeks (around 09/25/2020) for at risk foot care.

## 2020-08-30 DIAGNOSIS — M1A9XX1 Chronic gout, unspecified, with tophus (tophi): Secondary | ICD-10-CM | POA: Diagnosis not present

## 2020-09-03 DIAGNOSIS — R6889 Other general symptoms and signs: Secondary | ICD-10-CM | POA: Diagnosis not present

## 2020-09-07 IMAGING — CR DG CHEST 2V
2 series · 2 of 2 positions shown · non-contrast
Comparison: 04/10/2017

CLINICAL DATA: Chest pain and shortness of breath

EXAM:
CHEST - 2 VIEW

[chest pa]
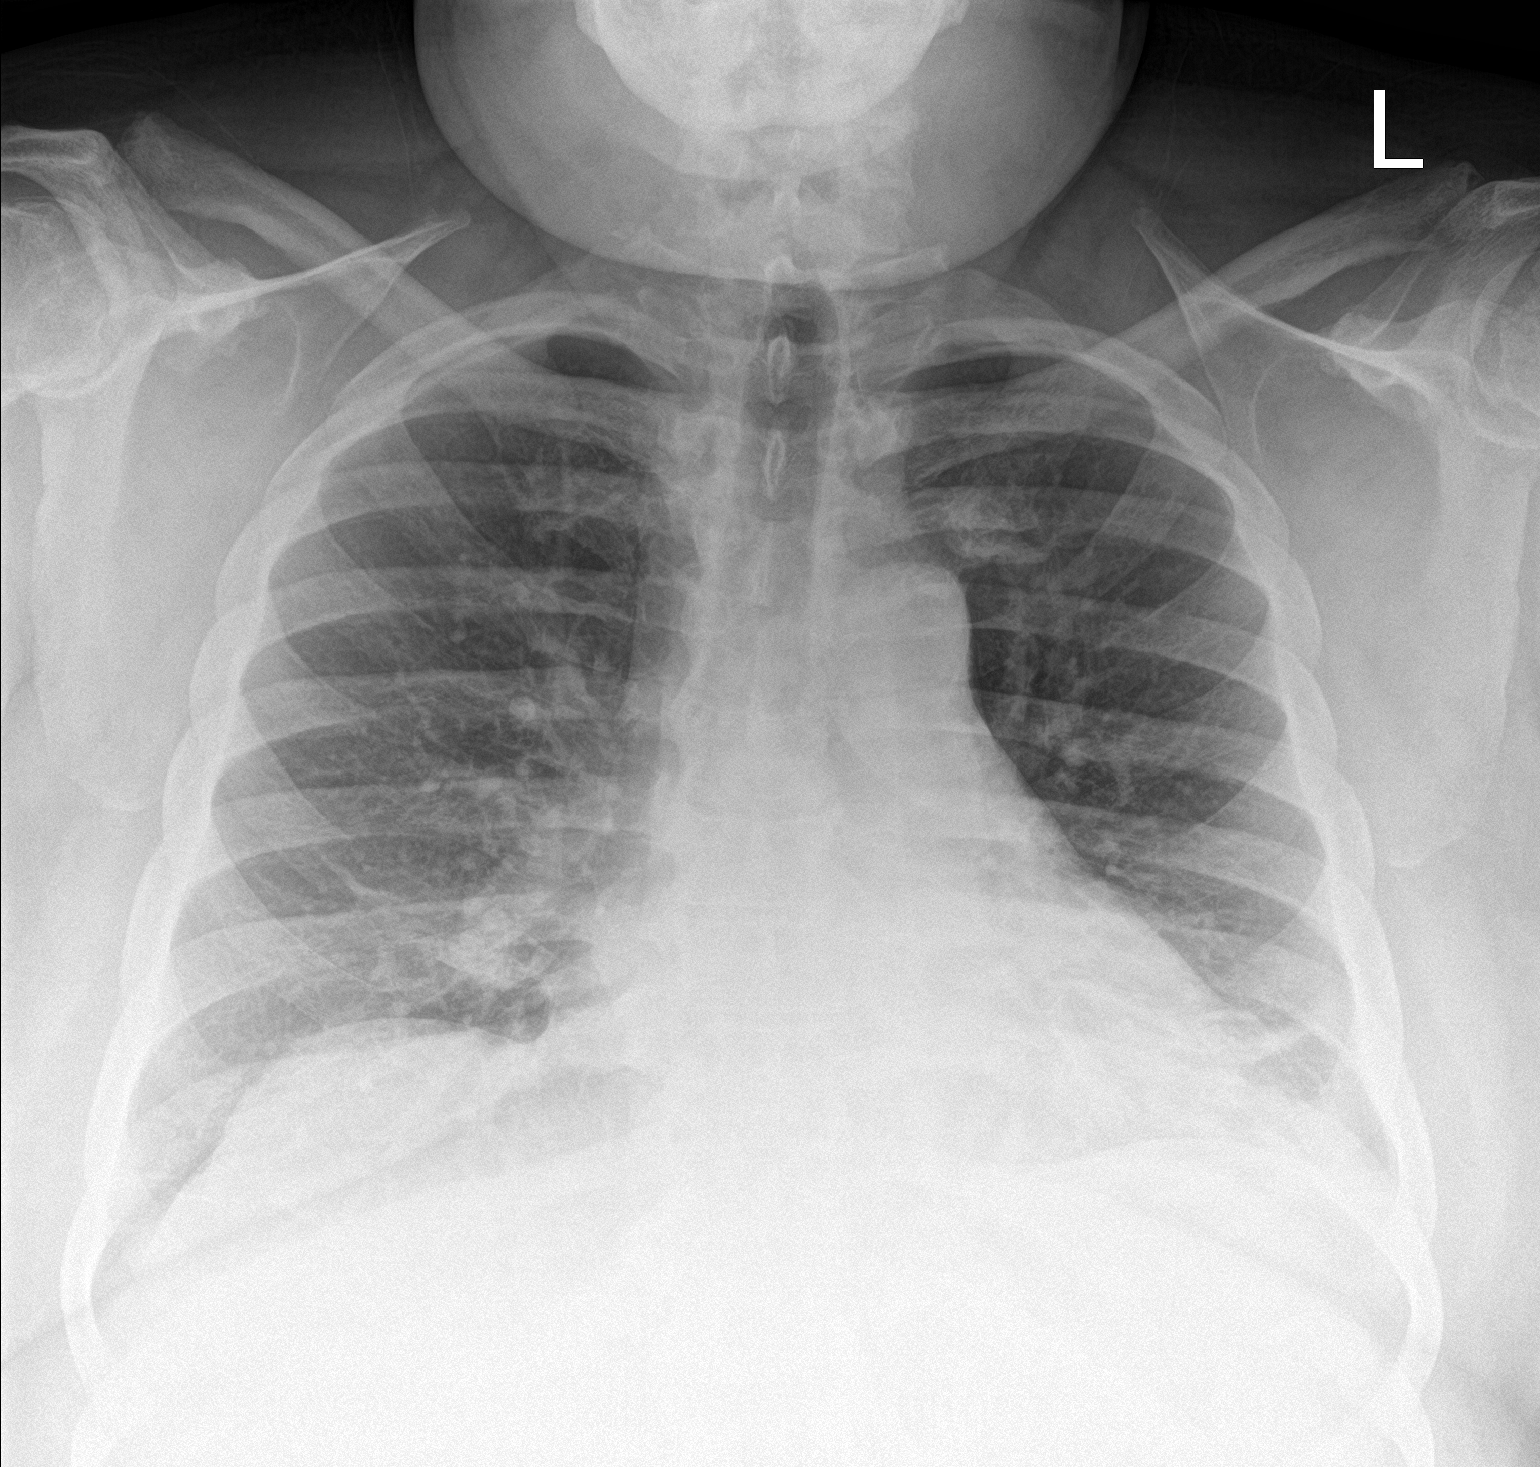

[chest lat]
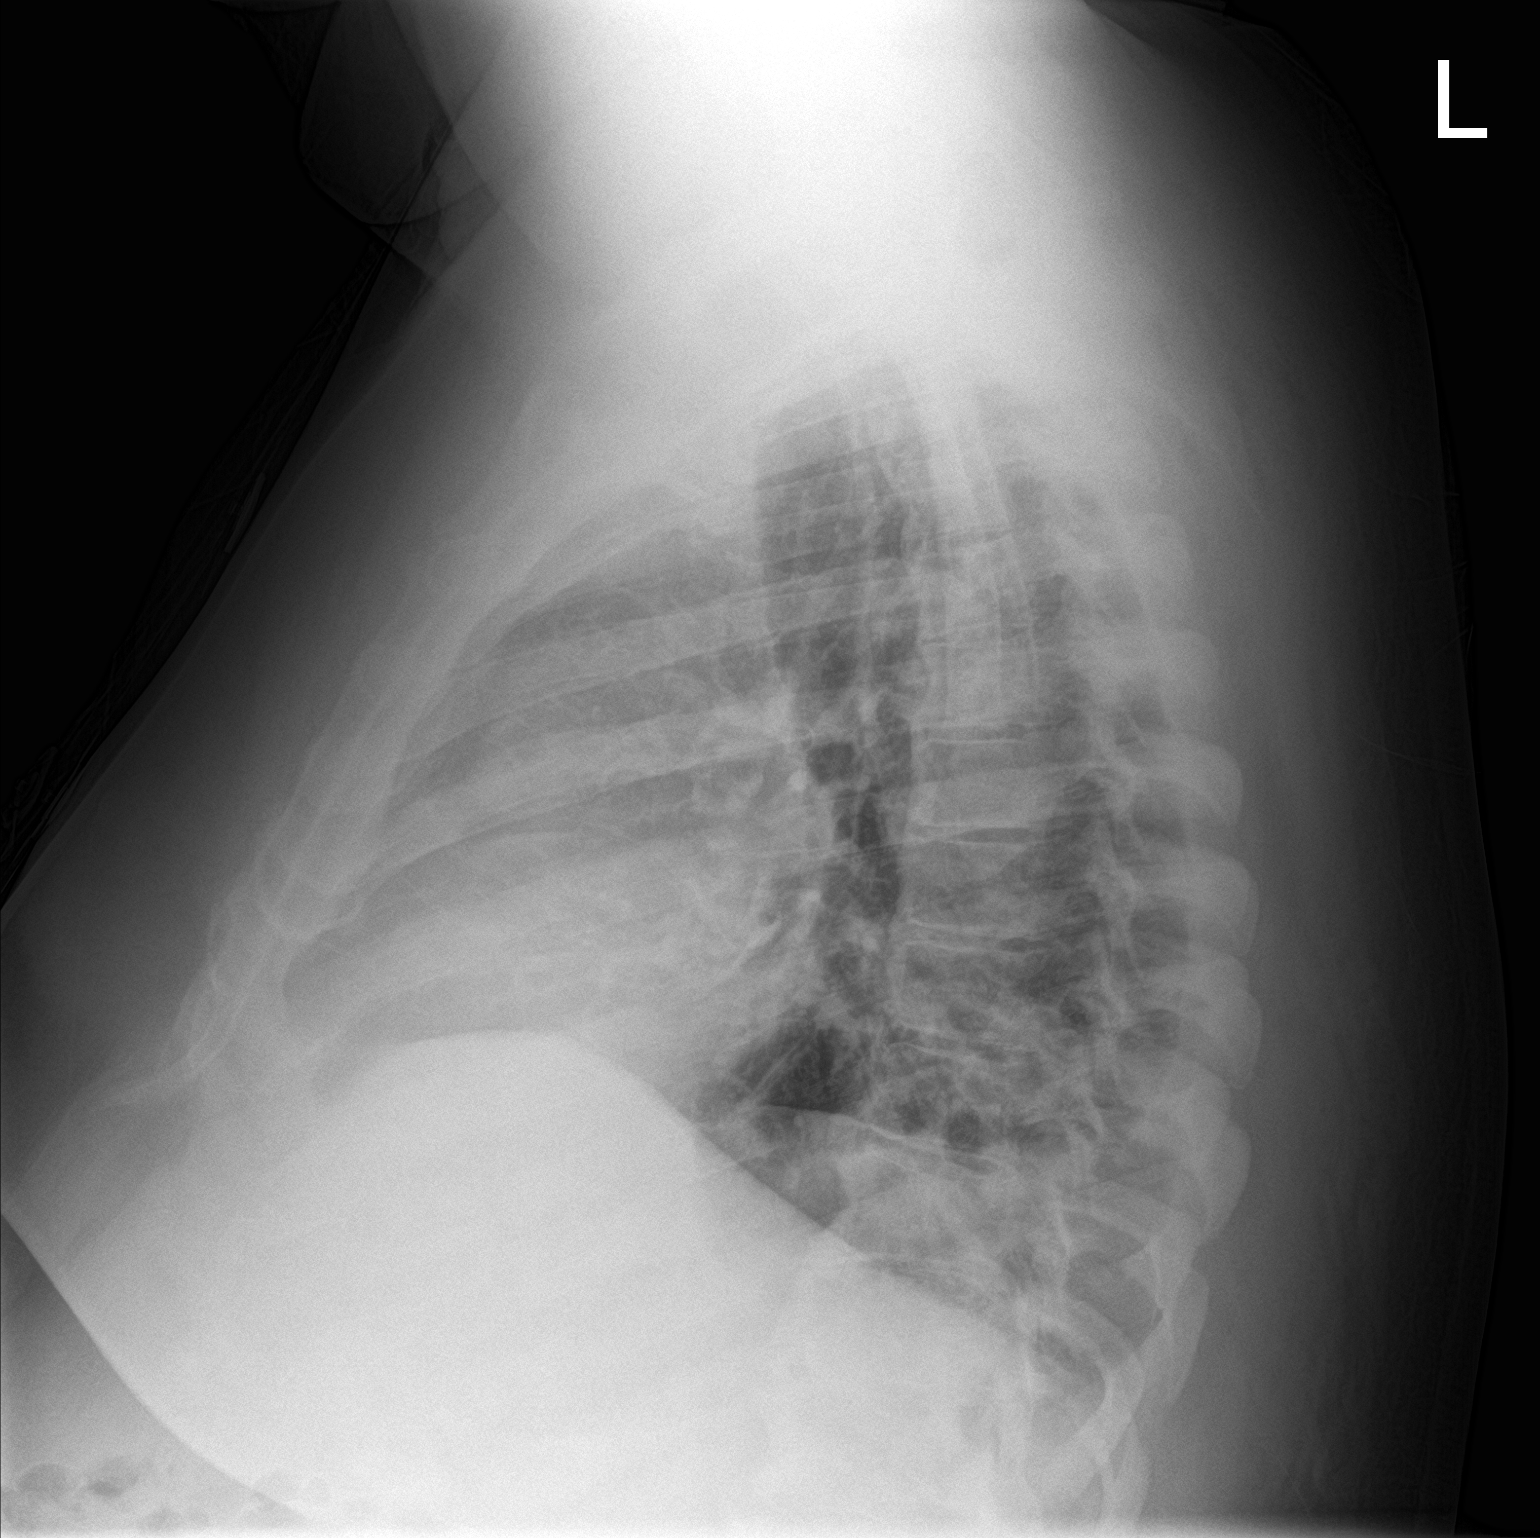

[2 of 2 positions shown; findings below may reference images not displayed]

FINDINGS: Cardiac shadows within normal limits. The lungs are well aerated
bilaterally. Mild left basilar atelectasis/infiltrate is seen. No
sizable effusion is noted. No bony abnormality is seen.
IMPRESSION: Left basilar infiltrate/atelectasis is noted.

## 2020-09-10 ENCOUNTER — Other Ambulatory Visit: Payer: Self-pay

## 2020-09-10 ENCOUNTER — Ambulatory Visit: Payer: Medicare HMO | Admitting: Orthotics

## 2020-09-10 DIAGNOSIS — L97411 Non-pressure chronic ulcer of right heel and midfoot limited to breakdown of skin: Secondary | ICD-10-CM

## 2020-09-10 DIAGNOSIS — M2011 Hallux valgus (acquired), right foot: Secondary | ICD-10-CM

## 2020-09-10 DIAGNOSIS — L84 Corns and callosities: Secondary | ICD-10-CM

## 2020-09-10 DIAGNOSIS — L89610 Pressure ulcer of right heel, unstageable: Secondary | ICD-10-CM

## 2020-09-10 DIAGNOSIS — L97419 Non-pressure chronic ulcer of right heel and midfoot with unspecified severity: Secondary | ICD-10-CM

## 2020-09-10 DIAGNOSIS — E08621 Diabetes mellitus due to underlying condition with foot ulcer: Secondary | ICD-10-CM

## 2020-09-10 DIAGNOSIS — I739 Peripheral vascular disease, unspecified: Secondary | ICD-10-CM

## 2020-09-10 DIAGNOSIS — E1142 Type 2 diabetes mellitus with diabetic polyneuropathy: Secondary | ICD-10-CM

## 2020-09-10 DIAGNOSIS — M21961 Unspecified acquired deformity of right lower leg: Secondary | ICD-10-CM

## 2020-09-10 NOTE — Progress Notes (Signed)
Patient was measured for med necessity extra depth shoes (x2) w/ 3 pair (x6) custom f/o. Patient was measured w/ brannock to determine size and width.  Foam impression mold was achieved and deemed appropriate for fabrication of  cmfo.   See DPM chart notes for further documentation and dx codes for determination of medical necessity.  Appropriate forms will be sent to PCP to verify and sign off on medical necessity.    ORDERED NB 813 13 4E  Docs only

## 2020-09-13 DIAGNOSIS — M1A9XX1 Chronic gout, unspecified, with tophus (tophi): Secondary | ICD-10-CM | POA: Diagnosis not present

## 2020-09-13 DIAGNOSIS — R6889 Other general symptoms and signs: Secondary | ICD-10-CM | POA: Diagnosis not present

## 2020-09-18 DIAGNOSIS — J45901 Unspecified asthma with (acute) exacerbation: Secondary | ICD-10-CM | POA: Diagnosis not present

## 2020-09-18 DIAGNOSIS — E782 Mixed hyperlipidemia: Secondary | ICD-10-CM | POA: Diagnosis not present

## 2020-09-18 DIAGNOSIS — J45909 Unspecified asthma, uncomplicated: Secondary | ICD-10-CM | POA: Diagnosis not present

## 2020-09-18 DIAGNOSIS — E1140 Type 2 diabetes mellitus with diabetic neuropathy, unspecified: Secondary | ICD-10-CM | POA: Diagnosis not present

## 2020-09-18 DIAGNOSIS — E7800 Pure hypercholesterolemia, unspecified: Secondary | ICD-10-CM | POA: Diagnosis not present

## 2020-09-18 DIAGNOSIS — K219 Gastro-esophageal reflux disease without esophagitis: Secondary | ICD-10-CM | POA: Diagnosis not present

## 2020-09-18 DIAGNOSIS — F3341 Major depressive disorder, recurrent, in partial remission: Secondary | ICD-10-CM | POA: Diagnosis not present

## 2020-09-18 DIAGNOSIS — E1151 Type 2 diabetes mellitus with diabetic peripheral angiopathy without gangrene: Secondary | ICD-10-CM | POA: Diagnosis not present

## 2020-09-18 DIAGNOSIS — I10 Essential (primary) hypertension: Secondary | ICD-10-CM | POA: Diagnosis not present

## 2020-09-25 ENCOUNTER — Ambulatory Visit (INDEPENDENT_AMBULATORY_CARE_PROVIDER_SITE_OTHER): Payer: Medicare HMO | Admitting: Podiatry

## 2020-09-25 ENCOUNTER — Other Ambulatory Visit: Payer: Self-pay

## 2020-09-25 DIAGNOSIS — M109 Gout, unspecified: Secondary | ICD-10-CM | POA: Insufficient documentation

## 2020-09-25 DIAGNOSIS — L84 Corns and callosities: Secondary | ICD-10-CM | POA: Diagnosis not present

## 2020-09-25 DIAGNOSIS — E1142 Type 2 diabetes mellitus with diabetic polyneuropathy: Secondary | ICD-10-CM

## 2020-09-25 NOTE — Progress Notes (Signed)
  Subjective:  Patient ID: ZAKI GERTSCH, male    DOB: 1966-06-17,  MRN: [id number]  Chief Complaint  Patient presents with  . Callouses    F/U Rt callus care -per pt callus is the same -pt denies pain/redness/swelling/drainage Tx: diabetic lotion    55 y.o. male presents for f/u. Denies new complaints.    Objective:  Physical Exam: warm, good capillary refill, nail exam onychomycosis of the toenails, DP pulses palpable, PT pulses palpable and protective sensation absent Left Foot: Hx BKA  Right Foot: HPK submet 2, HAV with pre-ulcerative area 1st MPJ  No images are attached to the encounter.  Assessment:   1. DM type 2 with diabetic peripheral neuropathy ([place])   2. Callus    Plan:  Patient was evaluated and treated and all questions answered.  Onychomycosis, Diabetes and DPN  -Awaiting new DM shoes. -Lesion debrided courtesy no new open area. He does report recent increased pressure over the 1st MPJ. This is also without ulcer today. F/u in 1 month for recheck.  No follow-ups on file.

## 2020-09-26 DIAGNOSIS — G4733 Obstructive sleep apnea (adult) (pediatric): Secondary | ICD-10-CM | POA: Diagnosis not present

## 2020-09-27 DIAGNOSIS — M1A9XX1 Chronic gout, unspecified, with tophus (tophi): Secondary | ICD-10-CM | POA: Diagnosis not present

## 2020-10-09 ENCOUNTER — Other Ambulatory Visit (HOSPITAL_COMMUNITY): Payer: Self-pay | Admitting: Optometry

## 2020-10-09 DIAGNOSIS — M1A9XX1 Chronic gout, unspecified, with tophus (tophi): Secondary | ICD-10-CM | POA: Diagnosis not present

## 2020-10-09 DIAGNOSIS — E113513 Type 2 diabetes mellitus with proliferative diabetic retinopathy with macular edema, bilateral: Secondary | ICD-10-CM | POA: Diagnosis not present

## 2020-10-09 DIAGNOSIS — Z961 Presence of intraocular lens: Secondary | ICD-10-CM | POA: Diagnosis not present

## 2020-10-09 DIAGNOSIS — H40053 Ocular hypertension, bilateral: Secondary | ICD-10-CM | POA: Diagnosis not present

## 2020-10-09 DIAGNOSIS — H3582 Retinal ischemia: Secondary | ICD-10-CM | POA: Diagnosis not present

## 2020-10-11 DIAGNOSIS — M1A9XX1 Chronic gout, unspecified, with tophus (tophi): Secondary | ICD-10-CM | POA: Diagnosis not present

## 2020-10-15 DIAGNOSIS — J45909 Unspecified asthma, uncomplicated: Secondary | ICD-10-CM | POA: Diagnosis not present

## 2020-10-15 DIAGNOSIS — M1A9XX1 Chronic gout, unspecified, with tophus (tophi): Secondary | ICD-10-CM | POA: Diagnosis not present

## 2020-10-15 DIAGNOSIS — Z79899 Other long term (current) drug therapy: Secondary | ICD-10-CM | POA: Diagnosis not present

## 2020-10-15 DIAGNOSIS — M1990 Unspecified osteoarthritis, unspecified site: Secondary | ICD-10-CM | POA: Diagnosis not present

## 2020-10-15 DIAGNOSIS — E119 Type 2 diabetes mellitus without complications: Secondary | ICD-10-CM | POA: Diagnosis not present

## 2020-10-23 DIAGNOSIS — M1A9XX1 Chronic gout, unspecified, with tophus (tophi): Secondary | ICD-10-CM | POA: Diagnosis not present

## 2020-10-25 DIAGNOSIS — M1A9XX1 Chronic gout, unspecified, with tophus (tophi): Secondary | ICD-10-CM | POA: Diagnosis not present

## 2020-10-26 ENCOUNTER — Ambulatory Visit (INDEPENDENT_AMBULATORY_CARE_PROVIDER_SITE_OTHER): Payer: Medicare HMO | Admitting: Podiatry

## 2020-10-26 ENCOUNTER — Other Ambulatory Visit: Payer: Self-pay

## 2020-10-26 ENCOUNTER — Other Ambulatory Visit (HOSPITAL_COMMUNITY): Payer: Self-pay | Admitting: Rheumatology

## 2020-10-26 DIAGNOSIS — M21961 Unspecified acquired deformity of right lower leg: Secondary | ICD-10-CM | POA: Diagnosis not present

## 2020-10-26 DIAGNOSIS — E1142 Type 2 diabetes mellitus with diabetic polyneuropathy: Secondary | ICD-10-CM | POA: Diagnosis not present

## 2020-10-26 DIAGNOSIS — Z89432 Acquired absence of left foot: Secondary | ICD-10-CM | POA: Diagnosis not present

## 2020-10-26 DIAGNOSIS — M216X1 Other acquired deformities of right foot: Secondary | ICD-10-CM | POA: Diagnosis not present

## 2020-10-26 DIAGNOSIS — L84 Corns and callosities: Secondary | ICD-10-CM | POA: Diagnosis not present

## 2020-10-26 DIAGNOSIS — M2011 Hallux valgus (acquired), right foot: Secondary | ICD-10-CM | POA: Diagnosis not present

## 2020-10-26 NOTE — Progress Notes (Signed)
  Subjective:  Patient ID: Troy [name], male    DOB: [birth year],  MRN: [id number]  No chief complaint on file.  55 y.o. male presents for f/u. Denies new complaints.    Objective:  Physical Exam: warm, good capillary refill, nail exam onychomycosis of the toenails, DP pulses palpable, PT pulses palpable and protective sensation absent Left Foot: Hx BKA  Right Foot: HPK submet 2, HAV with pre-ulcerative area 1st MPJ  No images are attached to the encounter.  Assessment:   1. Deformity of metatarsal bone of right foot   2. DM type 2 with diabetic peripheral neuropathy (HCC)   3. Callus    Plan:  Patient was evaluated and treated and all questions answered.  Onychomycosis, Diabetes and DPN  -New DM shoes dispsensed today. Went over instructions for break in an monitoring both in person and written instructions provided. Patient signed copy of instructions confirming receipt. -Pre-ulcerative lesions gently debrided. No open ulceration.  Return in about 4 weeks (around 11/23/2020) for Wound check.

## 2020-10-28 ENCOUNTER — Other Ambulatory Visit (HOSPITAL_COMMUNITY): Payer: Self-pay

## 2020-10-28 MED FILL — Methotrexate Sodium Tab 2.5 MG (Base Equiv): ORAL | 28 days supply | Qty: 16 | Fill #0 | Status: AC

## 2020-10-28 MED FILL — Fluticasone Propionate Nasal Susp 50 MCG/ACT: NASAL | 60 days supply | Qty: 16 | Fill #0 | Status: AC

## 2020-10-28 MED FILL — Folic Acid Tab 1 MG: ORAL | 30 days supply | Qty: 30 | Fill #0 | Status: AC

## 2020-11-06 DIAGNOSIS — M1A9XX1 Chronic gout, unspecified, with tophus (tophi): Secondary | ICD-10-CM | POA: Diagnosis not present

## 2020-11-08 DIAGNOSIS — M1A9XX1 Chronic gout, unspecified, with tophus (tophi): Secondary | ICD-10-CM | POA: Diagnosis not present

## 2020-11-19 ENCOUNTER — Other Ambulatory Visit (HOSPITAL_COMMUNITY): Payer: Self-pay

## 2020-11-19 MED FILL — Dorzolamide HCl-Timolol Maleate Ophth Soln 2-0.5%: OPHTHALMIC | 38 days supply | Qty: 10 | Fill #0 | Status: CN

## 2020-11-19 MED FILL — Dorzolamide HCl-Timolol Maleate Ophth Soln 2-0.5%: OPHTHALMIC | 50 days supply | Qty: 10 | Fill #0 | Status: AC

## 2020-11-20 DIAGNOSIS — E7800 Pure hypercholesterolemia, unspecified: Secondary | ICD-10-CM | POA: Diagnosis not present

## 2020-11-20 DIAGNOSIS — I10 Essential (primary) hypertension: Secondary | ICD-10-CM | POA: Diagnosis not present

## 2020-11-20 DIAGNOSIS — F3341 Major depressive disorder, recurrent, in partial remission: Secondary | ICD-10-CM | POA: Diagnosis not present

## 2020-11-20 DIAGNOSIS — K219 Gastro-esophageal reflux disease without esophagitis: Secondary | ICD-10-CM | POA: Diagnosis not present

## 2020-11-20 DIAGNOSIS — J45909 Unspecified asthma, uncomplicated: Secondary | ICD-10-CM | POA: Diagnosis not present

## 2020-11-20 DIAGNOSIS — E1140 Type 2 diabetes mellitus with diabetic neuropathy, unspecified: Secondary | ICD-10-CM | POA: Diagnosis not present

## 2020-11-20 DIAGNOSIS — J45901 Unspecified asthma with (acute) exacerbation: Secondary | ICD-10-CM | POA: Diagnosis not present

## 2020-11-20 DIAGNOSIS — Z961 Presence of intraocular lens: Secondary | ICD-10-CM | POA: Diagnosis not present

## 2020-11-20 DIAGNOSIS — E782 Mixed hyperlipidemia: Secondary | ICD-10-CM | POA: Diagnosis not present

## 2020-11-20 DIAGNOSIS — E113513 Type 2 diabetes mellitus with proliferative diabetic retinopathy with macular edema, bilateral: Secondary | ICD-10-CM | POA: Diagnosis not present

## 2020-11-20 DIAGNOSIS — H3582 Retinal ischemia: Secondary | ICD-10-CM | POA: Diagnosis not present

## 2020-11-20 DIAGNOSIS — E1151 Type 2 diabetes mellitus with diabetic peripheral angiopathy without gangrene: Secondary | ICD-10-CM | POA: Diagnosis not present

## 2020-11-20 DIAGNOSIS — H40053 Ocular hypertension, bilateral: Secondary | ICD-10-CM | POA: Diagnosis not present

## 2020-11-23 DIAGNOSIS — M1A9XX1 Chronic gout, unspecified, with tophus (tophi): Secondary | ICD-10-CM | POA: Diagnosis not present

## 2020-11-26 DIAGNOSIS — R6889 Other general symptoms and signs: Secondary | ICD-10-CM | POA: Diagnosis not present

## 2020-11-26 DIAGNOSIS — M1A9XX1 Chronic gout, unspecified, with tophus (tophi): Secondary | ICD-10-CM | POA: Diagnosis not present

## 2020-11-27 ENCOUNTER — Other Ambulatory Visit: Payer: Self-pay

## 2020-11-27 ENCOUNTER — Ambulatory Visit (INDEPENDENT_AMBULATORY_CARE_PROVIDER_SITE_OTHER): Payer: Medicare HMO

## 2020-11-27 ENCOUNTER — Ambulatory Visit (INDEPENDENT_AMBULATORY_CARE_PROVIDER_SITE_OTHER): Payer: Medicare HMO | Admitting: Podiatry

## 2020-11-27 DIAGNOSIS — M79671 Pain in right foot: Secondary | ICD-10-CM

## 2020-11-27 DIAGNOSIS — L97511 Non-pressure chronic ulcer of other part of right foot limited to breakdown of skin: Secondary | ICD-10-CM | POA: Diagnosis not present

## 2020-11-27 DIAGNOSIS — M722 Plantar fascial fibromatosis: Secondary | ICD-10-CM | POA: Diagnosis not present

## 2020-11-27 NOTE — Progress Notes (Signed)
  Subjective:  Patient ID: Troy [name], male    DOB: Nov 06, 1965,  MRN: [id number]  Chief Complaint  Patient presents with  . Wound Check    Reports some bleeding from area last night, no longer today. Denies any f/c/n/v.   . Foot Problem    Reports new onset heel pains since this past Saturday, no injury.    55 y.o. male presents for f/u. Denies new complaints.    Objective:  Physical Exam: warm, good capillary refill, nail exam onychomycosis of the toenails, DP pulses palpable, PT pulses palpable and protective sensation absent Left Foot: Hx BKA  Right Foot: HPK submet 2, HAV with pre-ulcerative area 1st MPJ; small   No images are attached to the encounter.  Assessment:   1. Plantar fasciitis of right foot   2. Ulcer of great toe, right, limited to breakdown of skin [facility])    Plan:  Patient was evaluated and treated and all questions answered.  Wound right great toe sulcus -Dressed with povidone and DSD  -Dispensed additional bunion shields with care to make sure this would not irritate the wound. Plan for fabrication of silicone pad next visit to prevent pressure at the area.  Return in about 2 weeks (around 12/11/2020) for Wound Care, Right.

## 2020-11-30 ENCOUNTER — Other Ambulatory Visit (HOSPITAL_COMMUNITY): Payer: Self-pay

## 2020-12-03 ENCOUNTER — Other Ambulatory Visit: Payer: Self-pay | Admitting: Podiatry

## 2020-12-03 DIAGNOSIS — M722 Plantar fascial fibromatosis: Secondary | ICD-10-CM

## 2020-12-07 ENCOUNTER — Other Ambulatory Visit: Payer: Self-pay | Admitting: Gastroenterology

## 2020-12-07 DIAGNOSIS — Z86010 Personal history of colonic polyps: Secondary | ICD-10-CM | POA: Diagnosis not present

## 2020-12-07 DIAGNOSIS — R1012 Left upper quadrant pain: Secondary | ICD-10-CM | POA: Diagnosis not present

## 2020-12-10 DIAGNOSIS — R6889 Other general symptoms and signs: Secondary | ICD-10-CM | POA: Diagnosis not present

## 2020-12-10 DIAGNOSIS — M1A9XX1 Chronic gout, unspecified, with tophus (tophi): Secondary | ICD-10-CM | POA: Diagnosis not present

## 2020-12-12 DIAGNOSIS — R6889 Other general symptoms and signs: Secondary | ICD-10-CM | POA: Diagnosis not present

## 2020-12-12 DIAGNOSIS — E113513 Type 2 diabetes mellitus with proliferative diabetic retinopathy with macular edema, bilateral: Secondary | ICD-10-CM | POA: Diagnosis not present

## 2020-12-12 DIAGNOSIS — H40053 Ocular hypertension, bilateral: Secondary | ICD-10-CM | POA: Diagnosis not present

## 2020-12-14 ENCOUNTER — Ambulatory Visit (INDEPENDENT_AMBULATORY_CARE_PROVIDER_SITE_OTHER): Payer: Medicare HMO | Admitting: Podiatry

## 2020-12-14 ENCOUNTER — Other Ambulatory Visit: Payer: Self-pay

## 2020-12-14 ENCOUNTER — Other Ambulatory Visit (HOSPITAL_COMMUNITY): Payer: Self-pay

## 2020-12-14 DIAGNOSIS — L97511 Non-pressure chronic ulcer of other part of right foot limited to breakdown of skin: Secondary | ICD-10-CM | POA: Diagnosis not present

## 2020-12-14 DIAGNOSIS — R6889 Other general symptoms and signs: Secondary | ICD-10-CM | POA: Diagnosis not present

## 2020-12-14 DIAGNOSIS — E7800 Pure hypercholesterolemia, unspecified: Secondary | ICD-10-CM | POA: Diagnosis not present

## 2020-12-14 DIAGNOSIS — M21961 Unspecified acquired deformity of right lower leg: Secondary | ICD-10-CM

## 2020-12-14 DIAGNOSIS — F3341 Major depressive disorder, recurrent, in partial remission: Secondary | ICD-10-CM | POA: Diagnosis not present

## 2020-12-14 DIAGNOSIS — N182 Chronic kidney disease, stage 2 (mild): Secondary | ICD-10-CM | POA: Diagnosis not present

## 2020-12-14 DIAGNOSIS — E782 Mixed hyperlipidemia: Secondary | ICD-10-CM | POA: Diagnosis not present

## 2020-12-14 DIAGNOSIS — K219 Gastro-esophageal reflux disease without esophagitis: Secondary | ICD-10-CM | POA: Diagnosis not present

## 2020-12-14 DIAGNOSIS — I10 Essential (primary) hypertension: Secondary | ICD-10-CM | POA: Diagnosis not present

## 2020-12-14 DIAGNOSIS — J45909 Unspecified asthma, uncomplicated: Secondary | ICD-10-CM | POA: Diagnosis not present

## 2020-12-14 DIAGNOSIS — E1140 Type 2 diabetes mellitus with diabetic neuropathy, unspecified: Secondary | ICD-10-CM | POA: Diagnosis not present

## 2020-12-14 DIAGNOSIS — J45901 Unspecified asthma with (acute) exacerbation: Secondary | ICD-10-CM | POA: Diagnosis not present

## 2020-12-14 MED ORDER — SILVER SULFADIAZINE 1 % EX CREA
TOPICAL_CREAM | CUTANEOUS | 0 refills | Status: DC
Start: 2020-12-14 — End: 2021-04-16
  Filled 2020-12-14: qty 50, 30d supply, fill #0

## 2020-12-14 MED FILL — Folic Acid Tab 1 MG: ORAL | 30 days supply | Qty: 30 | Fill #1 | Status: AC

## 2020-12-14 NOTE — Progress Notes (Signed)
  Subjective:  Patient ID: Troy [name], male    DOB: 08/26/1965,  MRN: [id number]  Chief Complaint  Patient presents with  . Wound Check    Pt denies fever/nausea/vomiting/chills and states no new concerns.    55 y.o. male presents for f/u. Denies new complaints.    Objective:  Physical Exam: warm, good capillary refill, nail exam onychomycosis of the toenails, DP pulses palpable, PT pulses palpable and protective sensation absent Left Foot: Hx BKA  Right Foot: HPK submet 2, HAV with small ulcer medial 1st MPJ area; healing fissure lateral 1st toe,  No images are attached to the encounter.  Assessment:   1. Ulcer of great toe, right, limited to breakdown of skin ([place])   2. Deformity of metatarsal bone of right foot    Plan:  Patient was evaluated and treated and all questions answered.  Wound right great toe sulcus; medial bunion -Dressed with silvadene and band-aidand -Silicone bunion shield fabricated for patient with pediplast.  No follow-ups on file.

## 2020-12-26 DIAGNOSIS — G4733 Obstructive sleep apnea (adult) (pediatric): Secondary | ICD-10-CM | POA: Diagnosis not present

## 2020-12-27 DIAGNOSIS — M1A9XX1 Chronic gout, unspecified, with tophus (tophi): Secondary | ICD-10-CM | POA: Diagnosis not present

## 2020-12-27 DIAGNOSIS — R6889 Other general symptoms and signs: Secondary | ICD-10-CM | POA: Diagnosis not present

## 2020-12-28 ENCOUNTER — Ambulatory Visit (INDEPENDENT_AMBULATORY_CARE_PROVIDER_SITE_OTHER): Payer: Medicare HMO | Admitting: Podiatrist

## 2020-12-28 ENCOUNTER — Other Ambulatory Visit (HOSPITAL_COMMUNITY): Payer: Self-pay

## 2020-12-28 ENCOUNTER — Other Ambulatory Visit: Payer: Self-pay

## 2020-12-28 DIAGNOSIS — L97419 Non-pressure chronic ulcer of right heel and midfoot with unspecified severity: Secondary | ICD-10-CM | POA: Diagnosis not present

## 2020-12-28 DIAGNOSIS — E08621 Diabetes mellitus due to underlying condition with foot ulcer: Secondary | ICD-10-CM

## 2020-12-28 DIAGNOSIS — R6889 Other general symptoms and signs: Secondary | ICD-10-CM | POA: Diagnosis not present

## 2020-12-28 DIAGNOSIS — L89891 Pressure ulcer of other site, stage 1: Secondary | ICD-10-CM

## 2020-12-28 DIAGNOSIS — L97511 Non-pressure chronic ulcer of other part of right foot limited to breakdown of skin: Secondary | ICD-10-CM

## 2020-12-28 DIAGNOSIS — M21961 Unspecified acquired deformity of right lower leg: Secondary | ICD-10-CM

## 2020-12-28 DIAGNOSIS — L84 Corns and callosities: Secondary | ICD-10-CM

## 2020-12-28 DIAGNOSIS — E11621 Type 2 diabetes mellitus with foot ulcer: Secondary | ICD-10-CM

## 2020-12-28 MED FILL — Dorzolamide HCl-Timolol Maleate Ophth Soln 2-0.5%: OPHTHALMIC | 50 days supply | Qty: 10 | Fill #1 | Status: AC

## 2020-12-28 NOTE — Progress Notes (Signed)
    Chief Complaint  Patient presents with  . Toe Ulcer    Follow up ulcer of right great toe. Pt states improvement.      HPI: Patient is 55 y.o. male who presents today for follow up of ulcer of the right great toe.  He states he thinks it has improved.  He has been using the silvadene cream and covering with a dressing as instructed by Dr. March Rummage.  Patient has a history of a BKA on the right.      No Known Allergies  Review of Systems No fevers, chills, nausea, muscle aches, no difficulty breathing, no calf pain, no chest pain or shortness of breath.   Physical Exam  GENERAL APPEARANCE: Alert, conversant. Appropriately groomed. No acute distress.   VASCULAR: Pedal pulses palpable DP and PT right.   Proximal to distal cooling it warm to warm.  Digital perfusion adequate.   NEUROLOGIC:  Protective sensation absent- right.   MUSCULOSKELETAL: acceptable muscle strength, tone and stability right.  Hallux abducto valgus deformity is present on the right.  Left foot BKA.  DERMATOLOGIC: Ulceration appears healed at medial first met right.  Hyperkeratotic tissue is present and once pared reveals intact integument.  hpk submet 2 present right.  Healing fissue lateral first toe.    Assessment    ICD-10-CM   1. Type 2 diabetes mellitus with pressure ulcer of toe, stage 1 (HCC)  E11.621    L89.891   2. Deformity of metatarsal bone of right foot  M21.961   3. Callus  L84      Plan  - patient examined.  - I carefully pared the area of the ulceration/ callus with a 15 blade to reveal intact skin. - recommended he continue to wear the pediplast silicone bunion shield Dr. March Rummage made for him.   - He will return for follow up with Dr. March Rummage and will call sooner if any changes are noticed or any concerns arise.

## 2021-01-01 ENCOUNTER — Encounter: Payer: Self-pay | Admitting: Podiatrist

## 2021-01-09 DIAGNOSIS — M1A9XX1 Chronic gout, unspecified, with tophus (tophi): Secondary | ICD-10-CM | POA: Diagnosis not present

## 2021-01-10 DIAGNOSIS — M1A9XX1 Chronic gout, unspecified, with tophus (tophi): Secondary | ICD-10-CM | POA: Diagnosis not present

## 2021-01-10 MED FILL — Folic Acid Tab 1 MG: ORAL | 30 days supply | Qty: 30 | Fill #2 | Status: AC

## 2021-01-11 ENCOUNTER — Other Ambulatory Visit (HOSPITAL_COMMUNITY): Payer: Self-pay

## 2021-01-14 ENCOUNTER — Other Ambulatory Visit (HOSPITAL_COMMUNITY): Payer: Self-pay

## 2021-01-15 ENCOUNTER — Other Ambulatory Visit (HOSPITAL_COMMUNITY): Payer: Self-pay

## 2021-01-15 DIAGNOSIS — M1990 Unspecified osteoarthritis, unspecified site: Secondary | ICD-10-CM | POA: Diagnosis not present

## 2021-01-15 DIAGNOSIS — Z79899 Other long term (current) drug therapy: Secondary | ICD-10-CM | POA: Diagnosis not present

## 2021-01-15 DIAGNOSIS — J45909 Unspecified asthma, uncomplicated: Secondary | ICD-10-CM | POA: Diagnosis not present

## 2021-01-15 DIAGNOSIS — M1A9XX1 Chronic gout, unspecified, with tophus (tophi): Secondary | ICD-10-CM | POA: Diagnosis not present

## 2021-01-15 DIAGNOSIS — M25569 Pain in unspecified knee: Secondary | ICD-10-CM | POA: Diagnosis not present

## 2021-01-15 DIAGNOSIS — E119 Type 2 diabetes mellitus without complications: Secondary | ICD-10-CM | POA: Diagnosis not present

## 2021-01-18 ENCOUNTER — Ambulatory Visit (INDEPENDENT_AMBULATORY_CARE_PROVIDER_SITE_OTHER): Payer: Medicare HMO | Admitting: Podiatry

## 2021-01-18 ENCOUNTER — Other Ambulatory Visit: Payer: Self-pay

## 2021-01-18 DIAGNOSIS — E11621 Type 2 diabetes mellitus with foot ulcer: Secondary | ICD-10-CM | POA: Diagnosis not present

## 2021-01-18 DIAGNOSIS — L89891 Pressure ulcer of other site, stage 1: Secondary | ICD-10-CM

## 2021-01-18 DIAGNOSIS — M21961 Unspecified acquired deformity of right lower leg: Secondary | ICD-10-CM

## 2021-01-18 NOTE — Progress Notes (Signed)
  Subjective:  Patient ID: Troy [name], male    DOB: [birth year],  MRN: [id number]  Chief Complaint  Patient presents with   Wound Check    No new concerns, denies fever/chills/nausea/vomiting.   55 y.o. male presents for f/u. Denies new complaints.    Objective:  Physical Exam: warm, good capillary refill, nail exam onychomycosis of the toenails, DP pulses palpable, PT pulses palpable and protective sensation absent Left Foot: Hx BKA  Right Foot: HPK submet 2, HAV healing fissure lateral 1st toe, resolving ulcer 1st met head medially.  No images are attached to the encounter.  Assessment:   1. Type 2 diabetes mellitus with pressure ulcer of toe, stage 1 (HCC)   2. Deformity of metatarsal bone of right foot     Plan:  Patient was evaluated and treated and all questions answered.  Wound right great toe sulcus; medial bunion -Wound resolved, pressure HPK returned. Offloaded with tubefoam bunion shield today. -Unable to fabricate new silicone spacer today - will order and have for next visit.  No follow-ups on file.

## 2021-01-21 DIAGNOSIS — K219 Gastro-esophageal reflux disease without esophagitis: Secondary | ICD-10-CM | POA: Diagnosis not present

## 2021-01-21 DIAGNOSIS — E782 Mixed hyperlipidemia: Secondary | ICD-10-CM | POA: Diagnosis not present

## 2021-01-21 DIAGNOSIS — J45901 Unspecified asthma with (acute) exacerbation: Secondary | ICD-10-CM | POA: Diagnosis not present

## 2021-01-21 DIAGNOSIS — F3341 Major depressive disorder, recurrent, in partial remission: Secondary | ICD-10-CM | POA: Diagnosis not present

## 2021-01-21 DIAGNOSIS — N182 Chronic kidney disease, stage 2 (mild): Secondary | ICD-10-CM | POA: Diagnosis not present

## 2021-01-21 DIAGNOSIS — J45909 Unspecified asthma, uncomplicated: Secondary | ICD-10-CM | POA: Diagnosis not present

## 2021-01-21 DIAGNOSIS — E7800 Pure hypercholesterolemia, unspecified: Secondary | ICD-10-CM | POA: Diagnosis not present

## 2021-01-21 DIAGNOSIS — I10 Essential (primary) hypertension: Secondary | ICD-10-CM | POA: Diagnosis not present

## 2021-01-21 DIAGNOSIS — E1140 Type 2 diabetes mellitus with diabetic neuropathy, unspecified: Secondary | ICD-10-CM | POA: Diagnosis not present

## 2021-01-31 DIAGNOSIS — M1A9XX1 Chronic gout, unspecified, with tophus (tophi): Secondary | ICD-10-CM | POA: Diagnosis not present

## 2021-01-31 DIAGNOSIS — M25561 Pain in right knee: Secondary | ICD-10-CM | POA: Diagnosis not present

## 2021-02-01 ENCOUNTER — Ambulatory Visit (INDEPENDENT_AMBULATORY_CARE_PROVIDER_SITE_OTHER): Payer: Medicare HMO | Admitting: Podiatry

## 2021-02-01 ENCOUNTER — Other Ambulatory Visit: Payer: Self-pay

## 2021-02-01 DIAGNOSIS — L89891 Pressure ulcer of other site, stage 1: Secondary | ICD-10-CM | POA: Diagnosis not present

## 2021-02-01 DIAGNOSIS — L84 Corns and callosities: Secondary | ICD-10-CM | POA: Diagnosis not present

## 2021-02-01 DIAGNOSIS — E11621 Type 2 diabetes mellitus with foot ulcer: Secondary | ICD-10-CM | POA: Diagnosis not present

## 2021-02-01 DIAGNOSIS — M21961 Unspecified acquired deformity of right lower leg: Secondary | ICD-10-CM

## 2021-02-01 NOTE — Progress Notes (Signed)
  Subjective:  Patient ID: Troy [name], male    DOB: 25-May-1966,  MRN: [id number]  Chief Complaint  Patient presents with   Wound Check    Denies fever/chills/nausea/vomiting. No new concerns.   55 y.o. male presents for f/u. Denies new complaints.    Objective:  Physical Exam: warm, good capillary refill, nail exam onychomycosis of the toenails, DP pulses palpable, PT pulses palpable and protective sensation absent Left Foot: Hx BKA  Right Foot: HPK submet 2, HAV with HPK medial 1st met  No images are attached to the encounter.  Assessment:   1. Type 2 diabetes mellitus with pressure ulcer of toe, stage 1 (HCC)   2. Deformity of metatarsal bone of right foot   3. Callus    Plan:  Patient was evaluated and treated and all questions answered.  Wound right great toe sulcus; medial bunion -Wound remains healed. Fabricated new silicone spacer / bunion guard with extra submet 2 cushion.

## 2021-02-04 DIAGNOSIS — Z7984 Long term (current) use of oral hypoglycemic drugs: Secondary | ICD-10-CM | POA: Diagnosis not present

## 2021-02-04 DIAGNOSIS — E11621 Type 2 diabetes mellitus with foot ulcer: Secondary | ICD-10-CM | POA: Diagnosis not present

## 2021-02-04 DIAGNOSIS — I10 Essential (primary) hypertension: Secondary | ICD-10-CM | POA: Diagnosis not present

## 2021-02-04 DIAGNOSIS — M109 Gout, unspecified: Secondary | ICD-10-CM | POA: Diagnosis not present

## 2021-02-04 DIAGNOSIS — E113552 Type 2 diabetes mellitus with stable proliferative diabetic retinopathy, left eye: Secondary | ICD-10-CM | POA: Diagnosis not present

## 2021-02-04 DIAGNOSIS — E7800 Pure hypercholesterolemia, unspecified: Secondary | ICD-10-CM | POA: Diagnosis not present

## 2021-02-04 DIAGNOSIS — G4730 Sleep apnea, unspecified: Secondary | ICD-10-CM | POA: Diagnosis not present

## 2021-02-04 DIAGNOSIS — Z794 Long term (current) use of insulin: Secondary | ICD-10-CM | POA: Diagnosis not present

## 2021-02-04 DIAGNOSIS — E1151 Type 2 diabetes mellitus with diabetic peripheral angiopathy without gangrene: Secondary | ICD-10-CM | POA: Diagnosis not present

## 2021-02-11 ENCOUNTER — Other Ambulatory Visit (HOSPITAL_COMMUNITY): Payer: Self-pay

## 2021-02-11 MED FILL — Folic Acid Tab 1 MG: ORAL | 30 days supply | Qty: 30 | Fill #0 | Status: AC

## 2021-02-12 DIAGNOSIS — M1A9XX1 Chronic gout, unspecified, with tophus (tophi): Secondary | ICD-10-CM | POA: Diagnosis not present

## 2021-02-14 DIAGNOSIS — M1A9XX1 Chronic gout, unspecified, with tophus (tophi): Secondary | ICD-10-CM | POA: Diagnosis not present

## 2021-02-15 ENCOUNTER — Ambulatory Visit (INDEPENDENT_AMBULATORY_CARE_PROVIDER_SITE_OTHER): Payer: Medicare HMO | Admitting: Podiatry

## 2021-02-15 ENCOUNTER — Other Ambulatory Visit: Payer: Self-pay

## 2021-02-15 DIAGNOSIS — E11621 Type 2 diabetes mellitus with foot ulcer: Secondary | ICD-10-CM | POA: Diagnosis not present

## 2021-02-15 DIAGNOSIS — L89891 Pressure ulcer of other site, stage 1: Secondary | ICD-10-CM

## 2021-02-15 DIAGNOSIS — M21961 Unspecified acquired deformity of right lower leg: Secondary | ICD-10-CM

## 2021-02-15 NOTE — Progress Notes (Signed)
  Subjective:  Patient ID: Troy [name], male    DOB: 24-Sep-1965,  MRN: [id number]  Chief Complaint  Patient presents with   Bunions     6 bunion check 2 wk f/u   55 y.o. male presents for f/u. Denies new complaints.    Objective:  Physical Exam: warm, good capillary refill, nail exam onychomycosis of the toenails, DP pulses palpable, PT pulses palpable and protective sensation absent Left Foot: Hx BKA  Right Foot: HPK submet 2, HAV with HPK medial 1st met  No images are attached to the encounter.  Assessment:   1. Type 2 diabetes mellitus with pressure ulcer of toe, stage 1 (HCC)   2. Deformity of metatarsal bone of right foot     Plan:  Patient was evaluated and treated and all questions answered.  Wound right great toe sulcus; medial bunion -Wound remains healed. Gently debrided. New silicone spacer made today to offload bunion and 2nd metatarsal head.

## 2021-02-27 DIAGNOSIS — M1A9XX1 Chronic gout, unspecified, with tophus (tophi): Secondary | ICD-10-CM | POA: Diagnosis not present

## 2021-02-28 DIAGNOSIS — M1A9XX1 Chronic gout, unspecified, with tophus (tophi): Secondary | ICD-10-CM | POA: Diagnosis not present

## 2021-03-11 ENCOUNTER — Other Ambulatory Visit (HOSPITAL_COMMUNITY): Payer: Self-pay

## 2021-03-12 ENCOUNTER — Other Ambulatory Visit (HOSPITAL_COMMUNITY): Payer: Self-pay

## 2021-03-12 DIAGNOSIS — M1A9XX1 Chronic gout, unspecified, with tophus (tophi): Secondary | ICD-10-CM | POA: Diagnosis not present

## 2021-03-12 MED ORDER — FOLIC ACID 1 MG PO TABS
1.0000 mg | ORAL_TABLET | Freq: Every day | ORAL | 3 refills | Status: DC
  Filled 2021-03-12: qty 30, 30d supply, fill #0
  Filled 2021-04-09: qty 30, 30d supply, fill #1
  Filled 2021-05-13: qty 30, 30d supply, fill #2
  Filled 2021-06-14: qty 30, 30d supply, fill #3

## 2021-03-15 ENCOUNTER — Ambulatory Visit (HOSPITAL_COMMUNITY)
Admission: EM | Admit: 2021-03-15 | Discharge: 2021-03-15 | Disposition: A | Discharge status: Home / Self Care | Payer: Medicare HMO | Attending: Family | Admitting: Family

## 2021-03-15 ENCOUNTER — Other Ambulatory Visit: Payer: Self-pay

## 2021-03-15 ENCOUNTER — Ambulatory Visit: Payer: Medicare HMO | Admitting: Podiatry

## 2021-03-15 ENCOUNTER — Encounter (HOSPITAL_COMMUNITY): Payer: Self-pay

## 2021-03-15 ENCOUNTER — Other Ambulatory Visit (HOSPITAL_COMMUNITY): Payer: Self-pay

## 2021-03-15 DIAGNOSIS — R059 Cough, unspecified: Secondary | ICD-10-CM

## 2021-03-15 DIAGNOSIS — Z20822 Contact with and (suspected) exposure to covid-19: Secondary | ICD-10-CM | POA: Diagnosis not present

## 2021-03-15 DIAGNOSIS — R0982 Postnasal drip: Secondary | ICD-10-CM

## 2021-03-15 DIAGNOSIS — R0981 Nasal congestion: Secondary | ICD-10-CM | POA: Diagnosis not present

## 2021-03-15 LAB — SARS CORONAVIRUS 2 (TAT 6-24 HRS): SARS Coronavirus 2: NEGATIVE

## 2021-03-15 MED ORDER — GUAIFENESIN-CODEINE 100-10 MG/5ML PO SYRP
5.0000 mL | ORAL_SOLUTION | Freq: Three times a day (TID) | ORAL | 0 refills | Status: DC | PRN
  Filled 2021-03-15: qty 118, 8d supply, fill #0

## 2021-03-15 NOTE — ED Provider Notes (Signed)
[place]    CSN: [id number] Arrival date & time: 03/15/21  1019      History   Chief Complaint Chief Complaint  Patient presents with   Cough   Sore Throat   Headache    HPI Troy [name] is a 55 y.o. male.   55 year old male presents with sore throat, cough and headache that started 3 days ago. Throat has improved slightly. Headache was present yesterday but now has resolved. Cough continues along with nasal congestion and having difficulty sleeping at night. Possible fever and slight body aches. Denies any GI symptoms. Concerned over bronchitis. Has history of asthma and chronic bronchitis. Currently taking Symbicort daily. Has not used Albuterol inhaler yet. Took home COVID 19 test 2 days ago and was negative. Has had COVID 19 vaccine and booster. Other chronic health issues include HTN, type 2 DM, hyperlipidemia, Gout, chronic kidney disease, sleep apnea, and below knee amputation- left. Currently on Lisinopril, HCTZ, Metformin, Lantus, Glimepiride, aspirin, Folic acid, Lipitor, Neurontin, Flonase, Methotrexate, Protonix, and multiple eye drops daily. Just started Krystexxa injections for gout. Also has Tramadol, Viagra and Colchicine prn.   The history is provided by the patient.   Past Medical History:  Diagnosis Date   Asthma    Bronchitis    CKD (chronic kidney disease)    Diabetes mellitus    Diabetic Charcot foot ([place])    Left   Gout    Hypertension    Kidney stones 11/30/2013   Neuromuscular disorder ([place])    Obesity    Pancreatitis    Polyneuropathy in diabetes [facility])     Patient Active Problem List   Diagnosis Date Noted   Gout 09/25/2020   Morbid obesity ([place]) 09/25/2020   Painful amputation stump ([place]) 07/01/2019   Hyperlipidemia 09/07/2018   Diabetic foot ulcer ([place]) 09/07/2018   History of left below knee amputation ([place]) 03/26/2018   Dysphagia 09/01/2017   Special screening for malignant neoplasms, colon 09/01/2017   Esophageal  reflux 09/01/2017   Acute osteomyelitis of left foot ([place])    Diabetic foot ([place]) 02/02/2016   Osteomyelitis ([place]) 02/02/2016   Osteomyelitis of foot, acute, left 02/02/2016   CKD (chronic kidney disease), stage II 02/02/2016   Anemia 02/02/2016   Kidney stones 11/30/2013   Cholelithiasis: PER CT ABD/PELVIS AND ABD [place] 11/30/13 11/30/2013   Acute renal failure ([place]) 11/29/2013   Acute pancreatitis 11/29/2013   Asthma exacerbation 11/09/2013   CAP (community acquired pneumonia) 07/18/2013   Diabetes ([place]) 07/18/2013   Hypertension     Past Surgical History:  Procedure Laterality Date   AMPUTATION Left 02/06/2016   Procedure: AMPUTATION BELOW KNEE;  Surgeon: Marybelle Killings, MD;  Location: [place];  Service: Orthopedics;  Laterality: Left;   CHOLECYSTECTOMY N/A 12/03/2013   Procedure: LAPAROSCOPIC CHOLECYSTECTOMY WITH INTRAOPERATIVE CHOLANGIOGRAM;  Surgeon: Rolm Bookbinder, MD;  Location: [place];  Service: General;  Laterality: N/A;   COLONOSCOPY WITH PROPOFOL N/A 09/01/2017   Procedure: COLONOSCOPY WITH PROPOFOL;  Surgeon: Wilford Corner, MD;  Location: [facility];  Service: Endoscopy;  Laterality: N/A;   ESOPHAGOGASTRODUODENOSCOPY (EGD) WITH PROPOFOL N/A 09/01/2017   Procedure: ESOPHAGOGASTRODUODENOSCOPY (EGD) WITH PROPOFOL;  Surgeon: Wilford Corner, MD;  Location: [facility];  Service: Endoscopy;  Laterality: N/A;   NO PAST SURGERIES         Home Medications    Prior to Admission medications   Medication Sig Start Date End Date Taking? Authorizing Provider  guaiFENesin-codeine (ROBITUSSIN AC) 100-10 MG/5ML syrup  Take 5 mLs by mouth 3 (three) times daily as needed for cough. 03/15/21  Yes Safaa Stingley, Nicholes Stairs, NP  acetaminophen (TYLENOL) 500 MG tablet 1 tablet as needed    [provider]  ADVAIR DISKUS 250-50 MCG/DOSE AEPB  08/09/19   [provider]  albuterol (PROVENTIL HFA;VENTOLIN HFA) 108 (90 Base) MCG/ACT inhaler Inhale 1-2 puffs into the lungs every 6 (six)  hours as needed for wheezing or shortness of breath. 08/25/15   Hyman Bible, PA-C  albuterol (PROVENTIL) (2.5 MG/3ML) 0.083% nebulizer solution USE 3 ML PER NEBULIZATION EVERY 4 HOURS AS NEEDED FOR WHEEZING 06/28/20 06/28/21  Katy Apo, NP  Alcohol Swabs (B-D SINGLE USE SWABS REGULAR) PADS  07/29/19   [provider]  allopurinol (ZYLOPRIM) 100 MG tablet  09/21/19   [provider]  allopurinol (ZYLOPRIM) 300 MG tablet  10/05/19   [provider]  aspirin 81 MG EC tablet Take 81 mg by mouth daily. Swallow whole.    [provider]  atorvastatin (LIPITOR) 10 MG tablet Take 10 mg by mouth daily. 03/06/17   [provider]  Blood Glucose Calibration (ACCU-CHEK AVIVA) SOLN  07/29/19   [provider]  Blood Glucose Monitoring Suppl (ACCU-CHEK AVIVA PLUS) w/Device KIT USE TO TEST YOUR BLOOD SUGAR THREE TIMES A DAY 01/12/19   [provider]  Colchicine 0.6 MG CAPS  02/07/19   [provider]  CONTOUR NEXT TEST test strip 1 each daily as needed. 03/24/17   [provider]  dorzolamide-timolol (COSOPT) 22.3-6.8 MG/ML ophthalmic solution INSTILL 1 DROP INTO BOTH EYES TWICE A DAY 10/09/20 10/09/21  Warden Fillers, MD  DROPLET PEN NEEDLES 31G X 6 MM MISC  07/29/19   [provider]  fluticasone (FLONASE) 50 MCG/ACT nasal spray USE ONE SPRAY IN EACH NOSTRIL ONCE DAILY 30 60 06/29/20 06/29/21  Seward Carol, MD  folic acid (FOLVITE) 1 MG tablet TAKE 1 TABLET BY MOUTH ONCE A DAY 03/12/21     gabapentin (NEURONTIN) 300 MG capsule  02/03/20   [provider]  glimepiride (AMARYL) 4 MG tablet Take 4 mg by mouth daily with breakfast.    [provider]  LANTUS SOLOSTAR 100 UNIT/ML Solostar Pen Inject 10 Units into the skin at bedtime.  01/10/16   [provider]  lisinopril (ZESTRIL) 20 MG tablet  08/17/19   [provider]  lisinopril-hydrochlorothiazide (PRINZIDE,ZESTORETIC) 10-12.5 MG tablet  Take 1 tablet by mouth daily.  05/06/16   [provider]  metFORMIN (GLUCOPHAGE) 1000 MG tablet Take 1,000 mg by mouth 2 (two) times daily. 03/16/17   [provider]  methotrexate (RHEUMATREX) 2.5 MG tablet TAKE 4 TABLETS AT ONCE BY MOUTH ONCE A WEEK 10/26/20 10/26/21  Valinda Party, MD  MICROLET LANCETS MISC 1 each daily as needed. 02/16/17   [provider]  Multiple Vitamin (MULTIVITAMIN WITH MINERALS) TABS tablet Take 1 tablet by mouth daily.    [provider]  Multiple Vitamins-Minerals (MULTIVITAMIN ADULTS 50+) TABS See admin instructions.    [provider]  pantoprazole (PROTONIX) 40 MG tablet Take 40 mg by mouth daily.  06/11/16   [provider]  pegloticase [facility]) 8 MG/ML injection 1 ml    [provider]  prednisoLONE acetate (PRED FORTE) 1 % ophthalmic suspension  09/21/19   [provider]  sildenafil (VIAGRA) 100 MG tablet  01/04/19   [provider]  silver sulfADIAZINE (SILVADENE) 1 % cream Apply pea-sized amount to wound daily. 12/14/20  Evelina Bucy, DPM  traMADol Veatrice [place]) 50 MG tablet  12/02/18   [provider]    Family History Family History  Problem Relation Age of Onset   Diabetes Mellitus II Father    Diabetes Father    Hypertension Father    Diabetes Mother    Hypertension Mother    CAD Neg Hx     Social History Social History   Tobacco Use   Smoking status: Never   Smokeless tobacco: Never  Substance Use Topics   Alcohol use: Yes    Comment: occasionally   Drug use: No     Allergies   Patient has no known allergies.   Review of Systems Review of Systems  Constitutional:  Positive for chills, fatigue and fever. Negative for appetite change.  HENT:  Positive for congestion, postnasal drip, sinus pressure and sore throat. Negative for ear discharge, ear pain, facial swelling, mouth sores and trouble swallowing.   Eyes:  Negative for pain, discharge,  redness and itching.  Respiratory:  Positive for cough and chest tightness. Negative for wheezing.   Gastrointestinal:  Negative for diarrhea, nausea and vomiting.  Musculoskeletal:  Positive for arthralgias and myalgias. Negative for neck pain and neck stiffness.  Skin:  Negative for color change and rash.  Allergic/Immunologic: Positive for environmental allergies and immunocompromised state. Negative for food allergies.  Neurological:  Positive for headaches. Negative for dizziness, tremors, seizures, syncope and speech difficulty.  Hematological:  Negative for adenopathy. Does not bruise/bleed easily.  Psychiatric/Behavioral:  Positive for sleep disturbance.     Physical Exam Triage Vital Signs ED Triage Vitals  Enc Vitals Group     BP 03/15/21 1155 (!) 148/88     Pulse Rate 03/15/21 1155 88     Resp 03/15/21 1155 17     Temp 03/15/21 1155 97.9 F (36.6 C)     Temp Source 03/15/21 1155 Oral     SpO2 03/15/21 1155 100 %     Weight --      Height --      Head Circumference --      Peak Flow --      Pain Score 03/15/21 1154 0     Pain Loc --      Pain Edu? --      Excl. in [place]? --    No data found.  Updated Vital Signs BP (!) 148/88 (BP Location: Right Arm)   Pulse 88   Temp 97.9 F (36.6 C) (Oral)   Resp 17   SpO2 100%   Visual Acuity Right Eye Distance:   Left Eye Distance:   Bilateral Distance:    Right Eye Near:   Left Eye Near:    Bilateral Near:     Physical Exam Vitals and nursing note reviewed.  Constitutional:      General: He is awake. He is not in acute distress.    Appearance: He is well-developed and well-groomed.     Comments: He is sitting in the exam chair in no acute distress, talking in complete sentences but does appear tired and ill.   HENT:     Head: Normocephalic and atraumatic.     Right Ear: Hearing, tympanic membrane, ear canal and external ear normal.     Left Ear: Hearing, tympanic membrane, ear canal and external ear normal.      Nose: Congestion present.     Right Sinus: No maxillary sinus tenderness or frontal sinus tenderness.     Left Sinus:  No maxillary sinus tenderness or frontal sinus tenderness.     Mouth/Throat:     Lips: Pink.     Mouth: Mucous membranes are moist.     Pharynx: Uvula midline. Posterior oropharyngeal erythema present. No pharyngeal swelling, oropharyngeal exudate or uvula swelling.  Eyes:     Extraocular Movements: Extraocular movements intact.     Conjunctiva/sclera: Conjunctivae normal.  Cardiovascular:     Rate and Rhythm: Normal rate and regular rhythm.     Heart sounds: Normal heart sounds.  Pulmonary:     Effort: Pulmonary effort is normal. No accessory muscle usage, respiratory distress or retractions.     Breath sounds: Normal air entry. No decreased air movement. Examination of the right-upper field reveals decreased breath sounds and wheezing. Examination of the left-upper field reveals decreased breath sounds and wheezing. Decreased breath sounds and wheezing present. No rhonchi or rales.     Comments: Slight wheezes and decreased breath sounds in upper lobes. No coarse breath sounds heard.  Musculoskeletal:     Cervical back: Normal range of motion and neck supple.  Lymphadenopathy:     Cervical: No cervical adenopathy.  Skin:    General: Skin is warm and dry.     Capillary Refill: Capillary refill takes less than 2 seconds.     Findings: No rash.  Neurological:     General: No focal deficit present.     Mental Status: He is alert and oriented to person, place, and time.  Psychiatric:        Mood and Affect: Mood normal.        Behavior: Behavior normal. Behavior is cooperative.        Thought Content: Thought content normal.        Judgment: Judgment normal.     UC Treatments / Results  Labs (all labs ordered are listed, but only abnormal results are displayed) Labs Reviewed  SARS CORONAVIRUS 2 (TAT 6-24 HRS)    EKG   Radiology No results  found.  Procedures Procedures (including critical care time)  Medications Ordered in UC Medications - No data to display  Initial Impression / Assessment and Plan / UC Course  I have reviewed the triage vital signs and the nursing notes.  Pertinent labs & imaging results that were available during my care of the patient were reviewed by me and considered in my medical decision making (see chart for details).    Reviewed with patient that he appears to have a viral illness. Possible bronchitis but doubt bacterial component. Patient indicates Tessalon cough pills are not helpful. May try Robitussin AC cough syrup 1 teaspoon every 6 to 8 hours as needed for cough- use mainly at night to help sleep. Continue Symbicort as directed. Encouraged to use Albuterol nebulizer machine and nebules every 6 hours as needed- again- use before bed to help decrease coughing at night. May continue plain Mucinex twice a day as needed to help loosen up mucus in chest. Rest. Continue to push fluids. Follow-up pending COVID 19 test results and in 2 to 3 days if not improving.    Final Clinical Impressions(s) / UC Diagnoses   Final diagnoses:  Cough  Nasal congestion  Post-nasal drainage     Discharge Instructions      Recommend take Guiafenesin and Codeine cough medication 1 teaspoon every 8 hours as needed for cough. Continue Mucinex as directed. Continue Symbicort as directed and use Albuterol nebulizer solution every 6 to 8 hours as needed for cough/wheezing. Rest.  Continue to push fluids. Follow-up pending COVID 19 test results and in 2 to 3 days if not improving.      ED Prescriptions     Medication Sig Dispense Auth. Provider   guaiFENesin-codeine (ROBITUSSIN AC) 100-10 MG/5ML syrup Take 5 mLs by mouth 3 (three) times daily as needed for cough. 118 mL Katy Apo, NP      PDMP was reviewed this encounter. Last Rx was for Tramadol on 02/01/21 and then previous was 09/2020. I believe the  benefits of a controlled cough medication outweigh the risks at this time.    Katy Apo, NP 03/16/21 1308

## 2021-03-15 NOTE — Discharge Instructions (Addendum)
Recommend take Guiafenesin and Codeine cough medication 1 teaspoon every 8 hours as needed for cough. Continue Mucinex as directed. Continue Symbicort as directed and use Albuterol nebulizer solution every 6 to 8 hours as needed for cough/wheezing. Rest. Continue to push fluids. Follow-up pending COVID 19 test results and in 2 to 3 days if not improving.

## 2021-03-15 NOTE — ED Triage Notes (Signed)
Pt presents with a cough, sore throat x 3 days.  States he has a headache since yesterday and c/o not being able to sleep because of the cough.

## 2021-03-20 DIAGNOSIS — H3582 Retinal ischemia: Secondary | ICD-10-CM | POA: Diagnosis not present

## 2021-03-20 DIAGNOSIS — H40053 Ocular hypertension, bilateral: Secondary | ICD-10-CM | POA: Diagnosis not present

## 2021-03-20 DIAGNOSIS — E113513 Type 2 diabetes mellitus with proliferative diabetic retinopathy with macular edema, bilateral: Secondary | ICD-10-CM | POA: Diagnosis not present

## 2021-03-20 DIAGNOSIS — Z961 Presence of intraocular lens: Secondary | ICD-10-CM | POA: Diagnosis not present

## 2021-03-21 DIAGNOSIS — R6889 Other general symptoms and signs: Secondary | ICD-10-CM | POA: Diagnosis not present

## 2021-03-21 DIAGNOSIS — M1A9XX1 Chronic gout, unspecified, with tophus (tophi): Secondary | ICD-10-CM | POA: Diagnosis not present

## 2021-03-22 DIAGNOSIS — H40053 Ocular hypertension, bilateral: Secondary | ICD-10-CM | POA: Diagnosis not present

## 2021-03-22 DIAGNOSIS — H40051 Ocular hypertension, right eye: Secondary | ICD-10-CM | POA: Diagnosis not present

## 2021-03-22 DIAGNOSIS — H52223 Regular astigmatism, bilateral: Secondary | ICD-10-CM | POA: Diagnosis not present

## 2021-03-22 DIAGNOSIS — H524 Presbyopia: Secondary | ICD-10-CM | POA: Diagnosis not present

## 2021-03-25 DIAGNOSIS — J45909 Unspecified asthma, uncomplicated: Secondary | ICD-10-CM | POA: Diagnosis not present

## 2021-03-25 DIAGNOSIS — M1990 Unspecified osteoarthritis, unspecified site: Secondary | ICD-10-CM | POA: Diagnosis not present

## 2021-03-25 DIAGNOSIS — M1A9XX1 Chronic gout, unspecified, with tophus (tophi): Secondary | ICD-10-CM | POA: Diagnosis not present

## 2021-03-25 DIAGNOSIS — M25569 Pain in unspecified knee: Secondary | ICD-10-CM | POA: Diagnosis not present

## 2021-03-25 DIAGNOSIS — Z79899 Other long term (current) drug therapy: Secondary | ICD-10-CM | POA: Diagnosis not present

## 2021-03-25 DIAGNOSIS — E119 Type 2 diabetes mellitus without complications: Secondary | ICD-10-CM | POA: Diagnosis not present

## 2021-03-26 DIAGNOSIS — G4733 Obstructive sleep apnea (adult) (pediatric): Secondary | ICD-10-CM | POA: Diagnosis not present

## 2021-03-29 ENCOUNTER — Other Ambulatory Visit: Payer: Self-pay

## 2021-03-29 ENCOUNTER — Ambulatory Visit (INDEPENDENT_AMBULATORY_CARE_PROVIDER_SITE_OTHER): Payer: Medicare HMO | Admitting: Podiatry

## 2021-03-29 DIAGNOSIS — M21961 Unspecified acquired deformity of right lower leg: Secondary | ICD-10-CM

## 2021-03-29 DIAGNOSIS — L97419 Non-pressure chronic ulcer of right heel and midfoot with unspecified severity: Secondary | ICD-10-CM | POA: Diagnosis not present

## 2021-03-29 DIAGNOSIS — E08621 Diabetes mellitus due to underlying condition with foot ulcer: Secondary | ICD-10-CM

## 2021-03-29 DIAGNOSIS — R6889 Other general symptoms and signs: Secondary | ICD-10-CM | POA: Diagnosis not present

## 2021-03-29 NOTE — Progress Notes (Signed)
  Subjective:  Patient ID: DIANA ARMIJO, male    DOB: 10-05-1965,  MRN: [id number]  No chief complaint on file.  55 y.o. male presents for f/u. Denies new complaints.  Has been using his silicone spacer and states it is doing well without skin breakdown.  Objective:  Physical Exam: warm, good capillary refill, nail exam onychomycosis of the toenails, DP pulses palpable, PT pulses palpable and protective sensation absent Left Foot: Hx BKA  Right Foot: HPK submet 2, HAV with HPK medial 1st met  No images are attached to the encounter.  Assessment:   1. Deformity of metatarsal bone of right foot   2. Diabetic ulcer of right midfoot associated with diabetes mellitus due to underlying condition, unspecified ulcer stage ([place])      Plan:  Patient was evaluated and treated and all questions answered.  Wound right great toe sulcus; medial bunion -Wound remains healed. Gently debrided HPK; continue to monitor for signs of ulceration. Continue custom spacer. F/u in 1 month for recheck.

## 2021-04-02 DIAGNOSIS — R6889 Other general symptoms and signs: Secondary | ICD-10-CM | POA: Diagnosis not present

## 2021-04-08 DIAGNOSIS — M1A9XX1 Chronic gout, unspecified, with tophus (tophi): Secondary | ICD-10-CM | POA: Diagnosis not present

## 2021-04-09 ENCOUNTER — Other Ambulatory Visit (HOSPITAL_COMMUNITY): Payer: Self-pay

## 2021-04-09 MED ORDER — PEG 3350-KCL-NA BICARB-NACL 420 G PO SOLR
4000.0000 mL | ORAL | 0 refills | Status: DC
  Filled 2021-04-09: qty 4000, 1d supply, fill #0

## 2021-04-15 ENCOUNTER — Other Ambulatory Visit: Payer: Self-pay

## 2021-04-15 ENCOUNTER — Encounter (HOSPITAL_COMMUNITY): Payer: Self-pay | Admitting: Gastroenterology

## 2021-04-16 ENCOUNTER — Other Ambulatory Visit: Payer: Self-pay | Admitting: Gastroenterology

## 2021-04-18 DIAGNOSIS — F3341 Major depressive disorder, recurrent, in partial remission: Secondary | ICD-10-CM | POA: Diagnosis not present

## 2021-04-18 DIAGNOSIS — E1151 Type 2 diabetes mellitus with diabetic peripheral angiopathy without gangrene: Secondary | ICD-10-CM | POA: Diagnosis not present

## 2021-04-18 DIAGNOSIS — J45901 Unspecified asthma with (acute) exacerbation: Secondary | ICD-10-CM | POA: Diagnosis not present

## 2021-04-18 DIAGNOSIS — E782 Mixed hyperlipidemia: Secondary | ICD-10-CM | POA: Diagnosis not present

## 2021-04-18 DIAGNOSIS — I10 Essential (primary) hypertension: Secondary | ICD-10-CM | POA: Diagnosis not present

## 2021-04-18 DIAGNOSIS — J45909 Unspecified asthma, uncomplicated: Secondary | ICD-10-CM | POA: Diagnosis not present

## 2021-04-18 DIAGNOSIS — K219 Gastro-esophageal reflux disease without esophagitis: Secondary | ICD-10-CM | POA: Diagnosis not present

## 2021-04-18 DIAGNOSIS — E1140 Type 2 diabetes mellitus with diabetic neuropathy, unspecified: Secondary | ICD-10-CM | POA: Diagnosis not present

## 2021-04-18 DIAGNOSIS — N182 Chronic kidney disease, stage 2 (mild): Secondary | ICD-10-CM | POA: Diagnosis not present

## 2021-04-23 ENCOUNTER — Encounter (HOSPITAL_COMMUNITY)
Admission: RE | Disposition: A | Discharge status: Home / Self Care | Payer: Self-pay | Source: Home / Self Care | Attending: Gastroenterology

## 2021-04-23 ENCOUNTER — Ambulatory Visit (HOSPITAL_COMMUNITY): Payer: Medicare HMO | Admitting: Anesthesiology

## 2021-04-23 ENCOUNTER — Encounter (HOSPITAL_COMMUNITY): Payer: Self-pay | Admitting: Gastroenterology

## 2021-04-23 ENCOUNTER — Other Ambulatory Visit: Payer: Self-pay

## 2021-04-23 ENCOUNTER — Ambulatory Visit (HOSPITAL_COMMUNITY)
Admission: RE | Admit: 2021-04-23 | Discharge: 2021-04-23 | Disposition: A | Discharge status: Home / Self Care | Payer: Medicare HMO | Attending: Gastroenterology | Admitting: Gastroenterology

## 2021-04-23 DIAGNOSIS — E669 Obesity, unspecified: Secondary | ICD-10-CM | POA: Diagnosis not present

## 2021-04-23 DIAGNOSIS — K5730 Diverticulosis of large intestine without perforation or abscess without bleeding: Secondary | ICD-10-CM | POA: Insufficient documentation

## 2021-04-23 DIAGNOSIS — E1122 Type 2 diabetes mellitus with diabetic chronic kidney disease: Secondary | ICD-10-CM | POA: Diagnosis not present

## 2021-04-23 DIAGNOSIS — N189 Chronic kidney disease, unspecified: Secondary | ICD-10-CM | POA: Diagnosis not present

## 2021-04-23 DIAGNOSIS — K635 Polyp of colon: Secondary | ICD-10-CM | POA: Diagnosis not present

## 2021-04-23 DIAGNOSIS — Z6841 Body Mass Index (BMI) 40.0 and over, adult: Secondary | ICD-10-CM | POA: Insufficient documentation

## 2021-04-23 DIAGNOSIS — J45909 Unspecified asthma, uncomplicated: Secondary | ICD-10-CM | POA: Insufficient documentation

## 2021-04-23 DIAGNOSIS — I129 Hypertensive chronic kidney disease with stage 1 through stage 4 chronic kidney disease, or unspecified chronic kidney disease: Secondary | ICD-10-CM | POA: Diagnosis not present

## 2021-04-23 DIAGNOSIS — G4730 Sleep apnea, unspecified: Secondary | ICD-10-CM | POA: Insufficient documentation

## 2021-04-23 DIAGNOSIS — D125 Benign neoplasm of sigmoid colon: Secondary | ICD-10-CM | POA: Diagnosis not present

## 2021-04-23 DIAGNOSIS — K640 First degree hemorrhoids: Secondary | ICD-10-CM | POA: Insufficient documentation

## 2021-04-23 DIAGNOSIS — Z860100 Personal history of colon polyps, unspecified: Secondary | ICD-10-CM

## 2021-04-23 HISTORY — PX: COLONOSCOPY WITH PROPOFOL: SHX5780

## 2021-04-23 HISTORY — PX: POLYPECTOMY: SHX5525

## 2021-04-23 HISTORY — DX: Sleep apnea, unspecified: G47.30

## 2021-04-23 LAB — GLUCOSE, CAPILLARY: Glucose-Capillary: 103 mg/dL — ABNORMAL HIGH (ref 70–99)

## 2021-04-23 SURGERY — COLONOSCOPY WITH PROPOFOL
Anesthesia: Monitor Anesthesia Care

## 2021-04-23 MED ORDER — LIDOCAINE 2% (20 MG/ML) 5 ML SYRINGE
INTRAMUSCULAR | Status: DC | PRN
  Administered 2021-04-23: 100 mg via INTRAVENOUS

## 2021-04-23 MED ORDER — LACTATED RINGERS IV SOLN
INTRAVENOUS | Status: AC | PRN
  Administered 2021-04-23: 1000 mL via INTRAVENOUS

## 2021-04-23 MED ORDER — SODIUM CHLORIDE 0.9 % IV SOLN: INTRAVENOUS | Status: DC

## 2021-04-23 MED ORDER — ONDANSETRON HCL 4 MG/2ML IJ SOLN
INTRAMUSCULAR | Status: DC | PRN
  Administered 2021-04-23: 4 mg via INTRAVENOUS

## 2021-04-23 MED ORDER — PROPOFOL 500 MG/50ML IV EMUL
INTRAVENOUS | Status: DC | PRN
  Administered 2021-04-23: 200 ug/kg/min via INTRAVENOUS

## 2021-04-23 SURGICAL SUPPLY — 22 items

## 2021-04-23 NOTE — Anesthesia Postprocedure Evaluation (Signed)
Anesthesia Post Note  Patient: Troy [name]  Procedure(s) Performed: COLONOSCOPY WITH PROPOFOL POLYPECTOMY     Patient location during evaluation: Endoscopy Anesthesia Type: MAC Level of consciousness: awake and alert Pain management: pain level controlled Vital Signs Assessment: post-procedure vital signs reviewed and stable Respiratory status: spontaneous breathing, nonlabored ventilation and respiratory function stable Cardiovascular status: stable and blood pressure returned to baseline Postop Assessment: no apparent nausea or vomiting Anesthetic complications: no   No notable events documented.  Last Vitals:  Vitals:   04/23/21 0950 04/23/21 0955  BP: (!) 142/94 (!) 145/94  Pulse: 76 75  Resp: 13 16  Temp:    SpO2: 96% 99%    Last Pain:  Vitals:   04/23/21 0935  TempSrc: Oral  PainSc: 0-No pain                 Cheron Pasquarelli,W. EDMOND

## 2021-04-23 NOTE — Interval H&P Note (Signed)
History and Physical Interval Note:  04/23/2021 8:44 AM  Troy [name]  has presented today for surgery, with the diagnosis of Hx of polyps.  The various methods of treatment have been discussed with the patient and family. After consideration of risks, benefits and other options for treatment, the patient has consented to  Procedure(s): COLONOSCOPY WITH PROPOFOL (N/A) as a surgical intervention.  The patient's history has been reviewed, patient examined, no change in status, stable for surgery.  I have reviewed the patient's chart and labs.  Questions were answered to the patient's satisfaction.     Lear Ng

## 2021-04-23 NOTE — H&P (Signed)
  Date of Initial H&P: 04/16/21  History reviewed, patient examined, no change in status, stable for surgery.

## 2021-04-23 NOTE — Op Note (Signed)
[facility] Patient Name: Troy [name] Procedure Date: 04/23/2021 MRN: [id number] Attending MD: Lear Ng , MD Date of Birth: [birth year] CSN: [id number] Age: 56 Admit Type: Outpatient Procedure:                Colonoscopy Indications:              High risk colon cancer surveillance: Personal                            history of colonic polyps, Last colonoscopy:                            February 2019 Providers:                Lear Ng, MD, Dulcy Fanny, Cherylynn Ridges, Technician Referring MD:             Seward Carol Medicines:                Propofol per Anesthesia, Monitored Anesthesia Care Complications:            No immediate complications. Estimated Blood Loss:     Estimated blood loss: none. Procedure:                Pre-Anesthesia Assessment:                           - Prior to the procedure, a History and Physical                            was performed, and patient medications and                            allergies were reviewed. The patient's tolerance of                            previous anesthesia was also reviewed. The risks                            and benefits of the procedure and the sedation                            options and risks were discussed with the patient.                            All questions were answered, and informed consent                            was obtained. Prior Anticoagulants: The patient has                            taken no previous anticoagulant or antiplatelet                            agents. ASA  Grade Assessment: III - A patient with                            severe systemic disease. After reviewing the risks                            and benefits, the patient was deemed in                            satisfactory condition to undergo the procedure.                           After obtaining informed consent, the colonoscope                             was passed under direct vision. Throughout the                            procedure, the patient's blood pressure, pulse, and                            oxygen saturations were monitored continuously. The                            PCF-HQ190L ([id number] Olympus colonoscope was                            introduced through the anus and advanced to the the                            cecum, identified by appendiceal orifice and                            ileocecal valve. The colonoscopy was performed with                            difficulty due to inadequate bowel prep and a                            tortuous colon. Successful completion of the                            procedure was aided by straightening and shortening                            the scope to obtain bowel loop reduction and                            lavage. The patient tolerated the procedure well.                            The quality of the bowel preparation was inadequate  but repeated irrigation and aspiration led to a                            fair prep. Scope In: 8:57:39 AM Scope Out: 9:24:32 AM Scope Withdrawal Time: 0 hours 20 minutes 0 seconds  Total Procedure Duration: 0 hours 26 minutes 53 seconds  Findings:      The perianal and digital rectal examinations were normal.      A 5 mm polyp was found in the proximal sigmoid colon. The polyp was       semi-sessile. The polyp was removed with a hot snare. Resection and       retrieval were complete. Estimated blood loss: none.      A 6 mm polyp was found in the distal sigmoid colon. The polyp was       sessile. The polyp was removed with a hot snare. Resection and retrieval       were complete. Estimated blood loss: none.      Scattered small and large-mouthed diverticula were found in the sigmoid       colon and ascending colon.      Internal hemorrhoids were found during retroflexion. The hemorrhoids       were small and Grade I  (internal hemorrhoids that do not prolapse).      The terminal ileum appeared normal. Impression:               - One 5 mm polyp in the proximal sigmoid colon,                            removed with a hot snare. Resected and retrieved.                           - One 6 mm polyp in the distal sigmoid colon,                            removed with a hot snare. Resected and retrieved.                           - Diverticulosis in the sigmoid colon and in the                            ascending colon.                           - Internal hemorrhoids.                           - The examined portion of the ileum was normal. Moderate Sedation:      Not Applicable - Patient had care per Anesthesia. Recommendation:           - Patient has a contact number available for                            emergencies. The signs and symptoms of potential                            delayed complications were  discussed with the                            patient. Return to normal activities tomorrow.                            Written discharge instructions were provided to the                            patient.                           - High fiber diet.                           - Await pathology results.                           - Repeat colonoscopy for surveillance based on                            pathology results.                           - No ibuprofen, naproxen, or other non-steroidal                            anti-inflammatory drugs for 2 weeks. Procedure Code(s):        --- Professional ---                           [phone], Colonoscopy, flexible; with removal of                            tumor(s), polyp(s), or other lesion(s) by snare                            technique Diagnosis Code(s):        --- Professional ---                           Z86.010, Personal history of colonic polyps                           K63.5, Polyp of colon                           K64.0, First degree  hemorrhoids                           K57.30, Diverticulosis of large intestine without                            perforation or abscess without bleeding CPT copyright 2019 [employer]. All rights reserved. The codes documented in this report are preliminary and upon coder review may  be revised to meet current compliance requirements. Lear Ng, MD 04/23/2021 9:31:56 AM This report has  been signed electronically. Number of Addenda: 0

## 2021-04-23 NOTE — Transfer of Care (Signed)
Immediate Anesthesia Transfer of Care Note  Patient: Troy [name]  Procedure(s) Performed: COLONOSCOPY WITH PROPOFOL POLYPECTOMY  Patient Location: Endoscopy Unit  Anesthesia Type:MAC  Level of Consciousness: awake, alert , oriented and patient cooperative  Airway & Oxygen Therapy: Patient Spontanous Breathing and Patient connected to face mask oxygen  Post-op Assessment: Report given to RN, Post -op Vital signs reviewed and stable and Patient moving all extremities  Post vital signs: Reviewed and stable  Last Vitals:  Vitals Value Taken Time  BP 107/74 04/23/21 0931  Temp 36.4 C 04/23/21 0935  Pulse 82 04/23/21 0939  Resp 20 04/23/21 0941  SpO2 98 % 04/23/21 0939  Vitals shown include unvalidated device data.  Last Pain:  Vitals:   04/23/21 0935  TempSrc: Oral  PainSc: 0-No pain      Patients Stated Pain Goal: 0 (81/85/90 9311)  Complications: No notable events documented.

## 2021-04-23 NOTE — Anesthesia Preprocedure Evaluation (Signed)
Anesthesia Evaluation  Patient identified by MRN, date of birth, ID band Patient awake    Reviewed: Allergy & Precautions, H&P , NPO status , Patient's Chart, lab work & pertinent test results  Airway Mallampati: III  TM Distance: >3 FB Neck ROM: Full    Dental no notable dental hx. (+) Edentulous Upper, Edentulous Lower, Dental Advisory Given   Pulmonary asthma , sleep apnea and Continuous Positive Airway Pressure Ventilation ,    Pulmonary exam normal breath sounds clear to auscultation       Cardiovascular hypertension, On Medications  Rhythm:Regular Rate:Normal     Neuro/Psych negative neurological ROS  negative psych ROS   GI/Hepatic Neg liver ROS, GERD  ,  Endo/Other  diabetes, Type 2, Oral Hypoglycemic Agents, Insulin DependentMorbid obesity  Renal/GU Renal diseasenegative Renal ROS  negative genitourinary   Musculoskeletal  (+) Arthritis ,   Abdominal   Peds  Hematology  (+) Blood dyscrasia, anemia ,   Anesthesia Other Findings   Reproductive/Obstetrics negative OB ROS                             Anesthesia Physical Anesthesia Plan  ASA: 3  Anesthesia Plan: MAC   Post-op Pain Management:    Induction: Intravenous  PONV Risk Score and Plan: 1 and Propofol infusion  Airway Management Planned: Simple Face Mask  Additional Equipment:   Intra-op Plan:   Post-operative Plan:   Informed Consent: I have reviewed the patients History and Physical, chart, labs and discussed the procedure including the risks, benefits and alternatives for the proposed anesthesia with the patient or authorized representative who has indicated his/her understanding and acceptance.     Dental advisory given  Plan Discussed with: CRNA  Anesthesia Plan Comments:         Anesthesia Quick Evaluation

## 2021-04-24 ENCOUNTER — Encounter (HOSPITAL_COMMUNITY): Payer: Self-pay | Admitting: Gastroenterology

## 2021-04-24 LAB — SURGICAL PATHOLOGY

## 2021-04-25 DIAGNOSIS — M1A9XX1 Chronic gout, unspecified, with tophus (tophi): Secondary | ICD-10-CM | POA: Diagnosis not present

## 2021-04-30 ENCOUNTER — Other Ambulatory Visit: Payer: Self-pay

## 2021-04-30 ENCOUNTER — Ambulatory Visit (INDEPENDENT_AMBULATORY_CARE_PROVIDER_SITE_OTHER): Payer: Medicare HMO | Admitting: Podiatry

## 2021-04-30 DIAGNOSIS — E08621 Diabetes mellitus due to underlying condition with foot ulcer: Secondary | ICD-10-CM | POA: Diagnosis not present

## 2021-04-30 DIAGNOSIS — M21961 Unspecified acquired deformity of right lower leg: Secondary | ICD-10-CM

## 2021-04-30 DIAGNOSIS — L97419 Non-pressure chronic ulcer of right heel and midfoot with unspecified severity: Secondary | ICD-10-CM

## 2021-05-07 DIAGNOSIS — Z961 Presence of intraocular lens: Secondary | ICD-10-CM | POA: Diagnosis not present

## 2021-05-07 DIAGNOSIS — H40053 Ocular hypertension, bilateral: Secondary | ICD-10-CM | POA: Diagnosis not present

## 2021-05-07 DIAGNOSIS — H3582 Retinal ischemia: Secondary | ICD-10-CM | POA: Diagnosis not present

## 2021-05-07 DIAGNOSIS — E113513 Type 2 diabetes mellitus with proliferative diabetic retinopathy with macular edema, bilateral: Secondary | ICD-10-CM | POA: Diagnosis not present

## 2021-05-07 NOTE — Progress Notes (Signed)
  Subjective:  Patient ID: JOSEANGEL NETTLETON, male    DOB: 1965-09-24,  MRN: [id number]  Chief Complaint  Patient presents with   Bunions    Follow up bunion right foot. Pt states no complaints.     55 y.o. male presents for f/u. Denies new complaints. States his spacer was working well until it broke. Does not think the callus area is doing worse.  Objective:  Physical Exam: warm, good capillary refill, nail exam onychomycosis of the toenails, DP pulses palpable, PT pulses palpable and protective sensation absent Left Foot: Hx BKA  Right Foot: HPK submet 2, HAV with HPK medial 1st met  No images are attached to the encounter.  Assessment:   1. Deformity of metatarsal bone of right foot   2. Diabetic ulcer of right midfoot associated with diabetes mellitus due to underlying condition, unspecified ulcer stage ([place])       Plan:  Patient was evaluated and treated and all questions answered.  Wound right great toe sulcus; medial bunion -Wound remains healed. States his custom spacer broke. I will order the materials to make him a new one. Minimally debrided HPK. F/u in 1 month, sooner should issues persist.

## 2021-05-09 DIAGNOSIS — M1A9XX1 Chronic gout, unspecified, with tophus (tophi): Secondary | ICD-10-CM | POA: Diagnosis not present

## 2021-05-09 DIAGNOSIS — R6889 Other general symptoms and signs: Secondary | ICD-10-CM | POA: Diagnosis not present

## 2021-05-13 ENCOUNTER — Other Ambulatory Visit (HOSPITAL_COMMUNITY): Payer: Self-pay

## 2021-05-27 ENCOUNTER — Ambulatory Visit (INDEPENDENT_AMBULATORY_CARE_PROVIDER_SITE_OTHER): Payer: Medicare HMO | Admitting: Orthopaedic Surgery

## 2021-05-27 ENCOUNTER — Other Ambulatory Visit: Payer: Self-pay

## 2021-05-27 ENCOUNTER — Ambulatory Visit: Payer: Self-pay

## 2021-05-27 ENCOUNTER — Encounter: Payer: Self-pay | Admitting: Orthopaedic Surgery

## 2021-05-27 VITALS — BP 136/88 | Ht 70.0 in | Wt 290.0 lb

## 2021-05-27 DIAGNOSIS — M545 Low back pain, unspecified: Secondary | ICD-10-CM

## 2021-05-27 MED ORDER — CYCLOBENZAPRINE HCL 10 MG PO TABS: 10.0000 mg | ORAL_TABLET | Freq: Three times a day (TID) | ORAL | 0 refills | Status: DC | PRN

## 2021-05-27 NOTE — Progress Notes (Signed)
x  Office Visit Note   Patient: Troy [name]           Date of Birth: [birth year]           MRN: [id number] Visit Date: 05/27/2021              Requested by: Seward Carol, MD [address] [place],  [place] [address] PCP: Seward Carol, MD   Assessment & Plan: Visit Diagnoses:  1. Acute left-sided low back pain, unspecified whether sciatica present     Plan: Flexeril prescribed.  He can continue to use the Ultram that he has, heating pad he will be careful he does not burn his skin.  Continue recommending good glucose control.  I will recheck him in 3 weeks if having persistent symptoms then we can consider diagnostic imaging.  Follow-Up Instructions: Return in about 3 weeks (around 06/17/2021).   Orders:  Orders Placed This Encounter  Procedures   XR Lumbar Spine 2-3 Views   XR Pelvis 1-2 Views   Meds ordered this encounter  Medications   cyclobenzaprine (FLEXERIL) 10 MG tablet    Sig: Take 1 tablet (10 mg total) by mouth 3 (three) times daily as needed for muscle spasms.    Dispense:  30 tablet    Refill:  0      Procedures: No procedures performed   Clinical Data: No additional findings.   Subjective: Chief Complaint  Patient presents with   Lower Back - Pain    HPI 55 year old male with left-sided low back pain slightly more than a week with no known injury.  Pain radiates from the left side lower back into the left groin.  He has a BKA prosthesis and has had some loosening with stump shrinkage and states he has an appointment with his prosthetists coming up to get it adjusted tomorrow he is amatory with a cane.  He was wondering if his altered gait is flared up his back symptoms.  He had some tramadol that did not help he had some Flexeril that his wife had and he states this actually helped his back.  He has more problems when he is bending turning or twisting.  Better if he is supine.  No associated bowel or bladder symptoms.  Review of  Systems positive history of hypertension diabetes pancreatitis renal failure stage II kidney disease diabetic foot ulcer with resultant left BKA by Dr. Inda Merlin 2017.  All other systems noncontributory HPI.   Objective: Vital Signs: BP 136/88   Ht 5\' 10"  (1.778 m)   Wt 290 lb (131.5 kg)   BMI 41.61 kg/m   Physical Exam Constitutional:      Appearance: He is well-developed.  HENT:     Head: Normocephalic and atraumatic.     Right Ear: External ear normal.     Left Ear: External ear normal.  Eyes:     Pupils: Pupils are equal, round, and reactive to light.  Neck:     Thyroid: No thyromegaly.     Trachea: No tracheal deviation.  Cardiovascular:     Rate and Rhythm: Normal rate.  Pulmonary:     Effort: Pulmonary effort is normal.     Breath sounds: No wheezing.  Abdominal:     General: Bowel sounds are normal.     Palpations: Abdomen is soft.  Musculoskeletal:     Cervical back: Neck supple.  Skin:    General: Skin is warm and dry.     Capillary Refill:  Capillary refill takes less than 2 seconds.  Neurological:     Mental Status: He is alert and oriented to person, place, and time.  Psychiatric:        Behavior: Behavior normal.        Thought Content: Thought content normal.        Judgment: Judgment normal.    Ortho Exam patient has mild tenderness sciatic notch negative logroll the hips.  Negative straight leg raising 90 degrees.  Left BKA prosthesis.  He is amatory with a left leg limp .  Mild pelvic obliquity left side iliac crest lower.  Specialty Comments:  No specialty comments available.  Imaging: XR Lumbar Spine 2-3 Views  Result Date: 05/27/2021 AP lateral lumbar images demonstrate mid lumbar L2-3, 3 4 and L4-5 lateral and endplate spurring.  No acute changes negative anterolisthesis.  There is degenerative facet changes are noted. Impression: Multilevel mid lumbar degenerative changes.  XR Pelvis 1-2 Views  Result Date: 05/27/2021 AP pelvis x-ray  demonstrates some minimal spurring of the hips with maintained joint space.  Mild pelvic obliquity. Impression: Pelvis and hip x-rays negative for acute changes.    PMFS History: Patient Active Problem List   Diagnosis Date Noted   Personal history of colonic polyps 04/23/2021   Gout 09/25/2020   Morbid obesity ([place]) 09/25/2020   Painful amputation stump ([place]) 07/01/2019   Hyperlipidemia 09/07/2018   Diabetic foot ulcer ([place]) 09/07/2018   History of left below knee amputation ([place]) 03/26/2018   Dysphagia 09/01/2017   Special screening for malignant neoplasms, colon 09/01/2017   Esophageal reflux 09/01/2017   Acute osteomyelitis of left foot ([place])    Diabetic foot ([place]) 02/02/2016   Osteomyelitis ([place]) 02/02/2016   Osteomyelitis of foot, acute, left 02/02/2016   CKD (chronic kidney disease), stage II 02/02/2016   Anemia 02/02/2016   Kidney stones 11/30/2013   Cholelithiasis: PER CT ABD/PELVIS AND ABD [place] 11/30/13 11/30/2013   Acute renal failure ([place]) 11/29/2013   Acute pancreatitis 11/29/2013   Asthma exacerbation 11/09/2013   CAP (community acquired pneumonia) 07/18/2013   Diabetes ([place]) 07/18/2013   Hypertension    Past Medical History:  Diagnosis Date   Asthma    Bronchitis    CKD (chronic kidney disease)    Diabetes mellitus    Diabetic Charcot foot ([place])    Left   Gout    Hypertension    Kidney stones 11/30/2013   Neuromuscular disorder (H[facility])    Obesity    Pancreatitis    Polyneuropathy in diabetes ([place])    Sleep apnea     Family History  Problem Relation Age of Onset   Diabetes Mellitus II Father    Diabetes Father    Hypertension Father    Diabetes Mother    Hypertension Mother    CAD Neg Hx     Past Surgical History:  Procedure Laterality Date   AMPUTATION Left 02/06/2016   Procedure: AMPUTATION BELOW KNEE;  Surgeon: Marybelle Killings, MD;  Location: [facility];  Service: Orthopedics;  Laterality: Left;   CHOLECYSTECTOMY N/A 12/03/2013   Procedure: LAPAROSCOPIC  CHOLECYSTECTOMY WITH INTRAOPERATIVE CHOLANGIOGRAM;  Surgeon: Rolm Bookbinder, MD;  Location: [facility];  Service: General;  Laterality: N/A;   COLONOSCOPY WITH PROPOFOL N/A 09/01/2017   Procedure: COLONOSCOPY WITH PROPOFOL;  Surgeon: Wilford Corner, MD;  [facility]: [facility];  Service: Endoscopy;  Laterality: N/A;   COLONOSCOPY WITH PROPOFOL N/A 04/23/2021   Procedure: COLONOSCOPY WITH PROPOFOL;  Surgeon: Wilford Corner, MD;  [facility];  Service: Endoscopy;  Laterality: N/A;   ESOPHAGOGASTRODUODENOSCOPY (EGD) WITH PROPOFOL N/A 09/01/2017   Procedure: ESOPHAGOGASTRODUODENOSCOPY (EGD) WITH PROPOFOL;  Surgeon: Wilford Corner, MD;  [facility];  Service: Endoscopy;  Laterality: N/A;   NO PAST SURGERIES     POLYPECTOMY  04/23/2021   Procedure: POLYPECTOMY;  Surgeon: Wilford Corner, MD;  [facility];  Service: Endoscopy;;   Social History   Occupational History   Not on file  Tobacco Use   Smoking status: Never   Smokeless tobacco: Never  Substance and Sexual Activity   Alcohol use: Yes    Comment: occasionally   Drug use: No   Sexual activity: Not on file

## 2021-05-31 ENCOUNTER — Other Ambulatory Visit: Payer: Self-pay

## 2021-05-31 ENCOUNTER — Ambulatory Visit (INDEPENDENT_AMBULATORY_CARE_PROVIDER_SITE_OTHER): Payer: Medicare HMO | Admitting: Podiatry

## 2021-05-31 DIAGNOSIS — M21961 Unspecified acquired deformity of right lower leg: Secondary | ICD-10-CM | POA: Diagnosis not present

## 2021-05-31 NOTE — Progress Notes (Signed)
  Subjective:  Patient ID: Troy [name], male    DOB: 1966-04-21,  MRN: [id number]  Chief Complaint  Patient presents with   Bunions    Right 1st bunion. Pt states no complaints.     55 y.o. male presents for f/u. Denies new complaints.   Objective:  Physical Exam: warm, good capillary refill, nail exam onychomycosis of the toenails, DP pulses palpable, PT pulses palpable and protective sensation absent Left Foot: Hx BKA  Right Foot: HPK submet 2, HAV with HPK medial 1st met  No images are attached to the encounter.  Assessment:   1. Deformity of metatarsal bone of right foot    Plan:  Patient was evaluated and treated and all questions answered.  Wound right great toe sulcus; medial bunion -Wound remains healed. Debrided callus plantar foot. Attempted to fabricate custom spacer but it would not set. Could not remake because patient had to leave. Will re-make next visit.

## 2021-06-14 ENCOUNTER — Other Ambulatory Visit (HOSPITAL_COMMUNITY): Payer: Self-pay

## 2021-06-18 ENCOUNTER — Other Ambulatory Visit: Payer: Self-pay

## 2021-06-18 ENCOUNTER — Ambulatory Visit (INDEPENDENT_AMBULATORY_CARE_PROVIDER_SITE_OTHER): Payer: Medicare HMO | Admitting: Orthopaedic Surgery

## 2021-06-18 ENCOUNTER — Encounter: Payer: Self-pay | Admitting: Orthopaedic Surgery

## 2021-06-18 VITALS — BP 140/89 | HR 88

## 2021-06-18 DIAGNOSIS — T8789 Other complications of amputation stump: Secondary | ICD-10-CM

## 2021-06-18 DIAGNOSIS — M79605 Pain in left leg: Secondary | ICD-10-CM | POA: Diagnosis not present

## 2021-06-18 DIAGNOSIS — M79609 Pain in unspecified limb: Secondary | ICD-10-CM

## 2021-06-24 NOTE — Progress Notes (Signed)
Office Visit Note   Patient: Troy [name]           Date of Birth: Dec 07, 1965           MRN: [id number] Visit Date: 06/18/2021              Requested by: Seward Carol, MD [address] [place],  [place] [address] PCP: Seward Carol, MD   Assessment & Plan: Visit Diagnoses:  1. Painful amputation stump ([place])     Plan: Prescription given him go to [employer]otech and can work on a new socket since current 1 is now loose as he has had some atrophy of his stump.  He can follow-up with me as needed.  Follow-Up Instructions: No follow-ups on file.   Orders:  No orders of the defined types were placed in this encounter.  No orders of the defined types were placed in this encounter.     Procedures: No procedures performed   Clinical Data: No additional findings.   Subjective: Chief Complaint  Patient presents with   Lower Back - Follow-up    HPI 55 year old male returns and states that his back pain is resolved since last seen 05/27/2021.  He is Dealer with a cane.  He is requesting a [employer] prosthesis we can get new socket.  He is increased the sock ply but with some stump shrinkage that he has had a prosthesis no longer fits and is slipping.  Review of Systems 14 point system update unchanged from 05/27/2021.  Of note is renal failure and diabetes.  I did his left BKA and 2017.   Objective: Vital Signs: BP 140/89 (BP Location: Left Arm, Patient Position: Sitting, Cuff Size: Large)   Pulse 88   SpO2 94%   Physical Exam Constitutional:      Appearance: He is well-developed.  HENT:     Head: Normocephalic and atraumatic.     Right Ear: External ear normal.     Left Ear: External ear normal.  Eyes:     Pupils: Pupils are equal, round, and reactive to light.  Neck:     Thyroid: No thyromegaly.     Trachea: No tracheal deviation.  Cardiovascular:     Rate and Rhythm: Normal rate.  Pulmonary:     Effort: Pulmonary effort is normal.     Breath  sounds: No wheezing.  Abdominal:     General: Bowel sounds are normal.     Palpations: Abdomen is soft.  Musculoskeletal:     Cervical back: Neck supple.  Skin:    General: Skin is warm and dry.     Capillary Refill: Capillary refill takes less than 2 seconds.  Neurological:     Mental Status: He is alert and oriented to person, place, and time.  Psychiatric:        Behavior: Behavior normal.        Thought Content: Thought content normal.        Judgment: Judgment normal.    Ortho Exam no ulcers over his stump.  Opposite foot shows no ulceration.  He is ambulatory with his prosthesis and cane.  Specialty Comments:  No specialty comments available.  Imaging: No results found.   PMFS History: Patient Active Problem List   Diagnosis Date Noted   Personal history of colonic polyps 04/23/2021   Gout 09/25/2020   Morbid obesity ([place]) 09/25/2020   Painful amputation stump ([place]) 07/01/2019   Hyperlipidemia 09/07/2018   Diabetic foot ulcer ([place]) 09/07/2018  History of left below knee amputation ([place]) 03/26/2018   Dysphagia 09/01/2017   Special screening for malignant neoplasms, colon 09/01/2017   Esophageal reflux 09/01/2017   Acute osteomyelitis of left foot ([place])    Diabetic foot ([place]) 02/02/2016   Osteomyelitis ([place]) 02/02/2016   Osteomyelitis of foot, acute, left 02/02/2016   CKD (chronic kidney disease), stage II 02/02/2016   Anemia 02/02/2016   Kidney stones 11/30/2013   Cholelithiasis: PER CT ABD/PELVIS AND ABD [place] 11/30/13 11/30/2013   Acute renal failure ([place]) 11/29/2013   Acute pancreatitis 11/29/2013   Asthma exacerbation 11/09/2013   CAP (community acquired pneumonia) 07/18/2013   Diabetes ([place]) 07/18/2013   Hypertension    Past Medical History:  Diagnosis Date   Asthma    Bronchitis    CKD (chronic kidney disease)    Diabetes mellitus    Diabetic Charcot foot ([place])    Left   Gout    Hypertension    Kidney stones 11/30/2013   Neuromuscular disorder  (HCC)    Obesity    Pancreatitis    Polyneuropathy in diabetes ([place])    Sleep apnea     Family History  Problem Relation Age of Onset   Diabetes Mellitus II Father    Diabetes Father    Hypertension Father    Diabetes Mother    Hypertension Mother    CAD Neg Hx     Past Surgical History:  Procedure Laterality Date   AMPUTATION Left 02/06/2016   Procedure: AMPUTATION BELOW KNEE;  Surgeon: Marybelle Killings, MD;  Location: [facility];  Service: Orthopedics;  Laterality: Left;   CHOLECYSTECTOMY N/A 12/03/2013   Procedure: LAPAROSCOPIC CHOLECYSTECTOMY WITH INTRAOPERATIVE CHOLANGIOGRAM;  Surgeon: Rolm Bookbinder, MD;  Location: [facility];  Service: General;  Laterality: N/A;   COLONOSCOPY WITH PROPOFOL N/A 09/01/2017   Procedure: COLONOSCOPY WITH PROPOFOL;  Surgeon: Wilford Corner, MD;  [facility];  Service: Endoscopy;  Laterality: N/A;   COLONOSCOPY WITH PROPOFOL N/A 04/23/2021   Procedure: COLONOSCOPY WITH PROPOFOL;  Surgeon: Wilford Corner, MD;  [facility];  Service: Endoscopy;  Laterality: N/A;   ESOPHAGOGASTRODUODENOSCOPY (EGD) WITH PROPOFOL N/A 09/01/2017   Procedure: ESOPHAGOGASTRODUODENOSCOPY (EGD) WITH PROPOFOL;  Surgeon: Wilford Corner, MD;  [facility];  Service: Endoscopy;  Laterality: N/A;   NO PAST SURGERIES     POLYPECTOMY  04/23/2021   Procedure: POLYPECTOMY;  Surgeon: Wilford Corner, MD;  [facility];  Service: Endoscopy;;   Social History   Occupational History   Not on file  Tobacco Use   Smoking status: Never   Smokeless tobacco: Never  Substance and Sexual Activity   Alcohol use: Yes    Comment: occasionally   Drug use: No   Sexual activity: Not on file

## 2021-06-25 DIAGNOSIS — J45909 Unspecified asthma, uncomplicated: Secondary | ICD-10-CM | POA: Diagnosis not present

## 2021-06-25 DIAGNOSIS — Z79899 Other long term (current) drug therapy: Secondary | ICD-10-CM | POA: Diagnosis not present

## 2021-06-25 DIAGNOSIS — M1A9XX1 Chronic gout, unspecified, with tophus (tophi): Secondary | ICD-10-CM | POA: Diagnosis not present

## 2021-06-25 DIAGNOSIS — M25569 Pain in unspecified knee: Secondary | ICD-10-CM | POA: Diagnosis not present

## 2021-06-25 DIAGNOSIS — E119 Type 2 diabetes mellitus without complications: Secondary | ICD-10-CM | POA: Diagnosis not present

## 2021-06-25 DIAGNOSIS — M1990 Unspecified osteoarthritis, unspecified site: Secondary | ICD-10-CM | POA: Diagnosis not present

## 2021-06-28 ENCOUNTER — Ambulatory Visit: Payer: Medicare HMO | Admitting: Podiatry

## 2021-06-28 ENCOUNTER — Other Ambulatory Visit: Payer: Self-pay

## 2021-07-02 ENCOUNTER — Other Ambulatory Visit: Payer: Self-pay

## 2021-07-02 ENCOUNTER — Ambulatory Visit (INDEPENDENT_AMBULATORY_CARE_PROVIDER_SITE_OTHER): Payer: Medicare HMO | Admitting: Podiatry

## 2021-07-02 DIAGNOSIS — M21961 Unspecified acquired deformity of right lower leg: Secondary | ICD-10-CM | POA: Diagnosis not present

## 2021-07-02 NOTE — Progress Notes (Signed)
  Subjective:  Patient ID: Troy [name], male    DOB: 04-14-1966,  MRN: [id number]  Chief Complaint  Patient presents with   Foot Problem    F/U Rt bone deformity -pt states no issues -Tx: tube foam - no pain/redness/swelling    55 y.o. male presents for f/u. Denies new complaints.   Objective:  Physical Exam: warm, good capillary refill, nail exam onychomycosis of the toenails, DP pulses palpable, PT pulses palpable and protective sensation absent Left Foot: Hx BKA  Right Foot: HPK submet 2, HAV with HPK medial 1st met  No images are attached to the encounter.  Assessment:   1. Deformity of metatarsal bone of right foot    Plan:  Patient was evaluated and treated and all questions answered.  Wound right great toe sulcus; medial bunion -Wound remains healed. Again debrided callus plantar foot. Fabricated custom pedi-plast offloading device.  F/u in 1 month for recheck

## 2021-07-03 ENCOUNTER — Other Ambulatory Visit (HOSPITAL_COMMUNITY): Payer: Self-pay

## 2021-07-04 ENCOUNTER — Other Ambulatory Visit (HOSPITAL_COMMUNITY): Payer: Self-pay

## 2021-07-04 MED ORDER — TRAMADOL HCL 50 MG PO TABS
50.0000 mg | ORAL_TABLET | Freq: Two times a day (BID) | ORAL | 1 refills | Status: DC | PRN
  Filled 2021-07-04: qty 45, 23d supply, fill #0

## 2021-07-09 DIAGNOSIS — E113512 Type 2 diabetes mellitus with proliferative diabetic retinopathy with macular edema, left eye: Secondary | ICD-10-CM | POA: Diagnosis not present

## 2021-07-09 DIAGNOSIS — H3582 Retinal ischemia: Secondary | ICD-10-CM | POA: Diagnosis not present

## 2021-07-09 DIAGNOSIS — H40053 Ocular hypertension, bilateral: Secondary | ICD-10-CM | POA: Diagnosis not present

## 2021-07-09 DIAGNOSIS — E113591 Type 2 diabetes mellitus with proliferative diabetic retinopathy without macular edema, right eye: Secondary | ICD-10-CM | POA: Diagnosis not present

## 2021-07-10 ENCOUNTER — Telehealth: Payer: Self-pay | Admitting: Orthopaedic Surgery

## 2021-07-10 NOTE — Telephone Encounter (Signed)
Patient called advised he is at [facility] seeing [place] today. Patient said Mortimer Fries asked if Dr. [name] would amend the  06/18/2021 note stating that patient can get his prosthetic leg so that the insurance will cover it.   The number to contact Mortimer Fries is [phone]. The number to contact patient is [phone] .

## 2021-07-10 NOTE — Telephone Encounter (Signed)
Please see below and advise.

## 2021-07-10 NOTE — Telephone Encounter (Signed)
noted 

## 2021-07-12 ENCOUNTER — Encounter: Payer: Self-pay | Admitting: Orthopaedic Surgery

## 2021-07-12 ENCOUNTER — Ambulatory Visit (INDEPENDENT_AMBULATORY_CARE_PROVIDER_SITE_OTHER): Payer: Medicare HMO | Admitting: Orthopaedic Surgery

## 2021-07-12 ENCOUNTER — Other Ambulatory Visit: Payer: Self-pay

## 2021-07-12 VITALS — BP 131/84 | HR 94 | Ht 70.0 in | Wt 300.0 lb

## 2021-07-12 DIAGNOSIS — Z89512 Acquired absence of left leg below knee: Secondary | ICD-10-CM | POA: Diagnosis not present

## 2021-07-12 NOTE — Progress Notes (Signed)
Office Visit Note   Patient: Troy [name]           Date of Birth: July 14, 1966           MRN: [id number] Visit Date: 07/12/2021              Requested by: Seward Carol, MD [address] [place],  [place] [address] PCP: Seward Carol, MD   Assessment & Plan: Visit Diagnoses:  1. History of left below knee amputation [facility])     Plan: Patient needs a new below-knee prosthesis.  Recurrent problems with stump bleeding.   Was 55 years old.  Liner needs to be replaced.  We have maximized his current prosthesis and still having problems.  Follow-Up Instructions: No follow-ups on file.   Orders:  No orders of the defined types were placed in this encounter.  No orders of the defined types were placed in this encounter.     Procedures: No procedures performed   Clinical Data: No additional findings.   Subjective: Chief Complaint  Patient presents with   Left Leg - Pain    HPI 55 year old male returns with problems with his left BKA prosthesis.  Keeps having problems with some bleeding tip of his stump despite different ply.  Original socket replacement 06/26/2017 he has been using the same foot and the prosthesis since 2017 and has problems with foot wear.  His socket then padded they worked on the liner.  He has some redness callus patient been using pumice stone and persistent problems.  I have talked with the prosthetists and has time to get a new prosthesis for the patient.  Patient's community ambulator is got a 26 year old grandson living with him as well as with his wife.  He needs a prosthesis and currently current prosthesis causing recurrent skin problems.  He needs a new prosthesis before he develops osteomyelitis to the stump requiring further surgery.  Review of Systems updated unchanged.  Positive history of gout osteomyelitis of the foot diabetic ulcer left BKA.  No ulcer problems with his right foot.   Objective: Vital Signs: BP 131/84    Pulse  94    Ht 5\' 10"  (1.778 m)    Wt 300 lb (136.1 kg)    BMI 43.05 kg/m   Physical Exam Constitutional:      Appearance: He is well-developed.  HENT:     Head: Normocephalic and atraumatic.     Right Ear: External ear normal.     Left Ear: External ear normal.  Eyes:     Pupils: Pupils are equal, round, and reactive to light.  Neck:     Thyroid: No thyromegaly.     Trachea: No tracheal deviation.  Cardiovascular:     Rate and Rhythm: Normal rate.  Pulmonary:     Effort: Pulmonary effort is normal.     Breath sounds: No wheezing.  Abdominal:     General: Bowel sounds are normal.     Palpations: Abdomen is soft.  Musculoskeletal:     Cervical back: Neck supple.  Skin:    General: Skin is warm and dry.     Capillary Refill: Capillary refill takes less than 2 seconds.  Neurological:     Mental Status: He is alert and oriented to person, place, and time.  Psychiatric:        Behavior: Behavior normal.        Thought Content: Thought content normal.        Judgment:  Judgment normal.    Ortho Exam patient has a callus to the tip of the stump.  Tibia is appropriately beveled.  Currently tip of the stump is not bleeding.  Good capillary refill no cellulitis.  Specialty Comments:  No specialty comments available.  Imaging: No results found.   PMFS History: Patient Active Problem List   Diagnosis Date Noted   Personal history of colonic polyps 04/23/2021   Gout 09/25/2020   Morbid obesity ([place]) 09/25/2020   Painful amputation stump ([place]) 07/01/2019   Hyperlipidemia 09/07/2018   Diabetic foot ulcer ([place]) 09/07/2018   History of left below knee amputation ([place]) 03/26/2018   Dysphagia 09/01/2017   Special screening for malignant neoplasms, colon 09/01/2017   Esophageal reflux 09/01/2017   Acute osteomyelitis of left foot ([place])    Diabetic foot ([place]) 02/02/2016   Osteomyelitis ([place]) 02/02/2016   Osteomyelitis of foot, acute, left 02/02/2016   CKD (chronic kidney disease),  stage II 02/02/2016   Anemia 02/02/2016   Kidney stones 11/30/2013   Cholelithiasis: PER CT ABD/PELVIS AND ABD [place] 11/30/13 11/30/2013   Acute renal failure ([place]) 11/29/2013   Acute pancreatitis 11/29/2013   Asthma exacerbation 11/09/2013   CAP (community acquired pneumonia) 07/18/2013   Diabetes ([place]) 07/18/2013   Hypertension    Past Medical History:  Diagnosis Date   Asthma    Bronchitis    CKD (chronic kidney disease)    Diabetes mellitus    Diabetic Charcot foot ([place])    Left   Gout    Hypertension    Kidney stones 11/30/2013   Neuromuscular disorder (HCC)    Obesity    Pancreatitis    Polyneuropathy in diabetes ([place])    Sleep apnea     Family History  Problem Relation Age of Onset   Diabetes Mellitus II Father    Diabetes Father    Hypertension Father    Diabetes Mother    Hypertension Mother    CAD Neg Hx     Past Surgical History:  Procedure Laterality Date   AMPUTATION Left 02/06/2016   Procedure: AMPUTATION BELOW KNEE;  Surgeon: Marybelle Killings, MD;  Location: [facility];  Service: Orthopedics;  Laterality: Left;   CHOLECYSTECTOMY N/A 12/03/2013   Procedure: LAPAROSCOPIC CHOLECYSTECTOMY WITH INTRAOPERATIVE CHOLANGIOGRAM;  Surgeon: Rolm Bookbinder, MD;  Location: [facility];  Service: General;  Laterality: N/A;   COLONOSCOPY WITH PROPOFOL N/A 09/01/2017   Procedure: COLONOSCOPY WITH PROPOFOL;  Surgeon: Wilford Corner, MD;  [facility];  Service: Endoscopy;  Laterality: N/A;   COLONOSCOPY WITH PROPOFOL N/A 04/23/2021   Procedure: COLONOSCOPY WITH PROPOFOL;  Surgeon: Wilford Corner, MD;  [facility];  Service: Endoscopy;  Laterality: N/A;   ESOPHAGOGASTRODUODENOSCOPY (EGD) WITH PROPOFOL N/A 09/01/2017   Procedure: ESOPHAGOGASTRODUODENOSCOPY (EGD) WITH PROPOFOL;  Surgeon: Wilford Corner, MD;  [facility];  Service: Endoscopy;  Laterality: N/A;   NO PAST SURGERIES     POLYPECTOMY  04/23/2021   Procedure: POLYPECTOMY;  Surgeon: Wilford Corner, MD;  [facility];  Service: Endoscopy;;   Social History   Occupational History   Not on file  Tobacco Use   Smoking status: Never   Smokeless tobacco: Never  Substance and Sexual Activity   Alcohol use: Yes    Comment: occasionally   Drug use: No   Sexual activity: Not on file

## 2021-07-16 ENCOUNTER — Encounter (HOSPITAL_COMMUNITY): Payer: Self-pay | Admitting: Emergency Medicine

## 2021-07-16 ENCOUNTER — Other Ambulatory Visit: Payer: Self-pay

## 2021-07-16 ENCOUNTER — Ambulatory Visit (HOSPITAL_COMMUNITY)
Admission: EM | Admit: 2021-07-16 | Discharge: 2021-07-16 | Disposition: A | Discharge status: Home / Self Care | Payer: Medicare HMO | Attending: Urgent Care | Admitting: Urgent Care

## 2021-07-16 ENCOUNTER — Other Ambulatory Visit (HOSPITAL_COMMUNITY): Payer: Self-pay

## 2021-07-16 DIAGNOSIS — L739 Follicular disorder, unspecified: Secondary | ICD-10-CM | POA: Diagnosis not present

## 2021-07-16 DIAGNOSIS — M79605 Pain in left leg: Secondary | ICD-10-CM

## 2021-07-16 DIAGNOSIS — L02416 Cutaneous abscess of left lower limb: Secondary | ICD-10-CM | POA: Diagnosis not present

## 2021-07-16 DIAGNOSIS — Z89512 Acquired absence of left leg below knee: Secondary | ICD-10-CM

## 2021-07-16 MED ORDER — DOXYCYCLINE HYCLATE 100 MG PO CAPS
100.0000 mg | ORAL_CAPSULE | Freq: Two times a day (BID) | ORAL | 0 refills | Status: DC
  Filled 2021-07-16: qty 20, 10d supply, fill #0

## 2021-07-16 NOTE — ED Provider Notes (Signed)
Troy [name]   MRN: [id number] DOB: 04/03/1966  Subjective:   Troy [name] is a 55 y.o. male presenting for 2-day history of acute onset persistent swelling over the left leg near the site of his amputation.  Patient reports that he saw a white bump but has not had any particular drainage or bleeding.  Has not tried compresses.  No fevers.  He has had some swelling.  Still has sensation of the left leg.  Patient is not on dialysis.  No fevers, nausea, vomiting.  No current facility-administered medications for this encounter.  Current Outpatient Medications:    atorvastatin (LIPITOR) 10 MG tablet, Take 10 mg by mouth daily., Disp: , Rfl: 10   budesonide-formoterol (SYMBICORT) 160-4.5 MCG/ACT inhaler, Inhale 2 puffs into the lungs 2 (two) times daily., Disp: , Rfl:    gabapentin (NEURONTIN) 300 MG capsule, Take 300 mg by mouth at bedtime., Disp: , Rfl:    glimepiride (AMARYL) 4 MG tablet, Take 4 mg by mouth daily with breakfast., Disp: , Rfl:    lisinopril-hydrochlorothiazide (PRINZIDE,ZESTORETIC) 10-12.5 MG tablet, Take 1 tablet by mouth daily. , Disp: , Rfl: 2   metFORMIN (GLUCOPHAGE) 1000 MG tablet, Take 1,000 mg by mouth 2 (two) times daily., Disp: , Rfl: 10   Multiple Vitamin (MULTIVITAMIN WITH MINERALS) TABS tablet, Take 1 tablet by mouth daily., Disp: , Rfl:    pantoprazole (PROTONIX) 40 MG tablet, Take 40 mg by mouth daily. , Disp: , Rfl: 0   tetrahydrozoline-zinc (VISINE-AC) 0.05-0.25 % ophthalmic solution, Place 2 drops into both eyes 3 (three) times daily as needed (dry eyes)., Disp: , Rfl:    traMADol (ULTRAM) 50 MG tablet, Take 50 mg by mouth daily as needed for severe pain., Disp: , Rfl:    acetaminophen (TYLENOL) 500 MG tablet, Take 1,000 mg by mouth every 8 (eight) hours as needed for moderate pain., Disp: , Rfl:    albuterol (PROVENTIL HFA;VENTOLIN HFA) 108 (90 Base) MCG/ACT inhaler, Inhale 1-2 puffs into the lungs every 6 (six) hours as needed for  wheezing or shortness of breath., Disp: 1 Inhaler, Rfl: 0   albuterol (PROVENTIL) (2.5 MG/3ML) 0.083% nebulizer solution, USE 3 ML PER NEBULIZATION EVERY 4 HOURS AS NEEDED FOR WHEEZING, Disp: 75 mL, Rfl: 1   Alcohol Swabs (B-D SINGLE USE SWABS REGULAR) PADS, , Disp: , Rfl:    allopurinol (ZYLOPRIM) 100 MG tablet, , Disp: , Rfl:    aspirin 81 MG EC tablet, Take 81 mg by mouth daily. Swallow whole., Disp: , Rfl:    Blood Glucose Calibration (ACCU-CHEK AVIVA) SOLN, , Disp: , Rfl:    Blood Glucose Monitoring Suppl (ACCU-CHEK AVIVA PLUS) w/Device KIT, USE TO TEST YOUR BLOOD SUGAR THREE TIMES A DAY, Disp: , Rfl:    Colchicine 0.6 MG CAPS, Take 0.6 mg by mouth daily as needed (gout pain)., Disp: , Rfl:    CONTOUR NEXT TEST test strip, 1 each daily as needed., Disp: , Rfl: 5   cyclobenzaprine (FLEXERIL) 10 MG tablet, Take 1 tablet (10 mg total) by mouth 3 (three) times daily as needed for muscle spasms., Disp: 30 tablet, Rfl: 0   dorzolamide-timolol (COSOPT) 22.3-6.8 MG/ML ophthalmic solution, INSTILL 1 DROP INTO BOTH EYES TWICE A DAY, Disp: 10 mL, Rfl: 3   DROPLET PEN NEEDLES 31G X 6 MM MISC, , Disp: , Rfl:    fluticasone (FLONASE) 50 MCG/ACT nasal spray, USE ONE SPRAY IN EACH NOSTRIL ONCE DAILY 30 60 (Patient taking differently: Place 1  spray into both nostrils daily.), Disp: 16 g, Rfl: 5   folic acid (FOLVITE) 1 MG tablet, TAKE 1 TABLET BY MOUTH ONCE A DAY, Disp: 30 tablet, Rfl: 3   guaiFENesin-codeine (ROBITUSSIN AC) 100-10 MG/5ML syrup, Take 5 mLs by mouth 3 (three) times daily as needed for cough. (Patient not taking: No sig reported), Disp: 118 mL, Rfl: 0   Insulin Glargine (BASAGLAR KWIKPEN) 100 UNIT/ML, Inject 25 Units into the skin at bedtime., Disp: , Rfl:    methotrexate (RHEUMATREX) 2.5 MG tablet, TAKE 4 TABLETS AT ONCE BY MOUTH ONCE A WEEK, Disp: 16 tablet, Rfl: 3   MICROLET LANCETS MISC, 1 each daily as needed., Disp: , Rfl: 5   pegloticase (KRYSTEXXA) 8 MG/ML injection, Inject 8 mg into the  vein every 14 (fourteen) days., Disp: , Rfl:    polyethylene glycol-electrolytes (NULYTELY) 420 g solution, Take 4,000 mLs by mouth as directed, Disp: 4000 mL, Rfl: 0   sildenafil (VIAGRA) 100 MG tablet, Take 100 mg by mouth as needed for erectile dysfunction., Disp: , Rfl:    traMADol (ULTRAM) 50 MG tablet, Take 1 tablet (50 mg total) by mouth every 12 (twelve) hours as needed., Disp: 45 tablet, Rfl: 1   No Known Allergies  Past Medical History:  Diagnosis Date   Asthma    Bronchitis    CKD (chronic kidney disease)    Diabetes mellitus    Diabetic Charcot foot ([place])    Left   Gout    Hypertension    Kidney stones 11/30/2013   Neuromuscular disorder (HCC)    Obesity    Pancreatitis    Polyneuropathy in diabetes [facility])    Sleep apnea      Past Surgical History:  Procedure Laterality Date   AMPUTATION Left 02/06/2016   Procedure: AMPUTATION BELOW KNEE;  Surgeon: Marybelle Killings, MD;  Location: [facility];  Service: Orthopedics;  Laterality: Left;   CHOLECYSTECTOMY N/A 12/03/2013   Procedure: LAPAROSCOPIC CHOLECYSTECTOMY WITH INTRAOPERATIVE CHOLANGIOGRAM;  Surgeon: Rolm Bookbinder, MD;  Location: [facility];  Service: General;  Laterality: N/A;   COLONOSCOPY WITH PROPOFOL N/A 09/01/2017   Procedure: COLONOSCOPY WITH PROPOFOL;  Surgeon: Wilford Corner, MD;  [facility];  Service: Endoscopy;  Laterality: N/A;   COLONOSCOPY WITH PROPOFOL N/A 04/23/2021   Procedure: COLONOSCOPY WITH PROPOFOL;  Surgeon: Wilford Corner, MD;  [facility];  Service: Endoscopy;  Laterality: N/A;   ESOPHAGOGASTRODUODENOSCOPY (EGD) WITH PROPOFOL N/A 09/01/2017   Procedure: ESOPHAGOGASTRODUODENOSCOPY (EGD) WITH PROPOFOL;  Surgeon: Wilford Corner, MD;  [facility];  Service: Endoscopy;  Laterality: N/A;   NO PAST SURGERIES     POLYPECTOMY  04/23/2021   Procedure: POLYPECTOMY;  Surgeon: Wilford Corner, MD;  [facility];  Service: Endoscopy;;    Family History  Problem  Relation Age of Onset   Diabetes Mellitus II Father    Diabetes Father    Hypertension Father    Diabetes Mother    Hypertension Mother    CAD Neg Hx     Social History   Tobacco Use   Smoking status: Never   Smokeless tobacco: Never  Substance Use Topics   Alcohol use: Yes    Comment: occasionally   Drug use: No    ROS   Objective:   Vitals: BP (!) 147/93 (BP Location: Right Arm)    Pulse 87    Temp 98.1 F (36.7 C) (Oral)    Resp 18    SpO2 94%   Physical Exam Constitutional:  General: He is not in acute distress.    Appearance: Normal appearance. He is well-developed and normal weight. He is not ill-appearing, toxic-appearing or diaphoretic.  HENT:     Head: Normocephalic and atraumatic.     Right Ear: External ear normal.     Left Ear: External ear normal.     Nose: Nose normal.     Mouth/Throat:     Pharynx: Oropharynx is clear.  Eyes:     General: No scleral icterus.       Right eye: No discharge.        Left eye: No discharge.     Extraocular Movements: Extraocular movements intact.     Pupils: Pupils are equal, round, and reactive to light.  Cardiovascular:     Rate and Rhythm: Normal rate.  Pulmonary:     Effort: Pulmonary effort is normal.  Musculoskeletal:     Cervical back: Normal range of motion.  Skin:      Neurological:     Mental Status: He is alert and oriented to person, place, and time.  Psychiatric:        Mood and Affect: Mood normal.        Behavior: Behavior normal.        Thought Content: Thought content normal.        Judgment: Judgment normal.    PROCEDURE NOTE: I&D of Abscess Verbal consent obtained. Site cleansed with Betadine. Incision of 1/2cm was made using an 11 blade, 1cc expressed consisting of a mixture of pus and serosanguinous fluid. Wound cavity was explored with curved hemostats and loculations loosened. Cleansed and dressed.   Assessment and Plan :   PDMP not reviewed this encounter.  1. Folliculitis    2. Left leg pain   3. History of below knee amputation, left ([place])   4. Cutaneous abscess of left lower extremity    Suspect the patient started out with folliculitis and progressed to an abscess.  Successful I&D performed.  Start doxycycline for the infection, Tylenol for pain control.  Reviewed wound care. Counseled patient on potential for adverse effects with medications prescribed/recommended today, ER and return-to-clinic precautions discussed, patient verbalized understanding.    Jaynee Eagles, PA-C 07/16/21 1355

## 2021-07-16 NOTE — Discharge Instructions (Addendum)
Please change your dressing 2-3 times daily. Do not apply any ointments or creams. Each time you change your dressing, make sure you clean gently around the perimeter of the wound with gentle soap and warm water. Pat your wound dry and let it air out if possible for 1-2 hours before reapplying another dressing.

## 2021-07-16 NOTE — ED Triage Notes (Signed)
Patient c/o abscess x 1 day.   Patient endorses " I have a white bump at the end of my stump, on my left leg.".   Patient endorses swelling. Patient endorses some pain.   Patient endorses sanguinous drainage.   Patient used antibacterial ointment with no relief of symptoms.

## 2021-07-24 DIAGNOSIS — E1140 Type 2 diabetes mellitus with diabetic neuropathy, unspecified: Secondary | ICD-10-CM | POA: Diagnosis not present

## 2021-07-24 DIAGNOSIS — F3341 Major depressive disorder, recurrent, in partial remission: Secondary | ICD-10-CM | POA: Diagnosis not present

## 2021-07-24 DIAGNOSIS — K219 Gastro-esophageal reflux disease without esophagitis: Secondary | ICD-10-CM | POA: Diagnosis not present

## 2021-07-24 DIAGNOSIS — J45901 Unspecified asthma with (acute) exacerbation: Secondary | ICD-10-CM | POA: Diagnosis not present

## 2021-07-24 DIAGNOSIS — E11621 Type 2 diabetes mellitus with foot ulcer: Secondary | ICD-10-CM | POA: Diagnosis not present

## 2021-07-24 DIAGNOSIS — E7800 Pure hypercholesterolemia, unspecified: Secondary | ICD-10-CM | POA: Diagnosis not present

## 2021-07-24 DIAGNOSIS — N182 Chronic kidney disease, stage 2 (mild): Secondary | ICD-10-CM | POA: Diagnosis not present

## 2021-07-24 DIAGNOSIS — E1151 Type 2 diabetes mellitus with diabetic peripheral angiopathy without gangrene: Secondary | ICD-10-CM | POA: Diagnosis not present

## 2021-07-24 DIAGNOSIS — I10 Essential (primary) hypertension: Secondary | ICD-10-CM | POA: Diagnosis not present

## 2021-07-30 ENCOUNTER — Other Ambulatory Visit: Payer: Self-pay

## 2021-07-30 ENCOUNTER — Ambulatory Visit (INDEPENDENT_AMBULATORY_CARE_PROVIDER_SITE_OTHER): Payer: Medicare HMO | Admitting: Podiatry

## 2021-07-30 DIAGNOSIS — L97419 Non-pressure chronic ulcer of right heel and midfoot with unspecified severity: Secondary | ICD-10-CM | POA: Diagnosis not present

## 2021-07-30 DIAGNOSIS — M21961 Unspecified acquired deformity of right lower leg: Secondary | ICD-10-CM | POA: Diagnosis not present

## 2021-07-30 DIAGNOSIS — E08621 Diabetes mellitus due to underlying condition with foot ulcer: Secondary | ICD-10-CM | POA: Diagnosis not present

## 2021-07-30 NOTE — Progress Notes (Signed)
Subjective:  °Patient ID: Troy [name], male    DOB: 05/24/1966,  MRN: [id number] ° °Chief Complaint  °Patient presents with  ° Bunions  °  4 week bunion follow up  ° ° °55 y.o. male presents for f/u. Denies new complaints. States he would like to discuss new DM shoes. No pain today. Has been keeping the area clean. °Objective:  °Physical Exam: warm, good capillary refill, nail exam onychomycosis of the toenails, DP pulses palpable, PT pulses palpable and protective sensation absent °Left Foot: Hx BKA  °Right Foot: HPK submet 2, HAV with HPK medial 1st met ° °No images are attached to the encounter. ° °Assessment:  ° °1. Deformity of metatarsal bone of right foot   °2. Diabetic ulcer of right midfoot associated with diabetes mellitus due to underlying condition, unspecified ulcer stage (HCC)   ° ° °Plan:  °Patient was evaluated and treated and all questions answered. ° °Wound right great toe sulcus; medial bunion °-Remains healed. Gently debrided HPK. Provided additional tubefoam for offloading. Will get new appt for DM shoes. ° °F/u in 1 month for recheck ° ° °

## 2021-08-02 ENCOUNTER — Encounter (HOSPITAL_COMMUNITY): Payer: Self-pay

## 2021-08-02 ENCOUNTER — Other Ambulatory Visit: Payer: Self-pay

## 2021-08-02 ENCOUNTER — Ambulatory Visit (HOSPITAL_COMMUNITY)
Admission: EM | Admit: 2021-08-02 | Discharge: 2021-08-02 | Disposition: A | Discharge status: Home / Self Care | Payer: Medicare HMO | Attending: Emergency Medicine | Admitting: Emergency Medicine

## 2021-08-02 DIAGNOSIS — L02213 Cutaneous abscess of chest wall: Secondary | ICD-10-CM | POA: Insufficient documentation

## 2021-08-02 DIAGNOSIS — J069 Acute upper respiratory infection, unspecified: Secondary | ICD-10-CM | POA: Diagnosis not present

## 2021-08-02 DIAGNOSIS — Z20822 Contact with and (suspected) exposure to covid-19: Secondary | ICD-10-CM | POA: Insufficient documentation

## 2021-08-02 DIAGNOSIS — R059 Cough, unspecified: Secondary | ICD-10-CM | POA: Diagnosis present

## 2021-08-02 MED ORDER — PROMETHAZINE-DM 6.25-15 MG/5ML PO SYRP: 5.0000 mL | ORAL_SOLUTION | Freq: Four times a day (QID) | ORAL | 0 refills | Status: DC | PRN

## 2021-08-02 MED ORDER — DOXYCYCLINE HYCLATE 100 MG PO CAPS: 100.0000 mg | ORAL_CAPSULE | Freq: Two times a day (BID) | ORAL | 0 refills | Status: DC

## 2021-08-02 NOTE — ED Triage Notes (Signed)
Pt presents with non productive cough X 4 days and abscess to right side of chest.

## 2021-08-02 NOTE — Discharge Instructions (Signed)
Your symptoms today are most likely being caused by a virus and should steadily improve in time it can take up to 7 to 10 days before you truly start to see a turnaround however things will get better.  More so your cough is related to irritation of your throat and upper airways, this must heal before your cough begins to resolve.  Your COVID test is pending, if positive you will receive a phone call and antiviral medicine sent in for use.    You can take Tylenol and/or Ibuprofen as needed for fever reduction and pain relief.  You may use cough syrup every 6 hours as needed, be mindful of this medication make you drowsy, if it makes you too drowsy you may take it at bedtime   For cough: honey 1/2 to 1 teaspoon (you can dilute the honey in water or another fluid).  You can also use guaifenesin for cough. You can use a humidifier for chest congestion and cough.  If you don't have a humidifier, you can sit in the bathroom with the hot shower running.  May attempt use of a teaspoon of honey to soothe the throat, use sparingly as you have diabetes.    It is important to stay hydrated: drink plenty of fluids (water, gatorade/powerade/pedialyte, juices, or teas) to keep your throat moisturized and help further relieve irritation/discomfort.   Take doxycycline twice a day for the next 7 days  Hold warm-hot compresses to affected area at least 4 times a day, this helps to facilitate draining, the more the better  Please return for evaluation for increased swelling, increased tenderness or pain, non healing site, non draining site, you begin to have fever or chills   We reviewed the etiology of recurrent abscesses of skin.  Skin abscesses are collections of pus within the dermis and deeper skin tissues. Skin abscesses manifest as painful, tender, fluctuant, and erythematous nodules, frequently surmounted by a pustule and surrounded by a rim of erythematous swelling.  Spontaneous drainage of purulent material  may occur.  Fever can occur on occasion.    -Skin abscesses can develop in healthy individuals with no predisposing conditions other than skin or nasal carriage of Staphylococcus aureus.  Individuals in close contact with others who have active infection with skin abscesses are at increased risk which is likely to explain why twin brother has similar episodes.   In addition, any process leading to a breach in the skin barrier can also predispose to the development of a skin abscesses, such as atopic dermatitis.

## 2021-08-02 NOTE — ED Provider Notes (Signed)
[place]    CSN: [id number] Arrival date & time: 08/02/21  0810      History   Chief Complaint Chief Complaint  Patient presents with   Abscess   Cough    HPI Troy [name] is a 56 y.o. male.   Patient presents with nasal congestion, rhinorrhea, sore throat and nonproductive dry cough for 3 days.  Cough is worse at nighttime interfering with sleep.  Will rating food and liquids.  Has attempted use of Tessalon, Mucinex and cough drops which have not been effective.  Known sick contact in household who was not positive for flu or COVID when tested .  Denies shortness of breath, wheezing, fever, chills, body aches, abdominal pain, nausea,, diarrhea, ear pain or headaches.  Patient presents with boil under right breast for 5 days.  Has become painful with movement.  Attempted use of heat over area which has caused it to begin to drain.  Denies fever, chills.      Past Medical History:  Diagnosis Date   Asthma    Bronchitis    CKD (chronic kidney disease)    Diabetes mellitus    Diabetic Charcot foot ([place])    Left   Gout    Hypertension    Kidney stones 11/30/2013   Neuromuscular disorder ([place])    Obesity    Pancreatitis    Polyneuropathy in diabetes [facility])    Sleep apnea     Patient Active Problem List   Diagnosis Date Noted   Personal history of colonic polyps 04/23/2021   Gout 09/25/2020   Morbid obesity ([place]) 09/25/2020   Painful amputation stump ([place]) 07/01/2019   Hyperlipidemia 09/07/2018   Diabetic foot ulcer ([place]) 09/07/2018   History of left below knee amputation ([place]) 03/26/2018   Dysphagia 09/01/2017   Special screening for malignant neoplasms, colon 09/01/2017   Esophageal reflux 09/01/2017   Acute osteomyelitis of left foot ([place])    Diabetic foot ([place]) 02/02/2016   Osteomyelitis ([place]) 02/02/2016   Osteomyelitis of foot, acute, left 02/02/2016   CKD (chronic kidney disease), stage II 02/02/2016   Anemia 02/02/2016   Kidney stones  11/30/2013   Cholelithiasis: PER CT ABD/PELVIS AND ABD [place] 11/30/13 11/30/2013   Acute renal failure ([place]) 11/29/2013   Acute pancreatitis 11/29/2013   Asthma exacerbation 11/09/2013   CAP (community acquired pneumonia) 07/18/2013   Diabetes ([place]) 07/18/2013   Hypertension     Past Surgical History:  Procedure Laterality Date   AMPUTATION Left 02/06/2016   Procedure: AMPUTATION BELOW KNEE;  Surgeon: Marybelle Killings, MD;  Location: [place];  Service: Orthopedics;  Laterality: Left;   CHOLECYSTECTOMY N/A 12/03/2013   Procedure: LAPAROSCOPIC CHOLECYSTECTOMY WITH INTRAOPERATIVE CHOLANGIOGRAM;  Surgeon: Rolm Bookbinder, MD;  Location: [place];  Service: General;  Laterality: N/A;   COLONOSCOPY WITH PROPOFOL N/A 09/01/2017   Procedure: COLONOSCOPY WITH PROPOFOL;  Surgeon: Wilford Corner, MD;  Location: [facility];  Service: Endoscopy;  Laterality: N/A;   COLONOSCOPY WITH PROPOFOL N/A 04/23/2021   Procedure: COLONOSCOPY WITH PROPOFOL;  Surgeon: Wilford Corner, MD;  Location: [facility];  Service: Endoscopy;  Laterality: N/A;   ESOPHAGOGASTRODUODENOSCOPY (EGD) WITH PROPOFOL N/A 09/01/2017   Procedure: ESOPHAGOGASTRODUODENOSCOPY (EGD) WITH PROPOFOL;  Surgeon: Wilford Corner, MD;  Location: [facility];  Service: Endoscopy;  Laterality: N/A;   NO PAST SURGERIES     POLYPECTOMY  04/23/2021   Procedure: POLYPECTOMY;  Surgeon: Wilford Corner, MD;  Location: [facility];  Service: Endoscopy;;       Home  Medications    Prior to Admission medications   Medication Sig Start Date End Date Taking? Authorizing Provider  acetaminophen (TYLENOL) 500 MG tablet Take 1,000 mg by mouth every 8 (eight) hours as needed for moderate pain.    [provider]  albuterol (PROVENTIL HFA;VENTOLIN HFA) 108 (90 Base) MCG/ACT inhaler Inhale 1-2 puffs into the lungs every 6 (six) hours as needed for wheezing or shortness of breath. 08/25/15   Hyman Bible, PA-C  albuterol (PROVENTIL) (2.5 MG/3ML) 0.083%  nebulizer solution USE 3 ML PER NEBULIZATION EVERY 4 HOURS AS NEEDED FOR WHEEZING 06/28/20 06/28/21  Katy Apo, NP  Alcohol Swabs (B-D SINGLE USE SWABS REGULAR) PADS  07/29/19   [provider]  allopurinol (ZYLOPRIM) 100 MG tablet  09/21/19   [provider]  aspirin 81 MG EC tablet Take 81 mg by mouth daily. Swallow whole.    [provider]  atorvastatin (LIPITOR) 10 MG tablet Take 10 mg by mouth daily. 03/06/17   [provider]  Blood Glucose Calibration (ACCU-CHEK AVIVA) SOLN  07/29/19   [provider]  Blood Glucose Monitoring Suppl (ACCU-CHEK AVIVA PLUS) w/Device KIT USE TO TEST YOUR BLOOD SUGAR THREE TIMES A DAY 01/12/19   [provider]  budesonide-formoterol (SYMBICORT) 160-4.5 MCG/ACT inhaler Inhale 2 puffs into the lungs 2 (two) times daily.    [provider]  Colchicine 0.6 MG CAPS Take 0.6 mg by mouth daily as needed (gout pain). 02/07/19   [provider]  CONTOUR NEXT TEST test strip 1 each daily as needed. 03/24/17   [provider]  cyclobenzaprine (FLEXERIL) 10 MG tablet Take 1 tablet (10 mg total) by mouth 3 (three) times daily as needed for muscle spasms. 05/27/21   Marybelle Killings, MD  dorzolamide-timolol (COSOPT) 22.3-6.8 MG/ML ophthalmic solution INSTILL 1 DROP INTO BOTH EYES TWICE A DAY 10/09/20 10/09/21  Warden Fillers, MD  doxycycline (VIBRAMYCIN) 100 MG capsule Take 1 capsule (100 mg total) by mouth 2 (two) times daily. 07/16/21   Jaynee Eagles, PA-C  DROPLET PEN NEEDLES 31G X 6 MM MISC  07/29/19   [provider]  fluticasone (FLONASE) 50 MCG/ACT nasal spray USE ONE SPRAY IN EACH NOSTRIL ONCE DAILY 30 60 Patient taking differently: Place 1 spray into both nostrils daily. 06/29/20 06/29/21  Seward Carol, MD  folic acid (FOLVITE) 1 MG tablet TAKE 1 TABLET BY MOUTH ONCE A DAY 03/12/21     gabapentin (NEURONTIN) 300 MG capsule Take 300 mg by mouth at bedtime. 02/03/20   [provider]  glimepiride (AMARYL) 4 MG tablet Take 4 mg by mouth daily with breakfast.    [provider]  guaiFENesin-codeine (ROBITUSSIN AC) 100-10 MG/5ML syrup Take 5 mLs by mouth 3 (three) times daily as needed for cough. Patient not taking: No sig reported 03/15/21   Katy Apo, NP  Insulin Glargine [facility]) 100 UNIT/ML Inject 25 Units into the skin at bedtime.    [provider]  lisinopril-hydrochlorothiazide (PRINZIDE,ZESTORETIC) 10-12.5 MG tablet Take 1 tablet by mouth daily.  05/06/16   [provider]  metFORMIN (GLUCOPHAGE) 1000 MG tablet Take 1,000 mg by mouth 2 (two) times daily. 03/16/17   [provider]  methotrexate (RHEUMATREX) 2.5 MG tablet TAKE 4 TABLETS AT ONCE BY MOUTH ONCE A WEEK 10/26/20 10/26/21  Valinda Party, MD  MICROLET LANCETS MISC 1 each daily as needed. 02/16/17   [provider]  Multiple Vitamin (MULTIVITAMIN WITH MINERALS) TABS tablet Take 1  tablet by mouth daily.    [provider]  pantoprazole (PROTONIX) 40 MG tablet Take 40 mg by mouth daily.  06/11/16   [provider]  pegloticase (KRYSTEXXA) 8 MG/ML injection Inject 8 mg into the vein every 14 (fourteen) days.    [provider]  polyethylene glycol-electrolytes (NULYTELY) 420 g solution Take 4,000 mLs by mouth as directed 04/09/21     sildenafil (VIAGRA) 100 MG tablet Take 100 mg by mouth as needed for erectile dysfunction. 01/04/19   [provider]  tetrahydrozoline-zinc (VISINE-AC) 0.05-0.25 % ophthalmic solution Place 2 drops into both eyes 3 (three) times daily as needed (dry eyes).    [provider]  traMADol (ULTRAM) 50 MG tablet Take 50 mg by mouth daily as needed for severe pain. 12/02/18   [provider]  traMADol (ULTRAM) 50 MG tablet Take 1 tablet (50 mg total) by mouth every 12 (twelve) hours as needed. 07/04/21       Family History Family History  Problem Relation Age of Onset   Diabetes  Mellitus II Father    Diabetes Father    Hypertension Father    Diabetes Mother    Hypertension Mother    CAD Neg Hx     Social History Social History   Tobacco Use   Smoking status: Never   Smokeless tobacco: Never  Substance Use Topics   Alcohol use: Yes    Comment: occasionally   Drug use: No     Allergies   Patient has no known allergies.   Review of Systems Review of Systems  Constitutional: Negative.   HENT:  Positive for congestion, rhinorrhea and sore throat. Negative for dental problem, drooling, ear discharge, ear pain, facial swelling, hearing loss, mouth sores, nosebleeds, postnasal drip, sinus pressure, sinus pain, sneezing, tinnitus, trouble swallowing and voice change.   Respiratory:  Positive for cough. Negative for apnea, choking, chest tightness, shortness of breath, wheezing and stridor.   Cardiovascular: Negative.   Gastrointestinal: Negative.   Skin:  Positive for wound. Negative for color change, pallor and rash.  Neurological: Negative.     Physical Exam Triage Vital Signs ED Triage Vitals  Enc Vitals Group     BP 08/02/21 0836 (!) 154/93     Pulse Rate 08/02/21 0836 90     Resp 08/02/21 0836 17     Temp 08/02/21 0836 98.7 F (37.1 C)     Temp Source 08/02/21 0836 Oral     SpO2 08/02/21 0836 93 %     Weight --      Height --      Head Circumference --      Peak Flow --      Pain Score 08/02/21 0839 5     Pain Loc --      Pain Edu? --      Excl. in [place]? --    No data found.  Updated Vital Signs BP (!) 154/93 (BP Location: Left Arm)    Pulse 90    Temp 98.7 F (37.1 C) (Oral)    Resp 17    SpO2 93%   Visual Acuity Right Eye Distance:   Left Eye Distance:   Bilateral Distance:    Right Eye Near:   Left Eye Near:    Bilateral Near:     Physical Exam Constitutional:      Appearance: Normal appearance. He is normal weight.  HENT:     Head: Normocephalic.     Right Ear: Tympanic  membrane, ear canal and external ear normal.      Left Ear: Tympanic membrane, ear canal and external ear normal.     Nose: Congestion and rhinorrhea present.     Mouth/Throat:     Mouth: Mucous membranes are moist.     Pharynx: Posterior oropharyngeal erythema present.     Tonsils: No tonsillar exudate. 1+ on the right. 1+ on the left.  Eyes:     Extraocular Movements: Extraocular movements intact.  Cardiovascular:     Rate and Rhythm: Normal rate and regular rhythm.     Pulses: Normal pulses.     Heart sounds: Normal heart sounds.  Pulmonary:     Effort: Pulmonary effort is normal.     Breath sounds: Normal breath sounds.  Chest:       Comments: 1 cm abscess present on the right chest wall under the breast line, small amount of puslike drainage, tender to touch  Neurological:     Mental Status: He is alert and oriented to person, place, and time. Mental status is at baseline.  Psychiatric:        Mood and Affect: Mood normal.        Behavior: Behavior normal.     UC Treatments / Results  Labs (all labs ordered are listed, but only abnormal results are displayed) Labs Reviewed - No data to display  EKG   Radiology No results found.  Procedures Procedures (including critical care time)  Medications Ordered in UC Medications - No data to display  Initial Impression / Assessment and Plan / UC Course  I have reviewed the triage vital signs and the nursing notes.  Pertinent labs & imaging results that were available during my care of the patient were reviewed by me and considered in my medical decision making (see chart for details).  Abscess of chest wall Viral URI with cough  1.  Area has already begun to drain, small amount of drainage noted during exam, will defer I&D at this time, discussed with patient, prescribed doxycycline for 7-day course, advised warm compresses at least 4 times a day, given strict return precautions for nonhealing site to return to urgent care for reevaluation 2.  Etiology of cough is  most likely viral and related to upper respiratory irritation, discussed with patient, requesting COVID test, will oblige, labs pending, patient already has visiting tablet at home, will prescribe Promethazine DM to assist with coughing and sleeping, in addition advised teaspoon of honey, humidifier and watchful waiting, lungs are clear on exam and O2 saturations 93% on room air, will defer use of steroid course at this time, patient has inhaler at home, given strict return precautions for when to follow-up Final Clinical Impressions(s) / UC Diagnoses   Final diagnoses:  None   Discharge Instructions   None    ED Prescriptions   None    PDMP not reviewed this encounter.   Hans Eden, [place] 08/02/21 [phone]

## 2021-08-03 LAB — SARS CORONAVIRUS 2 (TAT 6-24 HRS): SARS Coronavirus 2: NEGATIVE

## 2021-08-23 DIAGNOSIS — I10 Essential (primary) hypertension: Secondary | ICD-10-CM | POA: Diagnosis not present

## 2021-08-23 DIAGNOSIS — E1140 Type 2 diabetes mellitus with diabetic neuropathy, unspecified: Secondary | ICD-10-CM | POA: Diagnosis not present

## 2021-08-23 DIAGNOSIS — E7800 Pure hypercholesterolemia, unspecified: Secondary | ICD-10-CM | POA: Diagnosis not present

## 2021-08-23 DIAGNOSIS — E1151 Type 2 diabetes mellitus with diabetic peripheral angiopathy without gangrene: Secondary | ICD-10-CM | POA: Diagnosis not present

## 2021-08-23 DIAGNOSIS — E11621 Type 2 diabetes mellitus with foot ulcer: Secondary | ICD-10-CM | POA: Diagnosis not present

## 2021-08-23 DIAGNOSIS — N182 Chronic kidney disease, stage 2 (mild): Secondary | ICD-10-CM | POA: Diagnosis not present

## 2021-08-23 DIAGNOSIS — K219 Gastro-esophageal reflux disease without esophagitis: Secondary | ICD-10-CM | POA: Diagnosis not present

## 2021-08-23 DIAGNOSIS — F3341 Major depressive disorder, recurrent, in partial remission: Secondary | ICD-10-CM | POA: Diagnosis not present

## 2021-08-23 DIAGNOSIS — J45909 Unspecified asthma, uncomplicated: Secondary | ICD-10-CM | POA: Diagnosis not present

## 2021-08-27 ENCOUNTER — Other Ambulatory Visit: Payer: Self-pay

## 2021-08-27 ENCOUNTER — Ambulatory Visit: Payer: Medicare HMO | Admitting: Podiatry

## 2021-08-27 ENCOUNTER — Ambulatory Visit: Payer: Medicare HMO

## 2021-08-27 DIAGNOSIS — L600 Ingrowing nail: Secondary | ICD-10-CM | POA: Diagnosis not present

## 2021-08-27 DIAGNOSIS — R6889 Other general symptoms and signs: Secondary | ICD-10-CM | POA: Diagnosis not present

## 2021-08-27 DIAGNOSIS — E1142 Type 2 diabetes mellitus with diabetic polyneuropathy: Secondary | ICD-10-CM

## 2021-08-27 DIAGNOSIS — Z89432 Acquired absence of left foot: Secondary | ICD-10-CM

## 2021-08-27 DIAGNOSIS — M21961 Unspecified acquired deformity of right lower leg: Secondary | ICD-10-CM

## 2021-08-27 NOTE — Progress Notes (Signed)
Subjective:  Patient ID: Troy [name], male    DOB: Apr 27, 1966,  MRN: [id number]  No chief complaint on file.   56 y.o. male presents for f/u. Denies new complaints. Was fitted for new shoes today. Objective:  Physical Exam: warm, good capillary refill, nail exam onychomycosis of the toenails, DP pulses palpable, PT pulses palpable and protective sensation absent Left Foot: Hx BKA  Right Foot: HPK submet 2, HAV with HPK medial 1st met  No images are attached to the encounter.  Assessment:   1. Deformity of metatarsal bone of right foot   2. Callus     Plan:  Patient was evaluated and treated and all questions answered.  Wound right great toe sulcus; medial bunion -Remains healed. Debrided courtesy to prevent ulceration  Ingrown nail right hallux -On exam found to have an ingrowing nails of the hallux. Gently debrided nail in slant back fashion both borders. Skin raw post removal but no bleeding noted. Applied neosporin and band-aid. Patient does not need to continue doing so daily.  Return in about 6 weeks (around 10/08/2021).

## 2021-08-27 NOTE — Progress Notes (Signed)
SITUATION Reason for Consult: Evaluation for Prefabricated Diabetic Shoes and Bilateral Custom Diabetic Inserts. Patient / Caregiver Report: Patient would like well fitting shoes  OBJECTIVE DATA: Patient History / Diagnosis:    ICD-10-CM   1. DM type 2 with diabetic peripheral neuropathy ([place])  E11.42     2. Acquired absence of left foot ([place])  [id number]       Current or Previous Devices:   Historical diabetic shoe user  In-Person Foot Examination: Ulcers & [place]ousing:   Historical  Toe / Foot Deformities:   - Hammertoes - Hallux valgus   Shoe Size: 13-4E  ORTHOTIC RECOMMENDATION Recommended Devices: - 1x pair prefabricated PDAC approved diabetic shoes: Patient selects [employer] Balance 813 Black Velcro 13-4E - 3x pair custom-to-patient vacuum formed diabetic insoles.   GOALS OF SHOES AND INSOLES - Reduce shear and pressure - Reduce / Prevent callus formation - Reduce / Prevent ulceration - Protect the fragile healing compromised diabetic foot.  Patient would benefit from diabetic shoes and inserts as patient has diabetes mellitus and the patient has one or more of the following conditions: - History of partial or complete amputation of the foot - History of previous foot ulceration. - History of pre-ulcerative callus - Peripheral neuropathy with evidence of callus formation - Foot deformity - Poor circulation  ACTIONS PERFORMED Patient was casted for insoles via crush box and measured for shoes via brannock device. Procedure was explained and patient tolerated procedure well. All questions were answered and concerns addressed.  PLAN Patient is to ensure treating physician receives and completes diabetic paperwork. Casts and shoe order are to be held until paperwork is received. Once received patient is to be scheduled for fitting in four weeks.

## 2021-08-30 DIAGNOSIS — Z7984 Long term (current) use of oral hypoglycemic drugs: Secondary | ICD-10-CM | POA: Diagnosis not present

## 2021-08-30 DIAGNOSIS — Z23 Encounter for immunization: Secondary | ICD-10-CM | POA: Diagnosis not present

## 2021-08-30 DIAGNOSIS — E1151 Type 2 diabetes mellitus with diabetic peripheral angiopathy without gangrene: Secondary | ICD-10-CM | POA: Diagnosis not present

## 2021-08-30 DIAGNOSIS — E1165 Type 2 diabetes mellitus with hyperglycemia: Secondary | ICD-10-CM | POA: Diagnosis not present

## 2021-08-30 DIAGNOSIS — I10 Essential (primary) hypertension: Secondary | ICD-10-CM | POA: Diagnosis not present

## 2021-08-30 DIAGNOSIS — M109 Gout, unspecified: Secondary | ICD-10-CM | POA: Diagnosis not present

## 2021-08-30 DIAGNOSIS — G4730 Sleep apnea, unspecified: Secondary | ICD-10-CM | POA: Diagnosis not present

## 2021-08-30 DIAGNOSIS — Z125 Encounter for screening for malignant neoplasm of prostate: Secondary | ICD-10-CM | POA: Diagnosis not present

## 2021-08-30 DIAGNOSIS — Z794 Long term (current) use of insulin: Secondary | ICD-10-CM | POA: Diagnosis not present

## 2021-08-30 DIAGNOSIS — Z0000 Encounter for general adult medical examination without abnormal findings: Secondary | ICD-10-CM | POA: Diagnosis not present

## 2021-08-30 DIAGNOSIS — E7800 Pure hypercholesterolemia, unspecified: Secondary | ICD-10-CM | POA: Diagnosis not present

## 2021-08-30 DIAGNOSIS — E113552 Type 2 diabetes mellitus with stable proliferative diabetic retinopathy, left eye: Secondary | ICD-10-CM | POA: Diagnosis not present

## 2021-09-04 DIAGNOSIS — H40053 Ocular hypertension, bilateral: Secondary | ICD-10-CM | POA: Diagnosis not present

## 2021-09-04 DIAGNOSIS — Z961 Presence of intraocular lens: Secondary | ICD-10-CM | POA: Diagnosis not present

## 2021-09-04 DIAGNOSIS — H3582 Retinal ischemia: Secondary | ICD-10-CM | POA: Diagnosis not present

## 2021-09-04 DIAGNOSIS — E113513 Type 2 diabetes mellitus with proliferative diabetic retinopathy with macular edema, bilateral: Secondary | ICD-10-CM | POA: Diagnosis not present

## 2021-09-09 ENCOUNTER — Other Ambulatory Visit (HOSPITAL_COMMUNITY): Payer: Self-pay

## 2021-09-09 ENCOUNTER — Encounter (HOSPITAL_COMMUNITY): Payer: Self-pay | Admitting: Emergency Medicine

## 2021-09-09 ENCOUNTER — Ambulatory Visit (HOSPITAL_COMMUNITY)
Admission: EM | Admit: 2021-09-09 | Discharge: 2021-09-09 | Disposition: A | Discharge status: Home / Self Care | Payer: Medicare HMO | Attending: Emergency Medicine | Admitting: Emergency Medicine

## 2021-09-09 ENCOUNTER — Other Ambulatory Visit: Payer: Self-pay

## 2021-09-09 DIAGNOSIS — T148XXA Other injury of unspecified body region, initial encounter: Secondary | ICD-10-CM | POA: Diagnosis not present

## 2021-09-09 DIAGNOSIS — L089 Local infection of the skin and subcutaneous tissue, unspecified: Secondary | ICD-10-CM | POA: Diagnosis not present

## 2021-09-09 DIAGNOSIS — T8789 Other complications of amputation stump: Secondary | ICD-10-CM | POA: Diagnosis not present

## 2021-09-09 DIAGNOSIS — L89892 Pressure ulcer of other site, stage 2: Secondary | ICD-10-CM

## 2021-09-09 MED ORDER — MEDIHONEY WOUND/BURN DRESSING EX PSTE
PASTE | CUTANEOUS | 0 refills | Status: DC
  Filled 2021-09-09: qty 103, fill #0

## 2021-09-09 MED ORDER — CLINDAMYCIN HCL 300 MG PO CAPS
300.0000 mg | ORAL_CAPSULE | Freq: Three times a day (TID) | ORAL | 0 refills | Status: AC
  Filled 2021-09-09: qty 21, 7d supply, fill #0

## 2021-09-09 MED ORDER — LEVOFLOXACIN 500 MG PO TABS
500.0000 mg | ORAL_TABLET | Freq: Every day | ORAL | 0 refills | Status: DC
  Filled 2021-09-09: qty 7, 7d supply, fill #0

## 2021-09-09 NOTE — ED Provider Notes (Signed)
[place]    CSN: [id number] Arrival date & time: 09/09/21  1101    HISTORY   Chief Complaint  Patient presents with   Wound Check   HPI Troy [name] is a 56 y.o. male. Patient has a history of a left BKA.  Currently using orthotic which is ill fitting.  At this time, patient is working with his orthotic provider to resolve this issue, prior to this he has developed significant callus formation at the end of his stump, was advised to use a pumice stone to remove the callus but unfortunately was little too aggressive and ruptured healthy tissue.  Patient states he is concerned that the area is now infected.  Patient denies malodorous drainage from the wound but states it is very tender, has been draining blood and pus and noticed today that he was having some chills.  Temp on arrival was 99.9.  Patient reports his last A1c was 7.3 which was last month.  Patient states this is slightly elevated from previous, states "I know what I did".  Patient states his new prosthetic is in process, should be ready in the next week or so.  Patient states he has been prescribed Medihoney in the past for previous wounds, has not applied it to this wound.  Per my observation, patient's wound has been expertly wrapped with sterile gauze and Cob[place], patient states that his wife wrapped it this morning.  The history is provided by the patient.  Past Medical History:  Diagnosis Date   Asthma    Bronchitis    CKD (chronic kidney disease)    Diabetes mellitus    Diabetic Charcot foot ([place])    Left   Gout    Hypertension    Kidney stones 11/30/2013   Neuromuscular disorder ([place])    Obesity    Pancreatitis    Polyneuropathy in diabetes [facility])    Sleep apnea    Patient Active Problem List   Diagnosis Date Noted   Personal history of colonic polyps 04/23/2021   Gout 09/25/2020   Morbid obesity ([place]) 09/25/2020   Painful amputation stump ([place]) 07/01/2019   Hyperlipidemia 09/07/2018    Diabetic foot ulcer ([place]) 09/07/2018   History of left below knee amputation ([place]) 03/26/2018   Dysphagia 09/01/2017   Special screening for malignant neoplasms, colon 09/01/2017   Esophageal reflux 09/01/2017   Acute osteomyelitis of left foot ([place])    Diabetic foot ([place]) 02/02/2016   Osteomyelitis ([place]) 02/02/2016   Osteomyelitis of foot, acute, left 02/02/2016   CKD (chronic kidney disease), stage II 02/02/2016   Anemia 02/02/2016   Kidney stones 11/30/2013   Cholelithiasis: PER CT ABD/PELVIS AND ABD [place] 11/30/13 11/30/2013   Acute renal failure ([place]) 11/29/2013   Acute pancreatitis 11/29/2013   Asthma exacerbation 11/09/2013   CAP (community acquired pneumonia) 07/18/2013   Diabetes ([place]) 07/18/2013   Hypertension    Past Surgical History:  Procedure Laterality Date   AMPUTATION Left 02/06/2016   Procedure: AMPUTATION BELOW KNEE;  Surgeon: Marybelle Killings, MD;  Location: [place];  Service: Orthopedics;  Laterality: Left;   CHOLECYSTECTOMY N/A 12/03/2013   Procedure: LAPAROSCOPIC CHOLECYSTECTOMY WITH INTRAOPERATIVE CHOLANGIOGRAM;  Surgeon: Rolm Bookbinder, MD;  Location: [place];  Service: General;  Laterality: N/A;   COLONOSCOPY WITH PROPOFOL N/A 09/01/2017   Procedure: COLONOSCOPY WITH PROPOFOL;  Surgeon: Wilford Corner, MD;  [facility];  Service: Endoscopy;  Laterality: N/A;   COLONOSCOPY WITH PROPOFOL N/A 04/23/2021   Procedure: COLONOSCOPY WITH  PROPOFOL;  Surgeon: Wilford Corner, MD;  [facility];  Service: Endoscopy;  Laterality: N/A;   ESOPHAGOGASTRODUODENOSCOPY (EGD) WITH PROPOFOL N/A 09/01/2017   Procedure: ESOPHAGOGASTRODUODENOSCOPY (EGD) WITH PROPOFOL;  Surgeon: Wilford Corner, MD;  [facility];  Service: Endoscopy;  Laterality: N/A;   NO PAST SURGERIES     POLYPECTOMY  04/23/2021   Procedure: POLYPECTOMY;  Surgeon: Wilford Corner, MD;  Location: [facility];  Service: Endoscopy;;    Home Medications    Prior to Admission medications    Medication Sig Start Date End Date Taking? Authorizing Provider  acetaminophen (TYLENOL) 500 MG tablet Take 1,000 mg by mouth every 8 (eight) hours as needed for moderate pain.    [provider]  albuterol (PROVENTIL HFA;VENTOLIN HFA) 108 (90 Base) MCG/ACT inhaler Inhale 1-2 puffs into the lungs every 6 (six) hours as needed for wheezing or shortness of breath. 08/25/15   Hyman Bible, PA-C  albuterol (PROVENTIL) (2.5 MG/3ML) 0.083% nebulizer solution USE 3 ML PER NEBULIZATION EVERY 4 HOURS AS NEEDED FOR WHEEZING 06/28/20 06/28/21  Katy Apo, NP  Alcohol Swabs (B-D SINGLE USE SWABS REGULAR) PADS  07/29/19   [provider]  allopurinol (ZYLOPRIM) 100 MG tablet  09/21/19   [provider]  aspirin 81 MG EC tablet Take 81 mg by mouth daily. Swallow whole.    [provider]  atorvastatin (LIPITOR) 10 MG tablet Take 10 mg by mouth daily. 03/06/17   [provider]  Blood Glucose Calibration (ACCU-CHEK AVIVA) SOLN  07/29/19   [provider]  Blood Glucose Monitoring Suppl (ACCU-CHEK AVIVA PLUS) w/Device KIT USE TO TEST YOUR BLOOD SUGAR THREE TIMES A DAY 01/12/19   [provider]  budesonide-formoterol (SYMBICORT) 160-4.5 MCG/ACT inhaler Inhale 2 puffs into the lungs 2 (two) times daily.    [provider]  Colchicine 0.6 MG CAPS Take 0.6 mg by mouth daily as needed (gout pain). 02/07/19   [provider]  CONTOUR NEXT TEST test strip 1 each daily as needed. 03/24/17   [provider]  cyclobenzaprine (FLEXERIL) 10 MG tablet Take 1 tablet (10 mg total) by mouth 3 (three) times daily as needed for muscle spasms. 05/27/21   Marybelle Killings, MD  dorzolamide-timolol (COSOPT) 22.3-6.8 MG/ML ophthalmic solution INSTILL 1 DROP INTO BOTH EYES TWICE A DAY 10/09/20 10/09/21  Warden Fillers, MD  doxycycline (VIBRAMYCIN) 100 MG capsule Take 1 capsule (100 mg total) by mouth 2 (two) times daily. 08/02/21   White, Leitha Schuller, NP   DROPLET PEN NEEDLES 31G X 6 MM MISC  07/29/19   [provider]  fluticasone (FLONASE) 50 MCG/ACT nasal spray USE ONE SPRAY IN EACH NOSTRIL ONCE DAILY 30 60 Patient taking differently: Place 1 spray into both nostrils daily. 06/29/20 06/29/21  Seward Carol, MD  folic acid (FOLVITE) 1 MG tablet TAKE 1 TABLET BY MOUTH ONCE A DAY 03/12/21     gabapentin (NEURONTIN) 300 MG capsule Take 300 mg by mouth at bedtime. 02/03/20   [provider]  glimepiride (AMARYL) 4 MG tablet Take 4 mg by mouth daily with breakfast.    [provider]  guaiFENesin-codeine (ROBITUSSIN AC) 100-10 MG/5ML syrup Take 5 mLs by mouth 3 (three) times daily as needed for cough. Patient not taking: No sig reported 03/15/21   Katy Apo, NP  Insulin Glargine [facility]) 100 UNIT/ML Inject 25 Units into the skin at bedtime.    [provider]  lisinopril-hydrochlorothiazide (PRINZIDE,ZESTORETIC) 10-12.5 MG tablet Take 1 tablet  by mouth daily.  05/06/16   [provider]  metFORMIN (GLUCOPHAGE) 1000 MG tablet Take 1,000 mg by mouth 2 (two) times daily. 03/16/17   [provider]  methotrexate (RHEUMATREX) 2.5 MG tablet TAKE 4 TABLETS AT ONCE BY MOUTH ONCE A WEEK 10/26/20 10/26/21  Valinda Party, MD  MICROLET LANCETS MISC 1 each daily as needed. 02/16/17   [provider]  Multiple Vitamin (MULTIVITAMIN WITH MINERALS) TABS tablet Take 1 tablet by mouth daily.    [provider]  pantoprazole (PROTONIX) 40 MG tablet Take 40 mg by mouth daily.  06/11/16   [provider]  pegloticase (KRYSTEXXA) 8 MG/ML injection Inject 8 mg into the vein every 14 (fourteen) days.    [provider]  polyethylene glycol-electrolytes (NULYTELY) 420 g solution Take 4,000 mLs by mouth as directed 04/09/21     promethazine-dextromethorphan (PROMETHAZINE-DM) 6.25-15 MG/5ML syrup Take 5 mLs by mouth 4 (four) times daily as needed for cough. 08/02/21   Hans Eden,  NP  sildenafil (VIAGRA) 100 MG tablet Take 100 mg by mouth as needed for erectile dysfunction. 01/04/19   [provider]  tetrahydrozoline-zinc (VISINE-AC) 0.05-0.25 % ophthalmic solution Place 2 drops into both eyes 3 (three) times daily as needed (dry eyes).    [provider]  traMADol (ULTRAM) 50 MG tablet Take 50 mg by mouth daily as needed for severe pain. 12/02/18   [provider]  traMADol (ULTRAM) 50 MG tablet Take 1 tablet (50 mg total) by mouth every 12 (twelve) hours as needed. 07/04/21       Family History Family History  Problem Relation Age of Onset   Diabetes Mellitus II Father    Diabetes Father    Hypertension Father    Diabetes Mother    Hypertension Mother    CAD Neg Hx    Social History Social History   Tobacco Use   Smoking status: Never   Smokeless tobacco: Never  Substance Use Topics   Alcohol use: Yes    Comment: occasionally   Drug use: No   Allergies   Patient has no known allergies.  Review of Systems Review of Systems Pertinent findings noted in history of present illness.   Physical Exam Triage Vital Signs ED Triage Vitals  Enc Vitals Group     BP 05/24/21 0827 (!) 147/82     Pulse Rate 05/24/21 0827 72     Resp 05/24/21 0827 18     Temp 05/24/21 0827 98.3 F (36.8 C)     Temp Source 05/24/21 0827 Oral     SpO2 05/24/21 0827 98 %     Weight --      Height --      Head Circumference --      Peak Flow --      Pain Score 05/24/21 0826 5     Pain Loc --      Pain Edu? --      Excl. in [place]? --   No data found.  Updated Vital Signs BP (!) 142/84 (BP Location: Right Arm)    Pulse 97    Temp 99.9 F (37.7 C) (Oral)    Resp 18    SpO2 99%   Physical Exam Vitals and nursing note reviewed.  Constitutional:      General: He is not in acute distress.    Appearance: Normal appearance. He is not ill-appearing.  HENT:     Head: Normocephalic and atraumatic.  Eyes:  General: Lids are normal.        Right eye:  No discharge.        Left eye: No discharge.     Extraocular Movements: Extraocular movements intact.     Conjunctiva/sclera: Conjunctivae normal.     Right eye: Right conjunctiva is not injected.     Left eye: Left conjunctiva is not injected.  Neck:     Trachea: Trachea and phonation normal.  Cardiovascular:     Rate and Rhythm: Normal rate and regular rhythm.     Pulses: Normal pulses.     Heart sounds: Normal heart sounds. No murmur heard.   No friction rub. No gallop.  Pulmonary:     Effort: Pulmonary effort is normal. No accessory muscle usage, prolonged expiration or respiratory distress.     Breath sounds: Normal breath sounds. No stridor, decreased air movement or transmitted upper airway sounds. No decreased breath sounds, wheezing, rhonchi or rales.  Chest:     Chest wall: No tenderness.  Musculoskeletal:        General: Normal range of motion.     Cervical back: Normal range of motion and neck supple. Normal range of motion.     Comments: Left BKA.  Stump with stage II possibly stage III pressure ulcer at tip of stump with warmth, tenderness to palpation and surrounding erythema  Lymphadenopathy:     Cervical: No cervical adenopathy.  Skin:    General: Skin is warm and dry.     Findings: No erythema or rash.  Neurological:     General: No focal deficit present.     Mental Status: He is alert and oriented to person, place, and time.  Psychiatric:        Mood and Affect: Mood normal.        Behavior: Behavior normal.    Visual Acuity Right Eye Distance:   Left Eye Distance:   Bilateral Distance:    Right Eye Near:   Left Eye Near:    Bilateral Near:     UC Couse / Diagnostics / Procedures:    EKG  Radiology No results found.  Procedures Procedures (including critical care time)  UC Diagnoses / Final Clinical Impressions(s)   I have reviewed the triage vital signs and the nursing notes.  Pertinent labs & imaging results that were available during my  care of the patient were reviewed by me and considered in my medical decision making (see chart for details).    Final diagnoses:  Infected wound  Pressure ulcer of BKA stump, stage 2 ([place])   Patient advised that wound at the tip of his stump of his left BKA is infected, recommend begin antibiotics at this time.  Patient provided with prescription for levofloxacin 500 mg daily and clindamycin 300 mg 3 times daily, 7-day prescription provided.  Patient strongly advised to follow-up with his primary care provider within the next 2 to 3 days for reevaluation of the wound to ensure progress.  Patient also provided with an oral prescription of Medihoney at his request.  ED Prescriptions     Medication Sig Dispense Auth. Provider   clindamycin (CLEOCIN) 300 MG capsule Take 1 capsule (300 mg total) by mouth 3 (three) times daily for 7 days. 21 capsule Lynden Oxford Scales, PA-C   levofloxacin (LEVAQUIN) 500 MG tablet Take 1 tablet (500 mg total) by mouth daily. 7 tablet Lynden Oxford Scales, PA-C   Wound Dressings (MEDIHONEY WOUND/BURN DRESSING) PSTE Apply to wound as directed 103  mL Lynden Oxford Scales, PA-C      PDMP not reviewed this encounter.  Pending results:  Labs Reviewed - No data to display  Medications Ordered in UC: Medications - No data to display  Disposition Upon Discharge:  Condition: stable for discharge home Home: take medications as prescribed; routine discharge instructions as discussed; follow up as advised.  Patient presented with an acute illness with associated systemic symptoms and significant discomfort requiring urgent management. In my opinion, this is a condition that a prudent lay person (someone who possesses an average knowledge of health and medicine) may potentially expect to result in complications if not addressed urgently such as respiratory distress, impairment of bodily function or dysfunction of bodily organs.   Routine symptom specific, illness  specific and/or disease specific instructions were discussed with the patient and/or caregiver at length.   As such, the patient has been evaluated and assessed, work-up was performed and treatment was provided in alignment with urgent care protocols and evidence based medicine.  Patient/parent/caregiver has been advised that the patient may require follow up for further testing and treatment if the symptoms continue in spite of treatment, as clinically indicated and appropriate.  If the patient was tested for COVID-19, Influenza and/or RSV, then the patient/parent/guardian was advised to isolate at home pending the results of his/her diagnostic coronavirus test and potentially longer if theyre positive. I have also advised pt that if his/her COVID-19 test returns positive, it's recommended to self-isolate for at least 10 days after symptoms first appeared AND until fever-free for 24 hours without fever reducer AND other symptoms have improved or resolved. Discussed self-isolation recommendations as well as instructions for household member/close contacts as per the [facility] and  [facility], and also gave patient the [place] packet with this information.  Patient/parent/caregiver has been advised to return to the [facility] or PCP in 3-5 days if no better; to PCP or the Emergency Department if new signs and symptoms develop, or if the current signs or symptoms continue to change or worsen for further workup, evaluation and treatment as clinically indicated and appropriate  The patient will follow up with their current PCP if and as advised. If the patient does not currently have a PCP we will assist them in obtaining one.   The patient may need specialty follow up if the symptoms continue, in spite of conservative treatment and management, for further workup, evaluation, consultation and treatment as clinically indicated and appropriate.   Patient/parent/caregiver verbalized understanding and agreement of plan as  discussed.  All questions were addressed during visit.  Please see discharge instructions below for further details of plan.  Discharge Instructions:   Discharge Instructions      For your wound infection, please begin 2 antibiotics.  The first is clindamycin, 1 capsule 3 times daily and the second is levofloxacin, 1 capsule daily.  These 2 antibiotics together cover a broad spectrum of bacterial organisms that commonly infected diabetic wounds.  I have also provided you with a renewal for your prescription for Medihoney.  Please apply Medihoney today and redress your wound.  I recommend changing your dressing every 2 days.  It is very important that you reach out to your primary care provider's office today to schedule appointment for repeat evaluation of your wound to be performed within the next 2 to 3 days.  It is important that someone lays eyes on the wound to make sure that it is healing and not getting worse.  If it is getting  worse, as you suspect, IV antibiotics would be indicated.  Thank you for visiting urgent care today.  I appreciate the opportunity to participate in your care.    This office note has been dictated using Museum/gallery curator.  Unfortunately, and despite my best efforts, this method of dictation can sometimes lead to occasional typographical or grammatical errors.  I apologize in advance if this occurs.     Lynden Oxford Scales, PA-C 09/09/21 1303

## 2021-09-09 NOTE — ED Triage Notes (Signed)
Pt reports had a callus on end of amputation ok left BKA that has been ongoing but opened up and painful. Reports his doctor not in office today. Reports had chills today.

## 2021-09-09 NOTE — Discharge Instructions (Addendum)
For your wound infection, please begin 2 antibiotics.  The first is clindamycin, 1 capsule 3 times daily and the second is levofloxacin, 1 capsule daily.  These 2 antibiotics together cover a broad spectrum of bacterial organisms that commonly infected diabetic wounds.  I have also provided you with a renewal for your prescription for Medihoney.  Please apply Medihoney today and redress your wound.  I recommend changing your dressing every 2 days.  It is very important that you reach out to your primary care provider's office today to schedule appointment for repeat evaluation of your wound to be performed within the next 2 to 3 days.  It is important that someone lays eyes on the wound to make sure that it is healing and not getting worse.  If it is getting worse, as you suspect, IV antibiotics would be indicated.  Thank you for visiting urgent care today.  I appreciate the opportunity to participate in your care.

## 2021-09-10 ENCOUNTER — Encounter: Payer: Self-pay | Admitting: Orthopedic Surgery

## 2021-09-10 ENCOUNTER — Ambulatory Visit: Payer: Medicare HMO | Admitting: Orthopedic Surgery

## 2021-09-10 ENCOUNTER — Ambulatory Visit (INDEPENDENT_AMBULATORY_CARE_PROVIDER_SITE_OTHER): Payer: Medicare HMO

## 2021-09-10 ENCOUNTER — Other Ambulatory Visit (HOSPITAL_COMMUNITY): Payer: Self-pay

## 2021-09-10 DIAGNOSIS — M79605 Pain in left leg: Secondary | ICD-10-CM

## 2021-09-10 DIAGNOSIS — M79609 Pain in unspecified limb: Secondary | ICD-10-CM

## 2021-09-10 DIAGNOSIS — T8789 Other complications of amputation stump: Secondary | ICD-10-CM

## 2021-09-10 DIAGNOSIS — Z89512 Acquired absence of left leg below knee: Secondary | ICD-10-CM

## 2021-09-10 DIAGNOSIS — E782 Mixed hyperlipidemia: Secondary | ICD-10-CM | POA: Diagnosis not present

## 2021-09-10 DIAGNOSIS — E1140 Type 2 diabetes mellitus with diabetic neuropathy, unspecified: Secondary | ICD-10-CM | POA: Diagnosis not present

## 2021-09-10 DIAGNOSIS — I10 Essential (primary) hypertension: Secondary | ICD-10-CM | POA: Diagnosis not present

## 2021-09-11 ENCOUNTER — Encounter: Payer: Self-pay | Admitting: Orthopedic Surgery

## 2021-09-11 NOTE — Progress Notes (Signed)
Office Visit Note   Patient: Troy [name]           Date of Birth: 1966/02/19           MRN: [id number] Visit Date: 09/10/2021              Requested by: Seward Carol, MD [address] [place],  [place] [address] PCP: Seward Carol, MD  Chief Complaint  Patient presents with   Left Leg - Follow-up    Hx for left BKA 01/2016      HPI: Patient is a 57 year old gentleman who is seen for evaluation for painful left below-knee amputation.  Dr. [name] performed a transtibial amputation July 2017.  Patient states he went to urgent care yesterday was advised he had an infection and was started on antibiotics.  He states that he does feel better after starting the antibiotics.  Assessment & Plan: Visit Diagnoses:  1. History of left below knee amputation ([place])   2. Painful amputation stump ([place])     Plan: Patient was given a liner liner to wear against the residual limb he will continue with the antibiotics.  Patient will follow-up with biotech to obtain a new socket he is currently subsiding into his socket.  Follow-Up Instructions: No follow-ups on file.   Ortho Exam  Patient is alert, oriented, no adenopathy, well-dressed, normal affect, normal respiratory effort. Examination patient does have callus and ulceration over the residual limb there is no cellulitis no drainage the ulcer does not probe to bone.  Patient states his temperature was 99 prior to starting antibiotics.  Patient's socket has been extensively modified to prevent subsidence.  Due to the loss of residual volume patient is an bearing in the residual limb.  The callus around the ulcer was pared no signs of any deep abscess or infection.  Patient is an existing left transtibial  amputee.  Patient's current comorbidities are not expected to impact the ability to function with the prescribed prosthesis. Patient verbally communicates a strong desire to use a prosthesis. Patient currently requires  mobility aids to ambulate without a prosthesis.  Expects not to use mobility aids with a new prosthesis.  Patient is a K3 level ambulator that spends a lot of time walking around on uneven terrain over obstacles, up and down stairs, and ambulates with a variable cadence.     Imaging: XR Tibia/Fibula Left  Result Date: 09/11/2021 2 view radiographs of the left leg shows calcification around the patella most likely secondary to the prosthetic fit.  The distal tibia and fibula show no destructive bony changes soft tissue shows calcification of the arteries down to the tibia.  No images are attached to the encounter.  Labs: Lab Results  Component Value Date   HGBA1C 6.5 (H) 02/02/2016   HGBA1C 7.0 (H) 11/09/2013   HGBA1C 11.0 (H) 07/19/2013   LABURIC 6.5 02/06/2016   REPTSTATUS 02/03/2016 FINAL 02/02/2016   CULT NO GROWTH 02/02/2016     Lab Results  Component Value Date   ALBUMIN 3.3 (L) 06/27/2018   ALBUMIN 3.1 (L) 06/02/2018   ALBUMIN 4.0 04/10/2017    No results found for: MG No results found for: VD25OH  No results found for: PREALBUMIN CBC EXTENDED Latest Ref Rng & Units 06/27/2018 06/02/2018 05/28/2018  WBC 4.0 - 10.5 K/uL 10.7(H) 12.7(H) 13.0(H)  RBC 4.22 - 5.81 MIL/uL 4.27 4.24 4.32  HGB 13.0 - 17.0 g/dL 11.9(L) 11.9(L) 12.6(L)  HCT 39.0 - 52.0 % 37.3(L)  37.2(L) 38.3(L)  PLT 150 - 400 K/uL 267 352 235  NEUTROABS 1.7 - 7.7 K/uL 5.5 8.6(H) -  LYMPHSABS 0.7 - 4.0 K/uL 4.0 2.7 -     There is no height or weight on file to calculate BMI.  Orders:  Orders Placed This Encounter  Procedures   XR Tibia/Fibula Left   No orders of the defined types were placed in this encounter.    Procedures: No procedures performed  Clinical Data: No additional findings.  ROS:  All other systems negative, except as noted in the HPI. Review of Systems  Objective: Vital Signs: There were no vitals taken for this visit.  Specialty Comments:  No specialty comments  available.  PMFS History: Patient Active Problem List   Diagnosis Date Noted   Personal history of colonic polyps 04/23/2021   Gout 09/25/2020   Morbid obesity ([place]) 09/25/2020   Painful amputation stump ([place]) 07/01/2019   Hyperlipidemia 09/07/2018   Diabetic foot ulcer ([place]) 09/07/2018   History of left below knee amputation ([place]) 03/26/2018   Dysphagia 09/01/2017   Special screening for malignant neoplasms, colon 09/01/2017   Esophageal reflux 09/01/2017   Acute osteomyelitis of left foot ([place])    Diabetic foot ([place]) 02/02/2016   Osteomyelitis ([place]) 02/02/2016   Osteomyelitis of foot, acute, left 02/02/2016   CKD (chronic kidney disease), stage II 02/02/2016   Anemia 02/02/2016   Kidney stones 11/30/2013   Cholelithiasis: PER CT ABD/PELVIS AND ABD [place] 11/30/13 11/30/2013   Acute renal failure ([place]) 11/29/2013   Acute pancreatitis 11/29/2013   Asthma exacerbation 11/09/2013   CAP (community acquired pneumonia) 07/18/2013   Diabetes ([place]) 07/18/2013   Hypertension    Past Medical History:  Diagnosis Date   Asthma    Bronchitis    CKD (chronic kidney disease)    Diabetes mellitus    Diabetic Charcot foot ([place])    Left   Gout    Hypertension    Kidney stones 11/30/2013   Neuromuscular disorder (HCC)    Obesity    Pancreatitis    Polyneuropathy in diabetes ([place])    Sleep apnea     Family History  Problem Relation Age of Onset   Diabetes Mellitus II Father    Diabetes Father    Hypertension Father    Diabetes Mother    Hypertension Mother    CAD Neg Hx     Past Surgical History:  Procedure Laterality Date   AMPUTATION Left 02/06/2016   Procedure: AMPUTATION BELOW KNEE;  Surgeon: Marybelle Killings, MD;  Location: [facility];  Service: Orthopedics;  Laterality: Left;   CHOLECYSTECTOMY N/A 12/03/2013   Procedure: LAPAROSCOPIC CHOLECYSTECTOMY WITH INTRAOPERATIVE CHOLANGIOGRAM;  Surgeon: Rolm Bookbinder, MD;  Location: [facility];  Service: General;  Laterality: N/A;   COLONOSCOPY  WITH PROPOFOL N/A 09/01/2017   Procedure: COLONOSCOPY WITH PROPOFOL;  Surgeon: Wilford Corner, MD;  [facility];  Service: Endoscopy;  Laterality: N/A;   COLONOSCOPY WITH PROPOFOL N/A 04/23/2021   Procedure: COLONOSCOPY WITH PROPOFOL;  Surgeon: Wilford Corner, MD;  [facility];  Service: Endoscopy;  Laterality: N/A;   ESOPHAGOGASTRODUODENOSCOPY (EGD) WITH PROPOFOL N/A 09/01/2017   Procedure: ESOPHAGOGASTRODUODENOSCOPY (EGD) WITH PROPOFOL;  Surgeon: Wilford Corner, MD;  [facility];  Service: Endoscopy;  Laterality: N/A;   NO PAST SURGERIES     POLYPECTOMY  04/23/2021   Procedure: POLYPECTOMY;  Surgeon: Wilford Corner, MD;  [facility];  Service: Endoscopy;;   Social History   Occupational History   Not on  file  Tobacco Use   Smoking status: Never   Smokeless tobacco: Never  Substance and Sexual Activity   Alcohol use: Yes    Comment: occasionally   Drug use: No   Sexual activity: Not on file

## 2021-09-13 ENCOUNTER — Telehealth: Payer: Self-pay

## 2021-09-13 NOTE — Telephone Encounter (Signed)
Casts Sent to [facility]

## 2021-09-24 DIAGNOSIS — M1990 Unspecified osteoarthritis, unspecified site: Secondary | ICD-10-CM | POA: Diagnosis not present

## 2021-09-24 DIAGNOSIS — R6889 Other general symptoms and signs: Secondary | ICD-10-CM | POA: Diagnosis not present

## 2021-09-24 DIAGNOSIS — M25569 Pain in unspecified knee: Secondary | ICD-10-CM | POA: Diagnosis not present

## 2021-09-24 DIAGNOSIS — M1A9XX1 Chronic gout, unspecified, with tophus (tophi): Secondary | ICD-10-CM | POA: Diagnosis not present

## 2021-09-24 DIAGNOSIS — E119 Type 2 diabetes mellitus without complications: Secondary | ICD-10-CM | POA: Diagnosis not present

## 2021-09-24 DIAGNOSIS — J45909 Unspecified asthma, uncomplicated: Secondary | ICD-10-CM | POA: Diagnosis not present

## 2021-09-24 DIAGNOSIS — Z79899 Other long term (current) drug therapy: Secondary | ICD-10-CM | POA: Diagnosis not present

## 2021-10-03 DIAGNOSIS — R6889 Other general symptoms and signs: Secondary | ICD-10-CM | POA: Diagnosis not present

## 2021-10-07 ENCOUNTER — Other Ambulatory Visit: Payer: Self-pay

## 2021-10-07 ENCOUNTER — Ambulatory Visit: Payer: Medicare HMO | Admitting: Orthopedic Surgery

## 2021-10-07 DIAGNOSIS — Z89512 Acquired absence of left leg below knee: Secondary | ICD-10-CM | POA: Diagnosis not present

## 2021-10-07 DIAGNOSIS — R6889 Other general symptoms and signs: Secondary | ICD-10-CM | POA: Diagnosis not present

## 2021-10-08 ENCOUNTER — Encounter: Payer: Self-pay | Admitting: Orthopedic Surgery

## 2021-10-08 ENCOUNTER — Ambulatory Visit: Payer: Medicare HMO | Admitting: Podiatry

## 2021-10-08 ENCOUNTER — Encounter: Payer: Self-pay | Admitting: Podiatry

## 2021-10-08 DIAGNOSIS — D2371 Other benign neoplasm of skin of right lower limb, including hip: Secondary | ICD-10-CM | POA: Diagnosis not present

## 2021-10-08 DIAGNOSIS — R6889 Other general symptoms and signs: Secondary | ICD-10-CM | POA: Diagnosis not present

## 2021-10-08 NOTE — Progress Notes (Signed)
? ?Office Visit Note ?  ?Patient: Troy [name]           ?Date of Birth: [birth year]           ?MRN: [id number] ?Visit Date: 10/07/2021 ?             ?Requested by: Seward Carol, MD ?[address] ?[address] ?[place],  [place] [address] ?PCP: Seward Carol, MD ? ?Chief Complaint  ?Patient presents with  ? Left Leg - Follow-up, Wound Check  ?  Hx left BKA  ? ? ? ? ?HPI: ?Patient is a 56 year old gentleman who is status post left transtibial amputation in 2017 about 6 years ago.  Patient presents with a ulcer over the residual limb.  Patient is currently wearing a liner liner and will follow-up with biotech for a new socket. ? ?Assessment & Plan: ?Visit Diagnoses:  ?1. History of left below knee amputation ([place])   ? ? ?Plan: Continue to wear the liner liner, obtain a new socket. ? ?Follow-Up Instructions: Return in about 4 weeks (around 11/04/2021).  ? ?Ortho Exam ? ?Patient is alert, oriented, no adenopathy, well-dressed, normal affect, normal respiratory effort. ?Examination patient has a healing wound of the residual limb secondary to an baring pressure from his socket.  The ulcer is 5 mm in diameter with healthy granulation tissue there is no cellulitis no drainage no odor. ? ?Imaging: ?No results found. ? ? ?Labs: ?Lab Results  ?Component Value Date  ? HGBA1C 6.5 (H) 02/02/2016  ? HGBA1C 7.0 (H) 11/09/2013  ? HGBA1C 11.0 (H) 07/19/2013  ? LABURIC 6.5 02/06/2016  ? REPTSTATUS 02/03/2016 FINAL 02/02/2016  ? CULT NO GROWTH 02/02/2016  ? ? ? ?Lab Results  ?Component Value Date  ? ALBUMIN 3.3 (L) 06/27/2018  ? ALBUMIN 3.1 (L) 06/02/2018  ? ALBUMIN 4.0 04/10/2017  ? ? ?No results found for: MG ?No results found for: VD25OH ? ?No results found for: PREALBUMIN ?CBC EXTENDED Latest Ref Rng & Units 06/27/2018 06/02/2018 05/28/2018  ?WBC 4.0 - 10.5 K/uL 10.7(H) 12.7(H) 13.0(H)  ?RBC 4.22 - 5.81 MIL/uL 4.27 4.24 4.32  ?HGB 13.0 - 17.0 g/dL 11.9(L) 11.9(L) 12.6(L)  ?HCT 39.0 - 52.0 % 37.3(L) 37.2(L) 38.3(L)  ?PLT 150 - 400  K/uL 267 352 235  ?NEUTROABS 1.7 - 7.7 K/uL 5.5 8.6(H) -  ?LYMPHSABS 0.7 - 4.0 K/uL 4.0 2.7 -  ? ? ? ?There is no height or weight on file to calculate BMI. ? ?Orders:  ?No orders of the defined types were placed in this encounter. ? ?No orders of the defined types were placed in this encounter. ? ? ? Procedures: ?No procedures performed ? ?Clinical Data: ?No additional findings. ? ?ROS: ? ?All other systems negative, except as noted in the HPI. ?Review of Systems ? ?Objective: ?Vital Signs: There were no vitals taken for this visit. ? ?Specialty Comments:  ?No specialty comments available. ? ?PMFS History: ?Patient Active Problem List  ? Diagnosis Date Noted  ? Personal history of colonic polyps 04/23/2021  ? Gout 09/25/2020  ? Morbid obesity ([place]) 09/25/2020  ? Painful amputation stump ([place]) 07/01/2019  ? Hyperlipidemia 09/07/2018  ? Diabetic foot ulcer ([place]) 09/07/2018  ? History of left below knee amputation ([place]) 03/26/2018  ? Dysphagia 09/01/2017  ? Special screening for malignant neoplasms, colon 09/01/2017  ? Esophageal reflux 09/01/2017  ? Acute osteomyelitis of left foot ([place])   ? Diabetic foot ([place]) 02/02/2016  ? Osteomyelitis ([place]) 02/02/2016  ? Osteomyelitis of foot, acute, left  02/02/2016  ? CKD (chronic kidney disease), stage II 02/02/2016  ? Anemia 02/02/2016  ? Kidney stones 11/30/2013  ? Cholelithiasis: PER CT ABD/PELVIS AND ABD [place] 11/30/13 11/30/2013  ? Acute renal failure ([place]) 11/29/2013  ? Acute pancreatitis 11/29/2013  ? Asthma exacerbation 11/09/2013  ? CAP (community acquired pneumonia) 07/18/2013  ? Diabetes ([place]) 07/18/2013  ? Hypertension   ? ?Past Medical History:  ?Diagnosis Date  ? Asthma   ? Bronchitis   ? CKD (chronic kidney disease)   ? Diabetes mellitus   ? Diabetic Charcot foot ([place])   ? Left  ? Gout   ? Hypertension   ? Kidney stones 11/30/2013  ? Neuromuscular disorder ([place])   ? Obesity   ? Pancreatitis   ? Polyneuropathy in diabetes [facility])   ? Sleep apnea   ?  ?Family History   ?Problem Relation Age of Onset  ? Diabetes Mellitus II Father   ? Diabetes Father   ? Hypertension Father   ? Diabetes Mother   ? Hypertension Mother   ? CAD Neg Hx   ?  ?Past Surgical History:  ?Procedure Laterality Date  ? AMPUTATION Left 02/06/2016  ? Procedure: AMPUTATION BELOW KNEE;  Surgeon: Marybelle Killings, MD;  Location: [facility];  Service: Orthopedics;  Laterality: Left;  ? CHOLECYSTECTOMY N/A 12/03/2013  ? Procedure: LAPAROSCOPIC CHOLECYSTECTOMY WITH INTRAOPERATIVE CHOLANGIOGRAM;  Surgeon: Rolm Bookbinder, MD;  Location: [facility];  Service: General;  Laterality: N/A;  ? COLONOSCOPY WITH PROPOFOL N/A 09/01/2017  ? Procedure: COLONOSCOPY WITH PROPOFOL;  Surgeon: Wilford Corner, MD;  [facility];  Service: Endoscopy;  Laterality: N/A;  ? COLONOSCOPY WITH PROPOFOL N/A 04/23/2021  ? Procedure: COLONOSCOPY WITH PROPOFOL;  Surgeon: Wilford Corner, MD;  [facility];  Service: Endoscopy;  Laterality: N/A;  ? ESOPHAGOGASTRODUODENOSCOPY (EGD) WITH PROPOFOL N/A 09/01/2017  ? Procedure: ESOPHAGOGASTRODUODENOSCOPY (EGD) WITH PROPOFOL;  Surgeon: Wilford Corner, MD;  [facility];  Service: Endoscopy;  Laterality: N/A;  ? NO PAST SURGERIES    ? POLYPECTOMY  04/23/2021  ? Procedure: POLYPECTOMY;  Surgeon: Wilford Corner, MD;  [facility];  Service: Endoscopy;;  ? ?Social History  ? ?Occupational History  ? Not on file  ?Tobacco Use  ? Smoking status: Never  ? Smokeless tobacco: Never  ?Substance and Sexual Activity  ? Alcohol use: Yes  ?  Comment: occasionally  ? Drug use: No  ? Sexual activity: Not on file  ? ? ? ? ? ?

## 2021-10-08 NOTE — Progress Notes (Signed)
He presents today he was up patient of Dr. Eleanora Neighbor he has been wearing a bunion shield over his medial bunion history of amputation of the left leg currently states that he has no broken down skin.  He has a old plantar hyperkeratotic lesion subsecond that he continues to have trimmed every so often. ? ?Objective: Pulses are strongly palpable to the right lower extremity amputation below knee left.  He has a reactive hyperkeratotic lesion plantar aspect of the second metatarsal head most debridement today does not demonstrate any type of ulceration or blood hematoma. ? ?Assessment: Nail dystrophy and reactive benign skin lesion.  History of diabetes treated with amputation and peripheral neuropathy. ? ?Plan: We are awaiting his diabetic shoes and I debrided the reactive hyperkeratotic lesion today follow-up with him in about 6 weeks he will call sooner with any complications. ?

## 2021-10-11 DIAGNOSIS — Z89512 Acquired absence of left leg below knee: Secondary | ICD-10-CM | POA: Diagnosis not present

## 2021-10-11 DIAGNOSIS — R6889 Other general symptoms and signs: Secondary | ICD-10-CM | POA: Diagnosis not present

## 2021-10-25 ENCOUNTER — Telehealth: Payer: Self-pay

## 2021-10-25 DIAGNOSIS — I10 Essential (primary) hypertension: Secondary | ICD-10-CM | POA: Diagnosis not present

## 2021-10-25 DIAGNOSIS — E782 Mixed hyperlipidemia: Secondary | ICD-10-CM | POA: Diagnosis not present

## 2021-10-25 DIAGNOSIS — R6889 Other general symptoms and signs: Secondary | ICD-10-CM | POA: Diagnosis not present

## 2021-10-25 NOTE — Telephone Encounter (Signed)
Shoes ordered -  ?[employer] Men - Model [address] Strap - [place] [phone] ?

## 2021-11-04 ENCOUNTER — Ambulatory Visit: Payer: Medicare HMO | Admitting: Orthopedic Surgery

## 2021-11-04 ENCOUNTER — Encounter: Payer: Self-pay | Admitting: Orthopedic Surgery

## 2021-11-04 DIAGNOSIS — Z89512 Acquired absence of left leg below knee: Secondary | ICD-10-CM | POA: Diagnosis not present

## 2021-11-04 DIAGNOSIS — R6889 Other general symptoms and signs: Secondary | ICD-10-CM | POA: Diagnosis not present

## 2021-11-04 NOTE — Progress Notes (Signed)
? ?Office Visit Note ?  ?Patient: Troy [name]           ?Date of Birth: 1966/05/22           ?MRN: [id number] ?Visit Date: 11/04/2021 ?             ?Requested by: Seward Carol, MD ?[address] ?[address] ?[place],  [place] [address] ?PCP: Seward Carol, MD ? ?Chief Complaint  ?Patient presents with  ? Left Leg - Wound Check, Follow-up  ? ? ? ? ?HPI: ?Patient is a 56 year old gentleman who presents in follow-up for an ulcer over the residual limb from subsiding in his socket.  Patient has a new socket new liner and a new liner liner. ? ?Assessment & Plan: ?Visit Diagnoses:  ?1. History of left below knee amputation ([place])   ? ? ?Plan: Callus was pared there are no open ulcers at this time continue with the current liner liner. ? ?Follow-Up Instructions: Return if symptoms worsen or fail to improve.  ? ?Ortho Exam ? ?Patient is alert, oriented, no adenopathy, well-dressed, normal affect, normal respiratory effort. ?Examination the residual limb he has 2 calluses.  After informed consent the calluses were pared there is no open ulcers no drainage no cellulitis no tenderness to palpation.  Patient's residual limb measures a size large liner liner. ? ?Imaging: ?No results found. ?No images are attached to the encounter. ? ?Labs: ?Lab Results  ?Component Value Date  ? HGBA1C 6.5 (H) 02/02/2016  ? HGBA1C 7.0 (H) 11/09/2013  ? HGBA1C 11.0 (H) 07/19/2013  ? LABURIC 6.5 02/06/2016  ? REPTSTATUS 02/03/2016 FINAL 02/02/2016  ? CULT NO GROWTH 02/02/2016  ? ? ? ?Lab Results  ?Component Value Date  ? ALBUMIN 3.3 (L) 06/27/2018  ? ALBUMIN 3.1 (L) 06/02/2018  ? ALBUMIN 4.0 04/10/2017  ? ? ?No results found for: MG ?No results found for: VD25OH ? ?No results found for: PREALBUMIN ? ?  Latest Ref Rng & Units 06/27/2018  ?  4:26 AM 06/02/2018  ? 11:31 AM 05/28/2018  ?  9:00 PM  ?CBC EXTENDED  ?WBC 4.0 - 10.5 K/uL 10.7   12.7   13.0    ?RBC 4.22 - 5.81 MIL/uL 4.27   4.24   4.32    ?Hemoglobin 13.0 - 17.0 g/dL 11.9   11.9   12.6     ?HCT 39.0 - 52.0 % 37.3   37.2   38.3    ?Platelets 150 - 400 K/uL 267   352   235    ?NEUT# 1.7 - 7.7 K/uL 5.5   8.6     ?Lymph# 0.7 - 4.0 K/uL 4.0   2.7     ? ? ? ?There is no height or weight on file to calculate BMI. ? ?Orders:  ?No orders of the defined types were placed in this encounter. ? ?No orders of the defined types were placed in this encounter. ? ? ? Procedures: ?No procedures performed ? ?Clinical Data: ?No additional findings. ? ?ROS: ? ?All other systems negative, except as noted in the HPI. ?Review of Systems ? ?Objective: ?Vital Signs: There were no vitals taken for this visit. ? ?Specialty Comments:  ?No specialty comments available. ? ?PMFS History: ?Patient Active Problem List  ? Diagnosis Date Noted  ? Personal history of colonic polyps 04/23/2021  ? Gout 09/25/2020  ? Morbid obesity ([place]) 09/25/2020  ? Painful amputation stump ([place]) 07/01/2019  ? Hyperlipidemia 09/07/2018  ? Diabetic foot ulcer ([place]) 09/07/2018  ?  History of left below knee amputation ([place]) 03/26/2018  ? Dysphagia 09/01/2017  ? Special screening for malignant neoplasms, colon 09/01/2017  ? Esophageal reflux 09/01/2017  ? Acute osteomyelitis of left foot ([place])   ? Diabetic foot ([place]) 02/02/2016  ? Osteomyelitis ([place]) 02/02/2016  ? Osteomyelitis of foot, acute, left 02/02/2016  ? CKD (chronic kidney disease), stage II 02/02/2016  ? Anemia 02/02/2016  ? Kidney stones 11/30/2013  ? Cholelithiasis: PER CT ABD/PELVIS AND ABD [place] 11/30/13 11/30/2013  ? Acute renal failure ([place]) 11/29/2013  ? Acute pancreatitis 11/29/2013  ? Asthma exacerbation 11/09/2013  ? CAP (community acquired pneumonia) 07/18/2013  ? Diabetes ([place]) 07/18/2013  ? Hypertension   ? ?Past Medical History:  ?Diagnosis Date  ? Asthma   ? Bronchitis   ? CKD (chronic kidney disease)   ? Diabetes mellitus   ? Diabetic Charcot foot ([place])   ? Left  ? Gout   ? Hypertension   ? Kidney stones 11/30/2013  ? Neuromuscular disorder ([place])   ? Obesity   ? Pancreatitis   ?  Polyneuropathy in diabetes [facility])   ? Sleep apnea   ?  ?Family History  ?Problem Relation Age of Onset  ? Diabetes Mellitus II Father   ? Diabetes Father   ? Hypertension Father   ? Diabetes Mother   ? Hypertension Mother   ? CAD Neg Hx   ?  ?Past Surgical History:  ?Procedure Laterality Date  ? AMPUTATION Left 02/06/2016  ? Procedure: AMPUTATION BELOW KNEE;  Surgeon: Marybelle Killings, MD;  Location: [facility];  Service: Orthopedics;  Laterality: Left;  ? CHOLECYSTECTOMY N/A 12/03/2013  ? Procedure: LAPAROSCOPIC CHOLECYSTECTOMY WITH INTRAOPERATIVE CHOLANGIOGRAM;  Surgeon: Rolm Bookbinder, MD;  Location: [facility];  Service: General;  Laterality: N/A;  ? COLONOSCOPY WITH PROPOFOL N/A 09/01/2017  ? Procedure: COLONOSCOPY WITH PROPOFOL;  Surgeon: Wilford Corner, MD;  [facility];  Service: Endoscopy;  Laterality: N/A;  ? COLONOSCOPY WITH PROPOFOL N/A 04/23/2021  ? Procedure: COLONOSCOPY WITH PROPOFOL;  Surgeon: Wilford Corner, MD;  [facility];  Service: Endoscopy;  Laterality: N/A;  ? ESOPHAGOGASTRODUODENOSCOPY (EGD) WITH PROPOFOL N/A 09/01/2017  ? Procedure: ESOPHAGOGASTRODUODENOSCOPY (EGD) WITH PROPOFOL;  Surgeon: Wilford Corner, MD;  [facility];  Service: Endoscopy;  Laterality: N/A;  ? NO PAST SURGERIES    ? POLYPECTOMY  04/23/2021  ? Procedure: POLYPECTOMY;  Surgeon: Wilford Corner, MD;  [facility];  Service: Endoscopy;;  ? ?Social History  ? ?Occupational History  ? Not on file  ?Tobacco Use  ? Smoking status: Never  ? Smokeless tobacco: Never  ?Substance and Sexual Activity  ? Alcohol use: Yes  ?  Comment: occasionally  ? Drug use: No  ? Sexual activity: Not on file  ? ? ? ? ? ?

## 2021-11-19 ENCOUNTER — Ambulatory Visit: Payer: Medicare HMO | Admitting: Podiatry

## 2021-11-19 DIAGNOSIS — L84 Corns and callosities: Secondary | ICD-10-CM | POA: Diagnosis not present

## 2021-11-19 DIAGNOSIS — E1142 Type 2 diabetes mellitus with diabetic polyneuropathy: Secondary | ICD-10-CM | POA: Diagnosis not present

## 2021-11-19 DIAGNOSIS — R6889 Other general symptoms and signs: Secondary | ICD-10-CM | POA: Diagnosis not present

## 2021-11-19 DIAGNOSIS — D2371 Other benign neoplasm of skin of right lower limb, including hip: Secondary | ICD-10-CM

## 2021-11-19 NOTE — Progress Notes (Signed)
Presents today for follow-up of his painful callus plantar aspect of the right foot would like to pick up his diabetic shoes. ? ?Objective: Vital signs are stable alert and oriented x3.  Pulses are palpable right.  Mild pitting edema 1 out of 4 right forefoot and ankle.  No calf pain.  No warmth in the calf. ? ?Reactive hyperkeratotic lesion beneath the first metatarsal head of the right foot demonstrates no erythema edema cellulitis drainage or odor no open lesions or wounds. ? ?Assessment: Preulcerative diabetic lesion plantar aspect forefoot right. ? ?Plan: Debrided this today want him to get into his diabetic shoes.  Seems that we have a hard time getting his diabetic shoes in.  It appears that we initially started working for the shoes January 31 and he was seen again on 09/13/2021 and then lastly 10/25/2021. ?

## 2021-11-20 DIAGNOSIS — I10 Essential (primary) hypertension: Secondary | ICD-10-CM | POA: Diagnosis not present

## 2021-11-20 DIAGNOSIS — E1151 Type 2 diabetes mellitus with diabetic peripheral angiopathy without gangrene: Secondary | ICD-10-CM | POA: Diagnosis not present

## 2021-11-20 DIAGNOSIS — E7800 Pure hypercholesterolemia, unspecified: Secondary | ICD-10-CM | POA: Diagnosis not present

## 2021-11-21 ENCOUNTER — Ambulatory Visit: Payer: Medicare HMO

## 2021-11-21 DIAGNOSIS — Z89432 Acquired absence of left foot: Secondary | ICD-10-CM

## 2021-11-21 DIAGNOSIS — E1142 Type 2 diabetes mellitus with diabetic polyneuropathy: Secondary | ICD-10-CM

## 2021-11-21 NOTE — Progress Notes (Signed)
SITUATION: ?Reason for Visit: Fitting and Delivery of Custom Fabricated Diabetic Foot Orthoses ?Patient Report: Shoes have not yet arrived. Dr. Milinda Pointer would like him to go ahead and have insoles ? ?OBJECTIVE DATA: ?Patient History / Diagnosis:   ?  ICD-10-CM   ?1. DM type 2 with diabetic peripheral neuropathy (HCC)  E11.42   ?  ?2. Acquired absence of left foot [facility])  H47.425   ?  ? ? ?Provided Device:  3x right Custom diabetic Foot Orthotics ?    [facility]: [id number] ? ?GOAL OF ORTHOSIS ?- Improve gait ?- Decrease energy expenditure ?- Improve Balance ?- Provide Triplanar stability of foot complex ?- Facilitate motion ? ?ACTIONS PERFORMED ?Patient was fit with foot orthotics trimmed to shoe last. Patient tolerated fittign procedure.  ? ?Patient was provided with verbal and written instruction and demonstration regarding donning, doffing, wear, care, proper fit, function, purpose, cleaning, and use of the orthosis and in all related precautions and risks and benefits regarding the orthosis. ? ?Patient was also provided with verbal instruction regarding how to report any failures or malfunctions of the orthosis and necessary follow up care. Patient was also instructed to contact our office regarding any change in status that may affect the function of the orthosis. ? ?Patient demonstrated independence with proper donning, doffing, and fit and verbalized understanding of all instructions. ? ?PLAN: ?Patient is to follow up in one week or as necessary (PRN). All questions were answered and concerns addressed. Plan of care was discussed with and agreed upon by the patient. ? ?

## 2021-12-10 DIAGNOSIS — I10 Essential (primary) hypertension: Secondary | ICD-10-CM | POA: Diagnosis not present

## 2021-12-10 DIAGNOSIS — E113552 Type 2 diabetes mellitus with stable proliferative diabetic retinopathy, left eye: Secondary | ICD-10-CM | POA: Diagnosis not present

## 2021-12-10 DIAGNOSIS — E782 Mixed hyperlipidemia: Secondary | ICD-10-CM | POA: Diagnosis not present

## 2021-12-26 DIAGNOSIS — Z79899 Other long term (current) drug therapy: Secondary | ICD-10-CM | POA: Diagnosis not present

## 2021-12-26 DIAGNOSIS — M79671 Pain in right foot: Secondary | ICD-10-CM | POA: Diagnosis not present

## 2021-12-26 DIAGNOSIS — J45909 Unspecified asthma, uncomplicated: Secondary | ICD-10-CM | POA: Diagnosis not present

## 2021-12-26 DIAGNOSIS — E119 Type 2 diabetes mellitus without complications: Secondary | ICD-10-CM | POA: Diagnosis not present

## 2021-12-26 DIAGNOSIS — M1990 Unspecified osteoarthritis, unspecified site: Secondary | ICD-10-CM | POA: Diagnosis not present

## 2021-12-26 DIAGNOSIS — M1A9XX1 Chronic gout, unspecified, with tophus (tophi): Secondary | ICD-10-CM | POA: Diagnosis not present

## 2021-12-31 ENCOUNTER — Encounter: Payer: Self-pay | Admitting: Podiatry

## 2021-12-31 ENCOUNTER — Ambulatory Visit: Payer: Medicare HMO | Admitting: Podiatry

## 2021-12-31 DIAGNOSIS — D2371 Other benign neoplasm of skin of right lower limb, including hip: Secondary | ICD-10-CM | POA: Diagnosis not present

## 2021-12-31 DIAGNOSIS — B351 Tinea unguium: Secondary | ICD-10-CM | POA: Diagnosis not present

## 2021-12-31 DIAGNOSIS — M79676 Pain in unspecified toe(s): Secondary | ICD-10-CM

## 2021-12-31 DIAGNOSIS — R6889 Other general symptoms and signs: Secondary | ICD-10-CM | POA: Diagnosis not present

## 2021-12-31 DIAGNOSIS — E1142 Type 2 diabetes mellitus with diabetic polyneuropathy: Secondary | ICD-10-CM | POA: Diagnosis not present

## 2022-01-01 NOTE — Progress Notes (Signed)
He presents today for follow-up of his right foot and reactive hyperkeratotic lesion.  States that he had to buy shoes since his diabetic shoes are not in yet and his new shoes rubbed on his bunion and made it sort of inflamed.  Objective: Vital signs are stable amputation of the left leg right leg and foot with palpable pulses hallux valgus with some irritation with the skin at the level of the medial condyle of the head of the first metatarsal the lesion is not open.  Benign reactive hyperkeratotic lesion to the plantar aspect of the forefoot is not open either.  Painful elongated toenails.  Assessment: Benign lesion forefoot right pain in limb secondary to onychomycosis.  Plan: Debrided toenails right foot only and debrided benign lesion right foot.  He is to follow-up with Aaron Edelman for his diabetic shoes which should begin soon

## 2022-01-06 ENCOUNTER — Ambulatory Visit: Payer: Medicare HMO

## 2022-01-06 DIAGNOSIS — Z89432 Acquired absence of left foot: Secondary | ICD-10-CM

## 2022-01-06 DIAGNOSIS — E1142 Type 2 diabetes mellitus with diabetic polyneuropathy: Secondary | ICD-10-CM

## 2022-01-06 NOTE — Progress Notes (Signed)
SITUATION Reason for Visit: Fitting of Diabetic Shoes Patient / Caregiver Report:  Patient is satisfied with fit and function of shoes and insoles.  OBJECTIVE DATA: Patient History / Diagnosis:     ICD-10-CM   1. DM type 2 with diabetic peripheral neuropathy (HCC)  E11.42     2. Acquired absence of left foot ([place])  Z89.432       Change in Status:   None  ACTIONS PERFORMED: In-Person Delivery, patient was fit with: - 1x pair A5500 PDAC approved prefabricated Diabetic Shoes: Apex double strap sneaker  Shoes and insoles were verified for structural integrity and safety. Patient wore shoes and insoles in office. Skin was inspected and free of areas of concern after wearing shoes and inserts. Shoes and inserts fit properly. Patient / Caregiver provided with ferbal instruction and demonstration regarding donning, doffing, wear, care, proper fit, function, purpose, cleaning, and use of shoes and insoles ' and in all related precautions and risks and benefits regarding shoes and insoles. Patient / Caregiver was instructed to wear properly fitting socks with shoes at all times. Patient was also provided with verbal instruction regarding how to report any failures or malfunctions of shoes or inserts, and necessary follow up care. Patient / Caregiver was also instructed to contact physician regarding change in status that may affect function of shoes and inserts.   Patient / Caregiver verbalized undersatnding of instruction provided. Patient / Caregiver demonstrated independence with proper donning and doffing of shoes and inserts.  PLAN Patient to follow with treating physician as recommended. Plan of care was discussed with and agreed upon by patient and/or caregiver. All questions were answered and concerns addressed.

## 2022-01-09 ENCOUNTER — Ambulatory Visit: Payer: Medicare HMO

## 2022-01-09 DIAGNOSIS — E1142 Type 2 diabetes mellitus with diabetic polyneuropathy: Secondary | ICD-10-CM

## 2022-01-09 DIAGNOSIS — Z89432 Acquired absence of left foot: Secondary | ICD-10-CM

## 2022-01-09 NOTE — Progress Notes (Signed)
SITUATION Reason for Consult: Follow-up with diabetic shoes Patient / Caregiver Report: Patient reports shoes feel too tight and would like to exchange them for an alternate pair  OBJECTIVE DATA History / Diagnosis:    ICD-10-CM   1. DM type 2 with diabetic peripheral neuropathy ([place])  E11.42     2. Acquired absence of left foot ([place])  [id number]       Change in Pathology: None  ACTIONS PERFORMED Patient's equipment was checked for structural stability and fit. Patient selected [employer] [place] Black Velcro in 13XW Apex shoes were processed for return.  PLAN Patient will be called when shoes ready. Plan of care discussed with and agreed upon by patient / caregiver.

## 2022-01-14 DIAGNOSIS — H3582 Retinal ischemia: Secondary | ICD-10-CM | POA: Diagnosis not present

## 2022-01-14 DIAGNOSIS — Z961 Presence of intraocular lens: Secondary | ICD-10-CM | POA: Diagnosis not present

## 2022-01-14 DIAGNOSIS — E113513 Type 2 diabetes mellitus with proliferative diabetic retinopathy with macular edema, bilateral: Secondary | ICD-10-CM | POA: Diagnosis not present

## 2022-01-14 DIAGNOSIS — H40053 Ocular hypertension, bilateral: Secondary | ICD-10-CM | POA: Diagnosis not present

## 2022-02-05 ENCOUNTER — Ambulatory Visit: Payer: Medicare HMO | Admitting: Family

## 2022-02-05 ENCOUNTER — Other Ambulatory Visit: Payer: Self-pay

## 2022-02-05 ENCOUNTER — Ambulatory Visit (INDEPENDENT_AMBULATORY_CARE_PROVIDER_SITE_OTHER): Payer: Medicare HMO

## 2022-02-05 ENCOUNTER — Encounter: Payer: Self-pay | Admitting: Podiatry

## 2022-02-05 ENCOUNTER — Encounter (HOSPITAL_BASED_OUTPATIENT_CLINIC_OR_DEPARTMENT_OTHER): Payer: Self-pay | Admitting: Emergency Medicine

## 2022-02-05 ENCOUNTER — Other Ambulatory Visit (HOSPITAL_COMMUNITY): Payer: Self-pay

## 2022-02-05 ENCOUNTER — Ambulatory Visit (INDEPENDENT_AMBULATORY_CARE_PROVIDER_SITE_OTHER): Payer: Medicare HMO | Admitting: Podiatry

## 2022-02-05 DIAGNOSIS — E1122 Type 2 diabetes mellitus with diabetic chronic kidney disease: Secondary | ICD-10-CM | POA: Insufficient documentation

## 2022-02-05 DIAGNOSIS — I129 Hypertensive chronic kidney disease with stage 1 through stage 4 chronic kidney disease, or unspecified chronic kidney disease: Secondary | ICD-10-CM | POA: Insufficient documentation

## 2022-02-05 DIAGNOSIS — M545 Low back pain, unspecified: Secondary | ICD-10-CM | POA: Diagnosis present

## 2022-02-05 DIAGNOSIS — J45909 Unspecified asthma, uncomplicated: Secondary | ICD-10-CM | POA: Insufficient documentation

## 2022-02-05 DIAGNOSIS — Z7951 Long term (current) use of inhaled steroids: Secondary | ICD-10-CM | POA: Diagnosis not present

## 2022-02-05 DIAGNOSIS — N189 Chronic kidney disease, unspecified: Secondary | ICD-10-CM | POA: Diagnosis not present

## 2022-02-05 DIAGNOSIS — Z794 Long term (current) use of insulin: Secondary | ICD-10-CM | POA: Diagnosis not present

## 2022-02-05 DIAGNOSIS — E1142 Type 2 diabetes mellitus with diabetic polyneuropathy: Secondary | ICD-10-CM

## 2022-02-05 DIAGNOSIS — M5416 Radiculopathy, lumbar region: Secondary | ICD-10-CM | POA: Insufficient documentation

## 2022-02-05 DIAGNOSIS — Z7984 Long term (current) use of oral hypoglycemic drugs: Secondary | ICD-10-CM | POA: Diagnosis not present

## 2022-02-05 DIAGNOSIS — Z79899 Other long term (current) drug therapy: Secondary | ICD-10-CM | POA: Diagnosis not present

## 2022-02-05 DIAGNOSIS — L97512 Non-pressure chronic ulcer of other part of right foot with fat layer exposed: Secondary | ICD-10-CM

## 2022-02-05 LAB — URINALYSIS, ROUTINE W REFLEX MICROSCOPIC
Bilirubin Urine: NEGATIVE
Glucose, UA: >1000 mg/dL — AB
Hgb urine dipstick: NEGATIVE
Ketones, ur: NEGATIVE mg/dL
Leukocytes,Ua: NEGATIVE
Nitrite: NEGATIVE
Protein, ur: NEGATIVE mg/dL
Specific Gravity, Urine: <1.005 — ABNORMAL LOW (ref 1.005–1.030)
pH: 5 (ref 5.0–8.0)

## 2022-02-05 MED ORDER — CEPHALEXIN 500 MG PO CAPS
500.0000 mg | ORAL_CAPSULE | Freq: Four times a day (QID) | ORAL | 0 refills | Status: DC
  Filled 2022-02-05: qty 20, 5d supply, fill #0

## 2022-02-05 NOTE — Progress Notes (Signed)
  Subjective:  Patient ID: Troy [name], male    DOB: Aug 28, 1965,   MRN: [id number]  Chief Complaint  Patient presents with   Callouses     Right foot callus near bunion and at the ball of foot    56 y.o. male presents for concern callus and bleeding and a cut on his right foot near his big toe. Relates his wife noticed drainage and blood around the area the other day and they were concerned. Has been having calluses trimmed by Dr. Milinda Pointer regularly. Denies any pain or calluses but does have a history of Left BKA from previous charcot event.  Denies any other pedal complaints. Denies n/v/f/c.   Past Medical History:  Diagnosis Date   Asthma    Bronchitis    CKD (chronic kidney disease)    Diabetes mellitus    Diabetic Charcot foot ([place])    Left   Gout    Hypertension    Kidney stones 11/30/2013   Neuromuscular disorder ([place])    Obesity    Pancreatitis    Polyneuropathy in diabetes ([place])    Sleep apnea     Objective:  Physical Exam: Vascular: DP/PT pulses 2/4 bilateral. CFT <3 seconds. Normal hair growth on digits. No edema.  Skin. No lacerations or abrasions bilateral feet. Blister and drainage noted in first interspace on right and right hallux sulcus. Upon debridement 2.5 cm x 0.5 cm x 0.4 cm wound with granular base noted. No surrounding erythema or edema. Drainage noted with debridement of blister.  Musculoskeletal: MMT 5/5 bilateral lower extremities in DF, PF, Inversion and Eversion. Deceased ROM in DF of ankle joint. Left BKA.  Neurological: Sensation intact to light touch.   Assessment:   1. DM type 2 with diabetic peripheral neuropathy ([place])   2. Callus      Plan:  Patient was evaluated and treated and all questions answered. Ulcer right hallux with fat layer exposed  -Debridement as below. -Dressed with betadine, DSD. -Off-loading with surgical shoe. -Keflex sent x 5 days.  -Discussed glucose control and proper protein-rich diet.  -Discussed if any  worsening redness, pain, fever or chills to call or may need to report to the emergency room. Patient expressed understanding.   Procedure: Excisional Debridement of Wound Rationale: Removal of non-viable soft tissue from the wound to promote healing.  Anesthesia: none Pre-Debridement Wound Measurements:Overylying blister Post-Debridement Wound Measurements: 2.5 cm x 0.5 cm x 0.4 cm  Type of Debridement: Sharp Excisional Tissue Removed: Non-viable soft tissue Depth of Debridement: subcutaneous tissue. Technique: Sharp excisional debridement to bleeding, viable wound base.  Dressing: Dry, sterile, compression dressing. Disposition: Patient tolerated procedure well. Patient to return in 1 week for follow-up. Already has appointment scheduled with Dr. Milinda Pointer for next Thursday.   No follow-ups on file.   Lorenda Peck, DPM

## 2022-02-05 NOTE — ED Triage Notes (Signed)
Patient reports lower back, radiating up flank and around to groin and left stump pain that started this evening. Patient reports history of same in the past.

## 2022-02-06 ENCOUNTER — Other Ambulatory Visit (HOSPITAL_COMMUNITY): Payer: Self-pay

## 2022-02-06 ENCOUNTER — Emergency Department (HOSPITAL_BASED_OUTPATIENT_CLINIC_OR_DEPARTMENT_OTHER)
Admission: EM | Admit: 2022-02-06 | Discharge: 2022-02-06 | Disposition: A | Discharge status: Home / Self Care | Payer: Medicare HMO | Attending: Emergency Medicine | Admitting: Emergency Medicine

## 2022-02-06 DIAGNOSIS — M5416 Radiculopathy, lumbar region: Secondary | ICD-10-CM

## 2022-02-06 LAB — CBG MONITORING, ED: Glucose-Capillary: 129 mg/dL — ABNORMAL HIGH (ref 70–99)

## 2022-02-06 MED ORDER — TRAMADOL HCL 50 MG PO TABS
50.0000 mg | ORAL_TABLET | Freq: Four times a day (QID) | ORAL | 1 refills | Status: AC | PRN
  Filled 2022-02-06: qty 30, 8d supply, fill #0

## 2022-02-06 MED ORDER — TRAMADOL HCL 50 MG PO TABS
50.0000 mg | ORAL_TABLET | Freq: Once | ORAL | Status: AC
  Administered 2022-02-06: 50 mg via ORAL
  Filled 2022-02-06: qty 1

## 2022-02-06 NOTE — ED Provider Notes (Signed)
[facility] Provider Note: Georgena Spurling, MD, FACEP  CSN: [id number] MRN: [id number] ARRIVAL: 02/05/22 at 2121 ROOM: DB002/DB002   CHIEF COMPLAINT  Back Pain   HISTORY OF PRESENT ILLNESS  02/06/22 2:03 AM Troy [name] is a 56 y.o. male diabetic who is status post left BKA.  He has a known history of degenerative disc disease in his back.  When he got up off the toilet about 4 PM yesterday evening he developed pain in his left lower back radiating around to his left lower quadrant/groin.  The pain was severe enough that he was barely able to get up off the commode and had to use his walker to get around the house.  His pain has subsequently improved significantly and he rates it now as a 4 out of 10.  It is worse with movement of his left hip.  He took some Tylenol for it.  He has had similar episodes in the past.   Past Medical History:  Diagnosis Date   Asthma    Bronchitis    CKD (chronic kidney disease)    Diabetes mellitus    Diabetic Charcot foot ([place])    Left   Gout    Hypertension    Kidney stones 11/30/2013   Neuromuscular disorder (HCC)    Obesity    Pancreatitis    Polyneuropathy in diabetes [facility])    Sleep apnea     Past Surgical History:  Procedure Laterality Date   AMPUTATION Left 02/06/2016   Procedure: AMPUTATION BELOW KNEE;  Surgeon: Marybelle Killings, MD;  Location: [facility];  Service: Orthopedics;  Laterality: Left;   CHOLECYSTECTOMY N/A 12/03/2013   Procedure: LAPAROSCOPIC CHOLECYSTECTOMY WITH INTRAOPERATIVE CHOLANGIOGRAM;  Surgeon: Rolm Bookbinder, MD;  Location: [facility];  Service: General;  Laterality: N/A;   COLONOSCOPY WITH PROPOFOL N/A 09/01/2017   Procedure: COLONOSCOPY WITH PROPOFOL;  Surgeon: Wilford Corner, MD;  [facility];  Service: Endoscopy;  Laterality: N/A;   COLONOSCOPY WITH PROPOFOL N/A 04/23/2021   Procedure: COLONOSCOPY WITH PROPOFOL;  Surgeon: Wilford Corner, MD;  [facility];  Service: Endoscopy;   Laterality: N/A;   ESOPHAGOGASTRODUODENOSCOPY (EGD) WITH PROPOFOL N/A 09/01/2017   Procedure: ESOPHAGOGASTRODUODENOSCOPY (EGD) WITH PROPOFOL;  Surgeon: Wilford Corner, MD;  [facility];  Service: Endoscopy;  Laterality: N/A;   NO PAST SURGERIES     POLYPECTOMY  04/23/2021   Procedure: POLYPECTOMY;  Surgeon: Wilford Corner, MD;  [facility];  Service: Endoscopy;;    Family History  Problem Relation Age of Onset   Diabetes Mellitus II Father    Diabetes Father    Hypertension Father    Diabetes Mother    Hypertension Mother    CAD Neg Hx     Social History   Tobacco Use   Smoking status: Never   Smokeless tobacco: Never  Substance Use Topics   Alcohol use: Yes    Comment: occasionally   Drug use: No    Prior to Admission medications   Medication Sig Start Date End Date Taking? Authorizing Provider  acetaminophen (TYLENOL) 500 MG tablet Take 1,000 mg by mouth every 8 (eight) hours as needed for moderate pain.    [provider]  albuterol (PROVENTIL HFA;VENTOLIN HFA) 108 (90 Base) MCG/ACT inhaler Inhale 1-2 puffs into the lungs every 6 (six) hours as needed for wheezing or shortness of breath. 08/25/15   Hyman Bible, PA-C  albuterol (PROVENTIL) (2.5 MG/3ML) 0.083% nebulizer solution USE 3 ML PER NEBULIZATION EVERY 4 HOURS AS  NEEDED FOR WHEEZING 06/28/20 06/28/21  Katy Apo, NP  Alcohol Swabs (B-D SINGLE USE SWABS REGULAR) PADS  07/29/19   [provider]  allopurinol (ZYLOPRIM) 300 MG tablet Take 300 mg by mouth daily. 08/02/21   [provider]  atorvastatin (LIPITOR) 10 MG tablet Take 10 mg by mouth daily. 03/06/17   [provider]  Blood Glucose Monitoring Suppl (ACCU-CHEK AVIVA PLUS) w/Device KIT USE TO TEST YOUR BLOOD SUGAR THREE TIMES A DAY 01/12/19   [provider]  budesonide-formoterol (SYMBICORT) 160-4.5 MCG/ACT inhaler Inhale 2 puffs into the lungs 2 (two) times daily.    [provider]   cephALEXin (KEFLEX) 500 MG capsule Take 1 capsule (500 mg total) by mouth 4 (four) times daily for 5 days 02/05/22   Lorenda Peck, DPM  Colchicine 0.6 MG CAPS Take 0.6 mg by mouth daily as needed (gout pain). 02/07/19   [provider]  DROPLET PEN NEEDLES 31G X 6 MM [place]  07/29/19   [provider]  fluticasone (FLONASE) 50 MCG/ACT nasal spray USE ONE SPRAY IN EACH NOSTRIL ONCE DAILY 30 60 Patient taking differently: Place 1 spray into both nostrils daily. 06/29/20 06/29/21  Seward Carol, MD  gabapentin (NEURONTIN) 300 MG capsule Take 300 mg by mouth at bedtime. 02/03/20   [provider]  glimepiride (AMARYL) 4 MG tablet Take 4 mg by mouth daily with breakfast.    [provider]  Insulin Glargine (BASAGLAR KWIKPEN) 100 UNIT/ML Inject 25 Units into the skin at bedtime.    [provider]  lisinopril-hydrochlorothiazide (PRINZIDE,ZESTORETIC) 10-12.5 MG tablet Take 1 tablet by mouth daily.  05/06/16   [provider]  metFORMIN (GLUCOPHAGE) 1000 MG tablet Take 1,000 mg by mouth 2 (two) times daily. 03/16/17   [provider]  MICROLET LANCETS MISC 1 each daily as needed. 02/16/17   [provider]  Multiple Vitamin (MULTIVITAMIN WITH MINERALS) TABS tablet Take 1 tablet by mouth daily.    [provider]  pantoprazole (PROTONIX) 40 MG tablet Take 40 mg by mouth daily.  06/11/16   [provider]  sildenafil (VIAGRA) 100 MG tablet Take 100 mg by mouth as needed for erectile dysfunction. 01/04/19   [provider]  tetrahydrozoline-zinc (VISINE-AC) 0.05-0.25 % ophthalmic solution Place 2 drops into both eyes 3 (three) times daily as needed (dry eyes).    [provider]  traMADol (ULTRAM) 50 MG tablet Take 1 tablet (50 mg total) by mouth every 6 (six) hours as needed (for pain). 02/06/22   Wandra Babin, Jenny Reichmann, MD  Wound Dressings ([place] WOUND/BURN DRESSING) PSTE Apply to wound as directed 09/09/21   Lynden Oxford Scales, PA-C    Allergies Patient has no known allergies.   REVIEW OF SYSTEMS  Negative except as noted here or in the History of Present Illness.   PHYSICAL EXAMINATION  Initial Vital Signs Blood pressure (!) 141/79, pulse 81, temperature 98.1 F (36.7 C), resp. rate 18, height '5\' 10"'  (1.778 m), weight 131.5 kg, SpO2 100 %.  Examination General: Well-developed, well-nourished male in no acute distress; appearance consistent with age of record HENT: normocephalic; atraumatic Eyes: Normal appearing Neck: supple Heart: regular rate and rhythm Lungs: clear to auscultation bilaterally Abdomen: soft; nondistended; nontender; bowel sounds present Back: Nontender; mild pain on movement of left hip Extremities: Left BKA with prosthesis Neurologic: Awake, alert and oriented; motor function intact in all extremities and symmetric; no facial droop Skin: Warm and dry Psychiatric: Normal mood and affect  RESULTS  Summary of this visit's results, reviewed and interpreted by myself:   EKG Interpretation  Date/Time:    Ventricular Rate:    PR Interval:    QRS Duration:   QT Interval:    QTC Calculation:   R Axis:     Text Interpretation:         Laboratory Studies: Results for orders placed or performed during the hospital encounter of 02/06/22 (from the past 24 hour(s))  Urinalysis, Routine w reflex microscopic Urine, Clean Catch     Status: Abnormal   Collection Time: 02/05/22  9:37 PM  Result Value Ref Range   Color, Urine COLORLESS (A) YELLOW   APPearance CLEAR CLEAR   Specific Gravity, Urine <1.005 (L) 1.005 - 1.030   pH 5.0 5.0 - 8.0   Glucose, UA >1,000 (A) NEGATIVE mg/dL   Hgb urine dipstick NEGATIVE NEGATIVE   Bilirubin Urine NEGATIVE NEGATIVE   Ketones, ur NEGATIVE NEGATIVE mg/dL   Protein, ur NEGATIVE NEGATIVE mg/dL   Nitrite NEGATIVE NEGATIVE   Leukocytes,Ua NEGATIVE NEGATIVE  CBG monitoring, ED     Status: Abnormal   Collection Time: 02/06/22  12:08 AM  Result Value Ref Range   Glucose-Capillary 129 (H) 70 - 99 mg/dL   Imaging Studies: No results found.  ED COURSE and MDM  Nursing notes, initial and subsequent vitals signs, including pulse oximetry, reviewed and interpreted by myself.  Vitals:   02/05/22 2133 02/05/22 2135 02/06/22 0008  BP:  (!) 170/96 (!) 141/79  Pulse:  84 81  Resp:  18 18  Temp:  98.1 F (36.7 C)   SpO2:  100% 100%  Weight: 131.5 kg    Height: '5\' 10"'  (1.778 m)     Medications  traMADol (ULTRAM) tablet 50 mg (has no administration in time range)   The patient's symptoms are consistent with an exacerbation of lumbar radiculopathy as he had both pain and difficulty moving his lower back and left hip.  His pains have significantly improved and he is able to ambulate albeit with a mild limp.  We will refill his tramadol and refer back to his PCP.   PROCEDURES  Procedures   ED DIAGNOSES     ICD-10-CM   1. Lumbar radiculopathy  M54.16          Otho Michalik, MD 02/06/22 4827

## 2022-02-13 ENCOUNTER — Ambulatory Visit: Payer: Medicare HMO | Admitting: Podiatry

## 2022-02-13 ENCOUNTER — Encounter: Payer: Self-pay | Admitting: Podiatry

## 2022-02-13 DIAGNOSIS — J45909 Unspecified asthma, uncomplicated: Secondary | ICD-10-CM | POA: Diagnosis not present

## 2022-02-13 DIAGNOSIS — M21961 Unspecified acquired deformity of right lower leg: Secondary | ICD-10-CM | POA: Diagnosis not present

## 2022-02-13 DIAGNOSIS — L84 Corns and callosities: Secondary | ICD-10-CM | POA: Diagnosis not present

## 2022-02-13 DIAGNOSIS — D2371 Other benign neoplasm of skin of right lower limb, including hip: Secondary | ICD-10-CM

## 2022-02-13 DIAGNOSIS — I739 Peripheral vascular disease, unspecified: Secondary | ICD-10-CM

## 2022-02-13 DIAGNOSIS — E113552 Type 2 diabetes mellitus with stable proliferative diabetic retinopathy, left eye: Secondary | ICD-10-CM | POA: Diagnosis not present

## 2022-02-13 DIAGNOSIS — E1142 Type 2 diabetes mellitus with diabetic polyneuropathy: Secondary | ICD-10-CM

## 2022-02-13 DIAGNOSIS — L97512 Non-pressure chronic ulcer of other part of right foot with fat layer exposed: Secondary | ICD-10-CM

## 2022-02-13 DIAGNOSIS — M2011 Hallux valgus (acquired), right foot: Secondary | ICD-10-CM | POA: Diagnosis not present

## 2022-02-13 DIAGNOSIS — K219 Gastro-esophageal reflux disease without esophagitis: Secondary | ICD-10-CM | POA: Diagnosis not present

## 2022-02-13 DIAGNOSIS — I10 Essential (primary) hypertension: Secondary | ICD-10-CM | POA: Diagnosis not present

## 2022-02-13 DIAGNOSIS — E782 Mixed hyperlipidemia: Secondary | ICD-10-CM | POA: Diagnosis not present

## 2022-02-13 NOTE — Progress Notes (Signed)
He presents today for follow-up of his ulceration interdigital medial aspect of the first metatarsophalangeal joint and plantar aspect of the second metatarsal phalangeal joint on the right foot.  He presents today in a [place] shoe was last seen by Dr. Blenda Mounts and was treated with Iodosorb and Betadine.  He states that is looking much better he is very happy with the outcome thus far.  Objective: Vital signs are stable he is alert and oriented x3.  Pulses are minimally palpable to the right foot.  Ulcerations have gone on to dry up completely at this point appears that they are really nothing more than hyperkeratotic skin lesions.  Assessment: No open ulcerations at this point.  No signs of infection.  Plan: Placed padding around the first metatarsophalangeal joint medially discussed purchasing this with him and we asked him to see Cyril Mourning today to pick up his diabetic shoes.

## 2022-02-14 ENCOUNTER — Other Ambulatory Visit (HOSPITAL_COMMUNITY): Payer: Self-pay

## 2022-02-20 ENCOUNTER — Encounter: Payer: Self-pay | Admitting: Licensed Clinical Social Worker

## 2022-02-20 ENCOUNTER — Ambulatory Visit: Payer: Self-pay | Admitting: Licensed Clinical Social Worker

## 2022-02-20 NOTE — Patient Outreach (Signed)
  Care Coordination  Initial Visit Note   02/20/2022 Name: Troy [name] MRN: [id number] DOB: January 18, 1966  Troy [name] is a 56 y.o. year old male who sees Seward Carol, MD for primary care. I spoke with  [name] by phone today  What matters to the patients health and wellness today?   Back up transportation options Education on Advance Directives    Goals Addressed             This Visit's Progress    Back up transportation and Advance Directive       Care Coordination Interventions: Solution-Focused Strategies employed:  Provided EMMI education information on (look for e-mail from [employer] and  Choosing a Health Care Proxy Provided copy of Advance Directive Discussed transportation options: provided SCAT application        SDOH assessments and interventions completed:   Yes SDOH Interventions Today    Flowsheet Row Most Recent Value  SDOH Interventions   Housing Interventions Intervention Not Indicated  Stress Interventions Intervention Not Indicated  Transportation Interventions SCAT ([employer])  [application provided]      Care Coordination Interventions Activated:  Yes Care Coordination Interventions:  Yes, provided  Follow up plan: Follow up call scheduled for Aug. 28th at 9:30  Encounter Outcome:  Pt. Visit Completed  [name], [place] [phone]

## 2022-02-20 NOTE — Patient Instructions (Signed)
Visit Information  Thank you for taking time to visit with me today. Please don't hesitate to contact me if I can be of assistance to you.   Following are the goals we discussed today:   Goals Addressed             This Visit's Progress    Back up transportation and Advance Directive       Care Coordination Interventions: Solution-Focused Strategies employed:  Provided EMMI education information on (look for e-mail from [place] Directive and  Choosing a Health Care Proxy Provided copy of Advance Directive Discussed transportation options: provided SCAT application        Our next appointment is by telephone on Aug. 28th at 9:30  Please call the care guide team at [phone] if you need to cancel or reschedule your appointment.   If you are experiencing a Mental Health or [place] or need someone to talk to, please call 1-800-273-TALK (toll free, 24 hour hotline)   Patient verbalizes understanding of instructions and care plan provided today and agrees to view in [place]. Active MyChart status and patient understanding of how to access instructions and care plan via MyChart confirmed with patient.     [name], [place] [phone]

## 2022-02-27 DIAGNOSIS — E7800 Pure hypercholesterolemia, unspecified: Secondary | ICD-10-CM | POA: Diagnosis not present

## 2022-02-27 DIAGNOSIS — H9193 Unspecified hearing loss, bilateral: Secondary | ICD-10-CM | POA: Diagnosis not present

## 2022-02-27 DIAGNOSIS — I10 Essential (primary) hypertension: Secondary | ICD-10-CM | POA: Diagnosis not present

## 2022-02-27 DIAGNOSIS — G4730 Sleep apnea, unspecified: Secondary | ICD-10-CM | POA: Diagnosis not present

## 2022-02-27 DIAGNOSIS — Z23 Encounter for immunization: Secondary | ICD-10-CM | POA: Diagnosis not present

## 2022-02-27 DIAGNOSIS — Z794 Long term (current) use of insulin: Secondary | ICD-10-CM | POA: Diagnosis not present

## 2022-02-27 DIAGNOSIS — E113552 Type 2 diabetes mellitus with stable proliferative diabetic retinopathy, left eye: Secondary | ICD-10-CM | POA: Diagnosis not present

## 2022-02-27 DIAGNOSIS — M1460 Charcot's joint, unspecified site: Secondary | ICD-10-CM | POA: Diagnosis not present

## 2022-03-20 DIAGNOSIS — H9313 Tinnitus, bilateral: Secondary | ICD-10-CM | POA: Diagnosis not present

## 2022-03-20 DIAGNOSIS — Z570 Occupational exposure to noise: Secondary | ICD-10-CM | POA: Diagnosis not present

## 2022-03-20 DIAGNOSIS — H903 Sensorineural hearing loss, bilateral: Secondary | ICD-10-CM | POA: Diagnosis not present

## 2022-03-24 ENCOUNTER — Ambulatory Visit: Payer: Self-pay | Admitting: Licensed Clinical Social Worker

## 2022-03-24 NOTE — Patient Outreach (Signed)
  Care Coordination  Follow Up Visit Note   03/24/2022 Name: Troy [name] MRN: [id number] DOB: 1966-01-03  Troy [name] is a 56 y.o. year old male who sees Seward Carol, MD for primary care. I spoke with  [name] by phone today.  What matters to the patients health and wellness today?  Getting his hearing aide    Goals Addressed             This Visit's Progress    Advance Directive       Care Coordination Interventions: Solution-Focused Strategies employed: ref hearing aid Provided EMMI education information on (look for e-mail from [employer] and  Choosing a Health Care Proxy Mailed copy of Advance Directive Discussed transportation options: received info doesn't qualify for SCAT   A voluntary discussion about advanced care planning including importance of advanced directives, healthcare proxy and living will was discussed.        SDOH assessments and interventions completed:  No   Care Coordination Interventions Activated:  Yes  Care Coordination Interventions:  Yes, provided   Follow up plan: Follow up call scheduled for 30 days    Encounter Outcome:  Pt. Visit Completed   [name], [place] [phone]

## 2022-03-24 NOTE — Patient Instructions (Signed)
Visit Information  Thank you for taking time to visit with me today. Please don't hesitate to contact me if I can be of assistance to you.   Following are the goals we discussed today:   Goals Addressed             This Visit's Progress    Advance Directive       Care Coordination Interventions: Solution-Focused Strategies employed: ref hearing aid Provided EMMI education information on (look for e-mail from [employer] and  Choosing a Health Care Proxy Mailed copy of Advance Directive Discussed transportation options: received info doesn't qualify for SCAT   A voluntary discussion about advanced care planning including importance of advanced directives, healthcare proxy and living will was discussed.        Our next appointment is by telephone on Sept 28th at 9:30  Please call the care guide team at [phone] if you need to cancel or reschedule your appointment.   If you are experiencing a Mental Health or [place] or need someone to talk to, please call the Suicide and Crisis Lifeline: 988 call the [place] National Suicide Prevention Lifeline: (873) 063-0227 or TTY: 458-260-8567 TTY (936)112-8543) to talk to a trained counselor call 1-800-273-TALK (toll free, 24 hour hotline)   Patient verbalizes understanding of instructions and care plan provided today and agrees to view in [facility]. Active MyChart status and patient understanding of how to access instructions and care plan via MyChart confirmed with patient.      [name], [place] [phone]

## 2022-03-25 DIAGNOSIS — H3582 Retinal ischemia: Secondary | ICD-10-CM | POA: Diagnosis not present

## 2022-03-25 DIAGNOSIS — E113513 Type 2 diabetes mellitus with proliferative diabetic retinopathy with macular edema, bilateral: Secondary | ICD-10-CM | POA: Diagnosis not present

## 2022-03-25 DIAGNOSIS — H35372 Puckering of macula, left eye: Secondary | ICD-10-CM | POA: Diagnosis not present

## 2022-03-25 DIAGNOSIS — H31093 Other chorioretinal scars, bilateral: Secondary | ICD-10-CM | POA: Diagnosis not present

## 2022-03-27 ENCOUNTER — Ambulatory Visit: Payer: Medicare HMO | Admitting: Podiatry

## 2022-03-27 DIAGNOSIS — E1142 Type 2 diabetes mellitus with diabetic polyneuropathy: Secondary | ICD-10-CM

## 2022-03-27 DIAGNOSIS — D2372 Other benign neoplasm of skin of left lower limb, including hip: Secondary | ICD-10-CM

## 2022-03-27 DIAGNOSIS — D2371 Other benign neoplasm of skin of right lower limb, including hip: Secondary | ICD-10-CM | POA: Diagnosis not present

## 2022-03-27 NOTE — Progress Notes (Signed)
He presents today for follow-up of his callus plantar aspect of the right foot and for his toenails 1 through 5 right foot.  Objective: Vital signs stable he is alert oriented x3 pulses are palpable right foot.  Toenails are long thick yellow dystrophic onychomycotic right foot.  Painful lesion Sub first metatarsophalangeal joint right foot.  No open lesions or wounds are noted.  Assessment: Pain in limb secondary to diabetes mellitus with diabetic peripheral neuropathy hammertoe deformity painful onychomycosis and benign skin lesion.  Plan: Debrided nails 1 through 5 right and debrider benign skin lesion right.

## 2022-04-21 DIAGNOSIS — K219 Gastro-esophageal reflux disease without esophagitis: Secondary | ICD-10-CM | POA: Diagnosis not present

## 2022-04-21 DIAGNOSIS — N182 Chronic kidney disease, stage 2 (mild): Secondary | ICD-10-CM | POA: Diagnosis not present

## 2022-04-21 DIAGNOSIS — I10 Essential (primary) hypertension: Secondary | ICD-10-CM | POA: Diagnosis not present

## 2022-04-21 DIAGNOSIS — E7800 Pure hypercholesterolemia, unspecified: Secondary | ICD-10-CM | POA: Diagnosis not present

## 2022-04-21 DIAGNOSIS — E113552 Type 2 diabetes mellitus with stable proliferative diabetic retinopathy, left eye: Secondary | ICD-10-CM | POA: Diagnosis not present

## 2022-04-23 DIAGNOSIS — E119 Type 2 diabetes mellitus without complications: Secondary | ICD-10-CM | POA: Diagnosis not present

## 2022-04-23 DIAGNOSIS — M79671 Pain in right foot: Secondary | ICD-10-CM | POA: Diagnosis not present

## 2022-04-23 DIAGNOSIS — J45909 Unspecified asthma, uncomplicated: Secondary | ICD-10-CM | POA: Diagnosis not present

## 2022-04-23 DIAGNOSIS — M1A9XX1 Chronic gout, unspecified, with tophus (tophi): Secondary | ICD-10-CM | POA: Diagnosis not present

## 2022-04-23 DIAGNOSIS — Z79899 Other long term (current) drug therapy: Secondary | ICD-10-CM | POA: Diagnosis not present

## 2022-04-23 DIAGNOSIS — M1990 Unspecified osteoarthritis, unspecified site: Secondary | ICD-10-CM | POA: Diagnosis not present

## 2022-04-24 ENCOUNTER — Ambulatory Visit: Payer: Self-pay | Admitting: Licensed Clinical Social Worker

## 2022-04-24 NOTE — Patient Instructions (Signed)
Visit Information  Thank you for taking time to visit with me today. Please don't hesitate to contact me if I can be of assistance to you.   Following are the goals we discussed today:   Goals Addressed             This Visit's Progress    COMPLETED: Care Coordination Activities No Follow up Required       Care Coordination Interventions: Solution-Focused Strategies employed: ref hearing aid Assessed barriers with Advance Directive No other needs identified at this time       Please call the care guide team at [phone] if you need to cancel or reschedule your appointment.   Patient verbalizes understanding of instructions and care plan provided today and agrees to view in [place]. Active MyChart status and patient understanding of how to access instructions and care plan via MyChart confirmed with patient.     No further follow up required: by Care Coordination at this time  Care Coordination team works in collaboration with your primary care doctor.  Please call [phone] if you would like to schedule a phone appointment with a Nurse or Social work Care Coordinator to assist with navigating your physical and mental health needs.    [name], [place] [phone]

## 2022-04-24 NOTE — Patient Outreach (Signed)
  Care Coordination  Follow Up Visit Note   04/24/2022 Name: Troy [name] MRN: [id number] DOB: [birth year]  Troy [name] is a 56 y.o. year old male who sees Troy Carol, MD for primary care. I spoke with  Troy [name] by phone today.  What matters to the patients health and wellness today?    Patient is making progress with getting hearing aid, has 2nd hearing test schedule. Has not made progress with advance directive but has everything he needs .     Goals Addressed             This Visit's Progress    COMPLETED: Care Coordination Activities No Follow up Required       Care Coordination Interventions: Solution-Focused Strategies employed: ref hearing aid Assessed barriers with Advance Directive No other needs identified at this time       SDOH assessments and interventions completed:  Yes  SDOH Interventions Today    Flowsheet Row Most Recent Value  SDOH Interventions   Utilities Interventions Intervention Not Indicated        Care Coordination Interventions Activated:  Yes  Care Coordination Interventions:  Yes, provided   Follow up plan: No further intervention required.   Encounter Outcome:  Pt. Visit Completed   Troy [name], Troy [name] [phone]

## 2022-04-29 DIAGNOSIS — R6889 Other general symptoms and signs: Secondary | ICD-10-CM | POA: Diagnosis not present

## 2022-05-06 ENCOUNTER — Ambulatory Visit: Payer: Medicare HMO | Admitting: Podiatry

## 2022-05-06 ENCOUNTER — Encounter: Payer: Self-pay | Admitting: Podiatry

## 2022-05-06 DIAGNOSIS — E1142 Type 2 diabetes mellitus with diabetic polyneuropathy: Secondary | ICD-10-CM | POA: Diagnosis not present

## 2022-05-06 DIAGNOSIS — M79676 Pain in unspecified toe(s): Secondary | ICD-10-CM

## 2022-05-06 DIAGNOSIS — D2371 Other benign neoplasm of skin of right lower limb, including hip: Secondary | ICD-10-CM

## 2022-05-06 DIAGNOSIS — M79674 Pain in right toe(s): Secondary | ICD-10-CM | POA: Diagnosis not present

## 2022-05-06 DIAGNOSIS — B351 Tinea unguium: Secondary | ICD-10-CM | POA: Diagnosis not present

## 2022-05-06 NOTE — Progress Notes (Signed)
He presents today for follow-up of his diabetic peripheral neuropathy with vascular disease.  History of amputation left.  Complaining of painful callus plantar aspect of the right foot.  Objective: Vital signs stable oriented x3.  Pulses are palpable to the right foot.  Solitary callus to the plantar aspect of the first metatarsal head forefoot right.  Toenails are long and thickened.  Assessment: Diabetic peripheral neuropathy pain in limb secondary to benign skin lesion and painful elongated mycotic nails.  Plan: Debridement of toenails 1 through 5 of the right foot.  Debridement of benign skin lesion.

## 2022-05-14 DIAGNOSIS — H3582 Retinal ischemia: Secondary | ICD-10-CM | POA: Diagnosis not present

## 2022-05-14 DIAGNOSIS — E113513 Type 2 diabetes mellitus with proliferative diabetic retinopathy with macular edema, bilateral: Secondary | ICD-10-CM | POA: Diagnosis not present

## 2022-05-14 DIAGNOSIS — R6889 Other general symptoms and signs: Secondary | ICD-10-CM | POA: Diagnosis not present

## 2022-05-14 DIAGNOSIS — Z961 Presence of intraocular lens: Secondary | ICD-10-CM | POA: Diagnosis not present

## 2022-05-14 DIAGNOSIS — H40053 Ocular hypertension, bilateral: Secondary | ICD-10-CM | POA: Diagnosis not present

## 2022-05-20 DIAGNOSIS — R6889 Other general symptoms and signs: Secondary | ICD-10-CM | POA: Diagnosis not present

## 2022-06-05 DIAGNOSIS — R6889 Other general symptoms and signs: Secondary | ICD-10-CM | POA: Diagnosis not present

## 2022-06-14 ENCOUNTER — Ambulatory Visit (HOSPITAL_COMMUNITY)
Admission: EM | Admit: 2022-06-14 | Discharge: 2022-06-14 | Disposition: A | Discharge status: Home / Self Care | Payer: Medicare HMO | Attending: Physician Assistant | Admitting: Physician Assistant

## 2022-06-14 ENCOUNTER — Encounter (HOSPITAL_COMMUNITY): Payer: Self-pay

## 2022-06-14 DIAGNOSIS — R1010 Upper abdominal pain, unspecified: Secondary | ICD-10-CM | POA: Diagnosis not present

## 2022-06-14 DIAGNOSIS — R112 Nausea with vomiting, unspecified: Secondary | ICD-10-CM | POA: Diagnosis not present

## 2022-06-14 DIAGNOSIS — R197 Diarrhea, unspecified: Secondary | ICD-10-CM | POA: Diagnosis not present

## 2022-06-14 LAB — COMPREHENSIVE METABOLIC PANEL
ALT: 26 U/L (ref 0–44)
AST: 20 U/L (ref 15–41)
Albumin: 4 g/dL (ref 3.5–5.0)
Alkaline Phosphatase: 59 U/L (ref 38–126)
Anion gap: 11 (ref 5–15)
BUN: 24 mg/dL — ABNORMAL HIGH (ref 6–20)
CO2: 22 mmol/L (ref 22–32)
Calcium: 8.6 mg/dL — ABNORMAL LOW (ref 8.9–10.3)
Chloride: 106 mmol/L (ref 98–111)
Creatinine, Ser: 1.26 mg/dL — ABNORMAL HIGH (ref 0.61–1.24)
GFR, Estimated: >60 mL/min (ref >60)
Glucose, Bld: 190 mg/dL — ABNORMAL HIGH (ref 70–99)
Potassium: 4.2 mmol/L (ref 3.5–5.1)
Sodium: 139 mmol/L (ref 135–145)
Total Bilirubin: 0.5 mg/dL (ref 0.3–1.2)
Total Protein: 6.9 g/dL (ref 6.5–8.1)

## 2022-06-14 LAB — CBC WITH DIFFERENTIAL/PLATELET
Abs Immature Granulocytes: 0.04 10*3/uL (ref 0.00–0.07)
Basophils Absolute: 0 10*3/uL (ref 0.0–0.1)
Basophils Relative: 1 %
Eosinophils Absolute: 0.4 10*3/uL (ref 0.0–0.5)
Eosinophils Relative: 5 %
HCT: 47.2 % (ref 39.0–52.0)
Hemoglobin: 15.7 g/dL (ref 13.0–17.0)
Immature Granulocytes: 1 %
Lymphocytes Relative: 41 %
Lymphs Abs: 3.1 10*3/uL (ref 0.7–4.0)
MCH: 29.2 pg (ref 26.0–34.0)
MCHC: 33.3 g/dL (ref 30.0–36.0)
MCV: 87.7 fL (ref 80.0–100.0)
Monocytes Absolute: 0.5 10*3/uL (ref 0.1–1.0)
Monocytes Relative: 7 %
Neutro Abs: 3.5 10*3/uL (ref 1.7–7.7)
Neutrophils Relative %: 45 %
Platelets: 162 10*3/uL (ref 150–400)
RBC: 5.38 MIL/uL (ref 4.22–5.81)
RDW: 16.1 % — ABNORMAL HIGH (ref 11.5–15.5)
WBC: 7.5 10*3/uL (ref 4.0–10.5)
nRBC: 0 % (ref 0.0–0.2)

## 2022-06-14 LAB — POCT URINALYSIS DIPSTICK, ED / UC
Bilirubin Urine: NEGATIVE
Glucose, UA: >=1000 mg/dL — AB
Hgb urine dipstick: NEGATIVE
Ketones, ur: NEGATIVE mg/dL
Leukocytes,Ua: NEGATIVE
Nitrite: NEGATIVE
Protein, ur: NEGATIVE mg/dL
Specific Gravity, Urine: <=1.005 (ref 1.005–1.030)
Urobilinogen, UA: 0.2 mg/dL (ref 0.0–1.0)
pH: 5.5 (ref 5.0–8.0)

## 2022-06-14 LAB — LIPASE, BLOOD: Lipase: 347 U/L — ABNORMAL HIGH (ref 11–51)

## 2022-06-14 MED ORDER — ALUM & MAG HYDROXIDE-SIMETH 200-200-20 MG/5ML PO SUSP
ORAL | Status: AC
  Filled 2022-06-14: qty 30

## 2022-06-14 MED ORDER — LIDOCAINE VISCOUS HCL 2 % MT SOLN
15.0000 mL | Freq: Once | OROMUCOSAL | Status: AC
  Administered 2022-06-14: 15 mL via OROMUCOSAL

## 2022-06-14 MED ORDER — SUCRALFATE 1 GM/10ML PO SUSP
1.0000 g | Freq: Three times a day (TID) | ORAL | 0 refills | Status: DC
  Filled 2022-06-14: qty 420, 11d supply, fill #0

## 2022-06-14 MED ORDER — LIDOCAINE VISCOUS HCL 2 % MT SOLN
OROMUCOSAL | Status: AC
  Filled 2022-06-14: qty 15

## 2022-06-14 MED ORDER — ALUM & MAG HYDROXIDE-SIMETH 200-200-20 MG/5ML PO SUSP
30.0000 mL | Freq: Once | ORAL | Status: AC
  Administered 2022-06-14: 30 mL via ORAL

## 2022-06-14 NOTE — Discharge Instructions (Addendum)
Your urine showed high glucose because your blood sugar is not well controlled but is otherwise normal.  I will contact you with your lab work tomorrow if anything is abnormal.  Use Carafate 4 times a day to coat your stomach.  Continue your pantoprazole as prescribed.  Eat a bland diet and drink plenty of fluid.  Avoid spicy/acidic/fatty foods.  Call and schedule appointment with GI first thing Monday.  If you have any worsening symptoms including increased abdominal pain, fever, nausea, vomiting, blood in your stool, difficulty passing stool you need to go to the emergency room immediately.

## 2022-06-14 NOTE — ED Provider Notes (Signed)
[place]    CSN: [id number] Arrival date & time: 06/14/22  1654      History   Chief Complaint Chief Complaint  Patient presents with   Abdominal Pain    HPI Troy [name] is a 56 y.o. male.   Patient presents today with a 3-day history of upper abdominal pain.  Reports that this began involving upper abdominal pain and back and is now only involving his upper abdomen.  He reports pain is rated 3 on a 0-10 pain scale, localized to upper abdomen with radiation to left upper abdomen, described as aching, worse if he has not been eating or if he lays down at night.  He had diarrhea yesterday but this is resolved and he had normal bowel movement today.  He was experiencing some nausea and vomiting but denies any hematemesis, melena, hematochezia.  He has had pancreatitis several years ago but states current symptoms are less severe than episodes of this condition.  He does have a history of kidney stones but states he is not experiencing any of those similar symptoms.  Reports history of gallstones leading to cholecystectomy but denies additional abdominal surgery.  Denies any antibiotic use, medication changes, recent travel, suspicious food intake.  He does not take NSAIDs regularly.  Has not had any alcohol in the past few weeks.    Past Medical History:  Diagnosis Date   Asthma    Bronchitis    CKD (chronic kidney disease)    Diabetes mellitus    Diabetic Charcot foot ([place])    Left   Gout    Hypertension    Kidney stones 11/30/2013   Neuromuscular disorder ([place])    Obesity    Pancreatitis    Polyneuropathy in diabetes [facility])    Sleep apnea     Patient Active Problem List   Diagnosis Date Noted   Personal history of colonic polyps 04/23/2021   Gout 09/25/2020   Morbid obesity ([place]) 09/25/2020   Painful amputation stump ([place]) 07/01/2019   Hyperlipidemia 09/07/2018   Diabetic foot ulcer ([place]) 09/07/2018   History of left below knee amputation ([place])  03/26/2018   Dysphagia 09/01/2017   Special screening for malignant neoplasms, colon 09/01/2017   Esophageal reflux 09/01/2017   Acute osteomyelitis of left foot ([place])    Diabetic foot ([place]) 02/02/2016   Osteomyelitis ([place]) 02/02/2016   Osteomyelitis of foot, acute, left 02/02/2016   CKD (chronic kidney disease), stage II 02/02/2016   Anemia 02/02/2016   Kidney stones 11/30/2013   Cholelithiasis: PER CT ABD/PELVIS AND ABD [place] 11/30/13 11/30/2013   Acute renal failure ([place]) 11/29/2013   Acute pancreatitis 11/29/2013   Asthma exacerbation 11/09/2013   CAP (community acquired pneumonia) 07/18/2013   Diabetes ([place]) 07/18/2013   Hypertension     Past Surgical History:  Procedure Laterality Date   AMPUTATION Left 02/06/2016   Procedure: AMPUTATION BELOW KNEE;  Surgeon: Marybelle Killings, MD;  Location: [place];  Service: Orthopedics;  Laterality: Left;   CHOLECYSTECTOMY N/A 12/03/2013   Procedure: LAPAROSCOPIC CHOLECYSTECTOMY WITH INTRAOPERATIVE CHOLANGIOGRAM;  Surgeon: Rolm Bookbinder, MD;  Location: [place];  Service: General;  Laterality: N/A;   COLONOSCOPY WITH PROPOFOL N/A 09/01/2017   Procedure: COLONOSCOPY WITH PROPOFOL;  Surgeon: Wilford Corner, MD;  Location: [facility];  Service: Endoscopy;  Laterality: N/A;   COLONOSCOPY WITH PROPOFOL N/A 04/23/2021   Procedure: COLONOSCOPY WITH PROPOFOL;  Surgeon: Wilford Corner, MD;  Location: [facility];  Service: Endoscopy;  Laterality: N/A;   ESOPHAGOGASTRODUODENOSCOPY (  EGD) WITH PROPOFOL N/A 09/01/2017   Procedure: ESOPHAGOGASTRODUODENOSCOPY (EGD) WITH PROPOFOL;  Surgeon: Wilford Corner, MD;  Location: [facility];  Service: Endoscopy;  Laterality: N/A;   NO PAST SURGERIES     POLYPECTOMY  04/23/2021   Procedure: POLYPECTOMY;  Surgeon: Wilford Corner, MD;  Location: [facility];  Service: Endoscopy;;       Home Medications    Prior to Admission medications   Medication Sig Start Date End Date Taking? Authorizing Provider   sucralfate (CARAFATE) 1 GM/10ML suspension Take 10 mLs (1 g total) by mouth 4 (four) times daily -  with meals and at bedtime. 06/14/22  Yes Shereese Bonnie, Derry Skill, PA-C  acetaminophen (TYLENOL) 500 MG tablet Take 1,000 mg by mouth every 8 (eight) hours as needed for moderate pain.    [provider]  albuterol (PROVENTIL HFA;VENTOLIN HFA) 108 (90 Base) MCG/ACT inhaler Inhale 1-2 puffs into the lungs every 6 (six) hours as needed for wheezing or shortness of breath. 08/25/15   Hyman Bible, PA-C  albuterol (PROVENTIL) (2.5 MG/3ML) 0.083% nebulizer solution USE 3 ML PER NEBULIZATION EVERY 4 HOURS AS NEEDED FOR WHEEZING 06/28/20 06/28/21  Katy Apo, NP  Alcohol Swabs (B-D SINGLE USE SWABS REGULAR) PADS  07/29/19   [provider]  allopurinol (ZYLOPRIM) 300 MG tablet Take 300 mg by mouth daily. 03/09/22   [provider]  atorvastatin (LIPITOR) 10 MG tablet Take 10 mg by mouth daily. 03/06/17   [provider]  Blood Glucose Monitoring Suppl (ACCU-CHEK AVIVA PLUS) w/Device KIT USE TO TEST YOUR BLOOD SUGAR THREE TIMES A DAY 01/12/19   [provider]  budesonide-formoterol (SYMBICORT) 160-4.5 MCG/ACT inhaler Inhale 2 puffs into the lungs 2 (two) times daily.    [provider]  cephALEXin (KEFLEX) 500 MG capsule Take 1 capsule (500 mg total) by mouth 4 (four) times daily for 5 days 02/05/22   Lorenda Peck, DPM  Colchicine 0.6 MG CAPS Take 0.6 mg by mouth daily as needed (gout pain). 02/07/19   [provider]  dorzolamide-timolol (COSOPT) 22.3-6.8 MG/ML ophthalmic solution 1 drop 2 (two) times daily. 01/15/22   [provider]  DROPLET PEN NEEDLES 31G X 6 MM [place]  07/29/19   [provider]  fluticasone (FLONASE) 50 MCG/ACT nasal spray USE ONE SPRAY IN EACH NOSTRIL ONCE DAILY 30 60 Patient taking differently: Place 1 spray into both nostrils daily. 06/29/20 06/29/21  Seward Carol, MD  gabapentin (NEURONTIN) 300 MG capsule Take  300 mg by mouth at bedtime. 02/03/20   [provider]  glimepiride (AMARYL) 4 MG tablet Take 4 mg by mouth daily with breakfast.    [provider]  Insulin Glargine (BASAGLAR KWIKPEN) 100 UNIT/ML Inject 25 Units into the skin at bedtime.    [provider]  lisinopril (ZESTRIL) 20 MG tablet  01/14/22   [provider]  lisinopril-hydrochlorothiazide (PRINZIDE,ZESTORETIC) 10-12.5 MG tablet Take 1 tablet by mouth daily.  05/06/16   [provider]  metFORMIN (GLUCOPHAGE) 1000 MG tablet Take 1,000 mg by mouth 2 (two) times daily. 03/16/17   [provider]  MICROLET LANCETS MISC 1 each daily as needed. 02/16/17   [provider]  Multiple Vitamin (MULTIVITAMIN WITH MINERALS) TABS tablet Take 1 tablet by mouth daily.    [provider]  pantoprazole (PROTONIX) 40 MG tablet Take 40 mg by mouth daily.  06/11/16   [provider]  sildenafil (VIAGRA) 100 MG tablet Take 100 mg by mouth as needed for erectile  dysfunction. 01/04/19   [provider]  tetrahydrozoline-zinc (VISINE-AC) 0.05-0.25 % ophthalmic solution Place 2 drops into both eyes 3 (three) times daily as needed (dry eyes).    [provider]  traMADol (ULTRAM) 50 MG tablet Take 1 tablet (50 mg total) by mouth every 6 (six) hours as needed (for pain). 02/06/22   Molpus, John, MD  TRUE METRIX BLOOD GLUCOSE TEST test strip 1 each 3 (three) times daily. 01/30/22   [provider]  Wound Dressings ([place] WOUND/BURN DRESSING) PSTE Apply to wound as directed 09/09/21   Lynden Oxford Scales, PA-C    Family History Family History  Problem Relation Age of Onset   Diabetes Mellitus II Father    Diabetes Father    Hypertension Father    Diabetes Mother    Hypertension Mother    CAD Neg Hx     Social History Social History   Tobacco Use   Smoking status: Never   Smokeless tobacco: Never  Substance Use Topics   Alcohol use: Yes     Comment: occasionally   Drug use: No     Allergies   Patient has no known allergies.   Review of Systems Review of Systems  Constitutional:  Positive for activity change. Negative for appetite change, fatigue and fever.  Respiratory:  Negative for cough and shortness of breath.   Cardiovascular:  Negative for chest pain.  Gastrointestinal:  Positive for abdominal pain, diarrhea, nausea and vomiting. Negative for blood in stool and constipation.  Genitourinary:  Negative for flank pain.  Neurological:  Negative for dizziness, light-headedness and headaches.     Physical Exam Triage Vital Signs ED Triage Vitals [06/14/22 1816]  Enc Vitals Group     BP 127/85     Pulse Rate 73     Resp 12     Temp 98.2 F (36.8 C)     Temp Source Oral     SpO2 98 %     Weight      Height      Head Circumference      Peak Flow      Pain Score 3     Pain Loc      Pain Edu?      Excl. in [place]?    No data found.  Updated Vital Signs BP 127/85 (BP Location: Left Arm)   Pulse 73   Temp 98.2 F (36.8 C) (Oral)   Resp 12   SpO2 98%   Visual Acuity Right Eye Distance:   Left Eye Distance:   Bilateral Distance:    Right Eye Near:   Left Eye Near:    Bilateral Near:     Physical Exam Vitals reviewed.  Constitutional:      General: He is awake.     Appearance: Normal appearance. He is well-developed. He is not ill-appearing.     Comments: Very pleasant male appears stated age in no acute distress sitting comfortably in exam room  HENT:     Head: Normocephalic and atraumatic.     Mouth/Throat:     Pharynx: No oropharyngeal exudate, posterior oropharyngeal erythema or uvula swelling.  Cardiovascular:     Rate and Rhythm: Normal rate and regular rhythm.     Heart sounds: Normal heart sounds, S1 normal and S2 normal. No murmur heard. Pulmonary:     Effort: Pulmonary effort is normal.     Breath sounds: Normal breath sounds. No stridor. No wheezing, rhonchi or rales.     Comments:  Clear to auscultation bilaterally Abdominal:     General: Bowel sounds are normal.     Palpations: Abdomen is soft.     Tenderness: There is abdominal tenderness in the epigastric area and left upper quadrant. There is no right CVA tenderness, left CVA tenderness, guarding or rebound.  Neurological:     Mental Status: He is alert.  Psychiatric:        Behavior: Behavior is cooperative.      UC Treatments / Results  Labs (all labs ordered are listed, but only abnormal results are displayed) Labs Reviewed  POCT URINALYSIS DIPSTICK, ED / UC - Abnormal; Notable for the following components:      Result Value   Glucose, UA >=1000 (*)    All other components within normal limits  CBC WITH DIFFERENTIAL/PLATELET  COMPREHENSIVE METABOLIC PANEL  LIPASE, BLOOD    EKG   Radiology No results found.  Procedures Procedures (including critical care time)  Medications Ordered in UC Medications  lidocaine (XYLOCAINE) 2 % viscous mouth solution 15 mL (15 mLs Mouth/Throat Given 06/14/22 1843)  alum & mag hydroxide-simeth (MAALOX/MYLANTA) 200-200-20 MG/5ML suspension 30 mL (30 mLs Oral Given 06/14/22 1843)    Initial Impression / Assessment and Plan / UC Course  I have reviewed the triage vital signs and the nursing notes.  Pertinent labs & imaging results that were available during my care of the patient were reviewed by me and considered in my medical decision making (see chart for details).     Patient is well-appearing, afebrile, nontoxic, nontachycardic.  Vital signs and physical exam reassuring today; no indication for emergent evaluation or imaging.  GI cocktail provided with improvement of symptoms.  Suspect gastritis versus peptic ulcer disease as etiology of symptoms.  Patient was started on Carafate in addition to previously prescribed pantoprazole.  Recommended close follow-up with GI specialist and was given contact information for local provider with instruction to call to  schedule an appointment.  He is to eat a bland diet and avoid spicy/acidic/fatty foods.  Also recommended he avoid NSAIDs and alcohol.  Discussed we do not have imaging capabilities in urgent care and if he has any persistent/worsening symptoms including severe abdominal pain, nausea/vomiting, hematochezia, hematemesis, fever, melena he needs to go to the emergency room immediately.  Recommend close follow-up with his primary care first thing next week.  UA showed glucosuria but was otherwise normal with no evidence of infection or significant ketones.  CBC, CMP, lipase obtained today-results pending.  We will contact him if this is abnormal and we need to change her treatment plan.  We discussed at length alarm symptoms that would warrant going to the emergency room.  Strict return precautions given.  Work excuse note declined by patient.  Final Clinical Impressions(s) / UC Diagnoses   Final diagnoses:  Upper abdominal pain  Nausea vomiting and diarrhea     Discharge Instructions      Your urine showed high glucose because your blood sugar is not well controlled but is otherwise normal.  I will contact you with your lab work tomorrow if anything is abnormal.  Use Carafate 4 times a day to coat your stomach.  Continue your pantoprazole as prescribed.  Eat a bland diet and drink plenty of fluid.  Avoid spicy/acidic/fatty foods.  Call and schedule appointment with GI first thing Monday.  If you have any worsening symptoms including increased abdominal pain, fever, nausea, vomiting, blood in your stool, difficulty passing stool you need to go to  the emergency room immediately.     ED Prescriptions     Medication Sig Dispense Auth. Provider   sucralfate (CARAFATE) 1 GM/10ML suspension Take 10 mLs (1 g total) by mouth 4 (four) times daily -  with meals and at bedtime. 420 mL Stephano Arrants K, PA-C      PDMP not reviewed this encounter.   Terrilee Croak, PA-C 06/14/22 1858

## 2022-06-14 NOTE — ED Triage Notes (Signed)
Pt is here for stomach pain  x 3days , diarrhea  and vomiting . Pt stated he had one episode of vomiting and diarrhea diarrhea .

## 2022-06-15 ENCOUNTER — Emergency Department (HOSPITAL_COMMUNITY): Payer: Medicare HMO

## 2022-06-15 ENCOUNTER — Other Ambulatory Visit: Payer: Self-pay

## 2022-06-15 ENCOUNTER — Emergency Department (HOSPITAL_COMMUNITY)
Admission: EM | Admit: 2022-06-15 | Discharge: 2022-06-15 | Disposition: A | Discharge status: Home / Self Care | Payer: Medicare HMO | Attending: Emergency Medicine | Admitting: Emergency Medicine

## 2022-06-15 ENCOUNTER — Encounter (HOSPITAL_COMMUNITY): Payer: Self-pay

## 2022-06-15 DIAGNOSIS — Z794 Long term (current) use of insulin: Secondary | ICD-10-CM | POA: Diagnosis not present

## 2022-06-15 DIAGNOSIS — R748 Abnormal levels of other serum enzymes: Secondary | ICD-10-CM | POA: Diagnosis not present

## 2022-06-15 DIAGNOSIS — J984 Other disorders of lung: Secondary | ICD-10-CM | POA: Diagnosis not present

## 2022-06-15 DIAGNOSIS — R109 Unspecified abdominal pain: Secondary | ICD-10-CM | POA: Diagnosis not present

## 2022-06-15 DIAGNOSIS — R1013 Epigastric pain: Secondary | ICD-10-CM | POA: Insufficient documentation

## 2022-06-15 DIAGNOSIS — Z79899 Other long term (current) drug therapy: Secondary | ICD-10-CM | POA: Insufficient documentation

## 2022-06-15 DIAGNOSIS — E119 Type 2 diabetes mellitus without complications: Secondary | ICD-10-CM | POA: Insufficient documentation

## 2022-06-15 DIAGNOSIS — Z7984 Long term (current) use of oral hypoglycemic drugs: Secondary | ICD-10-CM | POA: Insufficient documentation

## 2022-06-15 DIAGNOSIS — R079 Chest pain, unspecified: Secondary | ICD-10-CM | POA: Diagnosis not present

## 2022-06-15 DIAGNOSIS — I10 Essential (primary) hypertension: Secondary | ICD-10-CM | POA: Insufficient documentation

## 2022-06-15 LAB — COMPREHENSIVE METABOLIC PANEL
ALT: 27 U/L (ref 0–44)
AST: 23 U/L (ref 15–41)
Albumin: 3.9 g/dL (ref 3.5–5.0)
Alkaline Phosphatase: 57 U/L (ref 38–126)
Anion gap: 10 (ref 5–15)
BUN: 21 mg/dL — ABNORMAL HIGH (ref 6–20)
CO2: 22 mmol/L (ref 22–32)
Calcium: 9 mg/dL (ref 8.9–10.3)
Chloride: 107 mmol/L (ref 98–111)
Creatinine, Ser: 1.19 mg/dL (ref 0.61–1.24)
GFR, Estimated: >60 mL/min (ref >60)
Glucose, Bld: 132 mg/dL — ABNORMAL HIGH (ref 70–99)
Potassium: 4.3 mmol/L (ref 3.5–5.1)
Sodium: 139 mmol/L (ref 135–145)
Total Bilirubin: 0.5 mg/dL (ref 0.3–1.2)
Total Protein: 7 g/dL (ref 6.5–8.1)

## 2022-06-15 LAB — CBC WITH DIFFERENTIAL/PLATELET
Abs Immature Granulocytes: 0.03 10*3/uL (ref 0.00–0.07)
Basophils Absolute: 0 10*3/uL (ref 0.0–0.1)
Basophils Relative: 1 %
Eosinophils Absolute: 0.3 10*3/uL (ref 0.0–0.5)
Eosinophils Relative: 4 %
HCT: 47.4 % (ref 39.0–52.0)
Hemoglobin: 16.3 g/dL (ref 13.0–17.0)
Immature Granulocytes: 0 %
Lymphocytes Relative: 43 %
Lymphs Abs: 3 10*3/uL (ref 0.7–4.0)
MCH: 29.9 pg (ref 26.0–34.0)
MCHC: 34.4 g/dL (ref 30.0–36.0)
MCV: 87 fL (ref 80.0–100.0)
Monocytes Absolute: 0.4 10*3/uL (ref 0.1–1.0)
Monocytes Relative: 6 %
Neutro Abs: 3.2 10*3/uL (ref 1.7–7.7)
Neutrophils Relative %: 46 %
Platelets: 157 10*3/uL (ref 150–400)
RBC: 5.45 MIL/uL (ref 4.22–5.81)
RDW: 15.9 % — ABNORMAL HIGH (ref 11.5–15.5)
WBC: 7 10*3/uL (ref 4.0–10.5)
nRBC: 0 % (ref 0.0–0.2)

## 2022-06-15 LAB — TROPONIN I (HIGH SENSITIVITY): Troponin I (High Sensitivity): 13 ng/L (ref <18)

## 2022-06-15 LAB — URINALYSIS, ROUTINE W REFLEX MICROSCOPIC
Bacteria, UA: NONE SEEN
Bilirubin Urine: NEGATIVE
Glucose, UA: >=500 mg/dL — AB
Hgb urine dipstick: NEGATIVE
Ketones, ur: NEGATIVE mg/dL
Leukocytes,Ua: NEGATIVE
Nitrite: NEGATIVE
Protein, ur: NEGATIVE mg/dL
Specific Gravity, Urine: 1.024 (ref 1.005–1.030)
pH: 7 (ref 5.0–8.0)

## 2022-06-15 LAB — LIPASE, BLOOD: Lipase: 176 U/L — ABNORMAL HIGH (ref 11–51)

## 2022-06-15 MED ORDER — IOHEXOL 350 MG/ML SOLN
50.0000 mL | Freq: Once | INTRAVENOUS | Status: AC | PRN
  Administered 2022-06-15: 50 mL via INTRAVENOUS

## 2022-06-15 MED ORDER — SUCRALFATE 1 GM/10ML PO SUSP: 1.0000 g | Freq: Three times a day (TID) | ORAL | 0 refills | Status: DC

## 2022-06-15 MED ORDER — IOHEXOL 350 MG/ML SOLN
75.0000 mL | Freq: Once | INTRAVENOUS | Status: AC | PRN
  Administered 2022-06-15: 75 mL via INTRAVENOUS

## 2022-06-15 NOTE — ED Provider Triage Note (Signed)
Emergency Medicine Provider Triage Evaluation Note  Troy [name] , a 56 y.o. male  was evaluated in triage.  Pt complains of abdominal pain with nausea and diarrhea over the last few days.  Symptoms started on Thursday, abdominal pains was at first diffuse.  Next day was localized to the epigastric region.  Also had some diarrhea that day.  Seen yesterday at [facility], labs drawn.  Was notified today that lipase was elevated and recommended to come to the ED for further evaluation.  Denies fevers or chills, or changes in urinary system.  Hx of cholecystectomy.  States this feels similar to his prior episodes of pancreatitis.  Review of Systems  Positive:  Negative: See above  Physical Exam  BP (!) 159/105 (BP Location: Left Arm)   Pulse 75   Temp 98.4 F (36.9 C) (Oral)   Resp 16   Ht '5\' 10"'$  (1.778 m)   Wt 129.3 kg   SpO2 100%   BMI 40.89 kg/m  Gen:   Awake, no distress   Resp:  Normal effort  MSK:   Moves extremities without difficulty  Other:  Epigastric tenderness, abdomen soft.  No flank tenderness.  Medical Decision Making  Medically screening exam initiated at 11:51 AM.  Appropriate orders placed.  Troy [name] was informed that the remainder of the evaluation will be completed by another provider, this initial triage assessment does not replace that evaluation, and the importance of remaining in the ED until their evaluation is complete.     Prince Rome, PA-C 37/62/83 1152

## 2022-06-15 NOTE — Discharge Instructions (Addendum)
You were seen in the ER today for evalution of your improving abdominal pain and your elevated lipase. Your lipase today is still elevated, but is significantly lowered. This could be early pancreatitis. I would try a bland diet. Please make sure you are checking your blood sugar throughout the day. If you have any concerns, new or worsening symptoms, please return to the nearest ER for re-evaluation.   Contact a doctor if: Your belly pain changes or gets worse. You are not hungry, or you lose weight without trying. You are having trouble pooping (constipated) or have watery poop (diarrhea) for more than 2-3 days. You have pain when you pee or poop. Your belly pain wakes you up at night. Your pain gets worse with meals, after eating, or with certain foods. You are vomiting and cannot keep anything down. You have a fever. You have blood in your pee. Get help right away if: Your pain does not go away as soon as your doctor says it should. You cannot stop vomiting. Your pain is only in areas of your belly, such as the right side or the left lower part of the belly. You have bloody or black poop, or poop that looks like tar. You have very bad pain, cramping, or bloating in your belly. You have signs of not having enough fluid or water in your body (dehydration), such as: Dark pee, very little pee, or no pee. Cracked lips. Dry mouth. Sunken eyes. Sleepiness. Weakness. You have trouble breathing or chest pain.

## 2022-06-15 NOTE — ED Provider Notes (Signed)
[facility] EMERGENCY DEPARTMENT Provider Note   CSN: [id number] Arrival date & time: 06/15/22  1027     History Chief Complaint  Patient presents with   Abnormal Lab    Troy [name] is a 56 y.o. male with history of diabetes and hypertension presents to the emergency department for evaluation of abnormal lab value.  Since Thursday, patient started to have some epigastric pain that became more diffuse.  He reports some nausea but no vomiting.  Reports it worsened little bit on Friday and decided he would go to the urgent care.  They prescribed him some Carafate and did some labs.  He reports that he woke up the next day and felt much better.  The abdominal pain had improved.  He is no longer nauseous.  His bowel movements are normal.  No fever.  He was called today to let them know that he had an elevated lipase level and may have pancreatitis again so he came into the emergency department today.  Currently, patient reports that his abdominal pain is improved.  He reports it still present but almost gone away.  He does report that it did start to radiate and over to his left rib cage that he denies any overt chest pain or shortness of breath.   Abnormal Lab      Home Medications Prior to Admission medications   Medication Sig Start Date End Date Taking? Authorizing Provider  acetaminophen (TYLENOL) 500 MG tablet Take 1,000 mg by mouth every 8 (eight) hours as needed for moderate pain.    [provider]  albuterol (PROVENTIL HFA;VENTOLIN HFA) 108 (90 Base) MCG/ACT inhaler Inhale 1-2 puffs into the lungs every 6 (six) hours as needed for wheezing or shortness of breath. 08/25/15   Hyman Bible, PA-C  albuterol (PROVENTIL) (2.5 MG/3ML) 0.083% nebulizer solution USE 3 ML PER NEBULIZATION EVERY 4 HOURS AS NEEDED FOR WHEEZING 06/28/20 06/28/21  Katy Apo, NP  Alcohol Swabs (B-D SINGLE USE SWABS REGULAR) PADS  07/29/19   [provider]   allopurinol (ZYLOPRIM) 300 MG tablet Take 300 mg by mouth daily. 03/09/22   [provider]  atorvastatin (LIPITOR) 10 MG tablet Take 10 mg by mouth daily. 03/06/17   [provider]  Blood Glucose Monitoring Suppl (ACCU-CHEK AVIVA PLUS) w/Device KIT USE TO TEST YOUR BLOOD SUGAR THREE TIMES A DAY 01/12/19   [provider]  budesonide-formoterol (SYMBICORT) 160-4.5 MCG/ACT inhaler Inhale 2 puffs into the lungs 2 (two) times daily.    [provider]  cephALEXin (KEFLEX) 500 MG capsule Take 1 capsule (500 mg total) by mouth 4 (four) times daily for 5 days 02/05/22   Lorenda Peck, DPM  Colchicine 0.6 MG CAPS Take 0.6 mg by mouth daily as needed (gout pain). 02/07/19   [provider]  dorzolamide-timolol (COSOPT) 22.3-6.8 MG/ML ophthalmic solution 1 drop 2 (two) times daily. 01/15/22   [provider]  DROPLET PEN NEEDLES 31G X 6 MM [place]  07/29/19   [provider]  fluticasone (FLONASE) 50 MCG/ACT nasal spray USE ONE SPRAY IN EACH NOSTRIL ONCE DAILY 30 60 Patient taking differently: Place 1 spray into both nostrils daily. 06/29/20 06/29/21  Seward Carol, MD  gabapentin (NEURONTIN) 300 MG capsule Take 300 mg by mouth at bedtime. 02/03/20   [provider]  glimepiride (AMARYL) 4 MG tablet Take 4 mg by mouth daily with breakfast.    [provider]  Insulin Glargine (BASAGLAR KWIKPEN) 100 UNIT/ML Inject  25 Units into the skin at bedtime.    [provider]  lisinopril (ZESTRIL) 20 MG tablet  01/14/22   [provider]  lisinopril-hydrochlorothiazide (PRINZIDE,ZESTORETIC) 10-12.5 MG tablet Take 1 tablet by mouth daily.  05/06/16   [provider]  metFORMIN (GLUCOPHAGE) 1000 MG tablet Take 1,000 mg by mouth 2 (two) times daily. 03/16/17   [provider]  MICROLET LANCETS MISC 1 each daily as needed. 02/16/17   [provider]  Multiple Vitamin (MULTIVITAMIN WITH MINERALS) TABS tablet  Take 1 tablet by mouth daily.    [provider]  pantoprazole (PROTONIX) 40 MG tablet Take 40 mg by mouth daily.  06/11/16   [provider]  sildenafil (VIAGRA) 100 MG tablet Take 100 mg by mouth as needed for erectile dysfunction. 01/04/19   [provider]  sucralfate (CARAFATE) 1 GM/10ML suspension Take 10 mLs (1 g total) by mouth 4 (four) times daily -  with meals and at bedtime. 06/15/22   Sherrell Puller, PA-C  tetrahydrozoline-zinc (VISINE-AC) 0.05-0.25 % ophthalmic solution Place 2 drops into both eyes 3 (three) times daily as needed (dry eyes).    [provider]  traMADol (ULTRAM) 50 MG tablet Take 1 tablet (50 mg total) by mouth every 6 (six) hours as needed (for pain). 02/06/22   Molpus, John, MD  TRUE METRIX BLOOD GLUCOSE TEST test strip 1 each 3 (three) times daily. 01/30/22   [provider]  Wound Dressings ([place] WOUND/BURN DRESSING) PSTE Apply to wound as directed 09/09/21   Lynden Oxford Scales, PA-C      Allergies    Patient has no known allergies.    Review of Systems   Review of Systems  Constitutional:  Negative for chills and fever.  Respiratory:  Negative for shortness of breath.   Cardiovascular:  Negative for chest pain.  Gastrointestinal:  Positive for abdominal pain and nausea. Negative for constipation, diarrhea and vomiting.  Genitourinary:  Negative for dysuria and hematuria.    Physical Exam Updated Vital Signs BP 134/87   Pulse 75   Temp 98.2 F (36.8 C) (Oral)   Resp 15   Ht _0  (1.778 m)   Wt 129.3 kg   SpO2 99%   BMI 40.89 kg/m  Physical Exam Vitals and nursing note reviewed.  Constitutional:      General: He is not in acute distress.    Appearance: Normal appearance. He is obese. He is not ill-appearing or toxic-appearing.  HENT:     Head: Normocephalic and atraumatic.  Eyes:     General: No scleral icterus. Cardiovascular:     Rate and Rhythm: Normal rate and regular rhythm.  Pulmonary:      Effort: Pulmonary effort is normal.     Breath sounds: Normal breath sounds.  Abdominal:     General: Bowel sounds are normal. There is no distension.     Palpations: Abdomen is soft.     Tenderness: There is abdominal tenderness. There is no guarding or rebound.     Comments: Mild epigastric tenderness to palpation. No guarding or rebound noted. NBS.   Musculoskeletal:        General: No deformity.     Cervical back: Normal range of motion.  Skin:    General: Skin is warm and dry.  Neurological:     General: No focal deficit present.     Mental Status: He is alert. Mental status is at baseline.     ED Results / Procedures /  Treatments   Labs (all labs ordered are listed, but only abnormal results are displayed) Labs Reviewed  CBC WITH DIFFERENTIAL/PLATELET - Abnormal; Notable for the following components:      Result Value   RDW 15.9 (*)    All other components within normal limits  COMPREHENSIVE METABOLIC PANEL - Abnormal; Notable for the following components:   Glucose, Bld 132 (*)    BUN 21 (*)    All other components within normal limits  LIPASE, BLOOD - Abnormal; Notable for the following components:   Lipase 176 (*)    All other components within normal limits  URINALYSIS, ROUTINE W REFLEX MICROSCOPIC - Abnormal; Notable for the following components:   Color, Urine STRAW (*)    Glucose, UA >=500 (*)    All other components within normal limits  TROPONIN I (HIGH SENSITIVITY)    EKG None  Radiology DG Chest 2 View  Result Date: 06/15/2022 CLINICAL DATA:  chest pain EXAM: CHEST - 2 VIEW COMPARISON:  May 28, 2018 FINDINGS: Question increased fullness of the LEFT paramediastinal border. Heart size is unchanged. No pleural effusion. No pneumothorax. No focal consolidation. Visualized abdomen is unremarkable. Multilevel degenerative changes of the thoracic spine. IMPRESSION: 1. Question increased fullness of the LEFT paramediastinal border. Recommend further  evaluation with dedicated CT chest with contrast to exclude underlying mass. 2. No focal consolidation. Electronically Signed   By: Valentino Saxon M.D.   On: 06/15/2022 16:40   CT Abdomen Pelvis W Contrast  Addendum Date: 06/15/2022   ADDENDUM REPORT: 06/15/2022 14:18 ADDENDUM: Correction voice recognition error : FINDINGS SECTION: FINDINGS SECTION Pancreas: No pancreatic duct dilatation. No retroperitoneal inflammation to suggest acute pancreatitis. Pancreas enhances uniformly. No edema. Common bile duct normal caliber. Electronically Signed   By: Suzy Bouchard M.D.   On: 06/15/2022 14:18   Result Date: 06/15/2022 CLINICAL DATA:  Abdominal pain. Acute nonlocalized. Nausea for 3 days. Elevated lipase. EXAM: CT ABDOMEN AND PELVIS WITH CONTRAST TECHNIQUE: Multidetector CT imaging of the abdomen and pelvis was performed using the standard protocol following bolus administration of intravenous contrast. RADIATION DOSE REDUCTION: This exam was performed according to the departmental dose-optimization program which includes automated exposure control, adjustment of the mA and/or kV according to patient size and/or use of iterative reconstruction technique. CONTRAST:  61m OMNIPAQUE IOHEXOL 350 MG/ML SOLN COMPARISON:  None Available. FINDINGS: Lower chest: Lung bases are clear. Hepatobiliary: No focal hepatic lesion. Postcholecystectomy. No biliary duct dilatation. Common bile duct is normal. Pancreas: No pancreatic duct dilatation. Retroperitoneal inflammation chest acute pancreatitis. Common bile duct normal caliber. Spleen: Normal spleen Adrenals/urinary tract: Is adrenal glands normal. No nephrolithiasis or ureterolithiasis. No obstructive uropathy. No bladder calculi. Stomach/Bowel: Stomach, small-bowel and cecum normal. Post appendectomy. The colon and rectosigmoid colon are normal. Vascular/Lymphatic: Abdominal aorta is normal caliber. No periportal or retroperitoneal adenopathy. No pelvic adenopathy.  Reproductive: Unremarkable Other: Small bilateral fat filled inguinal hernias. Musculoskeletal: Degenerative osteophytosis of the spine. IMPRESSION: 1. No acute findings in the abdomen pelvis. 2. No CT evidence of acute pancreatitis. 3. Postcholecystectomy. Electronically Signed: By: SSuzy BouchardM.D. On: 06/15/2022 14:03    Procedures Procedures   Medications Ordered in ED Medications  iohexol (OMNIPAQUE) 350 MG/ML injection 75 mL (75 mLs Intravenous Contrast Given 06/15/22 1347)    ED Course/ Medical Decision Making/ A&P Clinical Course as of 06/15/22 1650  Sun Jun 15, 2022  1622 Upper abdominal pain radiating to lower abdomen. Nausea, no vomiting. Started 3 days ago. Saw UC a few  days ago, performed labs and sent home w/ sucrafulate. Felt fine this AM, but got called about lipase in 300s so told to come here. Mild epigastric TTP now, lipase downtrending, CT w/o signs of pancreatitis. Getting EKG, trop, CXR to ensure no cardiac component - if all looks good DC with PCP f/u.  [DG]    Clinical Course User Index [DG] Renard Matter, MD                           Medical Decision Making Amount and/or Complexity of Data Reviewed Radiology: ordered.  Risk Prescription drug management.   56 year old male presents emerged from today for evaluation of elevated lipase.  Differential diagnosis includes was not much to pancreatitis, gallstones, abdominal pain, gallbladder.  Vital signs are unremarkable.  Patient normotensive, afebrile, normal pulse rate, satting well on room air without increased work of breathing.  Physical exam as noted above.  Labs and imaging ordered in triage.    I independently reviewed and interpreted the patient's labs. Lipase yesterday was 347 and is now 176 today.  Downtrending.  CBC shows no leukocytosis or anemia.  Urinalysis shows straw-colored urine that has greater than 5 any glucose.  CMP shows mildly noted glucose at 132 and mildly increased BUN at 21  otherwise no electrolyte or LFT abnormality.  CT imaging does not show any signs of pancreatitis.   Given the left sided chest pain that is nontender to palpation, will await CXR, trop, and EKG. If normal, the patient can be discharged with close PCP follow up.  5:01 PM Care of Troy [name]  transferred to Dr. Chari Manning at the end of my shift as the patient will require reassessment once labs/imaging have resulted. Patient presentation, ED course, and plan of care discussed with review of all pertinent labs and imaging. Please see his/her note for further details regarding further ED course and disposition. Plan at time of handoff is follow up on labs and imaging, if normal, can discharge home with close PCP follow up. This may be altered or completely changed at the discretion of the oncoming team pending results of further workup.    Final Clinical Impression(s) / ED Diagnoses Final diagnoses:  Elevated lipase  Epigastric abdominal pain    Rx / DC Orders ED Discharge Orders          Ordered    sucralfate (CARAFATE) 1 GM/10ML suspension  3 times daily with meals & bedtime        06/15/22 1604              Sherrell Puller, PA-C 06/15/22 1702    Sherwood Gambler, MD 06/17/22 [phone]

## 2022-06-15 NOTE — ED Triage Notes (Signed)
Pt arrived POV from home stating he went to urgent care yesterday for abdominal pain and nausea x3 days and they called today and told him to come get checked out here because his lipase was elevated and that he might have pancreatitis.

## 2022-06-15 NOTE — ED Notes (Signed)
Patient transported to CT 

## 2022-06-15 NOTE — ED Provider Notes (Signed)
  Assumption of Care    I assumed care of [name] on 06/15/2022 at 3 PM from [place], [place].   Briefly, Troy [name] is a 56 y.o. male who presented for abdominal pain as below. The signout from the previous ED provider included: Clinical Course as of 06/15/22 2031  Sun Jun 15, 2022  1622 Upper abdominal pain radiating to lower abdomen. Nausea, no vomiting. Started 3 days ago. Saw UC a few days ago, performed labs and sent home w/ sucrafulate. Felt fine this AM, but got called about lipase in 300s so told to come here. Mild epigastric TTP now, lipase downtrending, CT w/o signs of pancreatitis. Getting EKG, trop, CXR to ensure no cardiac component - if all looks good DC with PCP f/u.  [DG]    Clinical Course User Index [DG] Renard Matter, MD     Please refer to the original provider's note for additional information regarding the care of [name].  Reassessment: Vital Signs:  The most current vitals were  Vitals:   06/15/22 1719 06/15/22 1830  BP: 131/86 122/82  Pulse: 71 76  Resp: 13 10  Temp:    SpO2: 99% 100%    Physical Exam:  Hemodynamics:  The patient is hemodynamically stable. Mental Status:  The patient is alert. PE: No acute distress.  Normal respirations.   Additional MDM: On initial examination, patient overall well-appearing and hemodynamically stable.  Labs today have been overall unremarkable from an abdominal pain standpoint.  Additional labs were obtained to evaluate for possible cardiac cause of patient's symptoms.  These were also reassuring.  Specifically, troponin within normal limits at 13 and chest x-ray overall unremarkable without widened mediastinum, pneumothorax, pleural effusion, pulmonary edema, or enlarged cardiac silhouette.  EKG on my independent review demonstrates a sinus rhythm, 73 bpm.  There is PR prolongation to 217 ms, consistent with first-degree AV block.  No ST elevations or depressions, no T wave inversions  or hyperacute T waves to suggest acute ischemia.  CXR was notable however for possible fullness of left paramediastinal border, radiology recommended dedicated CT chest to further evaluate for possible underlying mass.  This was performed, which showed no mediastinal or other thoracic mass.  No pathologically enlarged lymph nodes.  Patient able to ambulate independently to the bathroom.  He remains well-appearing on repeat examination, and vital signs are reassuring.  I discussed reassuring work-up with the patient today, and that his symptoms may be secondary to resolving pancreatitis.  We discussed symptomatic management at home, and strict return precautions.  Patient voiced understanding of and agreement with plan to discharge, and was discharged in stable condition.   Diagnosis: 1. Elevated lipase   2. Epigastric abdominal pain            Renard Matter, MD 06/15/22 2031    Varney Biles, MD 06/15/22 2325

## 2022-06-16 ENCOUNTER — Other Ambulatory Visit (HOSPITAL_COMMUNITY): Payer: Self-pay

## 2022-06-17 ENCOUNTER — Ambulatory Visit: Payer: Medicare HMO | Admitting: Podiatry

## 2022-06-17 DIAGNOSIS — D2372 Other benign neoplasm of skin of left lower limb, including hip: Secondary | ICD-10-CM

## 2022-06-17 DIAGNOSIS — B351 Tinea unguium: Secondary | ICD-10-CM | POA: Diagnosis not present

## 2022-06-17 DIAGNOSIS — D2371 Other benign neoplasm of skin of right lower limb, including hip: Secondary | ICD-10-CM

## 2022-06-17 DIAGNOSIS — M79676 Pain in unspecified toe(s): Secondary | ICD-10-CM

## 2022-06-17 DIAGNOSIS — E1142 Type 2 diabetes mellitus with diabetic polyneuropathy: Secondary | ICD-10-CM | POA: Diagnosis not present

## 2022-06-18 DIAGNOSIS — I10 Essential (primary) hypertension: Secondary | ICD-10-CM | POA: Diagnosis not present

## 2022-06-18 DIAGNOSIS — N182 Chronic kidney disease, stage 2 (mild): Secondary | ICD-10-CM | POA: Diagnosis not present

## 2022-06-18 DIAGNOSIS — R109 Unspecified abdominal pain: Secondary | ICD-10-CM | POA: Diagnosis not present

## 2022-06-18 DIAGNOSIS — E782 Mixed hyperlipidemia: Secondary | ICD-10-CM | POA: Diagnosis not present

## 2022-06-18 DIAGNOSIS — F3341 Major depressive disorder, recurrent, in partial remission: Secondary | ICD-10-CM | POA: Diagnosis not present

## 2022-06-18 DIAGNOSIS — E113552 Type 2 diabetes mellitus with stable proliferative diabetic retinopathy, left eye: Secondary | ICD-10-CM | POA: Diagnosis not present

## 2022-06-18 DIAGNOSIS — R748 Abnormal levels of other serum enzymes: Secondary | ICD-10-CM | POA: Diagnosis not present

## 2022-06-18 DIAGNOSIS — K219 Gastro-esophageal reflux disease without esophagitis: Secondary | ICD-10-CM | POA: Diagnosis not present

## 2022-06-18 DIAGNOSIS — J45909 Unspecified asthma, uncomplicated: Secondary | ICD-10-CM | POA: Diagnosis not present

## 2022-06-22 NOTE — Progress Notes (Signed)
He presents today chief complaint of painful toenails right foot.  History amputation left leg.  Objective: Pulses barely palpable to the right lower extremity capillary fill time is immediate painful benign skin lesion plantar aspect of the second metatarsal head area of the right foot.  Toenails are long thick yellow dystrophic with mycotic.  Assessment: Mild peripheral vascular disease right lower extremity history amputation left.  Painful onychomycosis and benign skin lesion.  Plan: Benign skin lesion was debrided.  Debrided nails 1 through 5 right

## 2022-06-23 ENCOUNTER — Telehealth: Payer: Self-pay

## 2022-06-23 NOTE — Telephone Encounter (Signed)
     Patient  visit on 11/19  at [facility]  Have you been able to follow up with your primary care physician? Yes   The patient was or was not able to obtain any needed medicine or equipment. Yes   Are there diet recommendations that you are having difficulty following? Na   Patient expresses understanding of discharge instructions and education provided has no other needs at this time.  Yes     [place], [facility], Care Management  [phone] [address], [place], [place] [address] Phone: [phone] Email: [email or web address]

## 2022-08-19 ENCOUNTER — Encounter: Payer: Self-pay | Admitting: Podiatry

## 2022-08-19 ENCOUNTER — Ambulatory Visit: Payer: Medicare HMO | Admitting: Podiatry

## 2022-08-19 DIAGNOSIS — D2371 Other benign neoplasm of skin of right lower limb, including hip: Secondary | ICD-10-CM | POA: Diagnosis not present

## 2022-08-19 DIAGNOSIS — E1142 Type 2 diabetes mellitus with diabetic polyneuropathy: Secondary | ICD-10-CM | POA: Diagnosis not present

## 2022-08-19 DIAGNOSIS — R6889 Other general symptoms and signs: Secondary | ICD-10-CM | POA: Diagnosis not present

## 2022-08-19 DIAGNOSIS — M79676 Pain in unspecified toe(s): Secondary | ICD-10-CM

## 2022-08-19 DIAGNOSIS — B351 Tinea unguium: Secondary | ICD-10-CM | POA: Diagnosis not present

## 2022-08-19 NOTE — Progress Notes (Signed)
Presents today chief complaint of painful elongated toenails and callus right foot.  Objective: No open lesions or wounds.  Pulses are barely palpable.  Toenails are long thick yellow dystrophic and mycotic benign skin lesion plantar aspect of the right foot uncomplicated.  Assessment: Pain in limb secondary to onychomycosis and benign skin lesion right.  Plan: Debrided toenails 1 through 5 right and debrided benign skin lesion plantar aspect forefoot right.

## 2022-09-04 ENCOUNTER — Ambulatory Visit (INDEPENDENT_AMBULATORY_CARE_PROVIDER_SITE_OTHER): Payer: Medicare HMO

## 2022-09-04 DIAGNOSIS — Z89432 Acquired absence of left foot: Secondary | ICD-10-CM

## 2022-09-04 DIAGNOSIS — E1142 Type 2 diabetes mellitus with diabetic polyneuropathy: Secondary | ICD-10-CM

## 2022-09-04 DIAGNOSIS — R6889 Other general symptoms and signs: Secondary | ICD-10-CM | POA: Diagnosis not present

## 2022-09-04 DIAGNOSIS — M2011 Hallux valgus (acquired), right foot: Secondary | ICD-10-CM

## 2022-09-04 NOTE — Progress Notes (Signed)
Patient presents to the office today for diabetic shoe and insole measuring.  Patient was measured with brannock device to determine size and width for 1 pair of extra depth shoes and foam casted for 3 pair of insoles.   ABN signed.   Documentation of medical necessity will be sent to patient's treating diabetic doctor to verify and sign.   Patient's diabetic provider: Seward Carol, MD   Shoes and insoles will be ordered at that time and patient will be notified for an appointment for fitting when they arrive.   Brannock measurement: 12.5 XW  Patient shoe selection-   1st   Shoe choice:   [id number]  Shoe size ordered: 13 XW

## 2022-09-11 ENCOUNTER — Ambulatory Visit: Payer: Medicare HMO | Admitting: Podiatry

## 2022-09-11 DIAGNOSIS — M216X1 Other acquired deformities of right foot: Secondary | ICD-10-CM | POA: Diagnosis not present

## 2022-09-11 NOTE — Progress Notes (Signed)
Subjective:  Patient ID: Troy [name], male    DOB: 03-08-1966,  MRN: [id number]  Chief Complaint  Patient presents with   Callouses    57 y.o. male presents with the above complaint.  Patient presents with right submetatarsal 2 porokeratotic lesion painful to touch is progressive gotten worse worse with ambulation or shoe pressure blood from the breakdown he just had a debrided by Dr. Milinda Pointer but it started to cause him some pain so he wanted to get it eval make sure there is no ulceration he is a diabetic with last A1c of 6.5%.   Review of Systems: Negative except as noted in the HPI. Denies N/V/F/Ch.  Past Medical History:  Diagnosis Date   Asthma    Bronchitis    CKD (chronic kidney disease)    Diabetes mellitus    Diabetic Charcot foot ([place])    Left   Gout    Hypertension    Kidney stones 11/30/2013   Neuromuscular disorder (HCC)    Obesity    Pancreatitis    Polyneuropathy in diabetes ([place])    Sleep apnea     Current Outpatient Medications:    acetaminophen (TYLENOL) 500 MG tablet, Take 1,000 mg by mouth every 8 (eight) hours as needed for moderate pain., Disp: , Rfl:    albuterol (PROVENTIL HFA;VENTOLIN HFA) 108 (90 Base) MCG/ACT inhaler, Inhale 1-2 puffs into the lungs every 6 (six) hours as needed for wheezing or shortness of breath., Disp: 1 Inhaler, Rfl: 0   albuterol (PROVENTIL) (2.5 MG/3ML) 0.083% nebulizer solution, USE 3 ML PER NEBULIZATION EVERY 4 HOURS AS NEEDED FOR WHEEZING, Disp: 75 mL, Rfl: 1   Alcohol Swabs (B-D SINGLE USE SWABS REGULAR) PADS, , Disp: , Rfl:    allopurinol (ZYLOPRIM) 300 MG tablet, Take 300 mg by mouth daily., Disp: , Rfl:    atorvastatin (LIPITOR) 10 MG tablet, Take 10 mg by mouth daily., Disp: , Rfl: 10   Blood Glucose Monitoring Suppl (ACCU-CHEK AVIVA PLUS) w/Device KIT, USE TO TEST YOUR BLOOD SUGAR THREE TIMES A DAY, Disp: , Rfl:    budesonide-formoterol (SYMBICORT) 160-4.5 MCG/ACT inhaler, Inhale 2 puffs into the lungs 2 (two)  times daily., Disp: , Rfl:    cephALEXin (KEFLEX) 500 MG capsule, Take 1 capsule (500 mg total) by mouth 4 (four) times daily for 5 days, Disp: 20 capsule, Rfl: 0   Colchicine 0.6 MG CAPS, Take 0.6 mg by mouth daily as needed (gout pain)., Disp: , Rfl:    dorzolamide-timolol (COSOPT) 22.3-6.8 MG/ML ophthalmic solution, 1 drop 2 (two) times daily., Disp: , Rfl:    DROPLET PEN NEEDLES 31G X 6 MM MISC, , Disp: , Rfl:    fluticasone (FLONASE) 50 MCG/ACT nasal spray, USE ONE SPRAY IN EACH NOSTRIL ONCE DAILY 30 60 (Patient taking differently: Place 1 spray into both nostrils daily.), Disp: 16 g, Rfl: 5   gabapentin (NEURONTIN) 300 MG capsule, Take 300 mg by mouth at bedtime., Disp: , Rfl:    glimepiride (AMARYL) 4 MG tablet, Take 4 mg by mouth daily with breakfast., Disp: , Rfl:    Insulin Glargine (BASAGLAR KWIKPEN) 100 UNIT/ML, Inject 25 Units into the skin at bedtime., Disp: , Rfl:    lisinopril (ZESTRIL) 20 MG tablet, , Disp: , Rfl:    lisinopril-hydrochlorothiazide (PRINZIDE,ZESTORETIC) 10-12.5 MG tablet, Take 1 tablet by mouth daily. , Disp: , Rfl: 2   metFORMIN (GLUCOPHAGE) 1000 MG tablet, Take 1,000 mg by mouth 2 (two) times daily., Disp: , Rfl: 10  MICROLET LANCETS MISC, 1 each daily as needed., Disp: , Rfl: 5   Multiple Vitamin (MULTIVITAMIN WITH MINERALS) TABS tablet, Take 1 tablet by mouth daily., Disp: , Rfl:    pantoprazole (PROTONIX) 40 MG tablet, Take 40 mg by mouth daily. , Disp: , Rfl: 0   sildenafil (VIAGRA) 100 MG tablet, Take 100 mg by mouth as needed for erectile dysfunction., Disp: , Rfl:    sucralfate (CARAFATE) 1 GM/10ML suspension, Take 10 mLs (1 g total) by mouth 4 (four) times daily -  with meals and at bedtime., Disp: 420 mL, Rfl: 0   tetrahydrozoline-zinc (VISINE-AC) 0.05-0.25 % ophthalmic solution, Place 2 drops into both eyes 3 (three) times daily as needed (dry eyes)., Disp: , Rfl:    traMADol (ULTRAM) 50 MG tablet, Take 1 tablet (50 mg total) by mouth every 6 (six)  hours as needed (for pain)., Disp: 30 tablet, Rfl: 1   TRUE METRIX BLOOD GLUCOSE TEST test strip, 1 each 3 (three) times daily., Disp: , Rfl:    Wound Dressings ([place] WOUND/BURN DRESSING) PSTE, Apply to wound as directed, Disp: 103 mL, Rfl: 0  Social History   Tobacco Use  Smoking Status Never  Smokeless Tobacco Never    No Known Allergies Objective:  There were no vitals filed for this visit. There is no height or weight on file to calculate BMI. Constitutional Well developed. Well nourished.  Vascular Dorsalis pedis pulses palpable bilaterally. Posterior tibial pulses palpable bilaterally. Capillary refill normal to all digits.  No cyanosis or clubbing noted. Pedal hair growth normal.  Neurologic Normal speech. Oriented to person, place, and time. Epicritic sensation to light touch grossly present bilaterally.  Dermatologic Right submetatarsal 2 porokeratotic lesion with plantarflexed second metatarsal.  Pain on palpation no underlying ulceration noted.  Preulcerative callus noted.  Orthopedic: Normal joint ROM without pain or crepitus bilaterally. No visible deformities. No bony tenderness.   Radiographs: None Assessment:   1. Plantar flexed metatarsal bone of right foot    Plan:  Patient was evaluated and treated and all questions answered.  Right submetatarsal 2 plantarflexed metatarsal -All questions and concerns were discussed with the patient in extensive detail. Small-I explained to the patient that given the plantarflexed metatarsal likely driving this callus formation he would eventually benefit from a floating osteotomy of the second.  He states he will think about it and get back to me when it is becoming more painful and worsening. -I discussed shoe gear modification  No follow-ups on file.

## 2022-09-12 DIAGNOSIS — E113513 Type 2 diabetes mellitus with proliferative diabetic retinopathy with macular edema, bilateral: Secondary | ICD-10-CM | POA: Diagnosis not present

## 2022-09-12 DIAGNOSIS — H40053 Ocular hypertension, bilateral: Secondary | ICD-10-CM | POA: Diagnosis not present

## 2022-09-12 DIAGNOSIS — R6889 Other general symptoms and signs: Secondary | ICD-10-CM | POA: Diagnosis not present

## 2022-09-12 DIAGNOSIS — H3582 Retinal ischemia: Secondary | ICD-10-CM | POA: Diagnosis not present

## 2022-09-12 DIAGNOSIS — Z961 Presence of intraocular lens: Secondary | ICD-10-CM | POA: Diagnosis not present

## 2022-09-23 DIAGNOSIS — H59813 Chorioretinal scars after surgery for detachment, bilateral: Secondary | ICD-10-CM | POA: Diagnosis not present

## 2022-09-23 DIAGNOSIS — H401132 Primary open-angle glaucoma, bilateral, moderate stage: Secondary | ICD-10-CM | POA: Diagnosis not present

## 2022-09-23 DIAGNOSIS — H3582 Retinal ischemia: Secondary | ICD-10-CM | POA: Diagnosis not present

## 2022-09-23 DIAGNOSIS — H35372 Puckering of macula, left eye: Secondary | ICD-10-CM | POA: Diagnosis not present

## 2022-09-23 DIAGNOSIS — E113513 Type 2 diabetes mellitus with proliferative diabetic retinopathy with macular edema, bilateral: Secondary | ICD-10-CM | POA: Diagnosis not present

## 2022-09-23 DIAGNOSIS — R6889 Other general symptoms and signs: Secondary | ICD-10-CM | POA: Diagnosis not present

## 2022-09-26 ENCOUNTER — Ambulatory Visit (INDEPENDENT_AMBULATORY_CARE_PROVIDER_SITE_OTHER): Payer: Medicare HMO

## 2022-09-26 DIAGNOSIS — M2011 Hallux valgus (acquired), right foot: Secondary | ICD-10-CM

## 2022-09-26 DIAGNOSIS — Z89432 Acquired absence of left foot: Secondary | ICD-10-CM

## 2022-09-26 DIAGNOSIS — E1142 Type 2 diabetes mellitus with diabetic polyneuropathy: Secondary | ICD-10-CM

## 2022-09-26 NOTE — Progress Notes (Signed)
Patient presents today to pick up diabetic shoes and insoles.  He tried on the shoes with the insoles and the fit was too small.  Will reorder shoe in a wider width and reschedule patient.

## 2022-10-07 ENCOUNTER — Ambulatory Visit: Payer: Medicare HMO | Admitting: Podiatry

## 2022-10-07 ENCOUNTER — Encounter: Payer: Self-pay | Admitting: Podiatry

## 2022-10-07 DIAGNOSIS — D2371 Other benign neoplasm of skin of right lower limb, including hip: Secondary | ICD-10-CM | POA: Diagnosis not present

## 2022-10-07 NOTE — Progress Notes (Signed)
He presents today for callus trim subsecond metatarsal head of the right foot.  Objective: Vital signs stable he is alert oriented x 3 there is no erythema edema salines drainage odor pulses are palpable right foot.  Hammertoe deformities rigid at the second metatarsophalangeal joint.  Reactive benign skin lesion subsecond metatarsal phalangeal joint.  Plan: Debrided benign skin lesion today discussed possible need for extensor tenotomy flexor tenotomy.

## 2022-10-14 ENCOUNTER — Ambulatory Visit (INDEPENDENT_AMBULATORY_CARE_PROVIDER_SITE_OTHER): Payer: Medicare HMO

## 2022-10-14 DIAGNOSIS — Z89432 Acquired absence of left foot: Secondary | ICD-10-CM

## 2022-10-14 DIAGNOSIS — E1142 Type 2 diabetes mellitus with diabetic polyneuropathy: Secondary | ICD-10-CM | POA: Diagnosis not present

## 2022-10-14 DIAGNOSIS — M2011 Hallux valgus (acquired), right foot: Secondary | ICD-10-CM | POA: Diagnosis not present

## 2022-10-14 NOTE — Progress Notes (Addendum)
Patient presents today to pick up diabetic shoes and insoles.  Patient was dispensed 1 pair of diabetic shoes and 3 foam casted diabetic insoles.   He tried on the shoes with the insoles and the fit was satisfactory.   Will follow up next year for new order.

## 2022-10-16 DIAGNOSIS — R109 Unspecified abdominal pain: Secondary | ICD-10-CM | POA: Diagnosis not present

## 2022-10-16 DIAGNOSIS — R079 Chest pain, unspecified: Secondary | ICD-10-CM | POA: Diagnosis not present

## 2022-10-19 ENCOUNTER — Other Ambulatory Visit: Payer: Self-pay

## 2022-10-19 ENCOUNTER — Emergency Department (HOSPITAL_COMMUNITY)
Admission: EM | Admit: 2022-10-19 | Discharge: 2022-10-20 | Disposition: A | Discharge status: Home / Self Care | Payer: Medicare HMO | Attending: Emergency Medicine | Admitting: Emergency Medicine

## 2022-10-19 ENCOUNTER — Encounter (HOSPITAL_COMMUNITY): Payer: Self-pay | Admitting: Emergency Medicine

## 2022-10-19 ENCOUNTER — Emergency Department (HOSPITAL_COMMUNITY): Payer: Medicare HMO

## 2022-10-19 DIAGNOSIS — N189 Chronic kidney disease, unspecified: Secondary | ICD-10-CM | POA: Diagnosis not present

## 2022-10-19 DIAGNOSIS — Z7984 Long term (current) use of oral hypoglycemic drugs: Secondary | ICD-10-CM | POA: Diagnosis not present

## 2022-10-19 DIAGNOSIS — R0789 Other chest pain: Secondary | ICD-10-CM | POA: Insufficient documentation

## 2022-10-19 DIAGNOSIS — Z794 Long term (current) use of insulin: Secondary | ICD-10-CM | POA: Diagnosis not present

## 2022-10-19 DIAGNOSIS — I129 Hypertensive chronic kidney disease with stage 1 through stage 4 chronic kidney disease, or unspecified chronic kidney disease: Secondary | ICD-10-CM | POA: Insufficient documentation

## 2022-10-19 DIAGNOSIS — E1122 Type 2 diabetes mellitus with diabetic chronic kidney disease: Secondary | ICD-10-CM | POA: Insufficient documentation

## 2022-10-19 DIAGNOSIS — R079 Chest pain, unspecified: Secondary | ICD-10-CM | POA: Diagnosis not present

## 2022-10-19 DIAGNOSIS — Z79899 Other long term (current) drug therapy: Secondary | ICD-10-CM | POA: Insufficient documentation

## 2022-10-19 DIAGNOSIS — K859 Acute pancreatitis without necrosis or infection, unspecified: Secondary | ICD-10-CM | POA: Diagnosis not present

## 2022-10-19 DIAGNOSIS — R1011 Right upper quadrant pain: Secondary | ICD-10-CM | POA: Diagnosis present

## 2022-10-19 LAB — URINALYSIS, ROUTINE W REFLEX MICROSCOPIC
Bacteria, UA: NONE SEEN
Bilirubin Urine: NEGATIVE
Glucose, UA: >=500 mg/dL — AB
Hgb urine dipstick: NEGATIVE
Ketones, ur: NEGATIVE mg/dL
Leukocytes,Ua: NEGATIVE
Nitrite: NEGATIVE
Protein, ur: NEGATIVE mg/dL
Specific Gravity, Urine: 1.005 (ref 1.005–1.030)
pH: 5 (ref 5.0–8.0)

## 2022-10-19 LAB — COMPREHENSIVE METABOLIC PANEL
ALT: 33 U/L (ref 0–44)
AST: 28 U/L (ref 15–41)
Albumin: 3.8 g/dL (ref 3.5–5.0)
Alkaline Phosphatase: 60 U/L (ref 38–126)
Anion gap: 12 (ref 5–15)
BUN: 23 mg/dL — ABNORMAL HIGH (ref 6–20)
CO2: 19 mmol/L — ABNORMAL LOW (ref 22–32)
Calcium: 9 mg/dL (ref 8.9–10.3)
Chloride: 103 mmol/L (ref 98–111)
Creatinine, Ser: 1.33 mg/dL — ABNORMAL HIGH (ref 0.61–1.24)
GFR, Estimated: >60 mL/min (ref >60)
Glucose, Bld: 137 mg/dL — ABNORMAL HIGH (ref 70–99)
Potassium: 4.2 mmol/L (ref 3.5–5.1)
Sodium: 134 mmol/L — ABNORMAL LOW (ref 135–145)
Total Bilirubin: 0.6 mg/dL (ref 0.3–1.2)
Total Protein: 7.2 g/dL (ref 6.5–8.1)

## 2022-10-19 LAB — CBC
HCT: 45.8 % (ref 39.0–52.0)
Hemoglobin: 15.9 g/dL (ref 13.0–17.0)
MCH: 30.1 pg (ref 26.0–34.0)
MCHC: 34.7 g/dL (ref 30.0–36.0)
MCV: 86.7 fL (ref 80.0–100.0)
Platelets: 169 10*3/uL (ref 150–400)
RBC: 5.28 MIL/uL (ref 4.22–5.81)
RDW: 15.7 % — ABNORMAL HIGH (ref 11.5–15.5)
WBC: 8.7 10*3/uL (ref 4.0–10.5)
nRBC: 0 % (ref 0.0–0.2)

## 2022-10-19 LAB — LIPASE, BLOOD: Lipase: 140 U/L — ABNORMAL HIGH (ref 11–51)

## 2022-10-19 LAB — TROPONIN I (HIGH SENSITIVITY): Troponin I (High Sensitivity): 16 ng/L (ref <18)

## 2022-10-19 MED ORDER — LACTATED RINGERS IV BOLUS
1000.0000 mL | Freq: Once | INTRAVENOUS | Status: AC
  Administered 2022-10-19: 1000 mL via INTRAVENOUS

## 2022-10-19 NOTE — ED Triage Notes (Signed)
Pt reports has had right upper abdominal pain since last Tuesday. Seen by UC on Thursday, had blood drawn, called today to let him know he had elevated lipase. Pt reports pain is worse after eating and radiates to his back. Pt reports diarrhea, no nausea, vomiting, fever. VSS at present.

## 2022-10-19 NOTE — Discharge Instructions (Addendum)
Your labs were overall okay other than a slightly elevated lipase. You were given a bolus of fluid today with some improvement of your symptoms. Your urine did not show any signs of infection or blood.  Your kidney function today was a little bit elevated but this is likely due to dehydration.  Your chest x-ray showed no signs of pneumonia or lung injury at this time.  Your EKG did not show any signs of a heart attack at this time either. With a normal EKG and both the levels of the troponin we discussed being normal, it is very unlikely you had a heart attack as a cause of your chest pain.  Please take Norco as needed for pain, do not take additional Tylenol with this medication.  I recommend that you follow-up with your GI doctor, and follow-up with your primary care doctor for recheck of your labs to make sure that your kidney function improves.  If you have severe or worsening of the chest pain, difficulty breathing, fever, intractable vomiting or other concerning symptoms please return to the ED.

## 2022-10-19 NOTE — ED Provider Notes (Signed)
[facility] Provider Note   CSN: [id number] Arrival date & time: 10/19/22  1826     History {Add pertinent medical, surgical, social history, OB history to HPI:1} Chief Complaint  Patient presents with   Abdominal Pain    Troy [name] is a 57 y.o. male T2DM, HTN, CKD, chronic osteomyelitis complaining of right upper abdominal pain since Tuesday, was recently seen at urgent care on Thursday called to be notified that his lipase was elevated when they called earlier today.  Patient states that this pain feels very similar to his previous pancreatitis bouts has not required admission since the initial 1 after which they cholecystectomy.  He states that this episode is slightly different and that today and yesterday he had intermittent lower chest pain that at times radiated up to his jaw. Denies fevers, nausea, vomiting, cough, shortness of breath, diarrhea, dysuria or hematuria   Abdominal Pain      Home Medications Prior to Admission medications   Medication Sig Start Date End Date Taking? Authorizing Provider  acetaminophen (TYLENOL) 500 MG tablet Take 1,000 mg by mouth every 8 (eight) hours as needed for moderate pain.    [provider]  albuterol (PROVENTIL HFA;VENTOLIN HFA) 108 (90 Base) MCG/ACT inhaler Inhale 1-2 puffs into the lungs every 6 (six) hours as needed for wheezing or shortness of breath. 08/25/15   Hyman Bible, PA-C  albuterol (PROVENTIL) (2.5 MG/3ML) 0.083% nebulizer solution USE 3 ML PER NEBULIZATION EVERY 4 HOURS AS NEEDED FOR WHEEZING 06/28/20 06/28/21  Katy Apo, NP  Alcohol Swabs (B-D SINGLE USE SWABS REGULAR) PADS  07/29/19   [provider]  allopurinol (ZYLOPRIM) 300 MG tablet Take 300 mg by mouth daily. 03/09/22   [provider]  atorvastatin (LIPITOR) 10 MG tablet Take 10 mg by mouth daily. 03/06/17   [provider]  Blood Glucose Monitoring Suppl (ACCU-CHEK AVIVA  PLUS) w/Device KIT USE TO TEST YOUR BLOOD SUGAR THREE TIMES A DAY 01/12/19   [provider]  budesonide-formoterol (SYMBICORT) 160-4.5 MCG/ACT inhaler Inhale 2 puffs into the lungs 2 (two) times daily.    [provider]  cephALEXin (KEFLEX) 500 MG capsule Take 1 capsule (500 mg total) by mouth 4 (four) times daily for 5 days 02/05/22   Lorenda Peck, DPM  Colchicine 0.6 MG CAPS Take 0.6 mg by mouth daily as needed (gout pain). 02/07/19   [provider]  dorzolamide-timolol (COSOPT) 22.3-6.8 MG/ML ophthalmic solution 1 drop 2 (two) times daily. 01/15/22   [provider]  DROPLET PEN NEEDLES 31G X 6 MM [place]  07/29/19   [provider]  fluticasone (FLONASE) 50 MCG/ACT nasal spray USE ONE SPRAY IN EACH NOSTRIL ONCE DAILY 30 60 Patient taking differently: Place 1 spray into both nostrils daily. 06/29/20 06/29/21  Seward Carol, MD  gabapentin (NEURONTIN) 300 MG capsule Take 300 mg by mouth at bedtime. 02/03/20   [provider]  glimepiride (AMARYL) 4 MG tablet Take 4 mg by mouth daily with breakfast.    [provider]  Insulin Glargine (BASAGLAR KWIKPEN) 100 UNIT/ML Inject 25 Units into the skin at bedtime.    [provider]  lisinopril (ZESTRIL) 20 MG tablet  01/14/22   [provider]  lisinopril-hydrochlorothiazide (PRINZIDE,ZESTORETIC) 10-12.5 MG tablet Take 1 tablet by mouth daily.  05/06/16   [provider]  metFORMIN (GLUCOPHAGE) 1000 MG tablet Take 1,000 mg by mouth 2 (two) times daily. 03/16/17   [provider]  MICROLET LANCETS MISC 1 each daily as needed. 02/16/17   [provider]  Multiple Vitamin (MULTIVITAMIN WITH MINERALS) TABS tablet Take 1 tablet by mouth daily.    [provider]  pantoprazole (PROTONIX) 40 MG tablet Take 40 mg by mouth daily.  06/11/16   [provider]  sildenafil (VIAGRA) 100 MG tablet Take 100 mg by mouth as needed for erectile dysfunction.  01/04/19   [provider]  sucralfate (CARAFATE) 1 GM/10ML suspension Take 10 mLs (1 g total) by mouth 4 (four) times daily -  with meals and at bedtime. 06/15/22   Sherrell Puller, PA-C  tetrahydrozoline-zinc (VISINE-AC) 0.05-0.25 % ophthalmic solution Place 2 drops into both eyes 3 (three) times daily as needed (dry eyes).    [provider]  traMADol (ULTRAM) 50 MG tablet Take 1 tablet (50 mg total) by mouth every 6 (six) hours as needed (for pain). 02/06/22   Molpus, John, MD  TRUE METRIX BLOOD GLUCOSE TEST test strip 1 each 3 (three) times daily. 01/30/22   [provider]  Wound Dressings ([place] WOUND/BURN DRESSING) PSTE Apply to wound as directed 09/09/21   Lynden Oxford Scales, PA-C      Allergies    Patient has no known allergies.    Review of Systems   Review of Systems  Gastrointestinal:  Positive for abdominal pain.    Physical Exam Updated Vital Signs BP 111/78   Pulse 78   Temp 98.3 F (36.8 C) (Oral)   Resp 18   Ht 5\' 10"  (1.778 m)   Wt 128.8 kg   SpO2 96%   BMI 40.75 kg/m  Physical Exam Vitals and nursing note reviewed.  Constitutional:      General: He is not in acute distress.    Appearance: He is well-developed.  HENT:     Head: Normocephalic and atraumatic.     Nose: Nose normal.     Mouth/Throat:     Mouth: Mucous membranes are moist.     Pharynx: Oropharynx is clear.  Eyes:     Conjunctiva/sclera: Conjunctivae normal.     Pupils: Pupils are equal, round, and reactive to light.  Cardiovascular:     Rate and Rhythm: Normal rate and regular rhythm.     Pulses: Normal pulses.     Heart sounds: Normal heart sounds. No murmur heard. Pulmonary:     Effort: Pulmonary effort is normal. No respiratory distress.     Breath sounds: Normal breath sounds.  Abdominal:     General: Abdomen is flat. There is no distension.     Palpations: Abdomen is soft.     Tenderness: There is abdominal tenderness (epigastric and RUQ) in the right  upper quadrant and epigastric area.  Musculoskeletal:        General: No swelling.     Cervical back: Neck supple.  Skin:    General: Skin is warm and dry.     Capillary Refill: Capillary refill takes less than 2 seconds.     Coloration: Skin is not jaundiced.     Findings: No lesion or rash.  Neurological:     Mental Status: He is alert and oriented to person, place, and time.  Psychiatric:        Mood and Affect: Mood normal.     ED Results / Procedures / Treatments   Labs (all labs ordered are listed, but only abnormal results are displayed) Labs Reviewed  LIPASE, BLOOD - Abnormal; Notable for the following components:  Result Value   Lipase 140 (*)    All other components within normal limits  COMPREHENSIVE METABOLIC PANEL - Abnormal; Notable for the following components:   Sodium 134 (*)    CO2 19 (*)    Glucose, Bld 137 (*)    BUN 23 (*)    Creatinine, Ser 1.33 (*)    All other components within normal limits  CBC - Abnormal; Notable for the following components:   RDW 15.7 (*)    All other components within normal limits  URINALYSIS, ROUTINE W REFLEX MICROSCOPIC  TROPONIN I (HIGH SENSITIVITY)    EKG None  Radiology DG Chest Portable 1 View  Result Date: 10/19/2022 CLINICAL DATA:  Chest pain EXAM: PORTABLE CHEST 1 VIEW COMPARISON:  06/15/2022 FINDINGS: The heart size and mediastinal contours are within normal limits. Both lungs are clear. The visualized skeletal structures are unremarkable. IMPRESSION: No active disease. Electronically Signed   By: Lucienne Capers M.D.   On: 10/19/2022 21:42    Procedures Procedures  {Document cardiac monitor, telemetry assessment procedure when appropriate:1}  Medications Ordered in ED Medications  lactated ringers bolus 1,000 mL (1,000 mLs Intravenous New Bag/Given 10/19/22 2234)    ED Course/ Medical Decision Making/ A&P   {   Click here for ABCD2, HEART and other calculatorsREFRESH Note before signing :1}                           Medical Decision Making Amount and/or Complexity of Data Reviewed Labs: ordered. Radiology: ordered.   Patient presents hemodynamically stable.  On abdominal exam he has tenderness in epigastric and RUQ.  He has no notable distention or overlying skin changes.   Labs are significant for slightly elevated creatinine, but no AKI.  Patient receiving 1 L LR bolus.  Lipase here is 140.  He has no notable leukocytosis  Chest x-ray negative for focal consolidation, pleural effusion or edema.  EKG is sinus with notable low voltage, no signs of acute ischemia at this time.  {Document critical care time when appropriate:1} {Document review of labs and clinical decision tools ie heart score, Chads2Vasc2 etc:1}  {Document your independent review of radiology images, and any outside records:1} {Document your discussion with family members, caretakers, and with consultants:1} {Document social determinants of health affecting pt's care:1} {Document your decision making why or why not admission, treatments were needed:1} Final Clinical Impression(s) / ED Diagnoses Final diagnoses:  None

## 2022-10-20 ENCOUNTER — Other Ambulatory Visit (HOSPITAL_COMMUNITY): Payer: Self-pay

## 2022-10-20 ENCOUNTER — Other Ambulatory Visit: Payer: Self-pay

## 2022-10-20 LAB — TROPONIN I (HIGH SENSITIVITY): Troponin I (High Sensitivity): 14 ng/L (ref <18)

## 2022-10-20 MED ORDER — HYDROCODONE-ACETAMINOPHEN 5-325 MG PO TABS
1.0000 | ORAL_TABLET | Freq: Four times a day (QID) | ORAL | 0 refills | Status: AC | PRN
  Filled 2022-10-20: qty 10, 3d supply, fill #0

## 2022-10-20 NOTE — ED Provider Notes (Signed)
Physical Exam  BP 106/71 (BP Location: Right Arm)   Pulse 79   Temp 97.8 F (36.6 C) (Oral)   Resp 18   Ht 5\' 10"  (1.778 m)   Wt 128.8 kg   SpO2 99%   BMI 40.75 kg/m   Physical Exam Vitals and nursing note reviewed.  Constitutional:      General: He is not in acute distress.    Appearance: He is well-developed.  HENT:     Head: Normocephalic and atraumatic.  Eyes:     Conjunctiva/sclera: Conjunctivae normal.  Cardiovascular:     Rate and Rhythm: Normal rate and regular rhythm.     Heart sounds: No murmur heard. Pulmonary:     Effort: Pulmonary effort is normal. No respiratory distress.     Breath sounds: Normal breath sounds.  Abdominal:     Palpations: Abdomen is soft.     Tenderness: There is no abdominal tenderness.  Musculoskeletal:        General: No swelling.     Cervical back: Neck supple.  Skin:    General: Skin is warm and dry.     Capillary Refill: Capillary refill takes less than 2 seconds.  Neurological:     Mental Status: He is alert.  Psychiatric:        Mood and Affect: Mood normal.     Procedures  Procedures  ED Course / MDM    Medical Decision Making Amount and/or Complexity of Data Reviewed Labs: ordered. Radiology: ordered.   Results for orders placed or performed during the hospital encounter of 10/19/22  Lipase, blood  Result Value Ref Range   Lipase 140 (H) 11 - 51 U/L  Comprehensive metabolic panel  Result Value Ref Range   Sodium 134 (L) 135 - 145 mmol/L   Potassium 4.2 3.5 - 5.1 mmol/L   Chloride 103 98 - 111 mmol/L   CO2 19 (L) 22 - 32 mmol/L   Glucose, Bld 137 (H) 70 - 99 mg/dL   BUN 23 (H) 6 - 20 mg/dL   Creatinine, Ser 1.33 (H) 0.61 - 1.24 mg/dL   Calcium 9.0 8.9 - 10.3 mg/dL   Total Protein 7.2 6.5 - 8.1 g/dL   Albumin 3.8 3.5 - 5.0 g/dL   AST 28 15 - 41 U/L   ALT 33 0 - 44 U/L   Alkaline Phosphatase 60 38 - 126 U/L   Total Bilirubin 0.6 0.3 - 1.2 mg/dL   GFR, Estimated >60 >60 mL/min   Anion gap 12 5 - 15   CBC  Result Value Ref Range   WBC 8.7 4.0 - 10.5 K/uL   RBC 5.28 4.22 - 5.81 MIL/uL   Hemoglobin 15.9 13.0 - 17.0 g/dL   HCT 45.8 39.0 - 52.0 %   MCV 86.7 80.0 - 100.0 fL   MCH 30.1 26.0 - 34.0 pg   MCHC 34.7 30.0 - 36.0 g/dL   RDW 15.7 (H) 11.5 - 15.5 %   Platelets 169 150 - 400 K/uL   nRBC 0.0 0.0 - 0.2 %  Urinalysis, Routine w reflex microscopic -Urine, Clean Catch  Result Value Ref Range   Color, Urine STRAW (A) YELLOW   APPearance CLEAR CLEAR   Specific Gravity, Urine 1.005 1.005 - 1.030   pH 5.0 5.0 - 8.0   Glucose, UA >=500 (A) NEGATIVE mg/dL   Hgb urine dipstick NEGATIVE NEGATIVE   Bilirubin Urine NEGATIVE NEGATIVE   Ketones, ur NEGATIVE NEGATIVE mg/dL   Protein, ur NEGATIVE NEGATIVE mg/dL  Nitrite NEGATIVE NEGATIVE   Leukocytes,Ua NEGATIVE NEGATIVE   RBC / HPF 0-5 0 - 5 RBC/hpf   WBC, UA 0-5 0 - 5 WBC/hpf   Bacteria, UA NONE SEEN NONE SEEN   Squamous Epithelial / HPF 0-5 0 - 5 /HPF  Troponin I (High Sensitivity)  Result Value Ref Range   Troponin I (High Sensitivity) 16 <18 ng/L  Troponin I (High Sensitivity)  Result Value Ref Range   Troponin I (High Sensitivity) 14 <18 ng/L   DG Chest Portable 1 View  Result Date: 10/19/2022 CLINICAL DATA:  Chest pain EXAM: PORTABLE CHEST 1 VIEW COMPARISON:  06/15/2022 FINDINGS: The heart size and mediastinal contours are within normal limits. Both lungs are clear. The visualized skeletal structures are unremarkable. IMPRESSION: No active disease. Electronically Signed   By: Lucienne Capers M.D.   On: 10/19/2022 21:42    Care received in signout.  Please see prior providers note for full HPI and ED course.  Care signed out pending repeat troponin for possible atypical chest pain.  Patient has a known history of pancreatitis, lipase today 140, not consistent with acute pancreatitis flare.  He was given IV fluids with resolution of his pain.  He had some right upper quadrant tenderness on exam initially, however pain improved on  my exam and  his LFTs, bilirubin are normal.  His initial troponin prior to my evaluation was normal.  His creatinine is slightly elevated but not consistent with AKI.  UA without any evidence of infection.  Given RUQ pain, considered imaging but given normal LFTs and bilirubin I have low suspicion for gallbladder or liver pathology.  His repeat troponin is normal.  His EKG reviewed, nonischemic.  Chest x-ray reviewed, agree with radiology, negative for acute findings.  On my reevaluation, patient is completely pain-free and asymptomatic.  I recommended following up with his GI provider which he agreed to.  We discussed return precautions.  He was given Norco as needed for pain, PDMP reviewed, appropriate.  He voiced understanding and is agreeable.  Stable for discharge.  Case discussed with Dr. Ralene Bathe who is agreeable to the above plan and disposition.       Lyndel Safe 10/20/22 0151    Quintella Reichert, MD 10/20/22 [phone]

## 2022-10-22 DIAGNOSIS — E7800 Pure hypercholesterolemia, unspecified: Secondary | ICD-10-CM | POA: Diagnosis not present

## 2022-10-22 DIAGNOSIS — E119 Type 2 diabetes mellitus without complications: Secondary | ICD-10-CM | POA: Diagnosis not present

## 2022-10-22 DIAGNOSIS — J45909 Unspecified asthma, uncomplicated: Secondary | ICD-10-CM | POA: Diagnosis not present

## 2022-10-22 DIAGNOSIS — K219 Gastro-esophageal reflux disease without esophagitis: Secondary | ICD-10-CM | POA: Diagnosis not present

## 2022-10-22 DIAGNOSIS — M1A9XX1 Chronic gout, unspecified, with tophus (tophi): Secondary | ICD-10-CM | POA: Diagnosis not present

## 2022-10-22 DIAGNOSIS — R6889 Other general symptoms and signs: Secondary | ICD-10-CM | POA: Diagnosis not present

## 2022-10-22 DIAGNOSIS — Z79899 Other long term (current) drug therapy: Secondary | ICD-10-CM | POA: Diagnosis not present

## 2022-10-22 DIAGNOSIS — I10 Essential (primary) hypertension: Secondary | ICD-10-CM | POA: Diagnosis not present

## 2022-10-22 DIAGNOSIS — M1990 Unspecified osteoarthritis, unspecified site: Secondary | ICD-10-CM | POA: Diagnosis not present

## 2022-10-22 DIAGNOSIS — N182 Chronic kidney disease, stage 2 (mild): Secondary | ICD-10-CM | POA: Diagnosis not present

## 2022-10-22 DIAGNOSIS — E113552 Type 2 diabetes mellitus with stable proliferative diabetic retinopathy, left eye: Secondary | ICD-10-CM | POA: Diagnosis not present

## 2022-10-22 DIAGNOSIS — F3341 Major depressive disorder, recurrent, in partial remission: Secondary | ICD-10-CM | POA: Diagnosis not present

## 2022-10-27 ENCOUNTER — Emergency Department (HOSPITAL_COMMUNITY)
Admission: EM | Admit: 2022-10-27 | Discharge: 2022-10-28 | Disposition: A | Discharge status: Home / Self Care | Payer: Medicare HMO | Attending: Emergency Medicine | Admitting: Emergency Medicine

## 2022-10-27 ENCOUNTER — Other Ambulatory Visit: Payer: Self-pay

## 2022-10-27 ENCOUNTER — Emergency Department (HOSPITAL_COMMUNITY): Payer: Medicare HMO

## 2022-10-27 DIAGNOSIS — J45909 Unspecified asthma, uncomplicated: Secondary | ICD-10-CM | POA: Diagnosis not present

## 2022-10-27 DIAGNOSIS — Z79899 Other long term (current) drug therapy: Secondary | ICD-10-CM | POA: Diagnosis not present

## 2022-10-27 DIAGNOSIS — M1711 Unilateral primary osteoarthritis, right knee: Secondary | ICD-10-CM | POA: Diagnosis not present

## 2022-10-27 DIAGNOSIS — L03115 Cellulitis of right lower limb: Secondary | ICD-10-CM | POA: Diagnosis not present

## 2022-10-27 DIAGNOSIS — Z7984 Long term (current) use of oral hypoglycemic drugs: Secondary | ICD-10-CM | POA: Insufficient documentation

## 2022-10-27 DIAGNOSIS — I129 Hypertensive chronic kidney disease with stage 1 through stage 4 chronic kidney disease, or unspecified chronic kidney disease: Secondary | ICD-10-CM | POA: Diagnosis not present

## 2022-10-27 DIAGNOSIS — Z794 Long term (current) use of insulin: Secondary | ICD-10-CM | POA: Insufficient documentation

## 2022-10-27 DIAGNOSIS — M7989 Other specified soft tissue disorders: Secondary | ICD-10-CM | POA: Diagnosis not present

## 2022-10-27 DIAGNOSIS — Z7951 Long term (current) use of inhaled steroids: Secondary | ICD-10-CM | POA: Insufficient documentation

## 2022-10-27 DIAGNOSIS — E1165 Type 2 diabetes mellitus with hyperglycemia: Secondary | ICD-10-CM | POA: Diagnosis not present

## 2022-10-27 DIAGNOSIS — L0390 Cellulitis, unspecified: Secondary | ICD-10-CM

## 2022-10-27 DIAGNOSIS — E11610 Type 2 diabetes mellitus with diabetic neuropathic arthropathy: Secondary | ICD-10-CM | POA: Diagnosis not present

## 2022-10-27 DIAGNOSIS — N189 Chronic kidney disease, unspecified: Secondary | ICD-10-CM | POA: Diagnosis not present

## 2022-10-27 LAB — BASIC METABOLIC PANEL
Anion gap: 11 (ref 5–15)
BUN: 34 mg/dL — ABNORMAL HIGH (ref 6–20)
CO2: 22 mmol/L (ref 22–32)
Calcium: 8.9 mg/dL (ref 8.9–10.3)
Chloride: 106 mmol/L (ref 98–111)
Creatinine, Ser: 1.48 mg/dL — ABNORMAL HIGH (ref 0.61–1.24)
GFR, Estimated: 54 mL/min — ABNORMAL LOW (ref >60)
Glucose, Bld: 138 mg/dL — ABNORMAL HIGH (ref 70–99)
Potassium: 4.5 mmol/L (ref 3.5–5.1)
Sodium: 139 mmol/L (ref 135–145)

## 2022-10-27 LAB — CBC
HCT: 42 % (ref 39.0–52.0)
Hemoglobin: 14.5 g/dL (ref 13.0–17.0)
MCH: 30 pg (ref 26.0–34.0)
MCHC: 34.5 g/dL (ref 30.0–36.0)
MCV: 87 fL (ref 80.0–100.0)
Platelets: 167 10*3/uL (ref 150–400)
RBC: 4.83 MIL/uL (ref 4.22–5.81)
RDW: 15.8 % — ABNORMAL HIGH (ref 11.5–15.5)
WBC: 9 10*3/uL (ref 4.0–10.5)
nRBC: 0 % (ref 0.0–0.2)

## 2022-10-27 LAB — LACTIC ACID, PLASMA: Lactic Acid, Venous: 1.5 mmol/L (ref 0.5–1.9)

## 2022-10-27 NOTE — ED Provider Triage Note (Signed)
Emergency Medicine Provider Triage Evaluation Note  Troy [name] , a 57 y.o. male  was evaluated in triage.  Pt complains of fluid collection over right knee since Saturday, sent by primary care due to concern for IV antibiotics, patient diabetic, history of left knee amputation, denies known injury.  He reports some swelling of the surrounding calf, he can bend the knee without too much difficulty but does endorse some pain.  Review of Systems  Positive: Fluid collection of knee Negative: Fever, chills  Physical Exam  There were no vitals taken for this visit. Gen:   Awake, no distress   Resp:  Normal effort  MSK:   Moves extremities without difficulty  Other:  Patient with 2cm fluid collection with fluctuance of right knee, no significant surrounding cellulitis on my exam  Medical Decision Making  Medically screening exam initiated at 8:16 PM.  Appropriate orders placed.  Troy [name] was informed that the remainder of the evaluation will be completed by another provider, this initial triage assessment does not replace that evaluation, and the importance of remaining in the ED until their evaluation is complete.  Workup initiated in triage    Anselmo Pickler, [place] 10/27/22 2020

## 2022-10-27 NOTE — ED Triage Notes (Signed)
Patient reports worsening right knee skin abscess with swelling/drainage onset Saturday , seen at a walk-in clinic today advised to go to ER for treatment .

## 2022-10-28 ENCOUNTER — Other Ambulatory Visit (HOSPITAL_COMMUNITY): Payer: Self-pay

## 2022-10-28 ENCOUNTER — Ambulatory Visit (HOSPITAL_BASED_OUTPATIENT_CLINIC_OR_DEPARTMENT_OTHER)
Admission: RE | Admit: 2022-10-28 | Discharge: 2022-10-28 | Disposition: A | Discharge status: Home / Self Care | Payer: Medicare HMO | Source: Ambulatory Visit | Attending: Physician Assistant | Admitting: Physician Assistant

## 2022-10-28 DIAGNOSIS — M7989 Other specified soft tissue disorders: Secondary | ICD-10-CM | POA: Insufficient documentation

## 2022-10-28 MED ORDER — DOXYCYCLINE HYCLATE 100 MG PO TABS
100.0000 mg | ORAL_TABLET | Freq: Once | ORAL | Status: AC
  Administered 2022-10-28: 100 mg via ORAL
  Filled 2022-10-28: qty 1

## 2022-10-28 MED ORDER — DOXYCYCLINE HYCLATE 100 MG PO CAPS
100.0000 mg | ORAL_CAPSULE | Freq: Two times a day (BID) | ORAL | 0 refills | Status: DC
  Filled 2022-10-28: qty 20, 10d supply, fill #0

## 2022-10-28 NOTE — ED Provider Notes (Signed)
[facility] Provider Note   CSN: [id number] Arrival date & time: 10/27/22  1953     History  Chief Complaint  Patient presents with   Skin Abscess    Troy [name] is a 57 y.o. male.  HPI 57 year old male with a history of hypertension, diabetic Charcot foot, DM type II, left leg amputation, CKD, pancreatitis, asthma, gout presents to the ER with complaints of a blister on his knee and swelling of his calf which has been ongoing for the last 3 days, onset Saturday.  Denies any falls or injuries.  He was reportedly sent by PCP for possible IV antibiotics given his history of diabetes.  He denies any fevers or chills.  He has noted that his right calf has been more swollen and warm to the touch.  He denies any difficulty ranging his knee and has been able to ambulate without difficulty.  Denies any history of IV drug use.  Denies any recent travel, no history of DVT.  He is not on anticoagulation.    Home Medications Prior to Admission medications   Medication Sig Start Date End Date Taking? Authorizing Provider  doxycycline (VIBRAMYCIN) 100 MG capsule Take 1 capsule (100 mg total) by mouth 2 (two) times daily. 10/28/22  Yes Garald Balding, PA-C  acetaminophen (TYLENOL) 500 MG tablet Take 1,000 mg by mouth every 8 (eight) hours as needed for moderate pain.    [provider]  albuterol (PROVENTIL HFA;VENTOLIN HFA) 108 (90 Base) MCG/ACT inhaler Inhale 1-2 puffs into the lungs every 6 (six) hours as needed for wheezing or shortness of breath. 08/25/15   Hyman Bible, PA-C  albuterol (PROVENTIL) (2.5 MG/3ML) 0.083% nebulizer solution USE 3 ML PER NEBULIZATION EVERY 4 HOURS AS NEEDED FOR WHEEZING 06/28/20 06/28/21  Katy Apo, NP  Alcohol Swabs (B-D SINGLE USE SWABS REGULAR) PADS  07/29/19   [provider]  allopurinol (ZYLOPRIM) 300 MG tablet Take 300 mg by mouth daily. 03/09/22   [provider]  atorvastatin  (LIPITOR) 10 MG tablet Take 10 mg by mouth daily. 03/06/17   [provider]  Blood Glucose Monitoring Suppl (ACCU-CHEK AVIVA PLUS) w/Device KIT USE TO TEST YOUR BLOOD SUGAR THREE TIMES A DAY 01/12/19   [provider]  budesonide-formoterol (SYMBICORT) 160-4.5 MCG/ACT inhaler Inhale 2 puffs into the lungs 2 (two) times daily.    [provider]  cephALEXin (KEFLEX) 500 MG capsule Take 1 capsule (500 mg total) by mouth 4 (four) times daily for 5 days 02/05/22   Lorenda Peck, DPM  Colchicine 0.6 MG CAPS Take 0.6 mg by mouth daily as needed (gout pain). 02/07/19   [provider]  dorzolamide-timolol (COSOPT) 22.3-6.8 MG/ML ophthalmic solution 1 drop 2 (two) times daily. 01/15/22   [provider]  DROPLET PEN NEEDLES 31G X 6 MM [place]  07/29/19   [provider]  fluticasone (FLONASE) 50 MCG/ACT nasal spray USE ONE SPRAY IN EACH NOSTRIL ONCE DAILY 30 60 Patient taking differently: Place 1 spray into both nostrils daily. 06/29/20 06/29/21  Seward Carol, MD  gabapentin (NEURONTIN) 300 MG capsule Take 300 mg by mouth at bedtime. 02/03/20   [provider]  glimepiride (AMARYL) 4 MG tablet Take 4 mg by mouth daily with breakfast.    [provider]  Insulin Glargine (BASAGLAR KWIKPEN) 100 UNIT/ML Inject 25 Units into the skin at bedtime.    [provider]  lisinopril (ZESTRIL) 20 MG tablet  01/14/22  [provider]  lisinopril-hydrochlorothiazide (PRINZIDE,ZESTORETIC) 10-12.5 MG tablet Take 1 tablet by mouth daily.  05/06/16   [provider]  metFORMIN (GLUCOPHAGE) 1000 MG tablet Take 1,000 mg by mouth 2 (two) times daily. 03/16/17   [provider]  MICROLET LANCETS MISC 1 each daily as needed. 02/16/17   [provider]  Multiple Vitamin (MULTIVITAMIN WITH MINERALS) TABS tablet Take 1 tablet by mouth daily.    [provider]  pantoprazole (PROTONIX) 40 MG tablet Take 40 mg by mouth  daily.  06/11/16   [provider]  sildenafil (VIAGRA) 100 MG tablet Take 100 mg by mouth as needed for erectile dysfunction. 01/04/19   [provider]  sucralfate (CARAFATE) 1 GM/10ML suspension Take 10 mLs (1 g total) by mouth 4 (four) times daily -  with meals and at bedtime. 06/15/22   Sherrell Puller, PA-C  tetrahydrozoline-zinc (VISINE-AC) 0.05-0.25 % ophthalmic solution Place 2 drops into both eyes 3 (three) times daily as needed (dry eyes).    [provider]  traMADol (ULTRAM) 50 MG tablet Take 1 tablet (50 mg total) by mouth every 6 (six) hours as needed (for pain). 02/06/22   Molpus, John, MD  TRUE METRIX BLOOD GLUCOSE TEST test strip 1 each 3 (three) times daily. 01/30/22   [provider]  Wound Dressings ([place] WOUND/BURN DRESSING) PSTE Apply to wound as directed 09/09/21   Lynden Oxford Scales, PA-C      Allergies    Patient has no known allergies.    Review of Systems   Review of Systems Ten systems reviewed and are negative for acute change, except as noted in the HPI.   Physical Exam Updated Vital Signs BP (!) 163/140 (BP Location: Right Arm)   Pulse 73   Temp 97.6 F (36.4 C) (Oral)   SpO2 90%  Physical Exam Vitals and nursing note reviewed.  Constitutional:      General: He is not in acute distress.    Appearance: He is well-developed.  HENT:     Head: Normocephalic and atraumatic.  Eyes:     Conjunctiva/sclera: Conjunctivae normal.  Cardiovascular:     Rate and Rhythm: Normal rate and regular rhythm.     Heart sounds: No murmur heard. Pulmonary:     Effort: Pulmonary effort is normal. No respiratory distress.     Breath sounds: Normal breath sounds.  Abdominal:     Palpations: Abdomen is soft.     Tenderness: There is no abdominal tenderness.  Musculoskeletal:        General: Swelling present. No tenderness or deformity.     Cervical back: Neck supple.     Comments: Right knee with 2 cm blister to the anterior  portion of the patella.  No significant joint effusion, able to flex and extend without difficulty.  No overlying erythema or warmth.  Right calf with mild warmth and swelling, DP pulses intact.  Skin:    General: Skin is warm and dry.     Capillary Refill: Capillary refill takes less than 2 seconds.  Neurological:     Mental Status: He is alert.  Psychiatric:        Mood and Affect: Mood normal.     ED Results / Procedures / Treatments   Labs (all labs ordered are listed, but only abnormal results are displayed) Labs Reviewed  CBC - Abnormal; Notable for the following components:      Result Value   RDW 15.8 (*)    All  other components within normal limits  BASIC METABOLIC PANEL - Abnormal; Notable for the following components:   Glucose, Bld 138 (*)    BUN 34 (*)    Creatinine, Ser 1.48 (*)    GFR, Estimated 54 (*)    All other components within normal limits  LACTIC ACID, PLASMA  LACTIC ACID, PLASMA    EKG None  Radiology DG Knee Complete 4 Views Right  Result Date: 10/27/2022 CLINICAL DATA:  Swelling EXAM: RIGHT KNEE - COMPLETE 4+ VIEW COMPARISON:  None Available. FINDINGS: No evidence of fracture, dislocation, or joint effusion. No bony destructive process identified. Medial compartment narrowing and osteophyte formation. Superior patellar spur. IMPRESSION: Degenerative changes.  No acute osseous abnormalities. Electronically Signed   By: Sammie Bench M.D.   On: 10/27/2022 20:50    Procedures Procedures    Medications Ordered in ED Medications  doxycycline (VIBRA-TABS) tablet 100 mg (has no administration in time range)    ED Course/ Medical Decision Making/ A&P                             Medical Decision Making 57 year old male presents to the ER with concerns for possible infected joint.  DDx includes cellulitis, DVT, septic joint, blister, he is afebrile, on exam, he has no difficulty ranging his knee and has a small blister appearing fluid collection  over the patella.  He does have some swelling and warmth to his right calf.  Lab work ordered, reviewed, CBC without leukocytosis, BMP with a slightly elevated creatinine of 1.48 but not far from baseline.  Lactic acid normal.  Right knee x-ray ordered in triage, reviewed, negative for effusion but no fractures.   Overall, very low suspicion for septic joint given that he is able to range his knee without difficulty, he is afebrile.  He has no evidence of trauma on x-ray.  He does have some mild swelling to the right lower calf with some warmth and erythema, could be cellulitis versus DVT.  Low suspicion for necrotizing infection, compartments are soft, low suspicion for compartment syndrome.  No falls or injuries to suggest tib-fib injury.  We will start him on doxycycline for cellulitis, and I will order a DVT study for which the patient will have to return for in the morning.  I encouraged PCP follow-up.  We discussed return precautions including worsening fevers, swelling, redness, inability to bend joint etc.  He voiced understanding and is agreeable.  Stable for discharge. Final Clinical Impression(s) / ED Diagnoses Final diagnoses:  Cellulitis, unspecified cellulitis site  Calf swelling    Rx / DC Orders ED Discharge Orders          Ordered    LE VENOUS        10/28/22 0330    doxycycline (VIBRAMYCIN) 100 MG capsule  2 times daily        10/28/22 0330              Garald Balding, PA-C 10/28/22 [id number]    Merryl Hacker, MD 10/30/22 [phone]

## 2022-10-28 NOTE — Progress Notes (Signed)
Right lower extremity venous duplex has been completed. Preliminary results can be found in CV Proc through chart review.   10/28/22 11:06 AM Troy [name] RVT

## 2022-10-28 NOTE — Discharge Instructions (Signed)
You were evaluated in the Emergency Department and after careful evaluation, we did not find any emergent condition requiring admission or further testing in the hospital.  You may have an infection of your skin called cellulitis, please start taking the antibiotics as directed until finished.  Please make sure to return to the ER if you have any worsening redness, swelling, fevers, chills, and ability to bend your right knee, etc.  We will need to rule out a blood clot in your calf, please follow the instructions on your discharge paperwork and go to the directed location at 11 AM.  You will need to follow-up with your primary care doctor to make sure that your antibiotics are working.  Thank you for allowing [place] to be a part of your care.

## 2022-11-11 ENCOUNTER — Ambulatory Visit (INDEPENDENT_AMBULATORY_CARE_PROVIDER_SITE_OTHER): Payer: Medicare HMO | Admitting: Podiatry

## 2022-11-11 DIAGNOSIS — B351 Tinea unguium: Secondary | ICD-10-CM | POA: Diagnosis not present

## 2022-11-11 DIAGNOSIS — M79676 Pain in unspecified toe(s): Secondary | ICD-10-CM | POA: Diagnosis not present

## 2022-11-11 DIAGNOSIS — D2371 Other benign neoplasm of skin of right lower limb, including hip: Secondary | ICD-10-CM

## 2022-11-11 DIAGNOSIS — R6889 Other general symptoms and signs: Secondary | ICD-10-CM | POA: Diagnosis not present

## 2022-11-11 DIAGNOSIS — E1142 Type 2 diabetes mellitus with diabetic polyneuropathy: Secondary | ICD-10-CM

## 2022-11-11 NOTE — Progress Notes (Signed)
He presents today for follow-up of his painful calluses to the plantar aspect of his right foot.  He is also complaining of painful elongated nails states that he was tested just recently for a blood clot in the right leg had cellulitis in his knee and thigh.  He states is doing much better now denies fever chills nausea vomiting muscle aches pains back pain chest pain shortness of breath.  Objective: Vital signs are stable alert oriented x 3.  Pulses are palpable.  He does have some mild pitting edema to the leg and the dorsal lateral foot.  Toenails are slightly elongated painful callus subsecond metatarsophalangeal joint with contracted digital deformities present.  No open lesions or wounds.  Also has reactive hyper keratoma to the medial aspect of the first metatarsophalangeal joint currently not open.  Assessment: Pain in limb secondary to onychomycosis and benign skin lesions diabetes with diabetic peripheral neuropathy history of amputation left leg.  Plan: Discussed etiology pathology and surgical therapies debrided all reactive hyperkeratotic tissue details with through 5 bilateral.

## 2022-11-12 DIAGNOSIS — R1013 Epigastric pain: Secondary | ICD-10-CM | POA: Diagnosis not present

## 2022-11-12 DIAGNOSIS — R6889 Other general symptoms and signs: Secondary | ICD-10-CM | POA: Diagnosis not present

## 2022-11-12 DIAGNOSIS — R748 Abnormal levels of other serum enzymes: Secondary | ICD-10-CM | POA: Diagnosis not present

## 2022-12-04 DIAGNOSIS — R1013 Epigastric pain: Secondary | ICD-10-CM | POA: Diagnosis not present

## 2022-12-04 DIAGNOSIS — K2100 Gastro-esophageal reflux disease with esophagitis, without bleeding: Secondary | ICD-10-CM | POA: Diagnosis not present

## 2022-12-04 DIAGNOSIS — B3781 Candidal esophagitis: Secondary | ICD-10-CM | POA: Diagnosis not present

## 2022-12-04 DIAGNOSIS — K2970 Gastritis, unspecified, without bleeding: Secondary | ICD-10-CM | POA: Diagnosis not present

## 2022-12-08 DIAGNOSIS — K2100 Gastro-esophageal reflux disease with esophagitis, without bleeding: Secondary | ICD-10-CM | POA: Diagnosis not present

## 2022-12-08 DIAGNOSIS — K2970 Gastritis, unspecified, without bleeding: Secondary | ICD-10-CM | POA: Diagnosis not present

## 2022-12-08 DIAGNOSIS — B3781 Candidal esophagitis: Secondary | ICD-10-CM | POA: Diagnosis not present

## 2022-12-25 ENCOUNTER — Ambulatory Visit: Payer: Medicare HMO | Admitting: Podiatry

## 2022-12-25 ENCOUNTER — Encounter: Payer: Self-pay | Admitting: Podiatry

## 2022-12-25 DIAGNOSIS — B351 Tinea unguium: Secondary | ICD-10-CM

## 2022-12-25 DIAGNOSIS — M79676 Pain in unspecified toe(s): Secondary | ICD-10-CM

## 2022-12-25 DIAGNOSIS — D2371 Other benign neoplasm of skin of right lower limb, including hip: Secondary | ICD-10-CM

## 2022-12-27 NOTE — Progress Notes (Signed)
He presents today chief complaint of painful elongated toenails and calluses plantar aspect of the forefoot right.  History of below amputation left.  States that he seems to be doing pretty well at this point.  Objective: Vital signs are stable oriented x 3.  Pulses are palpable.  Toenails are long thick yellow dystrophic with mycotic reactive hyper keratoma to the plantar aspect of the second metatarsal area of the right foot.  He does also have a superficial abrasion which is gone on to heal to the bunion deformity of the right foot.  Assessment: No abuse or wounds diabetes with peripheral neuropathy angiopathy history of amputation left.  Benign skin lesion and painful elongated toenails.  Plan: Debrided toenails debrided benign skin lesions.  Follow-up with him in about 6 weeks or so.

## 2023-01-12 DIAGNOSIS — F3341 Major depressive disorder, recurrent, in partial remission: Secondary | ICD-10-CM | POA: Diagnosis not present

## 2023-01-12 DIAGNOSIS — M1A9XX1 Chronic gout, unspecified, with tophus (tophi): Secondary | ICD-10-CM | POA: Diagnosis not present

## 2023-01-12 DIAGNOSIS — E11621 Type 2 diabetes mellitus with foot ulcer: Secondary | ICD-10-CM | POA: Diagnosis not present

## 2023-01-12 DIAGNOSIS — Z125 Encounter for screening for malignant neoplasm of prostate: Secondary | ICD-10-CM | POA: Diagnosis not present

## 2023-01-12 DIAGNOSIS — Z0000 Encounter for general adult medical examination without abnormal findings: Secondary | ICD-10-CM | POA: Diagnosis not present

## 2023-01-12 DIAGNOSIS — G4730 Sleep apnea, unspecified: Secondary | ICD-10-CM | POA: Diagnosis not present

## 2023-01-12 DIAGNOSIS — I10 Essential (primary) hypertension: Secondary | ICD-10-CM | POA: Diagnosis not present

## 2023-01-12 DIAGNOSIS — E1151 Type 2 diabetes mellitus with diabetic peripheral angiopathy without gangrene: Secondary | ICD-10-CM | POA: Diagnosis not present

## 2023-01-12 DIAGNOSIS — R6889 Other general symptoms and signs: Secondary | ICD-10-CM | POA: Diagnosis not present

## 2023-01-12 DIAGNOSIS — E7800 Pure hypercholesterolemia, unspecified: Secondary | ICD-10-CM | POA: Diagnosis not present

## 2023-01-12 DIAGNOSIS — Z794 Long term (current) use of insulin: Secondary | ICD-10-CM | POA: Diagnosis not present

## 2023-01-27 DIAGNOSIS — Z0100 Encounter for examination of eyes and vision without abnormal findings: Secondary | ICD-10-CM | POA: Diagnosis not present

## 2023-01-27 DIAGNOSIS — E113513 Type 2 diabetes mellitus with proliferative diabetic retinopathy with macular edema, bilateral: Secondary | ICD-10-CM | POA: Diagnosis not present

## 2023-01-27 DIAGNOSIS — H40053 Ocular hypertension, bilateral: Secondary | ICD-10-CM | POA: Diagnosis not present

## 2023-01-27 DIAGNOSIS — H3582 Retinal ischemia: Secondary | ICD-10-CM | POA: Diagnosis not present

## 2023-01-27 DIAGNOSIS — Z961 Presence of intraocular lens: Secondary | ICD-10-CM | POA: Diagnosis not present

## 2023-02-12 ENCOUNTER — Ambulatory Visit: Payer: Medicare HMO | Admitting: Podiatry

## 2023-02-12 ENCOUNTER — Encounter: Payer: Self-pay | Admitting: Podiatry

## 2023-02-12 DIAGNOSIS — D2371 Other benign neoplasm of skin of right lower limb, including hip: Secondary | ICD-10-CM

## 2023-02-12 DIAGNOSIS — R6889 Other general symptoms and signs: Secondary | ICD-10-CM | POA: Diagnosis not present

## 2023-02-12 DIAGNOSIS — B351 Tinea unguium: Secondary | ICD-10-CM | POA: Diagnosis not present

## 2023-02-12 DIAGNOSIS — M79676 Pain in unspecified toe(s): Secondary | ICD-10-CM | POA: Diagnosis not present

## 2023-02-12 DIAGNOSIS — E1142 Type 2 diabetes mellitus with diabetic polyneuropathy: Secondary | ICD-10-CM

## 2023-02-12 NOTE — Progress Notes (Signed)
He presents today for a follow-up of his diabetes and his footcare.  States that he has callus on the plantar aspect of the right foot that is somewhat tender as well as painful elongated nails on the right foot.  Has a history of a below-knee amputation on the left leg.  Objective: Pulses are minimally palpable dorsal and posterior tibial tendon neurologic sensorium is diminished.  Toenails are long thick yellow dystrophic onychomycotic is a reactive hyperkeratotic lesion benign subsecond metatarsal phalangeal joint.  Assessment: Pain in limb secondary to diabetes onychomycosis and benign skin lesion.  Plan: Debridement of benign skin lesion debridement of nails.  Follow-up with him in 6 weeks

## 2023-02-25 DIAGNOSIS — H40052 Ocular hypertension, left eye: Secondary | ICD-10-CM | POA: Diagnosis not present

## 2023-02-25 DIAGNOSIS — R6889 Other general symptoms and signs: Secondary | ICD-10-CM | POA: Diagnosis not present

## 2023-03-08 DIAGNOSIS — L03115 Cellulitis of right lower limb: Secondary | ICD-10-CM | POA: Diagnosis not present

## 2023-03-08 DIAGNOSIS — L02429 Furuncle of limb, unspecified: Secondary | ICD-10-CM | POA: Diagnosis not present

## 2023-03-08 DIAGNOSIS — I10 Essential (primary) hypertension: Secondary | ICD-10-CM | POA: Diagnosis not present

## 2023-03-08 DIAGNOSIS — E1169 Type 2 diabetes mellitus with other specified complication: Secondary | ICD-10-CM | POA: Diagnosis not present

## 2023-03-11 ENCOUNTER — Ambulatory Visit (HOSPITAL_BASED_OUTPATIENT_CLINIC_OR_DEPARTMENT_OTHER)
Admission: RE | Admit: 2023-03-11 | Discharge: 2023-03-11 | Disposition: A | Discharge status: Home / Self Care | Payer: Medicare HMO | Source: Ambulatory Visit | Attending: Vascular Surgery | Admitting: Vascular Surgery

## 2023-03-11 ENCOUNTER — Other Ambulatory Visit (HOSPITAL_COMMUNITY): Payer: Self-pay

## 2023-03-11 ENCOUNTER — Other Ambulatory Visit (HOSPITAL_COMMUNITY): Payer: Self-pay | Admitting: Internal Medicine

## 2023-03-11 ENCOUNTER — Encounter (HOSPITAL_COMMUNITY): Payer: Self-pay

## 2023-03-11 ENCOUNTER — Ambulatory Visit (HOSPITAL_COMMUNITY)
Admission: RE | Admit: 2023-03-11 | Discharge: 2023-03-11 | Disposition: A | Discharge status: Home / Self Care | Payer: Medicare HMO | Source: Ambulatory Visit | Attending: Vascular Surgery | Admitting: Vascular Surgery

## 2023-03-11 VITALS — BP 113/70 | HR 83

## 2023-03-11 DIAGNOSIS — R609 Edema, unspecified: Secondary | ICD-10-CM | POA: Diagnosis not present

## 2023-03-11 DIAGNOSIS — I82441 Acute embolism and thrombosis of right tibial vein: Secondary | ICD-10-CM

## 2023-03-11 DIAGNOSIS — I10 Essential (primary) hypertension: Secondary | ICD-10-CM | POA: Diagnosis not present

## 2023-03-11 DIAGNOSIS — E1151 Type 2 diabetes mellitus with diabetic peripheral angiopathy without gangrene: Secondary | ICD-10-CM | POA: Diagnosis not present

## 2023-03-11 DIAGNOSIS — Z794 Long term (current) use of insulin: Secondary | ICD-10-CM | POA: Diagnosis not present

## 2023-03-11 DIAGNOSIS — M7989 Other specified soft tissue disorders: Secondary | ICD-10-CM | POA: Diagnosis not present

## 2023-03-11 DIAGNOSIS — L0291 Cutaneous abscess, unspecified: Secondary | ICD-10-CM | POA: Diagnosis not present

## 2023-03-11 MED ORDER — APIXABAN (ELIQUIS) VTE STARTER PACK (10MG AND 5MG)
ORAL_TABLET | ORAL | 0 refills | Status: DC
  Filled 2023-03-11: qty 74, 30d supply, fill #0

## 2023-03-11 MED ORDER — APIXABAN 5 MG PO TABS: 5.0000 mg | ORAL_TABLET | Freq: Two times a day (BID) | ORAL | 1 refills | Status: AC

## 2023-03-11 NOTE — Progress Notes (Addendum)
DVT Clinic Note  Name: Troy [name]     MRN: [id number]     DOB: [birth year]     Sex: male  PCP: Renford Dills, MD  Today's Visit: Visit Information: Initial Visit  Referred to DVT Clinic by: Dr. Nehemiah Settle [facility])  Referred to CPP by: Dr. Karin Lieu Reason for referral:  Chief Complaint  Patient presents with   DVT   HISTORY OF PRESENT ILLNESS: Troy [name] is a 57 y.o. male with PMH T2DM, HTN, asthma, CKD, HLD, gout, s/p L below knee amputation, who presents after diagnosis of DVT for medication management. Patient reports that he began to have pain and swelling in his calf last Thursday which he thought was related to a boil on his calf. The pain and swelling worsened and he presented to urgent care Saturday. They performed I&D and prescribed antibiotics. His pain improved but the swelling persisted and redness worsened. His leg became very shiny. He saw his PCP today who was concerned for DVT. Ultrasound today showed acute DVT involving the right posterior tibial veins. Patient reports that about 1 month ago he tripped and fell, landing on and injuring his right leg. Denies other recent injury or illness other than boil which is still draining s/p I&D on 03/08/23. He endorses decreased mobility recently. Says he sits for 8-9 hours per day though not all at once. No history of DVT.   Positive Thrombotic Risk Factors: Recent trauma (within 3 months), Obesity, Other (comment) (recent decreased mobility) Bleeding Risk Factors: None Present  Negative Thrombotic Risk Factors: Previous VTE, Recent surgery (within 3 months), Recent admission to hospital with acute illness (within 3 months), Recent COVID diagnosis (within 3 months), Paralysis, paresis, or recent plaster cast immobilization of lower extremity, Central venous catheterization, Bed rest >72 hours within 3 months, Sedentary journey lasting >8 hours within 4 weeks, Pregnancy, Within 6 weeks postpartum, Recent cesarean  section (within 3 months), Estrogen therapy, Testosterone therapy, Erythropoiesis-stimulating agent, Active cancer, Non-malignant, chronic inflammatory condition, Known thrombophilic condition, Smoking, Older age  Rx Insurance Coverage: Medicare Rx Affordability: Eliquis is $45/month on his insurance, which he says is affordable if short-term. Filled for $0 today using one time free trial card.  Preferred Pharmacy: Starter pack filled today at [facility] [address]   Past Medical History:  Diagnosis Date   Asthma    Bronchitis    CKD (chronic kidney disease)    Diabetes mellitus    Diabetic Charcot foot (HCC)    Left   Gout    Hypertension    Kidney stones 11/30/2013   Neuromuscular disorder (HCC)    Obesity    Pancreatitis    Polyneuropathy in diabetes [facility])    Sleep apnea     Past Surgical History:  Procedure Laterality Date   AMPUTATION Left 02/06/2016   Procedure: AMPUTATION BELOW KNEE;  Surgeon: Eldred Manges, MD;  Location: [facility] OR;  Service: Orthopedics;  Laterality: Left;   CHOLECYSTECTOMY N/A 12/03/2013   Procedure: LAPAROSCOPIC CHOLECYSTECTOMY WITH INTRAOPERATIVE CHOLANGIOGRAM;  Surgeon: Emelia Loron, MD;  Location: [facility] OR;  Service: General;  Laterality: N/A;   COLONOSCOPY WITH PROPOFOL N/A 09/01/2017   Procedure: COLONOSCOPY WITH PROPOFOL;  Surgeon: Charlott Rakes, MD;  Location: [facility] ENDOSCOPY;  Service: Endoscopy;  Laterality: N/A;   COLONOSCOPY WITH PROPOFOL N/A 04/23/2021   Procedure: COLONOSCOPY WITH PROPOFOL;  Surgeon: Charlott Rakes, MD;  Location: [facility]OSCOPY;  Service: Endoscopy;  Laterality: N/A;   ESOPHAGOGASTRODUODENOSCOPY (EGD)  WITH PROPOFOL N/A 09/01/2017   Procedure: ESOPHAGOGASTRODUODENOSCOPY (EGD) WITH PROPOFOL;  Surgeon: Charlott Rakes, MD;  Location: [facility] ENDOSCOPY;  Service: Endoscopy;  Laterality: N/A;   NO PAST SURGERIES     POLYPECTOMY  04/23/2021   Procedure: POLYPECTOMY;  Surgeon: Charlott Rakes, MD;   Location: [facility] ENDOSCOPY;  Service: Endoscopy;;    Social History   Socioeconomic History   Marital status: Married    Spouse name: Not on file   Number of children: Not on file   Years of education: Not on file   Highest education level: Not on file  Occupational History   Not on file  Tobacco Use   Smoking status: Never   Smokeless tobacco: Never  Substance and Sexual Activity   Alcohol use: Yes    Comment: occasionally   Drug use: No   Sexual activity: Not on file  Other Topics Concern   Not on file  Social History Narrative   Not on file   Social Determinants of Health   Financial Resource Strain: Not on file  Food Insecurity: Not on file  Transportation Needs: No Transportation Needs (02/20/2022)   PRAPARE - Transportation    Lack of Transportation (Medical): No    Lack of Transportation (Non-Medical): No  Physical Activity: Not on file  Stress: No Stress Concern Present (02/20/2022)   [employer] of Occupational Health - Occupational Stress Questionnaire    Feeling of Stress : Not at all  Social Connections: Not on file  Intimate Partner Violence: Not on file    Family History  Problem Relation Age of Onset   Diabetes Mellitus II Father    Diabetes Father    Hypertension Father    Diabetes Mother    Hypertension Mother    CAD Neg Hx     Allergies as of 03/11/2023   (No Known Allergies)    Current Outpatient Medications on File Prior to Encounter  Medication Sig Dispense Refill   acetaminophen (TYLENOL) 500 MG tablet Take 1,000 mg by mouth every 8 (eight) hours as needed for moderate pain.     albuterol (PROVENTIL HFA;VENTOLIN HFA) 108 (90 Base) MCG/ACT inhaler Inhale 1-2 puffs into the lungs every 6 (six) hours as needed for wheezing or shortness of breath. 1 Inhaler 0   allopurinol (ZYLOPRIM) 300 MG tablet Take 300 mg by mouth daily.     atorvastatin (LIPITOR) 10 MG tablet Take 10 mg by mouth daily.  10   Blood Glucose Monitoring Suppl (TRUE METRIX  METER) w/Device KIT as directed to check blood sugar twice a day     budesonide-formoterol (SYMBICORT) 160-4.5 MCG/ACT inhaler Inhale 2 puffs into the lungs 2 (two) times daily.     cetirizine (ZYRTEC) 10 MG tablet Take 10 mg by mouth daily.     clindamycin (CLEOCIN) 300 MG capsule Take 300 mg by mouth 3 (three) times daily. For 10 days     Colchicine 0.6 MG CAPS Take 0.6 mg by mouth daily as needed (gout pain).     dapagliflozin propanediol (FARXIGA) 10 MG TABS tablet Take 10 mg by mouth daily.     dorzolamide-timolol (COSOPT) 22.3-6.8 MG/ML ophthalmic solution 1 drop 2 (two) times daily.     fluticasone (FLONASE) 50 MCG/ACT nasal spray USE ONE SPRAY IN EACH NOSTRIL ONCE DAILY 30 60 (Patient taking differently: Place 1 spray into both nostrils daily.) 16 g 5   gabapentin (NEURONTIN) 300 MG capsule Take 300 mg by mouth at bedtime.     Insulin Glargine (BASAGLAR  KWIKPEN) 100 UNIT/ML Inject 25 Units into the skin daily.     lisinopril-hydrochlorothiazide (PRINZIDE,ZESTORETIC) 10-12.5 MG tablet Take 1 tablet by mouth daily.   2   metFORMIN (GLUCOPHAGE) 1000 MG tablet Take 1,000 mg by mouth 2 (two) times daily.  10   Multiple Vitamin (MULTIVITAMIN WITH MINERALS) TABS tablet Take 1 tablet by mouth daily.     mupirocin ointment (BACTROBAN) 2 % Apply 1 Application topically 2 (two) times daily.     pantoprazole (PROTONIX) 40 MG tablet Take 40 mg by mouth daily.   0   sildenafil (VIAGRA) 100 MG tablet Take 100 mg by mouth as needed for erectile dysfunction.     tetrahydrozoline-zinc (VISINE-AC) 0.05-0.25 % ophthalmic solution Place 2 drops into both eyes 3 (three) times daily as needed (dry eyes).     traMADol (ULTRAM) 50 MG tablet Take 1 tablet (50 mg total) by mouth every 6 (six) hours as needed (for pain). 30 tablet 1   Alcohol Swabs (B-D SINGLE USE SWABS REGULAR) PADS      Blood Glucose Calibration (TRUE METRIX LEVEL 1) Low SOLN      DROPLET PEN NEEDLES 31G X 6 MM MISC      MICROLET LANCETS MISC 1  each daily as needed.  5   TRUE METRIX BLOOD GLUCOSE TEST test strip 1 each 3 (three) times daily.     No current facility-administered medications on file prior to encounter.   REVIEW OF SYSTEMS:  Review of Systems  Respiratory:  Negative for shortness of breath.   Cardiovascular:  Positive for leg swelling. Negative for chest pain and palpitations.  Musculoskeletal:  Positive for myalgias.  Neurological:  Negative for dizziness and tingling.   PHYSICAL EXAMINATION:  Vitals:   03/11/23 1313  BP: 113/70  Pulse: 83  SpO2: 99%   Physical Exam Vitals reviewed.  Cardiovascular:     Rate and Rhythm: Normal rate.  Pulmonary:     Effort: Pulmonary effort is normal.  Musculoskeletal:        General: Tenderness present.     Right lower leg: Edema present.     Left lower leg: No edema.  Skin:    Findings: Erythema present. No bruising.     Comments: Boil on R lateral calf is bandaged but draining purulent and serosanguinous fluid  Psychiatric:        Mood and Affect: Mood normal.        Behavior: Behavior normal.        Thought Content: Thought content normal.   Villalta Score for Post-Thrombotic Syndrome: Pain: Mild Cramps: Absent Heaviness: Mild Paresthesia: Mild Pruritus: Absent Pretibial Edema: Moderate Skin Induration: Absent Hyperpigmentation: Absent Redness: Mild Venous Ectasia: Absent Pain on calf compression: Mild Villalta Preliminary Score: 7 Is venous ulcer present?: No If venous ulcer is present and score is <15, then 15 points total are assigned: Absent Villalta Total Score: 7  LABS:  CBC     Component Value Date/Time   WBC 9.0 10/27/2022 2028   RBC 4.83 10/27/2022 2028   HGB 14.5 10/27/2022 2028   HCT 42.0 10/27/2022 2028   PLT 167 10/27/2022 2028   MCV 87.0 10/27/2022 2028   MCH 30.0 10/27/2022 2028   MCHC 34.5 10/27/2022 2028   RDW 15.8 (H) 10/27/2022 2028   LYMPHSABS 3.0 06/15/2022 1209   MONOABS 0.4 06/15/2022 1209   EOSABS 0.3 06/15/2022  1209   BASOSABS 0.0 06/15/2022 1209    Hepatic Function      Component Value Date/Time  PROT 7.2 10/19/2022 1933   ALBUMIN 3.8 10/19/2022 1933   AST 28 10/19/2022 1933   ALT 33 10/19/2022 1933   ALKPHOS 60 10/19/2022 1933   BILITOT 0.6 10/19/2022 1933   BILIDIR 0.2 04/10/2017 1003   IBILI 0.5 04/10/2017 1003    Renal Function   Lab Results  Component Value Date   CREATININE 1.48 (H) 10/27/2022   CREATININE 1.33 (H) 10/19/2022   CREATININE 1.19 06/15/2022    CrCl cannot be calculated (Patient's most recent lab result is older than the maximum 21 days allowed.).   Labs current from 01/12/23 in [facility]: Hgb 15.5, Scr 1.26, eGFR 66, AST 17, ALT 24. CBC rechecked today 03/11/23 at PCP visit and has not yet resulted.   [facility] Vascular Lab Studies:  03/11/23 VAS [place] LOWER EXTREMITY VENOUS (DVT) RIGHT Summary:  RIGHT:  - Findings consistent with acute deep vein thrombosis involving the right  posterior tibial veins.  - No cystic structure found in the popliteal fossa.    LEFT:  - No evidence of common femoral vein obstruction.   ASSESSMENT: Location of DVT: Right distal vein Cause of DVT: provoked by a transient risk factor. Multiple recent risk factors present for VTE including decreased mobility, recent fall onto his right leg, and obesity. Unclear the role that the boil and I&D have played in this since his symptoms began at the same time or immediately after this. Of note, he presented to the ED 10/27/22 with calf swelling and cellulitis and DVT study at that time was negative. Given that this is an isolated distal DVT that appears to be provoked, will plan to treat for 3 months with anticoagulation. If he were to develop a DVT in the future, long-term anticoagulation would be indicated at that time. Labs are appropriate for Eliquis start. This was filled during his visit today, and he was counseled extensively. We discussed that he needs to increase his physical activity and decrease extended  periods of time that he is sitting.   PLAN: -Start apixaban (Eliquis) 10 mg twice daily for 7 days followed by 5 mg twice daily. -Expected duration of therapy: 3 months. Therapy started on 03/11/23. -Patient educated on purpose, proper use and potential adverse effects of apixaban (Eliquis). -Discussed importance of taking medication around the same time every day. -Advised patient of medications to avoid (NSAIDs, aspirin doses >100 mg daily). -Educated that Tylenol (acetaminophen) is the preferred analgesic to lower the risk of bleeding. -Advised patient to alert all providers of anticoagulation therapy prior to starting a new medication or having a procedure. -Emphasized importance of monitoring for signs and symptoms of bleeding (abnormal bruising, prolonged bleeding, nose bleeds, bleeding from gums, discolored urine, black tarry stools). -Educated patient to present to the ED if emergent signs and symptoms of new thrombosis occur. -Counseled patient to wear compression stockings daily, removing at night. Educated patient how to elevate legs to help with swelling.   Follow up: 1 month in DVT Clinic.   Pervis Hocking, PharmD, [place], CPP Deep Vein Thrombosis Clinic Clinical Pharmacist Practitioner Office: [phone]  I have evaluated the patient's chart/imaging and refer this patient to the Clinical Pharmacist Practitioner for medication management. I have reviewed the CPP's documentation and agree with her assessment and plan. I was immediately available during the visit for questions and collaboration.   Victorino Sparrow, MD

## 2023-03-11 NOTE — Patient Instructions (Signed)
-  Start apixaban (Eliquis) 10 mg twice daily for 7 days followed by 5 mg twice daily. -[facility] You may need to call the pharmacy to ask them to fill this when you start to run low on your current supply.  -It is important to take your medication around the same time every day.  -Avoid NSAIDs like ibuprofen (Advil, Motrin) and naproxen (Aleve) as well as aspirin doses over 100 mg daily. -Tylenol (acetaminophen) is the preferred over the counter pain medication to lower the risk of bleeding. -Be sure to alert all of your health care providers that you are taking an anticoagulant prior to starting a new medication or having a procedure. -Monitor for signs and symptoms of bleeding (abnormal bruising, prolonged bleeding, nose bleeds, bleeding from gums, discolored urine, black tarry stools). If you have fallen and hit your head OR if your bleeding is severe or not stopping, seek emergency care.  -Go to the emergency room if emergent signs and symptoms of new clot occur (new or worse swelling and pain in an arm or leg, shortness of breath, chest pain, fast or irregular heartbeats, lightheadedness, dizziness, fainting, coughing up blood) or if you experience a significant color change (pale or blue) in the extremity that has the DVT.  -We recommend you wear compression stockings as long as you are having swelling or pain. Be sure to purchase the correct size and take them off at night. Elevate your legs daily.   Your next visit is on Monday September 16th at [facility] [address] [place], [place], [place] [address] Enter the hospital through Entrance C off [facility] and pull up to the [facility] entrance to the free valet parking.  Check in for your appointment at the [facility] V[facility] Center.   If you have any questions or need to reschedule an appointment, please call [phone] [facility].  If you are having an emergency,  call 911 or present to the nearest emergency room.   What is a DVT?  -Deep vein thrombosis (DVT) is a condition in which a blood clot forms in a vein of the deep venous system which can occur in the lower leg, thigh, pelvis, arm, or neck. This condition is serious and can be life-threatening if the clot travels to the arteries of the lungs and causing a blockage (pulmonary embolism, PE). A DVT can also damage veins in the leg, which can lead to long-term venous disease, leg pain, swelling, discoloration, and ulcers or sores (post-thrombotic syndrome).  -Treatment may include taking an anticoagulant medication to prevent more clots from forming and the current clot from growing, wearing compression stockings, and/or surgical procedures to remove or dissolve the clot.

## 2023-03-26 ENCOUNTER — Ambulatory Visit: Payer: Medicare HMO | Admitting: Podiatry

## 2023-03-26 DIAGNOSIS — D2371 Other benign neoplasm of skin of right lower limb, including hip: Secondary | ICD-10-CM

## 2023-03-26 DIAGNOSIS — B351 Tinea unguium: Secondary | ICD-10-CM

## 2023-03-26 DIAGNOSIS — M79676 Pain in unspecified toe(s): Secondary | ICD-10-CM

## 2023-03-26 DIAGNOSIS — E1142 Type 2 diabetes mellitus with diabetic polyneuropathy: Secondary | ICD-10-CM | POA: Diagnosis not present

## 2023-03-26 DIAGNOSIS — R6889 Other general symptoms and signs: Secondary | ICD-10-CM | POA: Diagnosis not present

## 2023-03-26 NOTE — Progress Notes (Signed)
And presents today for follow-up of his reactive hyper keratoma plantar aspect of his right foot.  Also his painful nails.  He goes on to say that he has recently been diagnosed with a blood clot of the small vein in his medial ankle and they did put him on Eliquis.  He also had a boil lanced on the lateral aspect of his superior lateral leg.  Objective: Vital signs are stable alert oriented x 3 pulses are nonpalpable right but the foot is warm to touch capillary fill time is immediate.  No open lesions or wounds.  He has slightly elongated nails 1 through 5 of the right foot history of amputation below knee left foot painful benign skin lesion right foot.  Assessment: Pain in limb secondary to foot deformity and benign skin lesion as well as elongated toenails 1 through 5 right.  Plan: Debridement of toenails 1 through 5 right debridement of benign skin lesion.  Follow-up with me as needed.

## 2023-04-06 ENCOUNTER — Other Ambulatory Visit (HOSPITAL_COMMUNITY): Payer: Self-pay

## 2023-04-08 DIAGNOSIS — H40053 Ocular hypertension, bilateral: Secondary | ICD-10-CM | POA: Diagnosis not present

## 2023-04-13 ENCOUNTER — Encounter (HOSPITAL_COMMUNITY): Payer: Self-pay

## 2023-04-13 ENCOUNTER — Ambulatory Visit (HOSPITAL_COMMUNITY)
Admission: RE | Admit: 2023-04-13 | Discharge: 2023-04-13 | Disposition: A | Discharge status: Home / Self Care | Payer: Medicare HMO | Source: Ambulatory Visit | Attending: Vascular Surgery | Admitting: Vascular Surgery

## 2023-04-13 VITALS — BP 142/83 | HR 77

## 2023-04-13 DIAGNOSIS — I82441 Acute embolism and thrombosis of right tibial vein: Secondary | ICD-10-CM | POA: Insufficient documentation

## 2023-04-13 DIAGNOSIS — R6889 Other general symptoms and signs: Secondary | ICD-10-CM | POA: Diagnosis not present

## 2023-04-13 NOTE — Progress Notes (Signed)
DVT Clinic Note  Name: Troy [name]     MRN: [id number]     DOB: 07/28/1966     Sex: male  PCP: Renford Dills, MD  Today's Visit: Visit Information: Follow Up Visit  Referred to DVT Clinic by: Primary Care - Dr. Nehemiah Settle [facility]) Referred to CPP by: Dr. Randie Heinz Reason for referral:  Chief Complaint  Patient presents with   Med Management - DVT   HISTORY OF PRESENT ILLNESS: Troy [name] is a 57 y.o. male with PMH T2DM, HTN, asthma, CKD, HLD, gout, s/p L below knee amputation, who presents for follow up medication management. He was diagnosed with an acute DVT involving the right posterior tibial veins on 03/11/23, and he was seen in the DVT Clinic at that time and started on Eliquis. In the month prior to DVT diagnosis, patient endorses a fall with injury to the right leg. He developed pain and swelling but initially thought this was related to a boil on his leg. When the pain and swelling worsened despite I&D of the boil and antibiotics, his PCP was concerned for DVT. At his last visit, he also endorsed decreased mobility recently, sitting for 8-9 hours per day but not all at once. He has no prior history of VTE.   Today patient reports his leg is feeling much better. The boil on his leg resolved. Denies abnormal bleeding or bruising. Denies missed doses of Eliquis. He has not been wearing compression stockings. He feels like elevation does not help his swelling much. He has had no issues getting Eliquis from his pharmacy.   Positive Thrombotic Risk Factors: Recent trauma (within 3 months), Obesity, Other (comment) (recent decreased mobility) Bleeding Risk Factors: Anticoagulant therapy  Negative Thrombotic Risk Factors: Previous VTE, Recent surgery (within 3 months), Recent admission to hospital with acute illness (within 3 months), Paralysis, paresis, or recent plaster cast immobilization of lower extremity, Central venous catheterization, Bed rest >72 hours within 3  months, Sedentary journey lasting >8 hours within 4 weeks, Pregnancy, Within 6 weeks postpartum, Recent cesarean section (within 3 months), Estrogen therapy, Testosterone therapy, Erythropoiesis-stimulating agent, Recent COVID diagnosis (within 3 months), Active cancer, Non-malignant, chronic inflammatory condition, Known thrombophilic condition, Smoking, Older age  Rx Insurance Coverage: Medicare Rx Affordability: Eliquis is $45/month on his insurance, which he says is affordable if short-term. Filled starter pack for $0 at last visit using one time free trial card.  Rx Assistance Provided: Free 30-day trial card Preferred Pharmacy: Starter pack filled during last visit at [facility]. [facility] [address].   Past Medical History:  Diagnosis Date   Asthma    Bronchitis    CKD (chronic kidney disease)    Diabetes mellitus    Diabetic Charcot foot (HCC)    Left   Gout    Hypertension    Kidney stones 11/30/2013   Neuromuscular disorder (HCC)    Obesity    Pancreatitis    Polyneuropathy in diabetes [facility])    Sleep apnea     Past Surgical History:  Procedure Laterality Date   AMPUTATION Left 02/06/2016   Procedure: AMPUTATION BELOW KNEE;  Surgeon: Eldred Manges, MD;  Location: [facility] OR;  Service: Orthopedics;  Laterality: Left;   CHOLECYSTECTOMY N/A 12/03/2013   Procedure: LAPAROSCOPIC CHOLECYSTECTOMY WITH INTRAOPERATIVE CHOLANGIOGRAM;  Surgeon: Emelia Loron, MD;  Location: [facility] OR;  Service: General;  Laterality: N/A;   COLONOSCOPY WITH PROPOFOL N/A 09/01/2017   Procedure: COLONOSCOPY WITH PROPOFOL;  Surgeon:  Charlott Rakes, MD;  Location: [facility];  Service: Endoscopy;  Laterality: N/A;   COLONOSCOPY WITH PROPOFOL N/A 04/23/2021   Procedure: COLONOSCOPY WITH PROPOFOL;  Surgeon: Charlott Rakes, MD;  Location: [facility];  Service: Endoscopy;  Laterality: N/A;   ESOPHAGOGASTRODUODENOSCOPY (EGD) WITH PROPOFOL N/A 09/01/2017   Procedure:  ESOPHAGOGASTRODUODENOSCOPY (EGD) WITH PROPOFOL;  Surgeon: Charlott Rakes, MD;  Location: [facility];  Service: Endoscopy;  Laterality: N/A;   NO PAST SURGERIES     POLYPECTOMY  04/23/2021   Procedure: POLYPECTOMY;  Surgeon: Charlott Rakes, MD;  Location: [facility];  Service: Endoscopy;;    Social History   Socioeconomic History   Marital status: Married    Spouse name: Not on file   Number of children: Not on file   Years of education: Not on file   Highest education level: Not on file  Occupational History   Not on file  Tobacco Use   Smoking status: Never   Smokeless tobacco: Never  Substance and Sexual Activity   Alcohol use: Yes    Comment: occasionally   Drug use: No   Sexual activity: Not on file  Other Topics Concern   Not on file  Social History Narrative   Not on file   Social Determinants of Health   Financial Resource Strain: Not on file  Food Insecurity: Not on file  Transportation Needs: No Transportation Needs (02/20/2022)   PRAPARE - Transportation    Lack of Transportation (Medical): No    Lack of Transportation (Non-Medical): No  Physical Activity: Not on file  Stress: No Stress Concern Present (02/20/2022)   [employer] of Occupational Health - Occupational Stress Questionnaire    Feeling of Stress : Not at all  Social Connections: Not on file  Intimate Partner Violence: Not on file    Family History  Problem Relation Age of Onset   Diabetes Mellitus II Father    Diabetes Father    Hypertension Father    Diabetes Mother    Hypertension Mother    CAD Neg Hx     Allergies as of 04/13/2023   (No Known Allergies)    Current Outpatient Medications on File Prior to Encounter  Medication Sig Dispense Refill   acetaminophen (TYLENOL) 500 MG tablet Take 1,000 mg by mouth every 8 (eight) hours as needed for moderate pain.     albuterol (PROVENTIL HFA;VENTOLIN HFA) 108 (90 Base) MCG/ACT inhaler Inhale 1-2 puffs into the lungs every 6  (six) hours as needed for wheezing or shortness of breath. 1 Inhaler 0   allopurinol (ZYLOPRIM) 300 MG tablet Take 300 mg by mouth daily.     apixaban (ELIQUIS) 5 MG TABS tablet Take 1 tablet (5 mg total) by mouth 2 (two) times daily. Start taking after completion of starter pack. 60 tablet 1   atorvastatin (LIPITOR) 10 MG tablet Take 10 mg by mouth daily.  10   budesonide-formoterol (SYMBICORT) 160-4.5 MCG/ACT inhaler Inhale 2 puffs into the lungs 2 (two) times daily.     cetirizine (ZYRTEC) 10 MG tablet Take 10 mg by mouth daily.     Colchicine 0.6 MG CAPS Take 0.6 mg by mouth daily as needed (gout pain).     dapagliflozin propanediol (FARXIGA) 10 MG TABS tablet Take 10 mg by mouth daily.     dorzolamide-timolol (COSOPT) 22.3-6.8 MG/ML ophthalmic solution 1 drop 2 (two) times daily.     fluticasone (FLONASE) 50 MCG/ACT nasal spray USE ONE SPRAY IN EACH NOSTRIL ONCE DAILY 30 60 (Patient taking  differently: Place 1 spray into both nostrils daily.) 16 g 5   gabapentin (NEURONTIN) 300 MG capsule Take 300 mg by mouth at bedtime.     Insulin Glargine (BASAGLAR KWIKPEN) 100 UNIT/ML Inject 25 Units into the skin daily.     lisinopril-hydrochlorothiazide (PRINZIDE,ZESTORETIC) 10-12.5 MG tablet Take 1 tablet by mouth daily.   2   metFORMIN (GLUCOPHAGE) 1000 MG tablet Take 1,000 mg by mouth 2 (two) times daily.  10   Multiple Vitamin (MULTIVITAMIN WITH MINERALS) TABS tablet Take 1 tablet by mouth daily.     pantoprazole (PROTONIX) 40 MG tablet Take 40 mg by mouth daily.   0   sildenafil (VIAGRA) 100 MG tablet Take 100 mg by mouth as needed for erectile dysfunction.     tetrahydrozoline-zinc (VISINE-AC) 0.05-0.25 % ophthalmic solution Place 2 drops into both eyes 3 (three) times daily as needed (dry eyes).     traMADol (ULTRAM) 50 MG tablet Take 1 tablet (50 mg total) by mouth every 6 (six) hours as needed (for pain). 30 tablet 1   Alcohol Swabs (B-D SINGLE USE SWABS REGULAR) PADS      Blood Glucose  Calibration (TRUE METRIX LEVEL 1) Low SOLN      Blood Glucose Monitoring Suppl (TRUE METRIX METER) w/Device KIT as directed to check blood sugar twice a day     DROPLET PEN NEEDLES 31G X 6 MM MISC      MICROLET LANCETS MISC 1 each daily as needed.  5   mupirocin ointment (BACTROBAN) 2 % Apply 1 Application topically 2 (two) times daily. (Patient not taking: Reported on 04/13/2023)     TRUE METRIX BLOOD GLUCOSE TEST test strip 1 each 3 (three) times daily.     No current facility-administered medications on file prior to encounter.   REVIEW OF SYSTEMS:  Review of Systems  Respiratory:  Negative for shortness of breath.   Cardiovascular:  Negative for chest pain, palpitations and leg swelling.  Musculoskeletal:  Negative for myalgias.  Neurological:  Positive for tingling. Negative for dizziness.   PHYSICAL EXAMINATION:  Vitals:   04/13/23 0849  BP: (!) 142/83  Pulse: 77  SpO2: 99%   Physical Exam Vitals reviewed.  Cardiovascular:     Rate and Rhythm: Normal rate.  Pulmonary:     Effort: Pulmonary effort is normal.  Musculoskeletal:        General: No tenderness.     Right lower leg: Edema (trace to 1+) present.  Skin:    Findings: No bruising or erythema.   Villalta Score for Post-Thrombotic Syndrome: Pain: Absent Cramps: Absent Heaviness: Absent Paresthesia: Mild Pruritus: Absent Pretibial Edema: Mild Skin Induration: Absent Hyperpigmentation: Absent Redness: Absent Venous Ectasia: Absent Pain on calf compression: Absent Villalta Preliminary Score: 2 Is venous ulcer present?: No If venous ulcer is present and score is <15, then 15 points total are assigned: Absent Villalta Total Score: 2  LABS:  CBC     Component Value Date/Time   WBC 9.0 10/27/2022 2028   RBC 4.83 10/27/2022 2028   HGB 14.5 10/27/2022 2028   HCT 42.0 10/27/2022 2028   PLT 167 10/27/2022 2028   MCV 87.0 10/27/2022 2028   MCH 30.0 10/27/2022 2028   MCHC 34.5 10/27/2022 2028   RDW 15.8 (H)  10/27/2022 2028   LYMPHSABS 3.0 06/15/2022 1209   MONOABS 0.4 06/15/2022 1209   EOSABS 0.3 06/15/2022 1209   BASOSABS 0.0 06/15/2022 1209    Hepatic Function      Component Value Date/Time  PROT 7.2 10/19/2022 1933   ALBUMIN 3.8 10/19/2022 1933   AST 28 10/19/2022 1933   ALT 33 10/19/2022 1933   ALKPHOS 60 10/19/2022 1933   BILITOT 0.6 10/19/2022 1933   BILIDIR 0.2 04/10/2017 1003   IBILI 0.5 04/10/2017 1003    Renal Function   Lab Results  Component Value Date   CREATININE 1.48 (H) 10/27/2022   CREATININE 1.33 (H) 10/19/2022   CREATININE 1.19 06/15/2022    CrCl cannot be calculated (Patient's most recent lab result is older than the maximum 21 days allowed.).   Labs current from 03/11/23 in [facility]: Hgb 14.9, Hct 45.6%; 01/12/23: Hgb 15.5, Scr 1.26, eGFR 66, AST 17, ALT 24   [facility] Vascular Lab Studies:  03/11/23 VAS [place] LOWER EXTREMITY VENOUS (DVT) RIGHT Summary:  RIGHT:  - Findings consistent with acute deep vein thrombosis involving the right  posterior tibial veins.  - No cystic structure found in the popliteal fossa.    LEFT:  - No evidence of common femoral vein obstruction.   ASSESSMENT: Location of DVT: Right distal vein Cause of DVT: provoked by a transient risk factor - recent fall with injury to the right leg. Other risk factors present include obesity and decreased mobility. Given that this is an isolated distal DVT that appears to be provoked primarily by recent trauma to the right leg, we have planned to anticoagulate for 3 months. If he were to develop a DVT in the future, long-term anticoagulation would be indicated at that time. He is tolerating Eliquis well with no adverse effects. His pain has improved but he does still have some mild swelling. Encouraged the use of compression stockings. No medication access or adherence concerns at this time. We discussed the importance of increasing physical activity and decreasing extended periods of time that he is sedentary,  and he has been working on this.   PLAN: -Continue apixaban (Eliquis) 5 mg twice daily. -Expected duration of therapy: 3 months. Therapy started on 03/11/23. -Patient educated on purpose, proper use and potential adverse effects of apixaban (Eliquis). -Discussed importance of taking medication around the same time every day. -Advised patient of medications to avoid (NSAIDs, aspirin doses >100 mg daily). -Educated that Tylenol (acetaminophen) is the preferred analgesic to lower the risk of bleeding. -Advised patient to alert all providers of anticoagulation therapy prior to starting a new medication or having a procedure. -Emphasized importance of monitoring for signs and symptoms of bleeding (abnormal bruising, prolonged bleeding, nose bleeds, bleeding from gums, discolored urine, black tarry stools). -Educated patient to present to the ED if emergent signs and symptoms of new thrombosis occur. -Counseled patient to wear compression stockings daily, removing at night. Continue elevation as needed for swelling.  -Counseled patient on future VTE risk reduction strategies and to inform all future providers of DVT history.    Follow up: with PCP. DVT Clinic available as needed.   Pervis Hocking, PharmD, [place], CPP Deep Vein Thrombosis Clinic Clinical Pharmacist Practitioner Office: [phone]

## 2023-04-13 NOTE — Patient Instructions (Addendum)
-  Continue apixaban (Eliquis) 5 mg twice daily. You will take this for a total of 3 months (will end around mid-November).  -[facility]  -It is important to take your medication around the same time every day.  -Avoid NSAIDs like ibuprofen (Advil, Motrin) and naproxen (Aleve) as well as aspirin doses over 100 mg daily. -Tylenol (acetaminophen) is the preferred over the counter pain medication to lower the risk of bleeding. -Be sure to alert all of your health care providers that you are taking an anticoagulant prior to starting a new medication or having a procedure. -Monitor for signs and symptoms of bleeding (abnormal bruising, prolonged bleeding, nose bleeds, bleeding from gums, discolored urine, black tarry stools). If you have fallen and hit your head OR if your bleeding is severe or not stopping, seek emergency care.  -Go to the emergency room if emergent signs and symptoms of new clot occur (new or worse swelling and pain in an arm or leg, shortness of breath, chest pain, fast or irregular heartbeats, lightheadedness, dizziness, fainting, coughing up blood) or if you experience a significant color change (pale or blue) in the extremity that has the DVT.  -We recommend you wear compression stockings as long as you are having swelling or pain. Be sure to purchase the correct size and take them off at night.   If you have any questions or need to reschedule an appointment, please call [phone] [facility].  If you are having an emergency, call 911 or present to the nearest emergency room.   What is a DVT?  -Deep vein thrombosis (DVT) is a condition in which a blood clot forms in a vein of the deep venous system which can occur in the lower leg, thigh, pelvis, arm, or neck. This condition is serious and can be life-threatening if the clot travels to the arteries of the lungs and causing a blockage (pulmonary embolism, PE). A DVT can also damage veins in the leg, which  can lead to long-term venous disease, leg pain, swelling, discoloration, and ulcers or sores (post-thrombotic syndrome).  -Treatment may include taking an anticoagulant medication to prevent more clots from forming and the current clot from growing, wearing compression stockings, and/or surgical procedures to remove or dissolve the clot.

## 2023-05-05 DIAGNOSIS — R6889 Other general symptoms and signs: Secondary | ICD-10-CM | POA: Diagnosis not present

## 2023-06-15 ENCOUNTER — Encounter (HOSPITAL_BASED_OUTPATIENT_CLINIC_OR_DEPARTMENT_OTHER): Payer: Self-pay | Admitting: Student

## 2023-06-15 ENCOUNTER — Ambulatory Visit (HOSPITAL_BASED_OUTPATIENT_CLINIC_OR_DEPARTMENT_OTHER): Payer: Medicare HMO | Admitting: Student

## 2023-06-15 DIAGNOSIS — M25571 Pain in right ankle and joints of right foot: Secondary | ICD-10-CM

## 2023-06-15 DIAGNOSIS — L84 Corns and callosities: Secondary | ICD-10-CM | POA: Diagnosis not present

## 2023-06-15 NOTE — Progress Notes (Signed)
Chief Complaint: Right foot callus     History of Present Illness:    Troy [name] is a 57 y.o. male with history of diabetes and left BKA presenting today for evaluation of a callus on his right foot.  He states that he is seen by podiatry regularly for management and treatment of multiple calluses on his foot.  Last visit was 8/29.  Today he reports that the callus below his right great toe became painful yesterday feels and that there is fluid in it.  Denies any specific treatment at this point.  Pain levels are moderate to severe.  No fever, chills, or redness.  He does note that he has been in new shoes for the past month.   Surgical History:   None to RLE  PMH/PSH/Family History/Social History/Meds/Allergies:    Past Medical History:  Diagnosis Date   Asthma    Bronchitis    CKD (chronic kidney disease)    Diabetes mellitus    Diabetic Charcot foot (HCC)    Left   Gout    Hypertension    Kidney stones 11/30/2013   Neuromuscular disorder (HCC)    Obesity    Pancreatitis    Polyneuropathy in diabetes [facility])    Sleep apnea    Past Surgical History:  Procedure Laterality Date   AMPUTATION Left 02/06/2016   Procedure: AMPUTATION BELOW KNEE;  Surgeon: Eldred Manges, MD;  Location: [facility] OR;  Service: Orthopedics;  Laterality: Left;   CHOLECYSTECTOMY N/A 12/03/2013   Procedure: LAPAROSCOPIC CHOLECYSTECTOMY WITH INTRAOPERATIVE CHOLANGIOGRAM;  Surgeon: Emelia Loron, MD;  Location: [facility] OR;  Service: General;  Laterality: N/A;   COLONOSCOPY WITH PROPOFOL N/A 09/01/2017   Procedure: COLONOSCOPY WITH PROPOFOL;  Surgeon: Charlott Rakes, MD;  [facility];  Service: Endoscopy;  Laterality: N/A;   COLONOSCOPY WITH PROPOFOL N/A 04/23/2021   Procedure: COLONOSCOPY WITH PROPOFOL;  Surgeon: Charlott Rakes, MD;  [facility];  Service: Endoscopy;  Laterality: N/A;   ESOPHAGOGASTRODUODENOSCOPY (EGD) WITH PROPOFOL N/A 09/01/2017    Procedure: ESOPHAGOGASTRODUODENOSCOPY (EGD) WITH PROPOFOL;  Surgeon: Charlott Rakes, MD;  [facility];  Service: Endoscopy;  Laterality: N/A;   NO PAST SURGERIES     POLYPECTOMY  04/23/2021   Procedure: POLYPECTOMY;  Surgeon: Charlott Rakes, MD;  [facility];  Service: Endoscopy;;   Social History   Socioeconomic History   Marital status: Married    Spouse name: Not on file   Number of children: Not on file   Years of education: Not on file   Highest education level: Not on file  Occupational History   Not on file  Tobacco Use   Smoking status: Never   Smokeless tobacco: Never  Substance and Sexual Activity   Alcohol use: Yes    Comment: occasionally   Drug use: No   Sexual activity: Not on file  Other Topics Concern   Not on file  Social History Narrative   Not on file   Social Determinants of Health   Financial Resource Strain: Not on file  Food Insecurity: Not on file  Transportation Needs: No Transportation Needs (02/20/2022)   PRAPARE - Transportation    Lack of Transportation (Medical): No    Lack of Transportation (Non-Medical): No  Physical Activity: Not on file  Stress: No Stress Concern Present (02/20/2022)  [employer] of Occupational Health - Occupational Stress Questionnaire    Feeling of Stress : Not at all  Social Connections: Not on file   Family History  Problem Relation Age of Onset   Diabetes Mellitus II Father    Diabetes Father    Hypertension Father    Diabetes Mother    Hypertension Mother    CAD Neg Hx    No Known Allergies Current Outpatient Medications  Medication Sig Dispense Refill   acetaminophen (TYLENOL) 500 MG tablet Take 1,000 mg by mouth every 8 (eight) hours as needed for moderate pain.     albuterol (PROVENTIL HFA;VENTOLIN HFA) 108 (90 Base) MCG/ACT inhaler Inhale 1-2 puffs into the lungs every 6 (six) hours as needed for wheezing or shortness of breath. 1 Inhaler 0   Alcohol Swabs (B-D SINGLE  USE SWABS REGULAR) PADS      allopurinol (ZYLOPRIM) 300 MG tablet Take 300 mg by mouth daily.     apixaban (ELIQUIS) 5 MG TABS tablet Take 1 tablet (5 mg total) by mouth 2 (two) times daily. Start taking after completion of starter pack. 60 tablet 1   atorvastatin (LIPITOR) 10 MG tablet Take 10 mg by mouth daily.  10   Blood Glucose Calibration (TRUE METRIX LEVEL 1) Low SOLN      Blood Glucose Monitoring Suppl (TRUE METRIX METER) w/Device KIT as directed to check blood sugar twice a day     budesonide-formoterol (SYMBICORT) 160-4.5 MCG/ACT inhaler Inhale 2 puffs into the lungs 2 (two) times daily.     cetirizine (ZYRTEC) 10 MG tablet Take 10 mg by mouth daily.     Colchicine 0.6 MG CAPS Take 0.6 mg by mouth daily as needed (gout pain).     dapagliflozin propanediol (FARXIGA) 10 MG TABS tablet Take 10 mg by mouth daily.     dorzolamide-timolol (COSOPT) 22.3-6.8 MG/ML ophthalmic solution 1 drop 2 (two) times daily.     DROPLET PEN NEEDLES 31G X 6 MM MISC      fluticasone (FLONASE) 50 MCG/ACT nasal spray USE ONE SPRAY IN EACH NOSTRIL ONCE DAILY 30 60 (Patient taking differently: Place 1 spray into both nostrils daily.) 16 g 5   gabapentin (NEURONTIN) 300 MG capsule Take 300 mg by mouth at bedtime.     Insulin Glargine (BASAGLAR KWIKPEN) 100 UNIT/ML Inject 25 Units into the skin daily.     lisinopril-hydrochlorothiazide (PRINZIDE,ZESTORETIC) 10-12.5 MG tablet Take 1 tablet by mouth daily.   2   metFORMIN (GLUCOPHAGE) 1000 MG tablet Take 1,000 mg by mouth 2 (two) times daily.  10   MICROLET LANCETS MISC 1 each daily as needed.  5   Multiple Vitamin (MULTIVITAMIN WITH MINERALS) TABS tablet Take 1 tablet by mouth daily.     mupirocin ointment (BACTROBAN) 2 % Apply 1 Application topically 2 (two) times daily. (Patient not taking: Reported on 04/13/2023)     pantoprazole (PROTONIX) 40 MG tablet Take 40 mg by mouth daily.   0   sildenafil (VIAGRA) 100 MG tablet Take 100 mg by mouth as needed for erectile  dysfunction.     tetrahydrozoline-zinc (VISINE-AC) 0.05-0.25 % ophthalmic solution Place 2 drops into both eyes 3 (three) times daily as needed (dry eyes).     traMADol (ULTRAM) 50 MG tablet Take 1 tablet (50 mg total) by mouth every 6 (six) hours as needed (for pain). 30 tablet 1   TRUE METRIX BLOOD GLUCOSE TEST test strip 1 each 3 (three) times daily.     No  current facility-administered medications for this visit.   No results found.  Review of Systems:   A ROS was performed including pertinent positives and negatives as documented in the HPI.  Physical Exam :   Constitutional: NAD and appears stated age Neurological: Alert and oriented Psych: Appropriate affect and cooperative There were no vitals taken for this visit.   Comprehensive Musculoskeletal Exam:    Large hyperkeratotic lesion noted of the first MTP joint of the right foot.  No surrounding erythema.  A small amount of fluid was expressed with palpation.  Another callus is noted on the plantar aspect of the forefoot.  Brisk capillary refill to all 5 toes.  Imaging:    Assessment:   57 y.o. male with a large callus noted at the right first MTP joint.  I was able to express some clear fluid from this area today with gentle pressure.  Discussed possibility of developing infection.  Given the acute increase in pain and drainage, I would recommend having this evaluated further.  He is not able to be seen by podiatry until early December, so I will plan to get him in with Dr. Audrie Lia office sooner.  He has Mupirocin ointment at home which he can use topically in the meantime.  Continue to monitor for any signs of infection including redness, swelling, or increased drainage.  Plan :    - Referral to Dr. Jeanella Anton for further assessment and management     I personally saw and evaluated the patient, and participated in the management and treatment plan.  Hazle Nordmann, PA-C Orthopedics

## 2023-06-16 ENCOUNTER — Other Ambulatory Visit (HOSPITAL_COMMUNITY): Payer: Self-pay

## 2023-06-16 ENCOUNTER — Other Ambulatory Visit: Payer: Self-pay

## 2023-06-16 ENCOUNTER — Ambulatory Visit: Payer: Medicare HMO | Admitting: Family

## 2023-06-16 ENCOUNTER — Encounter: Payer: Self-pay | Admitting: Family

## 2023-06-16 DIAGNOSIS — L97511 Non-pressure chronic ulcer of other part of right foot limited to breakdown of skin: Secondary | ICD-10-CM

## 2023-06-16 DIAGNOSIS — H40053 Ocular hypertension, bilateral: Secondary | ICD-10-CM | POA: Diagnosis not present

## 2023-06-16 DIAGNOSIS — H3582 Retinal ischemia: Secondary | ICD-10-CM | POA: Diagnosis not present

## 2023-06-16 DIAGNOSIS — E113513 Type 2 diabetes mellitus with proliferative diabetic retinopathy with macular edema, bilateral: Secondary | ICD-10-CM | POA: Diagnosis not present

## 2023-06-16 DIAGNOSIS — Z961 Presence of intraocular lens: Secondary | ICD-10-CM | POA: Diagnosis not present

## 2023-06-16 MED ORDER — CEPHALEXIN 500 MG PO CAPS: 500.0000 mg | ORAL_CAPSULE | Freq: Three times a day (TID) | ORAL | 0 refills | Status: AC

## 2023-06-16 MED ORDER — CEPHALEXIN 500 MG PO CAPS: 500.0000 mg | ORAL_CAPSULE | Freq: Three times a day (TID) | ORAL | 0 refills | Status: DC

## 2023-06-16 NOTE — Progress Notes (Signed)
Office Visit Note   Patient: Troy [name]           Date of Birth: [birth year]           MRN: [id number] Visit Date: 06/16/2023              Requested by: Renford Dills, MD [address] [place],  [place] [address] PCP: Renford Dills, MD  Chief Complaint  Patient presents with   Right Foot - Wound Check      HPI: The patient is a 57 year old gentleman who is seen today for evaluation of a callus to the right foot.  Was seen yesterday and evaluated for the same in our office.  Has a callus over the MTP joint of the right great toe medial border that became painful 2 days ago he noticed some fluid in it.  Reports that this did drain some clear fluid in the office yesterday.  He denies fevers chills warmth  Does typically follow with podiatry  Has extra-depth shoes extrawide, reports he has not had these stretched  His previous extra-depth shoes, diabetic shoes became worn out and unstable has been in his new shoes as above for about a month now  Assessment & Plan: Visit Diagnoses: No diagnosis found.  Plan: Out of an abundance of caution we will place him on a course of Keflex.  He will continue with daily dose of cleansing dry dressings he will continue his felt pressure relieving pad in his shoe wear encouraged him to have his new shoes stretched  Has follow-up with podiatry on December 3  Advised may call or follow-up with [place] for any concerning sign  Follow-Up Instructions: No follow-ups on file.   Ortho Exam  Patient is alert, oriented, no adenopathy, well-dressed, normal affect, normal respiratory effort. On examination right foot over the medial border of the MTP joint he does have an area of hyperkeratotic tissue.  There is an area of fluctuance this was lanced after informed consent there is serous fluid present.  Some surrounding maceration there is no erythema warmth or purulence  Imaging: No results found. No images are attached to the  encounter.  Labs: Lab Results  Component Value Date   HGBA1C 6.5 (H) 02/02/2016   HGBA1C 7.0 (H) 11/09/2013   HGBA1C 11.0 (H) 07/19/2013   LABURIC 6.5 02/06/2016   REPTSTATUS 02/03/2016 FINAL 02/02/2016   CULT NO GROWTH 02/02/2016     Lab Results  Component Value Date   ALBUMIN 3.8 10/19/2022   ALBUMIN 3.9 06/15/2022   ALBUMIN 4.0 06/14/2022    No results found for: "MG" No results found for: "VD25OH"  No results found for: "PREALBUMIN"    Latest Ref Rng & Units 10/27/2022    8:28 PM 10/19/2022    7:33 PM 06/15/2022   12:09 PM  CBC EXTENDED  WBC 4.0 - 10.5 K/uL 9.0  8.7  7.0   RBC 4.22 - 5.81 MIL/uL 4.83  5.28  5.45   Hemoglobin 13.0 - 17.0 g/dL 19.1  47.8  29.5   HCT 39.0 - 52.0 % 42.0  45.8  47.4   Platelets 150 - 400 K/uL 167  169  157   NEUT# 1.7 - 7.7 K/uL   3.2   Lymph# 0.7 - 4.0 K/uL   3.0      There is no height or weight on file to calculate BMI.  Orders:  No orders of the defined types were placed in this encounter.  Meds  ordered this encounter  Medications   cephALEXin (KEFLEX) 500 MG capsule    Sig: Take 1 capsule (500 mg total) by mouth 3 (three) times daily.    Dispense:  30 capsule    Refill:  0     Procedures: No procedures performed  Clinical Data: No additional findings.  ROS:  All other systems negative, except as noted in the HPI. Review of Systems  Objective: Vital Signs: There were no vitals taken for this visit.  Specialty Comments:  No specialty comments available.  PMFS History: Patient Active Problem List   Diagnosis Date Noted   Acute deep vein thrombosis (DVT) of tibial vein of right lower extremity (HCC) 03/11/2023   History of colonic polyps 04/23/2021   Gout 09/25/2020   Morbid obesity (HCC) 09/25/2020   Painful amputation stump (HCC) 07/01/2019   Hyperlipidemia 09/07/2018   Diabetic foot ulcer (HCC) 09/07/2018   History of left below knee amputation (HCC) 03/26/2018   Dysphagia 09/01/2017   Special  screening for malignant neoplasms, colon 09/01/2017   Esophageal reflux 09/01/2017   Acute osteomyelitis of left foot (HCC)    Diabetic foot (HCC) 02/02/2016   Osteomyelitis (HCC) 02/02/2016   Acute osteomyelitis of left foot (HCC) 02/02/2016   CKD (chronic kidney disease), stage II 02/02/2016   Anemia 02/02/2016   Kidney stones 11/30/2013   Cholelithiasis: PER CT ABD/PELVIS AND ABD [place] 11/30/13 11/30/2013   Acute renal failure (HCC) 11/29/2013   Acute pancreatitis 11/29/2013   Asthma exacerbation 11/09/2013   CAP (community acquired pneumonia) 07/18/2013   Diabetes (HCC) 07/18/2013   Hypertension    Past Medical History:  Diagnosis Date   Asthma    Bronchitis    CKD (chronic kidney disease)    Diabetes mellitus    Diabetic Charcot foot (HCC)    Left   Gout    Hypertension    Kidney stones 11/30/2013   Neuromuscular disorder (HCC)    Obesity    Pancreatitis    Polyneuropathy in diabetes (HCC)    Sleep apnea     Family History  Problem Relation Age of Onset   Diabetes Mellitus II Father    Diabetes Father    Hypertension Father    Diabetes Mother    Hypertension Mother    CAD Neg Hx     Past Surgical History:  Procedure Laterality Date   AMPUTATION Left 02/06/2016   Procedure: AMPUTATION BELOW KNEE;  Surgeon: Eldred Manges, MD;  Location: [facility];  Service: Orthopedics;  Laterality: Left;   CHOLECYSTECTOMY N/A 12/03/2013   Procedure: LAPAROSCOPIC CHOLECYSTECTOMY WITH INTRAOPERATIVE CHOLANGIOGRAM;  Surgeon: Emelia Loron, MD;  Location: [facility];  Service: General;  Laterality: N/A;   COLONOSCOPY WITH PROPOFOL N/A 09/01/2017   Procedure: COLONOSCOPY WITH PROPOFOL;  Surgeon: Charlott Rakes, MD;  Location: [facility];  Service: Endoscopy;  Laterality: N/A;   COLONOSCOPY WITH PROPOFOL N/A 04/23/2021   Procedure: COLONOSCOPY WITH PROPOFOL;  Surgeon: Charlott Rakes, MD;  Location: [facility];  Service: Endoscopy;  Laterality: N/A;   ESOPHAGOGASTRODUODENOSCOPY (EGD) WITH  PROPOFOL N/A 09/01/2017   Procedure: ESOPHAGOGASTRODUODENOSCOPY (EGD) WITH PROPOFOL;  Surgeon: Charlott Rakes, MD;  [facility]: [facility];  Service: Endoscopy;  Laterality: N/A;   NO PAST SURGERIES     POLYPECTOMY  04/23/2021   Procedure: POLYPECTOMY;  Surgeon: Charlott Rakes, MD;  Location: [facility];  Service: Endoscopy;;   Social History   Occupational History   Not on file  Tobacco Use   Smoking status: Never   Smokeless tobacco:  Never  Substance and Sexual Activity   Alcohol use: Yes    Comment: occasionally   Drug use: No   Sexual activity: Not on file

## 2023-06-30 ENCOUNTER — Encounter: Payer: Self-pay | Admitting: Podiatry

## 2023-06-30 ENCOUNTER — Ambulatory Visit: Payer: Medicare HMO | Admitting: Podiatry

## 2023-06-30 DIAGNOSIS — M79676 Pain in unspecified toe(s): Secondary | ICD-10-CM | POA: Diagnosis not present

## 2023-06-30 DIAGNOSIS — R6889 Other general symptoms and signs: Secondary | ICD-10-CM | POA: Diagnosis not present

## 2023-06-30 DIAGNOSIS — E1142 Type 2 diabetes mellitus with diabetic polyneuropathy: Secondary | ICD-10-CM | POA: Diagnosis not present

## 2023-06-30 DIAGNOSIS — B351 Tinea unguium: Secondary | ICD-10-CM

## 2023-06-30 DIAGNOSIS — L97511 Non-pressure chronic ulcer of other part of right foot limited to breakdown of skin: Secondary | ICD-10-CM

## 2023-06-30 DIAGNOSIS — D2371 Other benign neoplasm of skin of right lower limb, including hip: Secondary | ICD-10-CM

## 2023-06-30 MED ORDER — MUPIROCIN 2 % EX OINT: 1.0000 | TOPICAL_OINTMENT | Freq: Two times a day (BID) | CUTANEOUS | 0 refills | Status: AC

## 2023-06-30 NOTE — Progress Notes (Signed)
He presents today for routine evaluation and debridement of callus and nails 1 through 5 right foot and plantar aspect of the forefoot right.  He relates a blister and saw Dr. Audrie Lia office to the first metatarsophalangeal joint right foot states that seems to be improving.  Took all of his antibiotics and continues to utilize his Bactroban ointment daily.  Objective: Vital signs stable alert oriented x 3.  Pulses are palpable to the right lower extremity hammertoe deformities resulting in a plantarflexed second metatarsal and benign skin lesion plantarly no open lesion plantarly.  Superficial open lesion to the dorsal medial aspect of the first metatarsophalangeal joint appears to be granulating in nicely.  There is fibrin deposition.  Epithelialization is occurring.  Toenails are long thick yellow dystrophic like mycotic 1 through 5 right foot.  Assessment: Diabetic ulcerative lesion dorsal aspect first metatarsophalangeal joint of the right foot benign skin lesions plantar aspect forefoot right and painful elongated toenails 1 through 5 right with diabetic peripheral neuropathy.  Plan: Debridement of toenails 1 through 5 of the right foot only.  Debridement of benign skin lesion.  Recommend he continue the Bactroban ointment to the wound daily.  Small amount covered with a Band-Aid and then placed his padding.  He is going to follow-up with [facility] in 1 month for diabetic shoes molding.  Also going to follow-up with me in 3 months for routine debridement.

## 2023-07-13 DIAGNOSIS — R6889 Other general symptoms and signs: Secondary | ICD-10-CM | POA: Diagnosis not present

## 2023-07-13 DIAGNOSIS — I10 Essential (primary) hypertension: Secondary | ICD-10-CM | POA: Diagnosis not present

## 2023-07-13 DIAGNOSIS — E7800 Pure hypercholesterolemia, unspecified: Secondary | ICD-10-CM | POA: Diagnosis not present

## 2023-07-13 DIAGNOSIS — E11621 Type 2 diabetes mellitus with foot ulcer: Secondary | ICD-10-CM | POA: Diagnosis not present

## 2023-07-13 DIAGNOSIS — Z794 Long term (current) use of insulin: Secondary | ICD-10-CM | POA: Diagnosis not present

## 2023-07-13 DIAGNOSIS — E1151 Type 2 diabetes mellitus with diabetic peripheral angiopathy without gangrene: Secondary | ICD-10-CM | POA: Diagnosis not present

## 2023-07-13 DIAGNOSIS — Z89512 Acquired absence of left leg below knee: Secondary | ICD-10-CM | POA: Diagnosis not present

## 2023-07-13 DIAGNOSIS — E113552 Type 2 diabetes mellitus with stable proliferative diabetic retinopathy, left eye: Secondary | ICD-10-CM | POA: Diagnosis not present

## 2023-07-13 DIAGNOSIS — M1A9XX1 Chronic gout, unspecified, with tophus (tophi): Secondary | ICD-10-CM | POA: Diagnosis not present

## 2023-07-24 ENCOUNTER — Ambulatory Visit (HOSPITAL_COMMUNITY)
Admission: RE | Admit: 2023-07-24 | Discharge: 2023-07-24 | Disposition: A | Discharge status: Home / Self Care | Payer: Medicare HMO | Source: Ambulatory Visit | Attending: Nurse Practitioner | Admitting: Nurse Practitioner

## 2023-07-24 ENCOUNTER — Encounter (HOSPITAL_COMMUNITY): Payer: Self-pay

## 2023-07-24 ENCOUNTER — Ambulatory Visit (INDEPENDENT_AMBULATORY_CARE_PROVIDER_SITE_OTHER): Payer: Medicare HMO

## 2023-07-24 ENCOUNTER — Other Ambulatory Visit (HOSPITAL_COMMUNITY): Payer: Self-pay

## 2023-07-24 ENCOUNTER — Telehealth (HOSPITAL_COMMUNITY): Payer: Self-pay | Admitting: Emergency Medicine

## 2023-07-24 VITALS — BP 128/85 | HR 77 | Temp 98.2°F | Resp 20 | Ht 70.0 in | Wt 288.0 lb

## 2023-07-24 DIAGNOSIS — M14679 Charcot's joint, unspecified ankle and foot: Secondary | ICD-10-CM | POA: Diagnosis not present

## 2023-07-24 DIAGNOSIS — M79671 Pain in right foot: Secondary | ICD-10-CM

## 2023-07-24 DIAGNOSIS — M7731 Calcaneal spur, right foot: Secondary | ICD-10-CM | POA: Diagnosis not present

## 2023-07-24 DIAGNOSIS — M7989 Other specified soft tissue disorders: Secondary | ICD-10-CM | POA: Diagnosis not present

## 2023-07-24 DIAGNOSIS — M19071 Primary osteoarthritis, right ankle and foot: Secondary | ICD-10-CM | POA: Diagnosis not present

## 2023-07-24 MED ORDER — TIZANIDINE HCL 2 MG PO TABS: 2.0000 mg | ORAL_TABLET | Freq: Three times a day (TID) | ORAL | 0 refills | Status: AC | PRN

## 2023-07-24 MED ORDER — TIZANIDINE HCL 2 MG PO TABS
2.0000 mg | ORAL_TABLET | Freq: Three times a day (TID) | ORAL | 0 refills | Status: DC | PRN
  Filled 2023-07-24: qty 21, 7d supply, fill #0

## 2023-07-24 NOTE — ED Provider Notes (Signed)
[facility]    CSN: [id number] Arrival date & time: 07/24/23  4098      History   Chief Complaint Chief Complaint  Patient presents with   Foot Pain    Been experiencing pain like spasms on outside of right foot - Entered by patient    HPI Troy [name] is a 57 y.o. male.   HPI  He is in today for evaluation of right foot pain.  He reports that he is having some pain that radiates from the outer side of his foot into his heel.  Denies any accident or injury.  He has a history of polyneuropathy in diabetes.  He has restarted his gabapentin 300 mg 3 times daily.  He reports that he limits taking this because it does make him sleepy.  He endorses that he does have some numbness and tingling throughout his foot. He is recently been diagnosed with DVT was on Eliquis which has been discontinued since November and is status post left  below knee amputation.  He is followed by podiatry every 3 months for callus to great toe.  He reports that he started to experience the pain after his routine debridement from podiatry.  He reports that he did call into podiatry however they have no available appointments until March.  He is to be fitted for his new diabetic shoe in January.  He denies any shortness of breath or chest pains.  He reports that his CBG is within normal range however unable to locate A1c in chart. Past Medical History:  Diagnosis Date   Asthma    Bronchitis    CKD (chronic kidney disease)    Diabetes mellitus    Diabetic Charcot foot (HCC)    Left   Gout    Hypertension    Kidney stones 11/30/2013   Neuromuscular disorder (HCC)    Obesity    Pancreatitis    Polyneuropathy in diabetes [facility])    Sleep apnea     Patient Active Problem List   Diagnosis Date Noted   Acute deep vein thrombosis (DVT) of tibial vein of right lower extremity (HCC) 03/11/2023   History of colonic polyps 04/23/2021   Gout 09/25/2020   Morbid obesity (HCC) 09/25/2020   Painful  amputation stump (HCC) 07/01/2019   Hyperlipidemia 09/07/2018   Diabetic foot ulcer (HCC) 09/07/2018   History of left below knee amputation (HCC) 03/26/2018   Dysphagia 09/01/2017   Special screening for malignant neoplasms, colon 09/01/2017   Esophageal reflux 09/01/2017   Acute osteomyelitis of left foot (HCC)    Diabetic foot (HCC) 02/02/2016   Osteomyelitis (HCC) 02/02/2016   Acute osteomyelitis of left foot (HCC) 02/02/2016   CKD (chronic kidney disease), stage II 02/02/2016   Anemia 02/02/2016   Kidney stones 11/30/2013   Cholelithiasis: PER CT ABD/PELVIS AND ABD [place] 11/30/13 11/30/2013   Acute renal failure (HCC) 11/29/2013   Acute pancreatitis 11/29/2013   Asthma exacerbation 11/09/2013   CAP (community acquired pneumonia) 07/18/2013   Diabetes (HCC) 07/18/2013   Hypertension     Past Surgical History:  Procedure Laterality Date   AMPUTATION Left 02/06/2016   Procedure: AMPUTATION BELOW KNEE;  Surgeon: Eldred Manges, MD;  Location: [facility];  Service: Orthopedics;  Laterality: Left;   CHOLECYSTECTOMY N/A 12/03/2013   Procedure: LAPAROSCOPIC CHOLECYSTECTOMY WITH INTRAOPERATIVE CHOLANGIOGRAM;  Surgeon: Emelia Loron, MD;  Location: [facility] OR;  Service: General;  Laterality: N/A;   COLONOSCOPY WITH PROPOFOL N/A 09/01/2017   Procedure: COLONOSCOPY WITH  PROPOFOL;  Surgeon: Charlott Rakes, MD;  Location: [facility];  Service: Endoscopy;  Laterality: N/A;   COLONOSCOPY WITH PROPOFOL N/A 04/23/2021   Procedure: COLONOSCOPY WITH PROPOFOL;  Surgeon: Charlott Rakes, MD;  Location: [facility];  Service: Endoscopy;  Laterality: N/A;   ESOPHAGOGASTRODUODENOSCOPY (EGD) WITH PROPOFOL N/A 09/01/2017   Procedure: ESOPHAGOGASTRODUODENOSCOPY (EGD) WITH PROPOFOL;  Surgeon: Charlott Rakes, MD;  [facility];  Service: Endoscopy;  Laterality: N/A;   NO PAST SURGERIES     POLYPECTOMY  04/23/2021   Procedure: POLYPECTOMY;  Surgeon: Charlott Rakes, MD;  Location: [facility];  Service:  Endoscopy;;       Home Medications    Prior to Admission medications   Medication Sig Start Date End Date Taking? Authorizing Provider  tiZANidine (ZANAFLEX) 2 MG tablet Take 1 tablet (2 mg total) by mouth every 8 (eight) hours as needed for up to 7 days for muscle spasms. 07/24/23 07/31/23 Yes Barbette Merino, NP  acetaminophen (TYLENOL) 500 MG tablet Take 1,000 mg by mouth every 8 (eight) hours as needed for moderate pain.    [provider]  albuterol (PROVENTIL HFA;VENTOLIN HFA) 108 (90 Base) MCG/ACT inhaler Inhale 1-2 puffs into the lungs every 6 (six) hours as needed for wheezing or shortness of breath. 08/25/15   Santiago Glad, PA-C  Alcohol Swabs (B-D SINGLE USE SWABS REGULAR) PADS  07/29/19   [provider]  allopurinol (ZYLOPRIM) 300 MG tablet Take 300 mg by mouth daily. 03/09/22   [provider]  apixaban (ELIQUIS) 5 MG TABS tablet Take 1 tablet (5 mg total) by mouth 2 (two) times daily. Start taking after completion of starter pack. 03/11/23   Pervis Hocking B, RPH-CPP  atorvastatin (LIPITOR) 10 MG tablet Take 10 mg by mouth daily. 03/06/17   [provider]  Blood Glucose Calibration (TRUE METRIX LEVEL 1) Low SOLN  09/01/22   [provider]  Blood Glucose Monitoring Suppl (TRUE METRIX METER) w/Device KIT as directed to check blood sugar twice a day 06/12/21   [provider]  budesonide-formoterol (SYMBICORT) 160-4.5 MCG/ACT inhaler Inhale 2 puffs into the lungs 2 (two) times daily.    [provider]  cephALEXin (KEFLEX) 500 MG capsule Take 1 capsule (500 mg total) by mouth 3 (three) times daily. 06/16/23   Adonis Huguenin, NP  cetirizine (ZYRTEC) 10 MG tablet Take 10 mg by mouth daily.    [provider]  Colchicine 0.6 MG CAPS Take 0.6 mg by mouth daily as needed (gout pain). 02/07/19   [provider]  dapagliflozin propanediol (FARXIGA) 10 MG TABS tablet Take 10 mg by mouth daily.    [provider]  dorzolamide-timolol (COSOPT) 22.3-6.8 MG/ML ophthalmic solution 1 drop 2 (two) times daily. 01/15/22   [provider]  DROPLET PEN NEEDLES 31G X 6 MM MISC  07/29/19   [provider]  fluticasone (FLONASE) 50 MCG/ACT nasal spray USE ONE SPRAY IN EACH NOSTRIL ONCE DAILY 30 60 Patient taking differently: Place 1 spray into both nostrils daily. 06/29/20 04/13/23  Renford Dills, MD  gabapentin (NEURONTIN) 300 MG capsule Take 300 mg by mouth at bedtime. 02/03/20   [provider]  Insulin Glargine (BASAGLAR KWIKPEN) 100 UNIT/ML Inject 25 Units into the skin daily.    [provider]  lisinopril-hydrochlorothiazide (PRINZIDE,ZESTORETIC) 10-12.5 MG tablet Take 1 tablet by mouth daily.  05/06/16   [provider]  metFORMIN (GLUCOPHAGE) 1000 MG tablet Take 1,000 mg by mouth 2 (two) times daily. 03/16/17  [provider]  MICROLET LANCETS MISC 1 each daily as needed. 02/16/17   [provider]  Multiple Vitamin (MULTIVITAMIN WITH MINERALS) TABS tablet Take 1 tablet by mouth daily.    [provider]  mupirocin ointment (BACTROBAN) 2 % Apply 1 Application topically 2 (two) times daily. Patient not taking: Reported on 04/13/2023 03/08/23   [provider]  mupirocin ointment (BACTROBAN) 2 % Apply 1 Application topically 2 (two) times daily. 06/30/23   Hyatt, Max T, DPM  pantoprazole (PROTONIX) 40 MG tablet Take 40 mg by mouth daily.  06/11/16   [provider]  sildenafil (VIAGRA) 100 MG tablet Take 100 mg by mouth as needed for erectile dysfunction. 01/04/19   [provider]  tetrahydrozoline-zinc (VISINE-AC) 0.05-0.25 % ophthalmic solution Place 2 drops into both eyes 3 (three) times daily as needed (dry eyes).    [provider]  traMADol (ULTRAM) 50 MG tablet Take 1 tablet (50 mg total) by mouth every 6 (six) hours as needed (for pain). 02/06/22   Molpus, John, MD  TRUE METRIX BLOOD GLUCOSE  TEST test strip 1 each 3 (three) times daily. 01/30/22   [provider]    Family History Family History  Problem Relation Age of Onset   Diabetes Mellitus II Father    Diabetes Father    Hypertension Father    Diabetes Mother    Hypertension Mother    CAD Neg Hx     Social History Social History   Tobacco Use   Smoking status: Never   Smokeless tobacco: Never  Vaping Use   Vaping status: Never Used  Substance Use Topics   Alcohol use: Yes    Comment: occasionally   Drug use: No     Allergies   Patient has no known allergies.   Review of Systems Review of Systems   Physical Exam Triage Vital Signs ED Triage Vitals  Encounter Vitals Group     BP 07/24/23 0856 128/85     Systolic BP Percentile --      Diastolic BP Percentile --      Pulse Rate 07/24/23 0856 77     Resp 07/24/23 0856 20     Temp 07/24/23 0856 98.2 F (36.8 C)     Temp Source 07/24/23 0856 Oral     SpO2 07/24/23 0856 94 %     Weight 07/24/23 0855 288 lb (130.6 kg)     Height 07/24/23 0855 5\' 10"  (1.778 m)     Head Circumference --      Peak Flow --      Pain Score 07/24/23 0854 2     Pain Loc --      Pain Education --      Exclude from Growth Chart --    No data found.  Updated Vital Signs BP 128/85 (BP Location: Left Arm)   Pulse 77   Temp 98.2 F (36.8 C) (Oral)   Resp 20   Ht 5\' 10"  (1.778 m)   Wt 288 lb (130.6 kg)   SpO2 94%   BMI 41.32 kg/m   Visual Acuity Right Eye Distance:   Left Eye Distance:   Bilateral Distance:    Right Eye Near:   Left Eye Near:    Bilateral Near:     Physical Exam Constitutional:      General: He is not in acute distress.    Appearance: He is obese.  HENT:     Head: Normocephalic.  Cardiovascular:  Rate and Rhythm: Normal rate.     Pulses:          Dorsalis pedis pulses are 2+ on the right side.  Pulmonary:     Effort: Pulmonary effort is normal.  Musculoskeletal:     Cervical back: Normal range of motion.     Right  lower leg: Edema (Trace) present.     Comments: Right lower extremity ankle shiny and taut which is consistent with history of DVT.     Left Lower Extremity: Left leg is amputated below knee.  Feet:     Right foot:     Skin integrity: Callus present.  Neurological:     Mental Status: He is alert and oriented to person, place, and time.      UC Treatments / Results  Labs (all labs ordered are listed, but only abnormal results are displayed) Labs Reviewed - No data to display  EKG   Radiology No results found.  Procedures Procedures (including critical care time)  Medications Ordered in UC Medications - No data to display  Initial Impression / Assessment and Plan / UC Course  I have reviewed the triage vital signs and the nursing notes.  Pertinent labs & imaging results that were available during my care of the patient were reviewed by me and considered in my medical decision making (see chart for details).     Right foot pain Final Clinical Impressions(s) / UC Diagnoses   Final diagnoses:  Right foot pain     Discharge Instructions      Your official  x-ray overread is pending. Your results will come through in your MyChart.  If there are any positive or abnormal results you will be notified.  You have been prescribed tizanidine 2 mg 3 times daily as needed for muscle spasms.  You are encouraged to follow-up with podiatry if symptoms persist or get worse for additional treatment recommendations.  As discussed if you develop any shortness of breath or chest pain or encouraged to go to the nearest emergency room for further evaluation.     ED Prescriptions     Medication Sig Dispense Auth. Provider   tiZANidine (ZANAFLEX) 2 MG tablet Take 1 tablet (2 mg total) by mouth every 8 (eight) hours as needed for up to 7 days for muscle spasms. 21 tablet Barbette Merino, NP      PDMP not reviewed this encounter.   Thad Ranger [place], [place] 07/24/23 1015

## 2023-07-24 NOTE — ED Triage Notes (Addendum)
Pt presents with right foot pain, "spasms", since 12/18. Pt rates his pain a 2/10 at this time. Pain worsens at night, describes as throbbing, "pain is mainly on the outside of foot, radiating up to heel". Pt states he has taken Gabapentin and Tylenol with little improvement. Per pt, hx of blood clot in right leg.

## 2023-07-24 NOTE — Discharge Instructions (Addendum)
Your official  x-ray overread is pending. Your results will come through in your MyChart.  If there are any positive or abnormal results you will be notified.  You have been prescribed tizanidine 2 mg 3 times daily as needed for muscle spasms.  You are encouraged to follow-up with podiatry if symptoms persist or get worse for additional treatment recommendations.  As discussed if you develop any shortness of breath or chest pain or encouraged to go to the nearest emergency room for further evaluation.

## 2023-08-10 ENCOUNTER — Ambulatory Visit: Payer: No Typology Code available for payment source

## 2023-08-10 DIAGNOSIS — L97512 Non-pressure chronic ulcer of other part of right foot with fat layer exposed: Secondary | ICD-10-CM

## 2023-08-10 DIAGNOSIS — M2011 Hallux valgus (acquired), right foot: Secondary | ICD-10-CM

## 2023-08-10 DIAGNOSIS — M2141 Flat foot [pes planus] (acquired), right foot: Secondary | ICD-10-CM

## 2023-08-10 DIAGNOSIS — E1142 Type 2 diabetes mellitus with diabetic polyneuropathy: Secondary | ICD-10-CM

## 2023-08-10 DIAGNOSIS — L84 Corns and callosities: Secondary | ICD-10-CM

## 2023-08-10 DIAGNOSIS — M21961 Unspecified acquired deformity of right lower leg: Secondary | ICD-10-CM

## 2023-08-10 NOTE — Progress Notes (Signed)
 Patient presents to the office today for diabetic shoe and insole measuring.  Patient was measured with brannock device to determine size and width for 1 pair of extra depth shoes and foam casted for 3 pair of insoles.   Documentation of medical necessity will be sent to patient's treating diabetic doctor to verify and sign.   Patient's diabetic provider: Tanda Bame  Shoes and insoles will be ordered at that time and patient will be notified for an appointment for fitting when they arrive.  6 Right inserts ordered as patient has Amputation of Left LE / BKA  Shoe size (per patient): 13 Brannock measurement: 13 Patient shoe selection- Shoe choice:   653 Shoe size ordered: [id number] ABN signed

## 2023-09-29 ENCOUNTER — Ambulatory Visit: Payer: No Typology Code available for payment source | Admitting: Podiatry

## 2023-09-29 DIAGNOSIS — D2371 Other benign neoplasm of skin of right lower limb, including hip: Secondary | ICD-10-CM

## 2023-09-29 DIAGNOSIS — M2142 Flat foot [pes planus] (acquired), left foot: Secondary | ICD-10-CM

## 2023-09-29 DIAGNOSIS — B351 Tinea unguium: Secondary | ICD-10-CM

## 2023-09-29 DIAGNOSIS — L97512 Non-pressure chronic ulcer of other part of right foot with fat layer exposed: Secondary | ICD-10-CM

## 2023-09-29 DIAGNOSIS — M79676 Pain in unspecified toe(s): Secondary | ICD-10-CM | POA: Diagnosis not present

## 2023-09-29 DIAGNOSIS — M2011 Hallux valgus (acquired), right foot: Secondary | ICD-10-CM | POA: Diagnosis not present

## 2023-09-29 DIAGNOSIS — E1142 Type 2 diabetes mellitus with diabetic polyneuropathy: Secondary | ICD-10-CM | POA: Diagnosis not present

## 2023-09-29 DIAGNOSIS — M2141 Flat foot [pes planus] (acquired), right foot: Secondary | ICD-10-CM

## 2023-09-29 NOTE — Progress Notes (Unsigned)

## 2023-12-31 ENCOUNTER — Ambulatory Visit: Admitting: Podiatry

## 2023-12-31 DIAGNOSIS — D2371 Other benign neoplasm of skin of right lower limb, including hip: Secondary | ICD-10-CM

## 2023-12-31 DIAGNOSIS — Z89432 Acquired absence of left foot: Secondary | ICD-10-CM

## 2023-12-31 DIAGNOSIS — B351 Tinea unguium: Secondary | ICD-10-CM

## 2023-12-31 DIAGNOSIS — M79676 Pain in unspecified toe(s): Secondary | ICD-10-CM

## 2024-01-04 NOTE — Progress Notes (Signed)
 He presents today for routine nail and callus debridement.  He is diabetic footcare is due.  He states that his blood sugar today was 143 but his A1c is under pretty decent control.  He states that he is wearing his new diabetic shoe to his right foot below-knee amputation on the left.  Objective: Vital signs are stable alert oriented x 3.  Pulses are palpable.  Reactive hyperkeratotic lesion plantar aspect of the hallux no open skin no broken down tissue.  No signs of infection.  Otherwise nails are thick yellow dystrophic like mycotic.  Assessment: Diabetes mellitus history of amputation left onychomycosis and benign skin lesion right.  Plan: Debrided benign skin lesion debrided mycotic nails.  Follow-up with him in 3 months

## 2024-02-25 ENCOUNTER — Encounter: Payer: Self-pay | Admitting: Orthopedic Surgery

## 2024-02-25 ENCOUNTER — Ambulatory Visit (HOSPITAL_COMMUNITY)

## 2024-02-25 ENCOUNTER — Ambulatory Visit (INDEPENDENT_AMBULATORY_CARE_PROVIDER_SITE_OTHER): Admitting: Orthopedic Surgery

## 2024-02-25 DIAGNOSIS — L97511 Non-pressure chronic ulcer of other part of right foot limited to breakdown of skin: Secondary | ICD-10-CM

## 2024-02-25 NOTE — Progress Notes (Signed)
 Office Visit Note   Patient: Troy [name]           Date of Birth: 27-Jul-1966           MRN: [id number] Visit Date: 02/25/2024              Requested by: Rexanne Ingle, MD [address] [place],  [place] [address] PCP: Rexanne Ingle, MD  Chief Complaint  Patient presents with   Right Foot - Pain      HPI: Patient is a 58 year old gentleman who is seen for initial evaluation for a right foot plantar Wagner grade 1 ulcer.  Patient has a stable left transtibial amputation.  Assessment & Plan: Visit Diagnoses:  1. Right foot ulcer, limited to breakdown of skin (HCC)     Plan: The ulcer was debrided of skin and soft tissue.  A felt relieving donut was placed underneath the custom orthotic.  Follow-Up Instructions: Return in about 4 weeks (around 03/24/2024).   Ortho Exam  Patient is alert, oriented, no adenopathy, well-dressed, normal affect, normal respiratory effort. Examination patient has palpable pulses.  He has a Wagner grade 1 ulcer beneath the second metatarsal head.  After informed consent a 10 blade knife was used to debride the skin and soft tissue back to bleeding viable granulation tissue.  The wound was 2 cm in diameter after debridement.  There is healthy granulation tissue at the base with no exposed bone or tendon no tunneling.    Imaging: No results found. No images are attached to the encounter.  Labs: Lab Results  Component Value Date   HGBA1C 6.5 (H) 02/02/2016   HGBA1C 7.0 (H) 11/09/2013   HGBA1C 11.0 (H) 07/19/2013   LABURIC 6.5 02/06/2016   REPTSTATUS 02/03/2016 FINAL 02/02/2016   CULT NO GROWTH 02/02/2016     Lab Results  Component Value Date   ALBUMIN 3.8 10/19/2022   ALBUMIN 3.9 06/15/2022   ALBUMIN 4.0 06/14/2022    No results found for: MG No results found for: VD25OH  No results found for: PREALBUMIN    Latest Ref Rng & Units 10/27/2022    8:28 PM 10/19/2022    7:33 PM 06/15/2022   12:09 PM  CBC  EXTENDED  WBC 4.0 - 10.5 K/uL 9.0  8.7  7.0   RBC 4.22 - 5.81 MIL/uL 4.83  5.28  5.45   Hemoglobin 13.0 - 17.0 g/dL 85.4  84.0  83.6   HCT 39.0 - 52.0 % 42.0  45.8  47.4   Platelets 150 - 400 K/uL 167  169  157   NEUT# 1.7 - 7.7 K/uL   3.2   Lymph# 0.7 - 4.0 K/uL   3.0      There is no height or weight on file to calculate BMI.  Orders:  No orders of the defined types were placed in this encounter.  No orders of the defined types were placed in this encounter.    Procedures: No procedures performed  Clinical Data: No additional findings.  ROS:  All other systems negative, except as noted in the HPI. Review of Systems  Objective: Vital Signs: There were no vitals taken for this visit.  Specialty Comments:  No specialty comments available.  PMFS History: Patient Active Problem List   Diagnosis Date Noted   Acute deep vein thrombosis (DVT) of tibial vein of right lower extremity (HCC) 03/11/2023   History of colonic polyps 04/23/2021   Gout 09/25/2020   Morbid obesity (HCC) 09/25/2020  Painful amputation stump (HCC) 07/01/2019   Hyperlipidemia 09/07/2018   Diabetic foot ulcer (HCC) 09/07/2018   History of left below knee amputation (HCC) 03/26/2018   Dysphagia 09/01/2017   Special screening for malignant neoplasms, colon 09/01/2017   Esophageal reflux 09/01/2017   Acute osteomyelitis of left foot (HCC)    Diabetic foot (HCC) 02/02/2016   Osteomyelitis (HCC) 02/02/2016   Acute osteomyelitis of left foot (HCC) 02/02/2016   CKD (chronic kidney disease), stage II 02/02/2016   Anemia 02/02/2016   Kidney stones 11/30/2013   Cholelithiasis: PER CT ABD/PELVIS AND ABD US  11/30/13 11/30/2013   Acute renal failure (HCC) 11/29/2013   Acute pancreatitis 11/29/2013   Asthma exacerbation 11/09/2013   CAP (community acquired pneumonia) 07/18/2013   Diabetes (HCC) 07/18/2013   Hypertension    Past Medical History:  Diagnosis Date   Asthma    Bronchitis    CKD (chronic  kidney disease)    Diabetes mellitus    Diabetic Charcot foot (HCC)    Left   Gout    Hypertension    Kidney stones 11/30/2013   Neuromuscular disorder (HCC)    Obesity    Pancreatitis    Polyneuropathy in diabetes (HCC)    Sleep apnea     Family History  Problem Relation Age of Onset   Diabetes Mellitus II Father    Diabetes Father    Hypertension Father    Diabetes Mother    Hypertension Mother    CAD Neg Hx     Past Surgical History:  Procedure Laterality Date   AMPUTATION Left 02/06/2016   Procedure: AMPUTATION BELOW KNEE;  Surgeon: Oneil JAYSON Herald, MD;  Location: [facility];  Service: Orthopedics;  Laterality: Left;   CHOLECYSTECTOMY N/A 12/03/2013   Procedure: LAPAROSCOPIC CHOLECYSTECTOMY WITH INTRAOPERATIVE CHOLANGIOGRAM;  Surgeon: Donnice Bury, MD;  Location: [facility];  Service: General;  Laterality: N/A;   COLONOSCOPY WITH PROPOFOL  N/A 09/01/2017   Procedure: COLONOSCOPY WITH PROPOFOL ;  Surgeon: Dianna Specking, MD;  Location: [facility];  Service: Endoscopy;  Laterality: N/A;   COLONOSCOPY WITH PROPOFOL  N/A 04/23/2021   Procedure: COLONOSCOPY WITH PROPOFOL ;  Surgeon: Dianna Specking, MD;  Location: [facility];  Service: Endoscopy;  Laterality: N/A;   ESOPHAGOGASTRODUODENOSCOPY (EGD) WITH PROPOFOL  N/A 09/01/2017   Procedure: ESOPHAGOGASTRODUODENOSCOPY (EGD) WITH PROPOFOL ;  Surgeon: Dianna Specking, MD;  Location: [facility];  Service: Endoscopy;  Laterality: N/A;   NO PAST SURGERIES     POLYPECTOMY  04/23/2021   Procedure: POLYPECTOMY;  Surgeon: Dianna Specking, MD;  Location: [facility];  Service: Endoscopy;;   Social History   Occupational History   Not on file  Tobacco Use   Smoking status: Never   Smokeless tobacco: Never  Vaping Use   Vaping status: Never Used  Substance and Sexual Activity   Alcohol use: Yes    Comment: occasionally   Drug use: No   Sexual activity: Not on file

## 2024-03-02 ENCOUNTER — Telehealth: Payer: Self-pay | Admitting: Family

## 2024-03-02 NOTE — Telephone Encounter (Signed)
 Pt is asking for a call from [facility] sometime today. Pt states Dr Harden cut a callus off his foot and pt's wife states it black. I offered pt 3:30 pm appt and he states he is unsure if he can make it in and and asking for a call back. Please call pt at [phone].

## 2024-03-24 ENCOUNTER — Ambulatory Visit (INDEPENDENT_AMBULATORY_CARE_PROVIDER_SITE_OTHER): Admitting: Orthopedic Surgery

## 2024-03-24 ENCOUNTER — Encounter: Payer: Self-pay | Admitting: Orthopedic Surgery

## 2024-03-24 DIAGNOSIS — L97511 Non-pressure chronic ulcer of other part of right foot limited to breakdown of skin: Secondary | ICD-10-CM

## 2024-03-24 DIAGNOSIS — B351 Tinea unguium: Secondary | ICD-10-CM | POA: Diagnosis not present

## 2024-03-24 NOTE — Progress Notes (Signed)
 Office Visit Note   Patient: Troy [name]           Date of Birth: 12-26-1965           MRN: [id number] Visit Date: 03/24/2024              Requested by: Rexanne Ingle, MD [address] [place],  [place] [address] PCP: Rexanne Ingle, MD  Chief Complaint  Patient presents with   Right Foot - Wound Check      HPI: Discussed the use of AI scribe software for clinical note transcription with the patient, who gave verbal consent to proceed.  History of Present Illness Troy [name] is a 58 year old male who presents for follow-up of a foot ulcer under the second metatarsal head.  He notes significant improvement in the healing of his foot ulcer under the second metatarsal head, with the area almost healed. He attributes this improvement to the use of a felt insert under the ulcer, which has helped relieve pressure. His wife had previously expressed concern about the appearance of the ulcer, noting it looked darker, but it was later confirmed to be a scab. A previous doctor also assessed the ulcer and found it to be healing well.  He continues to use a 'donut' under the ulcer to alleviate pressure and reports that it feels much better when walking. He inquires about the need to keep the ulcer covered, noting that he has observed no drainage recently.  He discusses the need for prosthetic underliners for his leg and seeks advice on the appropriate size to order. Additionally, he inquires about routine foot care, including toenail trimming.     Assessment & Plan: Visit Diagnoses:  1. Right foot ulcer, limited to breakdown of skin (HCC)   2. Onychomycosis     Plan: Assessment and Plan Assessment & Plan Healing left foot ulcer beneath second metatarsal head Ulcer healing well, measuring 2 cm by 2 cm. No redness, cellulitis, or ulceration over great toe MTP joint. Darkness likely from previous silver  application. Felt donut insert effectively reducing  pressure. - Continue felt donut insert to offload pressure. - Trim scabs or excess tissue as needed. - Keep area covered until no drainage. - Follow-up in four weeks.      Follow-Up Instructions: Return in about 4 weeks (around 04/21/2024).   Ortho Exam  Patient is alert, oriented, no adenopathy, well-dressed, normal affect, normal respiratory effort. Physical Exam EXTREMITIES: Dorsalis pedis pulse intact. SKIN: No redness or cellulitis. No ulcer over the great toe MTP joint. Area trimmed is 2 cm by 2 cm and looks perfect.      Imaging: No results found. No images are attached to the encounter.  Labs: Lab Results  Component Value Date   HGBA1C 6.5 (H) 02/02/2016   HGBA1C 7.0 (H) 11/09/2013   HGBA1C 11.0 (H) 07/19/2013   LABURIC 6.5 02/06/2016   REPTSTATUS 02/03/2016 FINAL 02/02/2016   CULT NO GROWTH 02/02/2016     Lab Results  Component Value Date   ALBUMIN 3.8 10/19/2022   ALBUMIN 3.9 06/15/2022   ALBUMIN 4.0 06/14/2022    No results found for: MG No results found for: VD25OH  No results found for: PREALBUMIN    Latest Ref Rng & Units 10/27/2022    8:28 PM 10/19/2022    7:33 PM 06/15/2022   12:09 PM  CBC EXTENDED  WBC 4.0 - 10.5 K/uL 9.0  8.7  7.0   RBC 4.22 -  5.81 MIL/uL 4.83  5.28  5.45   Hemoglobin 13.0 - 17.0 g/dL 85.4  84.0  83.6   HCT 39.0 - 52.0 % 42.0  45.8  47.4   Platelets 150 - 400 K/uL 167  169  157   NEUT# 1.7 - 7.7 K/uL   3.2   Lymph# 0.7 - 4.0 K/uL   3.0      There is no height or weight on file to calculate BMI.  Orders:  No orders of the defined types were placed in this encounter.  No orders of the defined types were placed in this encounter.    Procedures: No procedures performed  Clinical Data: No additional findings.  ROS:  All other systems negative, except as noted in the HPI. Review of Systems  Objective: Vital Signs: There were no vitals taken for this visit.  Specialty Comments:  No specialty comments  available.  PMFS History: Patient Active Problem List   Diagnosis Date Noted   Acute deep vein thrombosis (DVT) of tibial vein of right lower extremity (HCC) 03/11/2023   History of colonic polyps 04/23/2021   Gout 09/25/2020   Morbid obesity (HCC) 09/25/2020   Painful amputation stump (HCC) 07/01/2019   Hyperlipidemia 09/07/2018   Diabetic foot ulcer (HCC) 09/07/2018   History of left below knee amputation (HCC) 03/26/2018   Dysphagia 09/01/2017   Special screening for malignant neoplasms, colon 09/01/2017   Esophageal reflux 09/01/2017   Acute osteomyelitis of left foot (HCC)    Diabetic foot (HCC) 02/02/2016   Osteomyelitis (HCC) 02/02/2016   Acute osteomyelitis of left foot (HCC) 02/02/2016   CKD (chronic kidney disease), stage II 02/02/2016   Anemia 02/02/2016   Kidney stones 11/30/2013   Cholelithiasis: PER CT ABD/PELVIS AND ABD US  11/30/13 11/30/2013   Acute renal failure (HCC) 11/29/2013   Acute pancreatitis 11/29/2013   Asthma exacerbation 11/09/2013   CAP (community acquired pneumonia) 07/18/2013   Diabetes (HCC) 07/18/2013   Hypertension    Past Medical History:  Diagnosis Date   Asthma    Bronchitis    CKD (chronic kidney disease)    Diabetes mellitus    Diabetic Charcot foot (HCC)    Left   Gout    Hypertension    Kidney stones 11/30/2013   Neuromuscular disorder (HCC)    Obesity    Pancreatitis    Polyneuropathy in diabetes (HCC)    Sleep apnea     Family History  Problem Relation Age of Onset   Diabetes Mellitus II Father    Diabetes Father    Hypertension Father    Diabetes Mother    Hypertension Mother    CAD Neg Hx     Past Surgical History:  Procedure Laterality Date   AMPUTATION Left 02/06/2016   Procedure: AMPUTATION BELOW KNEE;  Surgeon: Oneil JAYSON Herald, MD;  Location: [facility];  Service: Orthopedics;  Laterality: Left;   CHOLECYSTECTOMY N/A 12/03/2013   Procedure: LAPAROSCOPIC CHOLECYSTECTOMY WITH INTRAOPERATIVE CHOLANGIOGRAM;  Surgeon:  Donnice Bury, MD;  Location: [facility];  Service: General;  Laterality: N/A;   COLONOSCOPY WITH PROPOFOL  N/A 09/01/2017   Procedure: COLONOSCOPY WITH PROPOFOL ;  Surgeon: Dianna Specking, MD;  Location: [facility];  Service: Endoscopy;  Laterality: N/A;   COLONOSCOPY WITH PROPOFOL  N/A 04/23/2021   Procedure: COLONOSCOPY WITH PROPOFOL ;  Surgeon: Dianna Specking, MD;  Location: [facility];  Service: Endoscopy;  Laterality: N/A;   ESOPHAGOGASTRODUODENOSCOPY (EGD) WITH PROPOFOL  N/A 09/01/2017   Procedure: ESOPHAGOGASTRODUODENOSCOPY (EGD) WITH PROPOFOL ;  Surgeon: Dianna Specking, MD;  Location: [facility];  Service: Endoscopy;  Laterality: N/A;   NO PAST SURGERIES     POLYPECTOMY  04/23/2021   Procedure: POLYPECTOMY;  Surgeon: Dianna Specking, MD;  Location: [facility];  Service: Endoscopy;;   Social History   Occupational History   Not on file  Tobacco Use   Smoking status: Never   Smokeless tobacco: Never  Vaping Use   Vaping status: Never Used  Substance and Sexual Activity   Alcohol use: Yes    Comment: occasionally   Drug use: No   Sexual activity: Not on file

## 2024-03-31 ENCOUNTER — Ambulatory Visit: Admitting: Podiatry

## 2024-04-21 ENCOUNTER — Ambulatory Visit: Admitting: Orthopedic Surgery

## 2024-04-25 ENCOUNTER — Ambulatory Visit: Admitting: Orthopedic Surgery

## 2024-04-25 DIAGNOSIS — L97511 Non-pressure chronic ulcer of other part of right foot limited to breakdown of skin: Secondary | ICD-10-CM | POA: Diagnosis not present

## 2024-04-25 DIAGNOSIS — B351 Tinea unguium: Secondary | ICD-10-CM | POA: Diagnosis not present

## 2024-04-25 DIAGNOSIS — L84 Corns and callosities: Secondary | ICD-10-CM

## 2024-04-26 ENCOUNTER — Encounter: Payer: Self-pay | Admitting: Orthopedic Surgery

## 2024-04-26 NOTE — Progress Notes (Signed)
 Office Visit Note   Patient: Troy [name]           Date of Birth: 1965/09/14           MRN: [id number] Visit Date: 04/25/2024              Requested by: Rexanne Ingle, MD [address] [place],  [place] [address] PCP: Rexanne Ingle, MD  Chief Complaint  Patient presents with   Right Foot - Follow-up      HPI: Discussed the use of AI scribe software for clinical note transcription with the patient, who gave verbal consent to proceed.  History of Present Illness Troy [name] is a 58 year old male who presents with a Wagner grade one ulcer beneath the second metatarsal head.  He has a Wagner grade one ulcer located beneath the second metatarsal head of the right foot. He has new orthotics designed to relieve pressure beneath the second metatarsal head, and he finds the pressure relief from the plantar aspect of the orthotic and the applied relieving donors effective.  He has a stable transtibial amputation on the left side with no current issues or complications noted at the amputation site.     Assessment & Plan: Visit Diagnoses:  1. Right foot ulcer, limited to breakdown of skin (HCC)   2. Onychomycosis   3. Callus of foot     Plan: Assessment and Plan Assessment & Plan Right foot chronic ulcer beneath second metatarsal head, post-debridement Ulcer beneath the second metatarsal head, Wagner grade one, post-debridement. Ulcer measures two centimeters, no infection, redness, or cellulitis. Good capillary refill. - Apply new orthotics with pressure relief beneath the second metatarsal head. - Apply relieving donors to the plantar aspect of the orthotic. - Follow up in two months.      Follow-Up Instructions: Return in about 2 months (around 06/25/2024).   Ortho Exam  Patient is alert, oriented, no adenopathy, well-dressed, normal affect, normal respiratory effort. Physical Exam CARDIOVASCULAR: Good capillary refill. EXTREMITIES: Stable  transtibular amputation on the left. Wagner grade one ulcer beneath right second metatarsal head, two centimeters in diameter after debridement. No redness, cellulitis, or signs of infection.  After informed consent a 10 blade knife was used to debride the skin and soft tissue back to healthy viable granulation tissue.  No exposed bone or tendon.  No abscess.      Imaging: No results found. No images are attached to the encounter.  Labs: Lab Results  Component Value Date   HGBA1C 6.5 (H) 02/02/2016   HGBA1C 7.0 (H) 11/09/2013   HGBA1C 11.0 (H) 07/19/2013   LABURIC 6.5 02/06/2016   REPTSTATUS 02/03/2016 FINAL 02/02/2016   CULT NO GROWTH 02/02/2016     Lab Results  Component Value Date   ALBUMIN 3.8 10/19/2022   ALBUMIN 3.9 06/15/2022   ALBUMIN 4.0 06/14/2022    No results found for: MG No results found for: VD25OH  No results found for: PREALBUMIN    Latest Ref Rng & Units 10/27/2022    8:28 PM 10/19/2022    7:33 PM 06/15/2022   12:09 PM  CBC EXTENDED  WBC 4.0 - 10.5 K/uL 9.0  8.7  7.0   RBC 4.22 - 5.81 MIL/uL 4.83  5.28  5.45   Hemoglobin 13.0 - 17.0 g/dL 85.4  84.0  83.6   HCT 39.0 - 52.0 % 42.0  45.8  47.4   Platelets 150 - 400 K/uL 167  169  157  NEUT# 1.7 - 7.7 K/uL   3.2   Lymph# 0.7 - 4.0 K/uL   3.0      There is no height or weight on file to calculate BMI.  Orders:  No orders of the defined types were placed in this encounter.  No orders of the defined types were placed in this encounter.    Procedures: No procedures performed  Clinical Data: No additional findings.  ROS:  All other systems negative, except as noted in the HPI. Review of Systems  Objective: Vital Signs: There were no vitals taken for this visit.  Specialty Comments:  No specialty comments available.  PMFS History: Patient Active Problem List   Diagnosis Date Noted   Acute deep vein thrombosis (DVT) of tibial vein of right lower extremity (HCC) 03/11/2023    History of colonic polyps 04/23/2021   Gout 09/25/2020   Morbid obesity (HCC) 09/25/2020   Painful amputation stump (HCC) 07/01/2019   Hyperlipidemia 09/07/2018   Diabetic foot ulcer (HCC) 09/07/2018   History of left below knee amputation (HCC) 03/26/2018   Dysphagia 09/01/2017   Special screening for malignant neoplasms, colon 09/01/2017   Esophageal reflux 09/01/2017   Acute osteomyelitis of left foot (HCC)    Diabetic foot (HCC) 02/02/2016   Osteomyelitis (HCC) 02/02/2016   Acute osteomyelitis of left foot (HCC) 02/02/2016   CKD (chronic kidney disease), stage II 02/02/2016   Anemia 02/02/2016   Kidney stones 11/30/2013   Cholelithiasis: PER CT ABD/PELVIS AND ABD US  11/30/13 11/30/2013   Acute renal failure (HCC) 11/29/2013   Acute pancreatitis 11/29/2013   Asthma exacerbation 11/09/2013   CAP (community acquired pneumonia) 07/18/2013   Diabetes (HCC) 07/18/2013   Hypertension    Past Medical History:  Diagnosis Date   Asthma    Bronchitis    CKD (chronic kidney disease)    Diabetes mellitus    Diabetic Charcot foot (HCC)    Left   Gout    Hypertension    Kidney stones 11/30/2013   Neuromuscular disorder (HCC)    Obesity    Pancreatitis    Polyneuropathy in diabetes (HCC)    Sleep apnea     Family History  Problem Relation Age of Onset   Diabetes Mellitus II Father    Diabetes Father    Hypertension Father    Diabetes Mother    Hypertension Mother    CAD Neg Hx     Past Surgical History:  Procedure Laterality Date   AMPUTATION Left 02/06/2016   Procedure: AMPUTATION BELOW KNEE;  Surgeon: Oneil JAYSON Herald, MD;  Location: [facility];  Service: Orthopedics;  Laterality: Left;   CHOLECYSTECTOMY N/A 12/03/2013   Procedure: LAPAROSCOPIC CHOLECYSTECTOMY WITH INTRAOPERATIVE CHOLANGIOGRAM;  Surgeon: Donnice Bury, MD;  Location: [facility];  Service: General;  Laterality: N/A;   COLONOSCOPY WITH PROPOFOL  N/A 09/01/2017   Procedure: COLONOSCOPY WITH PROPOFOL ;  Surgeon: Dianna Specking, MD;  Location: [facility];  Service: Endoscopy;  Laterality: N/A;   COLONOSCOPY WITH PROPOFOL  N/A 04/23/2021   Procedure: COLONOSCOPY WITH PROPOFOL ;  Surgeon: Dianna Specking, MD;  Location: [facility];  Service: Endoscopy;  Laterality: N/A;   ESOPHAGOGASTRODUODENOSCOPY (EGD) WITH PROPOFOL  N/A 09/01/2017   Procedure: ESOPHAGOGASTRODUODENOSCOPY (EGD) WITH PROPOFOL ;  Surgeon: Dianna Specking, MD;  Location: [facility];  Service: Endoscopy;  Laterality: N/A;   NO PAST SURGERIES     POLYPECTOMY  04/23/2021   Procedure: POLYPECTOMY;  Surgeon: Dianna Specking, MD;  Location: [facility];  Service: Endoscopy;;   Social History   Occupational  History   Not on file  Tobacco Use   Smoking status: Never   Smokeless tobacco: Never  Vaping Use   Vaping status: Never Used  Substance and Sexual Activity   Alcohol use: Yes    Comment: occasionally   Drug use: No   Sexual activity: Not on file

## 2024-05-30 ENCOUNTER — Encounter: Payer: Self-pay | Admitting: Radiology

## 2024-06-27 ENCOUNTER — Other Ambulatory Visit: Payer: Self-pay

## 2024-06-27 ENCOUNTER — Ambulatory Visit: Admitting: Physician Assistant

## 2024-06-27 ENCOUNTER — Encounter: Payer: Self-pay | Admitting: Physician Assistant

## 2024-06-27 DIAGNOSIS — L97511 Non-pressure chronic ulcer of other part of right foot limited to breakdown of skin: Secondary | ICD-10-CM

## 2024-06-27 MED ORDER — DOXYCYCLINE HYCLATE 100 MG PO TABS: 100.0000 mg | ORAL_TABLET | Freq: Two times a day (BID) | ORAL | 0 refills | Status: AC

## 2024-06-27 NOTE — Progress Notes (Signed)
 Office Visit Note   Patient: Troy [name]           Date of Birth: 1966-01-31           MRN: [id number] Visit Date: 06/27/2024              Requested by: Rexanne Ingle, MD [address] [place],  [place] [address] PCP: Rexanne Ingle, MD  Chief Complaint  Patient presents with   Right Foot - Wound Check      HPI: 58 y/o male with right plantar callus, right first MTP open wound after blister formation a week ago.  He states he has had diabetic foot wear that wore out and has now been wearing [employer] shoes with orthotics.  He has a history of left BKA without issues.  The BKA was performed secondary to left foot osteomyelitis with non healing ulcer.  Past medical history includes DM with an A1c 6.2.    Assessment & Plan: Visit Diagnoses:  1. Right foot ulcer, limited to breakdown of skin (HCC)     Plan: I placed him on Doxycycline  BID for 2 weeks.  He will do Vashe wet to dry packing dressing changes.  He may shower with soap and water.  If he develops drainage, erythema, fever or chills he will call our office sooner.    Follow-Up Instructions: Return in about 2 weeks (around 07/11/2024).   Ortho Exam  Patient is alert, oriented, no adenopathy, well-dressed, normal affect, normal respiratory effort. Right foot is warm without ischemic changes.  No gangrene.  Right plantar second metatarsal head area callus.  After verbal consent the callus was debrided with a 10 blade.    The right first MTP joint blister was debrided.  There is malodor, no cellulitis or purulent drainage.  The wound measures 1 cm x 1 cm with a depth of 5 mm.    Right foot x ray revels calcified vessels.  Significant arthritic changes to the MTP joints, and tarsal foot area.  No bone destruction was noted in the area of the first MTP joint.    Imaging: No results found.     Labs: Lab Results  Component Value Date   HGBA1C 6.5 (H) 02/02/2016   HGBA1C 7.0 (H) 11/09/2013   HGBA1C  11.0 (H) 07/19/2013   LABURIC 6.5 02/06/2016   REPTSTATUS 02/03/2016 FINAL 02/02/2016   CULT NO GROWTH 02/02/2016     Lab Results  Component Value Date   ALBUMIN 3.8 10/19/2022   ALBUMIN 3.9 06/15/2022   ALBUMIN 4.0 06/14/2022    No results found for: MG No results found for: VD25OH  No results found for: PREALBUMIN    Latest Ref Rng & Units 10/27/2022    8:28 PM 10/19/2022    7:33 PM 06/15/2022   12:09 PM  CBC EXTENDED  WBC 4.0 - 10.5 K/uL 9.0  8.7  7.0   RBC 4.22 - 5.81 MIL/uL 4.83  5.28  5.45   Hemoglobin 13.0 - 17.0 g/dL 85.4  84.0  83.6   HCT 39.0 - 52.0 % 42.0  45.8  47.4   Platelets 150 - 400 K/uL 167  169  157   NEUT# 1.7 - 7.7 K/uL   3.2   Lymph# 0.7 - 4.0 K/uL   3.0      There is no height or weight on file to calculate BMI.  Orders:  Orders Placed This Encounter  Procedures   XR Foot Complete Right  Meds ordered this encounter  Medications   doxycycline  (VIBRA -TABS) 100 MG tablet    Sig: Take 1 tablet (100 mg total) by mouth 2 (two) times daily.    Dispense:  28 tablet    Refill:  0    Supervising Provider:   DUDA, MARCUS V [[id number]]     Procedures: No procedures performed  Clinical Data: No additional findings.  ROS:  All other systems negative, except as noted in the HPI. Review of Systems  Objective: Vital Signs: There were no vitals taken for this visit.  Specialty Comments:  No specialty comments available.  PMFS History: Patient Active Problem List   Diagnosis Date Noted   Acute deep vein thrombosis (DVT) of tibial vein of right lower extremity (HCC) 03/11/2023   History of colonic polyps 04/23/2021   Gout 09/25/2020   Morbid obesity (HCC) 09/25/2020   Painful amputation stump (HCC) 07/01/2019   Hyperlipidemia 09/07/2018   Diabetic foot ulcer (HCC) 09/07/2018   History of left below knee amputation (HCC) 03/26/2018   Dysphagia 09/01/2017   Special screening for malignant neoplasms, colon 09/01/2017   Esophageal reflux  09/01/2017   Acute osteomyelitis of left foot (HCC)    Diabetic foot (HCC) 02/02/2016   Osteomyelitis (HCC) 02/02/2016   Acute osteomyelitis of left foot (HCC) 02/02/2016   CKD (chronic kidney disease), stage II 02/02/2016   Anemia 02/02/2016   Kidney stones 11/30/2013   Cholelithiasis: PER CT ABD/PELVIS AND ABD US  11/30/13 11/30/2013   Acute renal failure (HCC) 11/29/2013   Acute pancreatitis 11/29/2013   Asthma exacerbation 11/09/2013   CAP (community acquired pneumonia) 07/18/2013   Diabetes (HCC) 07/18/2013   Hypertension    Past Medical History:  Diagnosis Date   Asthma    Bronchitis    CKD (chronic kidney disease)    Diabetes mellitus    Diabetic Charcot foot (HCC)    Left   Gout    Hypertension    Kidney stones 11/30/2013   Neuromuscular disorder (HCC)    Obesity    Pancreatitis    Polyneuropathy in diabetes (HCC)    Sleep apnea     Family History  Problem Relation Age of Onset   Diabetes Mellitus II Father    Diabetes Father    Hypertension Father    Diabetes Mother    Hypertension Mother    CAD Neg Hx     Past Surgical History:  Procedure Laterality Date   AMPUTATION Left 02/06/2016   Procedure: AMPUTATION BELOW KNEE;  Surgeon: Oneil JAYSON Herald, MD;  Location: [facility];  Service: Orthopedics;  Laterality: Left;   CHOLECYSTECTOMY N/A 12/03/2013   Procedure: LAPAROSCOPIC CHOLECYSTECTOMY WITH INTRAOPERATIVE CHOLANGIOGRAM;  Surgeon: Donnice Bury, MD;  Location: [facility];  Service: General;  Laterality: N/A;   COLONOSCOPY WITH PROPOFOL  N/A 09/01/2017   Procedure: COLONOSCOPY WITH PROPOFOL ;  Surgeon: Dianna Specking, MD;  Location: [facility];  Service: Endoscopy;  Laterality: N/A;   COLONOSCOPY WITH PROPOFOL  N/A 04/23/2021   Procedure: COLONOSCOPY WITH PROPOFOL ;  Surgeon: Dianna Specking, MD;  Location: [facility];  Service: Endoscopy;  Laterality: N/A;   ESOPHAGOGASTRODUODENOSCOPY (EGD) WITH PROPOFOL  N/A 09/01/2017   Procedure: ESOPHAGOGASTRODUODENOSCOPY (EGD) WITH  PROPOFOL ;  Surgeon: Dianna Specking, MD;  Location: [facility];  Service: Endoscopy;  Laterality: N/A;   NO PAST SURGERIES     POLYPECTOMY  04/23/2021   Procedure: POLYPECTOMY;  Surgeon: Dianna Specking, MD;  Location: [facility];  Service: Endoscopy;;   Social History   Occupational History   Not on file  Tobacco Use   Smoking status: Never   Smokeless tobacco: Never  Vaping Use   Vaping status: Never Used  Substance and Sexual Activity   Alcohol use: Yes    Comment: occasionally   Drug use: No   Sexual activity: Not on file

## 2024-07-11 ENCOUNTER — Encounter: Payer: Self-pay | Admitting: Physician Assistant

## 2024-07-11 ENCOUNTER — Ambulatory Visit: Admitting: Physician Assistant

## 2024-07-11 DIAGNOSIS — S91109A Unspecified open wound of unspecified toe(s) without damage to nail, initial encounter: Secondary | ICD-10-CM

## 2024-07-11 NOTE — Progress Notes (Signed)
 Office Visit Note   Patient: Troy [name]           Date of Birth: 04-07-1966           MRN: [id number] Visit Date: 07/11/2024              Requested by: Rexanne Ingle, MD [address] [place],  [place] [address] PCP: Rexanne Ingle, MD  Chief Complaint  Patient presents with   Right Foot - Wound Check      HPI: 58 y/o male with recent history of right plantar callus, right first MTP open wound after blister formation.  He denies fever and chills.  He was placed on oral antibiotics and Vashe wet to dry packing of the first MTP.     The wound is smaller and there is no redness or edema surrounding the GT.  Assessment & Plan: Visit Diagnoses:  1. Open toe wound, initial encounter     Plan: No cellulitis or purulent drainage.  He has finished his poral antibiotics.  Elevation above the heart supine is recommended with decrease edema.  He was given an handout to demonstrate.    Continue Vashe cleaning and wet to dry guaze with band aide over wound until healed.    Follow-Up Instructions: Return in about 2 weeks (around 07/25/2024).   Ortho Exam  Patient is alert, oriented, no adenopathy, well-dressed, normal affect, normal respiratory effort. Doppler signals biphasic DP/PT.  No cellulitis or edema in the GT.  The wound has filled in and is flat 3 mm x 5 mm.  New skin around the wound.  Cap refill <3 sec. Right GT.   No fluctuance.  Clear fluid drainage.   Imaging: No results found. No images are attached to the encounter.  Labs: Lab Results  Component Value Date   HGBA1C 6.5 (H) 02/02/2016   HGBA1C 7.0 (H) 11/09/2013   HGBA1C 11.0 (H) 07/19/2013   LABURIC 6.5 02/06/2016   REPTSTATUS 02/03/2016 FINAL 02/02/2016   CULT NO GROWTH 02/02/2016     Lab Results  Component Value Date   ALBUMIN 3.8 10/19/2022   ALBUMIN 3.9 06/15/2022   ALBUMIN 4.0 06/14/2022    No results found for: MG No results found for: VD25OH  No results found for:  PREALBUMIN    Latest Ref Rng & Units 10/27/2022    8:28 PM 10/19/2022    7:33 PM 06/15/2022   12:09 PM  CBC EXTENDED  WBC 4.0 - 10.5 K/uL 9.0  8.7  7.0   RBC 4.22 - 5.81 MIL/uL 4.83  5.28  5.45   Hemoglobin 13.0 - 17.0 g/dL 85.4  84.0  83.6   HCT 39.0 - 52.0 % 42.0  45.8  47.4   Platelets 150 - 400 K/uL 167  169  157   NEUT# 1.7 - 7.7 K/uL   3.2   Lymph# 0.7 - 4.0 K/uL   3.0      There is no height or weight on file to calculate BMI.  Orders:  No orders of the defined types were placed in this encounter.  No orders of the defined types were placed in this encounter.    Procedures: No procedures performed  Clinical Data: No additional findings.  ROS:  All other systems negative, except as noted in the HPI. Review of Systems  Objective: Vital Signs: There were no vitals taken for this visit.  Specialty Comments:  No specialty comments available.  PMFS History: Patient Active Problem List  Diagnosis Date Noted   Acute deep vein thrombosis (DVT) of tibial vein of right lower extremity (HCC) 03/11/2023   History of colonic polyps 04/23/2021   Gout 09/25/2020   Morbid obesity (HCC) 09/25/2020   Painful amputation stump (HCC) 07/01/2019   Hyperlipidemia 09/07/2018   Diabetic foot ulcer (HCC) 09/07/2018   History of left below knee amputation (HCC) 03/26/2018   Dysphagia 09/01/2017   Special screening for malignant neoplasms, colon 09/01/2017   Esophageal reflux 09/01/2017   Acute osteomyelitis of left foot (HCC)    Diabetic foot (HCC) 02/02/2016   Osteomyelitis (HCC) 02/02/2016   Acute osteomyelitis of left foot (HCC) 02/02/2016   CKD (chronic kidney disease), stage II 02/02/2016   Anemia 02/02/2016   Kidney stones 11/30/2013   Cholelithiasis: PER CT ABD/PELVIS AND ABD US  11/30/13 11/30/2013   Acute renal failure (HCC) 11/29/2013   Acute pancreatitis 11/29/2013   Asthma exacerbation 11/09/2013   CAP (community acquired pneumonia) 07/18/2013   Diabetes (HCC)  07/18/2013   Hypertension    Past Medical History:  Diagnosis Date   Asthma    Bronchitis    CKD (chronic kidney disease)    Diabetes mellitus    Diabetic Charcot foot (HCC)    Left   Gout    Hypertension    Kidney stones 11/30/2013   Neuromuscular disorder (HCC)    Obesity    Pancreatitis    Polyneuropathy in diabetes (HCC)    Sleep apnea     Family History  Problem Relation Age of Onset   Diabetes Mellitus II Father    Diabetes Father    Hypertension Father    Diabetes Mother    Hypertension Mother    CAD Neg Hx     Past Surgical History:  Procedure Laterality Date   AMPUTATION Left 02/06/2016   Procedure: AMPUTATION BELOW KNEE;  Surgeon: Oneil JAYSON Herald, MD;  Location: [facility];  Service: Orthopedics;  Laterality: Left;   CHOLECYSTECTOMY N/A 12/03/2013   Procedure: LAPAROSCOPIC CHOLECYSTECTOMY WITH INTRAOPERATIVE CHOLANGIOGRAM;  Surgeon: Donnice Bury, MD;  Location: [facility];  Service: General;  Laterality: N/A;   COLONOSCOPY WITH PROPOFOL  N/A 09/01/2017   Procedure: COLONOSCOPY WITH PROPOFOL ;  Surgeon: Dianna Specking, MD;  Location: [facility];  Service: Endoscopy;  Laterality: N/A;   COLONOSCOPY WITH PROPOFOL  N/A 04/23/2021   Procedure: COLONOSCOPY WITH PROPOFOL ;  Surgeon: Dianna Specking, MD;  Location: [facility];  Service: Endoscopy;  Laterality: N/A;   ESOPHAGOGASTRODUODENOSCOPY (EGD) WITH PROPOFOL  N/A 09/01/2017   Procedure: ESOPHAGOGASTRODUODENOSCOPY (EGD) WITH PROPOFOL ;  Surgeon: Dianna Specking, MD;  Location: [facility];  Service: Endoscopy;  Laterality: N/A;   NO PAST SURGERIES     POLYPECTOMY  04/23/2021   Procedure: POLYPECTOMY;  Surgeon: Dianna Specking, MD;  Location: [facility];  Service: Endoscopy;;   Social History   Occupational History   Not on file  Tobacco Use   Smoking status: Never   Smokeless tobacco: Never  Vaping Use   Vaping status: Never Used  Substance and Sexual Activity   Alcohol use: Yes    Comment: occasionally   Drug  use: No   Sexual activity: Not on file

## 2024-07-26 ENCOUNTER — Ambulatory Visit: Admitting: Family

## 2024-07-26 DIAGNOSIS — E08621 Diabetes mellitus due to underlying condition with foot ulcer: Secondary | ICD-10-CM | POA: Diagnosis not present

## 2024-07-26 DIAGNOSIS — L97401 Non-pressure chronic ulcer of unspecified heel and midfoot limited to breakdown of skin: Secondary | ICD-10-CM | POA: Diagnosis not present

## 2024-07-26 NOTE — Progress Notes (Signed)
 "  Office Visit Note   Patient: Troy [name]           Date of Birth: Feb 22, 1966           MRN: [id number] Visit Date: 07/26/2024              Requested by: Rexanne Ingle, MD [address] [place],  [place] [address] PCP: Rexanne Ingle, MD  Chief Complaint  Patient presents with   Right Foot - Wound Check      HPI: The patient is a 58 year old gentleman who presents in follow-up for ulceration to the right foot.  He has been using a Vashe wet-to-dry dressing changing this daily over his ulcer over the dorsal lateral MTP joint of the great toe, right foot.    He is pleased with his progress he has no new concerns.  Has changed his shoewear to some shoe wear with a wider toebox  Assessment & Plan: Visit Diagnoses: No diagnosis found.  Plan: will continue to cleanse with Vashe and use mupirocin  and band aid during day. Will discontinue the shoewear which was tight and caused the pressure/ulceration.  Follow-Up Instructions: No follow-ups on file.   Ortho Exam  Patient is alert, oriented, no adenopathy, well-dressed, normal affect, normal respiratory effort. On examination right foot over the dorsal lateral MTP joint of the right great toe there is superficial ulceration which is 5 mm in diameter with some surrounding maceration there is no active drainage no signs of infection    Imaging: No results found. No images are attached to the encounter.  Labs: Lab Results  Component Value Date   HGBA1C 6.5 (H) 02/02/2016   HGBA1C 7.0 (H) 11/09/2013   HGBA1C 11.0 (H) 07/19/2013   LABURIC 6.5 02/06/2016   REPTSTATUS 02/03/2016 FINAL 02/02/2016   CULT NO GROWTH 02/02/2016     Lab Results  Component Value Date   ALBUMIN 3.8 10/19/2022   ALBUMIN 3.9 06/15/2022   ALBUMIN 4.0 06/14/2022    No results found for: MG No results found for: VD25OH  No results found for: PREALBUMIN    Latest Ref Rng & Units 10/27/2022    8:28 PM 10/19/2022    7:33 PM  06/15/2022   12:09 PM  CBC EXTENDED  WBC 4.0 - 10.5 K/uL 9.0  8.7  7.0   RBC 4.22 - 5.81 MIL/uL 4.83  5.28  5.45   Hemoglobin 13.0 - 17.0 g/dL 85.4  84.0  83.6   HCT 39.0 - 52.0 % 42.0  45.8  47.4   Platelets 150 - 400 K/uL 167  169  157   NEUT# 1.7 - 7.7 K/uL   3.2   Lymph# 0.7 - 4.0 K/uL   3.0      There is no height or weight on file to calculate BMI.  Orders:  No orders of the defined types were placed in this encounter.  No orders of the defined types were placed in this encounter.    Procedures: No procedures performed  Clinical Data: No additional findings.  ROS:  All other systems negative, except as noted in the HPI. Review of Systems  Objective: Vital Signs: There were no vitals taken for this visit.  Specialty Comments:  No specialty comments available.  PMFS History: Patient Active Problem List   Diagnosis Date Noted   Acute deep vein thrombosis (DVT) of tibial vein of right lower extremity (HCC) 03/11/2023   History of colonic polyps 04/23/2021   Gout 09/25/2020  Morbid obesity (HCC) 09/25/2020   Painful amputation stump (HCC) 07/01/2019   Hyperlipidemia 09/07/2018   Diabetic foot ulcer (HCC) 09/07/2018   History of left below knee amputation (HCC) 03/26/2018   Dysphagia 09/01/2017   Special screening for malignant neoplasms, colon 09/01/2017   Esophageal reflux 09/01/2017   Acute osteomyelitis of left foot (HCC)    Diabetic foot (HCC) 02/02/2016   Osteomyelitis (HCC) 02/02/2016   Acute osteomyelitis of left foot (HCC) 02/02/2016   CKD (chronic kidney disease), stage II 02/02/2016   Anemia 02/02/2016   Kidney stones 11/30/2013   Cholelithiasis: PER CT ABD/PELVIS AND ABD US  11/30/13 11/30/2013   Acute renal failure (HCC) 11/29/2013   Acute pancreatitis 11/29/2013   Asthma exacerbation 11/09/2013   CAP (community acquired pneumonia) 07/18/2013   Diabetes (HCC) 07/18/2013   Hypertension    Past Medical History:  Diagnosis Date   Asthma     Bronchitis    CKD (chronic kidney disease)    Diabetes mellitus    Diabetic Charcot foot (HCC)    Left   Gout    Hypertension    Kidney stones 11/30/2013   Neuromuscular disorder (HCC)    Obesity    Pancreatitis    Polyneuropathy in diabetes (HCC)    Sleep apnea     Family History  Problem Relation Age of Onset   Diabetes Mellitus II Father    Diabetes Father    Hypertension Father    Diabetes Mother    Hypertension Mother    CAD Neg Hx     Past Surgical History:  Procedure Laterality Date   AMPUTATION Left 02/06/2016   Procedure: AMPUTATION BELOW KNEE;  Surgeon: Oneil JAYSON Herald, MD;  Location: [facility];  Service: Orthopedics;  Laterality: Left;   CHOLECYSTECTOMY N/A 12/03/2013   Procedure: LAPAROSCOPIC CHOLECYSTECTOMY WITH INTRAOPERATIVE CHOLANGIOGRAM;  Surgeon: Donnice Bury, MD;  Location: [facility];  Service: General;  Laterality: N/A;   COLONOSCOPY WITH PROPOFOL  N/A 09/01/2017   Procedure: COLONOSCOPY WITH PROPOFOL ;  Surgeon: Dianna Specking, MD;  Location: [facility];  Service: Endoscopy;  Laterality: N/A;   COLONOSCOPY WITH PROPOFOL  N/A 04/23/2021   Procedure: COLONOSCOPY WITH PROPOFOL ;  Surgeon: Dianna Specking, MD;  Location: [facility];  Service: Endoscopy;  Laterality: N/A;   ESOPHAGOGASTRODUODENOSCOPY (EGD) WITH PROPOFOL  N/A 09/01/2017   Procedure: ESOPHAGOGASTRODUODENOSCOPY (EGD) WITH PROPOFOL ;  Surgeon: Dianna Specking, MD;  Location: [facility];  Service: Endoscopy;  Laterality: N/A;   NO PAST SURGERIES     POLYPECTOMY  04/23/2021   Procedure: POLYPECTOMY;  Surgeon: Dianna Specking, MD;  Location: [facility];  Service: Endoscopy;;   Social History   Occupational History   Not on file  Tobacco Use   Smoking status: Never   Smokeless tobacco: Never  Vaping Use   Vaping status: Never Used  Substance and Sexual Activity   Alcohol use: Yes    Comment: occasionally   Drug use: No   Sexual activity: Not on file       "

## 2024-08-03 ENCOUNTER — Encounter: Payer: Self-pay | Admitting: Family

## 2024-08-16 ENCOUNTER — Telehealth: Payer: Self-pay | Admitting: Podiatry

## 2024-08-16 ENCOUNTER — Ambulatory Visit: Admitting: Family

## 2024-08-16 ENCOUNTER — Ambulatory Visit: Admitting: Podiatry

## 2024-08-16 DIAGNOSIS — M79671 Pain in right foot: Secondary | ICD-10-CM

## 2024-08-16 DIAGNOSIS — L97512 Non-pressure chronic ulcer of other part of right foot with fat layer exposed: Secondary | ICD-10-CM

## 2024-08-16 DIAGNOSIS — E1142 Type 2 diabetes mellitus with diabetic polyneuropathy: Secondary | ICD-10-CM

## 2024-08-16 DIAGNOSIS — B351 Tinea unguium: Secondary | ICD-10-CM | POA: Diagnosis not present

## 2024-08-16 DIAGNOSIS — M21961 Unspecified acquired deformity of right lower leg: Secondary | ICD-10-CM

## 2024-08-16 DIAGNOSIS — D2371 Other benign neoplasm of skin of right lower limb, including hip: Secondary | ICD-10-CM

## 2024-08-16 DIAGNOSIS — Z89432 Acquired absence of left foot: Secondary | ICD-10-CM

## 2024-08-16 DIAGNOSIS — M216X1 Other acquired deformities of right foot: Secondary | ICD-10-CM

## 2024-08-16 DIAGNOSIS — M2011 Hallux valgus (acquired), right foot: Secondary | ICD-10-CM

## 2024-08-16 NOTE — Telephone Encounter (Signed)
 Yes will do

## 2024-08-16 NOTE — Progress Notes (Signed)
 Troy [name] presents today chief complaint of ulcerative lesion to the medial aspect of his first metatarsal phalangeal joint which Dr. Jerona Sage is taking care of.  He is also complaining of painful elongated nails of the right foot and a painful lesion on the bottom of the right foot.  Objective: Pulses to the right lower extremity are barely palpable.  Ulcerative lesion which appears to be healing to the medial aspect of the head of the first metatarsal right foot.  Also demonstrates hammertoe deformities and a preulcerative lesion to the plantar aspect of the right foot beneath the second metatarsal which I debrided today and demonstrated near ulcerative conditions.  Toenails are long thick yellow dystrophic like mycotic 1 through 5 of the right foot.  Assessment: Diabetes mellitus with severe diabetic peripheral neuropathy hammertoe deformities ulcerative and preulcerative lesions right foot.  History of above-knee amputation left leg  Plan: Debrided preulcerative lesion today debrided nails 1 through 5.  And we are going to request that he obtain a pair of diabetic shoes with diabetic insoles.

## 2024-08-16 NOTE — Telephone Encounter (Signed)
 Patient spoke with Meryle Sat and was advised to have provider fax referral, along with a copy of patient's most recent visit

## 2024-08-23 ENCOUNTER — Ambulatory Visit: Admitting: Family

## 2024-08-23 NOTE — Telephone Encounter (Signed)
 The patient called back in today because Troy [name] is still requiring a prescription and the after visit notes from his last visit with Dr. Verta. He said he spoke withthem this morning and was told that they had not rcvd anything for him.

## 2024-08-30 ENCOUNTER — Ambulatory Visit: Admitting: Family

## 2024-08-31 ENCOUNTER — Encounter: Payer: Self-pay | Admitting: Family

## 2024-08-31 ENCOUNTER — Ambulatory Visit: Admitting: Family

## 2024-08-31 DIAGNOSIS — E08621 Diabetes mellitus due to underlying condition with foot ulcer: Secondary | ICD-10-CM

## 2024-08-31 DIAGNOSIS — L97401 Non-pressure chronic ulcer of unspecified heel and midfoot limited to breakdown of skin: Secondary | ICD-10-CM | POA: Diagnosis not present

## 2024-08-31 NOTE — Progress Notes (Signed)
 "  Office Visit Note   Patient: Troy [name]           Date of Birth: 09-Feb-1966           MRN: [id number] Visit Date: 08/31/2024              Requested by: Rexanne Ingle, MD [address] [place],  [place] [address] PCP: Rexanne Ingle, MD  Chief Complaint  Patient presents with   Right Foot - Follow-up      HPI: The patient is a 56 seen in follow-up for ulceration over the MTP joint right foot he is also following with Dr. Laurance he has been using mupirocin  and moleskin donuts to offload the area  Reports has an upcoming appointment for new custom orthotics and extra-depth shoes  Assessment & Plan: Visit Diagnoses: No diagnosis found.  Plan: Will follow-up in office with us  as needed continue with mupirocin  dressing changes offloading the ulcer to the area continue with protective shoe wear. he will follow-up with Dr. Verta  Follow-Up Instructions: No follow-ups on file.   Ortho Exam  Patient is alert, oriented, no adenopathy, well-dressed, normal affect, normal respiratory effort. On examination right foot there is no edema no erythema some mild peeling skin globally  The ulcer over the MTP joint of the right great toe is superficial this is stable at 5 mm in diameter with granulation in the wound bed there is no surrounding maceration or drainage or erythema or warmth   Imaging: No results found. No images are attached to the encounter.  Labs: Lab Results  Component Value Date   HGBA1C 6.5 (H) 02/02/2016   HGBA1C 7.0 (H) 11/09/2013   HGBA1C 11.0 (H) 07/19/2013   LABURIC 6.5 02/06/2016   REPTSTATUS 02/03/2016 FINAL 02/02/2016   CULT NO GROWTH 02/02/2016     Lab Results  Component Value Date   ALBUMIN 3.8 10/19/2022   ALBUMIN 3.9 06/15/2022   ALBUMIN 4.0 06/14/2022    No results found for: MG No results found for: VD25OH  No results found for: PREALBUMIN    Latest Ref Rng & Units 10/27/2022    8:28 PM 10/19/2022    7:33 PM  06/15/2022   12:09 PM  CBC EXTENDED  WBC 4.0 - 10.5 K/uL 9.0  8.7  7.0   RBC 4.22 - 5.81 MIL/uL 4.83  5.28  5.45   Hemoglobin 13.0 - 17.0 g/dL 85.4  84.0  83.6   HCT 39.0 - 52.0 % 42.0  45.8  47.4   Platelets 150 - 400 K/uL 167  169  157   NEUT# 1.7 - 7.7 K/uL   3.2   Lymph# 0.7 - 4.0 K/uL   3.0      There is no height or weight on file to calculate BMI.  Orders:  No orders of the defined types were placed in this encounter.  No orders of the defined types were placed in this encounter.    Procedures: No procedures performed  Clinical Data: No additional findings.  ROS:  All other systems negative, except as noted in the HPI. Review of Systems  Objective: Vital Signs: There were no vitals taken for this visit.  Specialty Comments:  No specialty comments available.  PMFS History: Patient Active Problem List   Diagnosis Date Noted   Acute deep vein thrombosis (DVT) of tibial vein of right lower extremity (HCC) 03/11/2023   History of colonic polyps 04/23/2021   Gout 09/25/2020   Morbid obesity (  HCC) 09/25/2020   Painful amputation stump (HCC) 07/01/2019   Hyperlipidemia 09/07/2018   Diabetic foot ulcer (HCC) 09/07/2018   History of left below knee amputation (HCC) 03/26/2018   Dysphagia 09/01/2017   Special screening for malignant neoplasms, colon 09/01/2017   Esophageal reflux 09/01/2017   Acute osteomyelitis of left foot (HCC)    Diabetic foot (HCC) 02/02/2016   Osteomyelitis (HCC) 02/02/2016   Acute osteomyelitis of left foot (HCC) 02/02/2016   CKD (chronic kidney disease), stage II 02/02/2016   Anemia 02/02/2016   Kidney stones 11/30/2013   Cholelithiasis: PER CT ABD/PELVIS AND ABD US  11/30/13 11/30/2013   Acute renal failure (HCC) 11/29/2013   Acute pancreatitis 11/29/2013   Asthma exacerbation 11/09/2013   CAP (community acquired pneumonia) 07/18/2013   Diabetes (HCC) 07/18/2013   Hypertension    Past Medical History:  Diagnosis Date   Asthma     Bronchitis    CKD (chronic kidney disease)    Diabetes mellitus    Diabetic Charcot foot (HCC)    Left   Gout    Hypertension    Kidney stones 11/30/2013   Neuromuscular disorder (HCC)    Obesity    Pancreatitis    Polyneuropathy in diabetes (HCC)    Sleep apnea     Family History  Problem Relation Age of Onset   Diabetes Mellitus II Father    Diabetes Father    Hypertension Father    Diabetes Mother    Hypertension Mother    CAD Neg Hx     Past Surgical History:  Procedure Laterality Date   AMPUTATION Left 02/06/2016   Procedure: AMPUTATION BELOW KNEE;  Surgeon: Oneil JAYSON Herald, MD;  Location: [facility] OR;  Service: Orthopedics;  Laterality: Left;   CHOLECYSTECTOMY N/A 12/03/2013   Procedure: LAPAROSCOPIC CHOLECYSTECTOMY WITH INTRAOPERATIVE CHOLANGIOGRAM;  Surgeon: Donnice Bury, MD;  Location: [facility] OR;  Service: General;  Laterality: N/A;   COLONOSCOPY WITH PROPOFOL  N/A 09/01/2017   Procedure: COLONOSCOPY WITH PROPOFOL ;  Surgeon: Dianna Specking, MD;  Location: [facility];  Service: Endoscopy;  Laterality: N/A;   COLONOSCOPY WITH PROPOFOL  N/A 04/23/2021   Procedure: COLONOSCOPY WITH PROPOFOL ;  Surgeon: Dianna Specking, MD;  Location: [facility];  Service: Endoscopy;  Laterality: N/A;   ESOPHAGOGASTRODUODENOSCOPY (EGD) WITH PROPOFOL  N/A 09/01/2017   Procedure: ESOPHAGOGASTRODUODENOSCOPY (EGD) WITH PROPOFOL ;  Surgeon: Dianna Specking, MD;  Location: [facility];  Service: Endoscopy;  Laterality: N/A;   NO PAST SURGERIES     POLYPECTOMY  04/23/2021   Procedure: POLYPECTOMY;  Surgeon: Dianna Specking, MD;  Location: [facility];  Service: Endoscopy;;   Social History   Occupational History   Not on file  Tobacco Use   Smoking status: Never   Smokeless tobacco: Never  Vaping Use   Vaping status: Never Used  Substance and Sexual Activity   Alcohol use: Yes    Comment: occasionally   Drug use: No   Sexual activity: Not on file       "

## 2024-11-15 ENCOUNTER — Ambulatory Visit: Admitting: Podiatry
